# Patient Record
Sex: Female | Born: 1935 | Race: White | Hispanic: No | Marital: Married | Smoking: Never smoker
Health system: Southern US, Community
[De-identification: ages and names within clinical notes are randomized; demographics above are authoritative.]

## PROBLEM LIST (undated history)

## (undated) DIAGNOSIS — I5032 Chronic diastolic (congestive) heart failure: Secondary | ICD-10-CM

## (undated) DIAGNOSIS — I341 Nonrheumatic mitral (valve) prolapse: Secondary | ICD-10-CM

## (undated) DIAGNOSIS — M1990 Unspecified osteoarthritis, unspecified site: Secondary | ICD-10-CM

## (undated) DIAGNOSIS — K219 Gastro-esophageal reflux disease without esophagitis: Secondary | ICD-10-CM

## (undated) DIAGNOSIS — I440 Atrioventricular block, first degree: Secondary | ICD-10-CM

## (undated) DIAGNOSIS — M5136 Other intervertebral disc degeneration, lumbar region: Secondary | ICD-10-CM

## (undated) DIAGNOSIS — I340 Nonrheumatic mitral (valve) insufficiency: Secondary | ICD-10-CM

## (undated) DIAGNOSIS — E785 Hyperlipidemia, unspecified: Secondary | ICD-10-CM

## (undated) DIAGNOSIS — M51369 Other intervertebral disc degeneration, lumbar region without mention of lumbar back pain or lower extremity pain: Secondary | ICD-10-CM

## (undated) DIAGNOSIS — J449 Chronic obstructive pulmonary disease, unspecified: Secondary | ICD-10-CM

## (undated) HISTORY — DX: Nonrheumatic mitral (valve) insufficiency: I34.0

## (undated) HISTORY — PX: ABDOMINAL HYSTERECTOMY: SHX81

## (undated) HISTORY — PX: TONSILLECTOMY: SUR1361

## (undated) HISTORY — DX: Nonrheumatic mitral (valve) prolapse: I34.1

## (undated) HISTORY — DX: Other intervertebral disc degeneration, lumbar region: M51.36

## (undated) HISTORY — DX: Other intervertebral disc degeneration, lumbar region without mention of lumbar back pain or lower extremity pain: M51.369

## (undated) HISTORY — DX: Unspecified osteoarthritis, unspecified site: M19.90

## (undated) HISTORY — DX: Chronic obstructive pulmonary disease, unspecified: J44.9

## (undated) HISTORY — DX: Gastro-esophageal reflux disease without esophagitis: K21.9

## (undated) HISTORY — PX: DILATION AND CURETTAGE OF UTERUS: SHX78

## (undated) HISTORY — DX: Chronic diastolic (congestive) heart failure: I50.32

## (undated) HISTORY — DX: Atrioventricular block, first degree: I44.0

## (undated) HISTORY — PX: APPENDECTOMY: SHX54

## (undated) HISTORY — PX: OTHER SURGICAL HISTORY: SHX169

## (undated) HISTORY — DX: Hyperlipidemia, unspecified: E78.5

## (undated) HISTORY — PX: HYSTEROTOMY: SHX1776

---

## 2005-01-30 ENCOUNTER — Ambulatory Visit: Payer: Self-pay | Admitting: Family Medicine

## 2005-08-06 ENCOUNTER — Ambulatory Visit: Payer: Self-pay | Admitting: Cardiology

## 2005-08-21 ENCOUNTER — Ambulatory Visit: Payer: Self-pay

## 2005-09-06 ENCOUNTER — Ambulatory Visit: Payer: Self-pay | Admitting: Cardiology

## 2006-02-19 ENCOUNTER — Ambulatory Visit: Payer: Self-pay | Admitting: Family Medicine

## 2006-04-08 ENCOUNTER — Ambulatory Visit: Payer: Self-pay | Admitting: Cardiology

## 2006-05-09 ENCOUNTER — Ambulatory Visit: Payer: Self-pay | Admitting: Cardiology

## 2006-10-22 ENCOUNTER — Ambulatory Visit: Payer: Self-pay | Admitting: Cardiology

## 2006-11-18 ENCOUNTER — Ambulatory Visit: Payer: Self-pay | Admitting: Family Medicine

## 2006-11-22 ENCOUNTER — Ambulatory Visit: Payer: Self-pay

## 2007-04-29 ENCOUNTER — Ambulatory Visit: Payer: Self-pay | Admitting: Cardiology

## 2007-05-06 ENCOUNTER — Ambulatory Visit: Payer: Self-pay | Admitting: Family Medicine

## 2008-01-22 ENCOUNTER — Ambulatory Visit: Payer: Self-pay | Admitting: Cardiology

## 2008-02-03 ENCOUNTER — Encounter: Payer: Self-pay | Admitting: Cardiology

## 2008-02-03 ENCOUNTER — Ambulatory Visit: Payer: Self-pay

## 2008-02-17 ENCOUNTER — Ambulatory Visit: Payer: Self-pay | Admitting: Cardiology

## 2008-05-27 ENCOUNTER — Ambulatory Visit: Payer: Self-pay | Admitting: Family Medicine

## 2008-05-31 ENCOUNTER — Emergency Department: Payer: Self-pay | Admitting: Internal Medicine

## 2008-09-28 ENCOUNTER — Ambulatory Visit: Payer: Self-pay | Admitting: Cardiology

## 2008-10-19 ENCOUNTER — Ambulatory Visit: Payer: Self-pay

## 2008-10-19 ENCOUNTER — Encounter: Payer: Self-pay | Admitting: Cardiology

## 2009-04-12 ENCOUNTER — Ambulatory Visit: Payer: Self-pay | Admitting: Cardiology

## 2009-04-12 DIAGNOSIS — I080 Rheumatic disorders of both mitral and aortic valves: Secondary | ICD-10-CM | POA: Insufficient documentation

## 2009-04-12 DIAGNOSIS — K219 Gastro-esophageal reflux disease without esophagitis: Secondary | ICD-10-CM | POA: Insufficient documentation

## 2009-04-12 DIAGNOSIS — I059 Rheumatic mitral valve disease, unspecified: Secondary | ICD-10-CM

## 2009-04-12 DIAGNOSIS — I319 Disease of pericardium, unspecified: Secondary | ICD-10-CM | POA: Insufficient documentation

## 2009-08-09 ENCOUNTER — Encounter (INDEPENDENT_AMBULATORY_CARE_PROVIDER_SITE_OTHER): Payer: Self-pay | Admitting: *Deleted

## 2009-09-13 ENCOUNTER — Ambulatory Visit: Payer: Self-pay | Admitting: Family Medicine

## 2009-10-06 ENCOUNTER — Ambulatory Visit: Payer: Self-pay | Admitting: Family Medicine

## 2009-11-15 ENCOUNTER — Ambulatory Visit: Payer: Self-pay | Admitting: Cardiology

## 2009-11-15 DIAGNOSIS — R0602 Shortness of breath: Secondary | ICD-10-CM | POA: Insufficient documentation

## 2009-12-06 ENCOUNTER — Encounter: Payer: Self-pay | Admitting: Nurse Practitioner

## 2009-12-13 ENCOUNTER — Ambulatory Visit: Payer: Self-pay

## 2009-12-13 ENCOUNTER — Ambulatory Visit (HOSPITAL_COMMUNITY): Admission: RE | Admit: 2009-12-13 | Discharge: 2009-12-13 | Payer: Self-pay | Admitting: Cardiology

## 2009-12-13 ENCOUNTER — Ambulatory Visit: Payer: Self-pay | Admitting: Cardiovascular Disease

## 2009-12-13 ENCOUNTER — Encounter: Payer: Self-pay | Admitting: Cardiology

## 2009-12-19 ENCOUNTER — Telehealth: Payer: Self-pay | Admitting: Cardiology

## 2009-12-21 ENCOUNTER — Ambulatory Visit: Payer: Self-pay | Admitting: Gastroenterology

## 2010-01-03 ENCOUNTER — Encounter: Payer: Self-pay | Admitting: Nurse Practitioner

## 2010-01-12 ENCOUNTER — Ambulatory Visit: Payer: Self-pay | Admitting: Gastroenterology

## 2010-01-12 ENCOUNTER — Encounter: Payer: Self-pay | Admitting: Gastroenterology

## 2010-01-24 ENCOUNTER — Ambulatory Visit: Payer: Self-pay | Admitting: Gastroenterology

## 2010-01-24 ENCOUNTER — Encounter: Payer: Self-pay | Admitting: Nurse Practitioner

## 2010-01-27 ENCOUNTER — Encounter: Payer: Self-pay | Admitting: Nurse Practitioner

## 2010-02-02 ENCOUNTER — Ambulatory Visit: Payer: Self-pay | Admitting: Gastroenterology

## 2010-02-02 ENCOUNTER — Encounter: Payer: Self-pay | Admitting: Nurse Practitioner

## 2010-03-07 ENCOUNTER — Encounter: Payer: Self-pay | Admitting: Nurse Practitioner

## 2010-05-15 ENCOUNTER — Encounter: Payer: Self-pay | Admitting: Cardiology

## 2010-05-16 ENCOUNTER — Ambulatory Visit: Payer: Self-pay | Admitting: Cardiology

## 2010-05-16 DIAGNOSIS — R1011 Right upper quadrant pain: Secondary | ICD-10-CM | POA: Insufficient documentation

## 2010-05-17 ENCOUNTER — Telehealth (INDEPENDENT_AMBULATORY_CARE_PROVIDER_SITE_OTHER): Payer: Self-pay

## 2010-05-17 ENCOUNTER — Encounter: Payer: Self-pay | Admitting: Nurse Practitioner

## 2010-05-25 ENCOUNTER — Telehealth: Payer: Self-pay | Admitting: Nurse Practitioner

## 2010-05-30 ENCOUNTER — Ambulatory Visit: Payer: Self-pay | Admitting: Nurse Practitioner

## 2010-06-06 ENCOUNTER — Ambulatory Visit (HOSPITAL_COMMUNITY): Admission: RE | Admit: 2010-06-06 | Discharge: 2010-06-06 | Payer: Self-pay | Admitting: Gastroenterology

## 2010-06-14 ENCOUNTER — Ambulatory Visit: Payer: Self-pay | Admitting: Gastroenterology

## 2010-06-30 ENCOUNTER — Telehealth (INDEPENDENT_AMBULATORY_CARE_PROVIDER_SITE_OTHER): Payer: Self-pay | Admitting: *Deleted

## 2010-10-04 ENCOUNTER — Ambulatory Visit: Payer: Self-pay | Admitting: Ophthalmology

## 2010-10-17 ENCOUNTER — Ambulatory Visit: Payer: Self-pay

## 2010-10-17 ENCOUNTER — Ambulatory Visit (HOSPITAL_COMMUNITY): Admission: RE | Admit: 2010-10-17 | Discharge: 2010-10-17 | Payer: Self-pay | Admitting: Cardiology

## 2010-10-17 ENCOUNTER — Encounter: Payer: Self-pay | Admitting: Cardiology

## 2010-10-17 ENCOUNTER — Ambulatory Visit: Payer: Self-pay | Admitting: Cardiology

## 2010-11-07 ENCOUNTER — Ambulatory Visit: Payer: Self-pay | Admitting: Family Medicine

## 2011-01-09 NOTE — Assessment & Plan Note (Signed)
Summary: 6 month rov need echo sameday/sl   Visit Type:  Follow-up Referring Provider:  na Primary Provider:  Cay Schillings, MD  CC:  MVP and MR.  History of Present Illness: The patient presents for followup of mitral valve prolapse with regurgitation. She had an echo done this morning which demonstrates an EF of 50-55% with mild to moderate regurgitation and moderate prolapse. She denies any symptoms such as shortness of breath, PND or orthopnea. She has no palpitations, presyncope or syncope. He denies any chest pressure, neck or arm discomfort. She has had no weight gain or edema. She does try to stay active and walks for exercise.  Current Medications (verified): 1)  Aspirin Ec 325 Mg Tbec (Aspirin) .... Take One Tablet By Mouth Daily 2)  Metoprolol Tartrate 25 Mg Tabs (Metoprolol Tartrate) .... Take 1/2 Tab  Two Times A Day 3)  Fosamax 70 Mg Tabs (Alendronate Sodium) .... Take Once A Week 4)  Oyster Calcium 500 Mg Tabs (Oyster Shell) .... One Tablet By Mouth Two Times A Day 5)  Multivitamins   Tabs (Multiple Vitamin) .Marland Kitchen.. 1 Po Daily  Allergies (verified): 1)  ! Penicillin V Potassium (Penicillin V Potassium)  Past History:  Past Medical History: Mitral prolapse with mild mild-to-moderate mitral  regurgitation Gastroesophageal reflux disease,  Pericardial effusion in the past (none on the recent echo) Miild osteoporosis.  Arthritis  Past Surgical History: Reviewed history from 04/12/2009 and no changes required. D&C, Right eye surgery Partial hysterectomy Right breast biopsy Tonsilectomy  Review of Systems       As stated in the HPI and negative for all other systems.   Vital Signs:  Patient profile:   77 year old female Height:      60 inches Weight:      103 pounds BMI:     20.19 Pulse rate:   53 / minute Resp:     16 per minute BP sitting:   112 / 68  (right arm)  Vitals Entered By: Marrion Coy, CNA (October 17, 2010 8:42 AM)  Physical Exam  General:   Well developed, well nourished, no acute distress. Head:  Normocephalic and atraumatic. Neck:  Large, soft lesion left neck with consistency of lipoma  Chest Wall:  no deformities or breast masses noted Lungs:  Clear throughout to auscultation. Abdomen:  Abdomen soft, nontender, nondistended. No obvious masses or hepatomegaly.Normal bowel sounds.  Msk:  Symmetrical with no gross deformities. Normal posture, scoliosis Extremities:  No clubbing or cyanosis. Neurologic:  Alert and oriented x 3. Cervical Nodes:  no significant adenopathy Psych:  Normal affect.   Detailed Cardiovascular Exam  Neck    Carotids: Carotids full and equal bilaterally without bruits.      Neck Veins: Normal, no JVD.    Heart    Inspection: no deformities or lifts noted.      Palpation: normal PMI with no thrills palpable.      Auscultation: S1 and S2 within normal limits, no S3, no S4, 3/6 systolic murmur at the apex and radiating to the axilla, holosystolic, mitral valve prolapse click, no rubs  Vascular    Abdominal Aorta: no palpable masses, pulsations, or audible bruits.      Femoral Pulses: normal femoral pulses bilaterally.      Pedal Pulses: pulses normal in all 4 extremities    Radial Pulses: normal radial pulses bilaterally.      Peripheral Circulation: no clubbing, cyanosis, or edema noted with normal capillary refill.  EKG  Procedure date:  10/17/2010  Findings:      Sinus rhythm, rate 57, left anterior fascicular block, poor anterior R-wave progression  Impression & Recommendations:  Problem # 1:  MITRAL VALVE PROLAPSE (ICD-424.0) Her prolapse seems to be unchanged. Her EF is upper limits of normal. The regurgitation is unchanged. She has no symptoms. I will follow this in one year.  Problem # 2:  PERICARDIAL EFFUSION (ICD-423.9) There is again no evidence of this on this exam. No further evaluation is needed.  Other Orders: EKG w/ Interpretation (93000) Echocardiogram  (Echo)  Patient Instructions: 1)  Your physician recommends that you schedule a follow-up appointment in: 12 months withDr Ermias Tomeo 2)  Your physician recommends that you continue on your current medications as directed. Please refer to the Current Medication list given to you today. 3)  Your physician has requested that you have an echocardiogram in one year.  Echocardiography is a painless test that uses sound waves to create images of your heart. It provides your doctor with information about the size and shape of your heart and how well your heart's chambers and valves are working.  This procedure takes approximately one hour. There are no restrictions for this procedure.

## 2011-01-09 NOTE — Procedures (Signed)
Summary: ERCP/[facility]  ERCP/[facility]   Imported By: Sherian Rein 06/19/2010 08:04:02  _____________________________________________________________________  External Attachment:    Type:   Image     Comment:   External Document

## 2011-01-09 NOTE — Progress Notes (Signed)
  Phone Note Other Incoming   Request: Send information Summary of Call: Records received from [facility]. 24 pages forwarded to Dr. Christella Hartigan for review.

## 2011-01-09 NOTE — Progress Notes (Signed)
Summary: ? re if old records were faxed  Phone Note Call from Patient Call back at [facility] Phone [phone]   Caller: Patient Call For: Myan Suit Reason for Call: Talk to Nurse Summary of Call: Patient wants to know if we received her old records so the it can be reviewed before her appt Tues. with Gunnar Fusi Initial call taken by: Tawni Levy,  May 25, 2010 10:50 AM  Follow-up for Phone Call        Called pt to find out what records she was referring to she states that she has a GI MD at [facility] clinic and she was coming here for a second opinion and she wanted to know if we have recieved her GI records. I asked when the last time she has seen the GI MD at [facility] she told me she saw him 2-3 months ago but wanted to see what another MD would say about her. I advised her that we do not give second opioions and that if she has seen anothe GI MD within a year our practice policy was to have all of the previous GI records faxed over to our GI MD for review and once he has reviewed them we will give her a call back about setting up the soonest appt if her accepts her into the practice. I also advised her I was cxing her apt with Gunnar Fusi and we would make sure to give her the soonest appt we could if she is accepted into the practice.  Follow-up by: Harlow Mares CMA Duncan Dull),  May 25, 2010 5:18 PM     Appended Document: ? re if old records were faxed called pt back and added back to Laruth Hanger guenthers schedule oked per joan. advised pt we have not yet got recoreds she should call [facility] and have them faxed to [phone] before her appt  Appended Document: ? re if old records were faxed Spoke with Pt this am she was asking for me to fax Records over to [facility], I advised her we do not have to fax records over to [facility], per the Phone note from 6/16 the records were supposed to come form the GI MD @ [facility]..before they will accept her onto the Dept.I advised Pt to call [facility] and ask them to fax her records over so she can make an appt.Pt hung up phone.    Appended Document: ? re if old records were faxed Recieved Records from [facility] today, called and spoke with Britta Mccreedy on GI I faxed the records over to her. @ [phone]..so they can make PT appt. also kept copy here will forward to [place].

## 2011-01-09 NOTE — Letter (Signed)
Summary: [facility]   Imported By: Sherian Rein 06/19/2010 08:15:38  _____________________________________________________________________  External Attachment:    Type:   Image     Comment:   External Document

## 2011-01-09 NOTE — Letter (Signed)
Summary: New Patient letter  [facility]  [address] [place], [place] [address]   Phone: [phone]  Fax: [phone]       05/17/2010 MRN: [id number]  [facility] [address] [place], [place]  [address]  Dear Ms. Roehrig,  Welcome to the Gastroenterology Division at [facility].    You are scheduled to see Willette Cluster RNP  on 05/30/10 at 1:30 on the 3rd floor at [facility], [address]  We ask that you try to arrive at our office 15 minutes prior to your appointment time to allow for check-in.  We would like you to complete the enclosed self-administered evaluation form prior to your visit and bring it with you on the day of your appointment.  We will review it with you.  Also, please bring a complete list of all your medications or, if you prefer, bring the medication bottles and we will list them.  Please bring your insurance card so that we may make a copy of it.  If your insurance requires a referral to see a specialist, please bring your referral form from your primary care physician.  Co-payments are due at the time of your visit and may be paid by cash, check or credit card.     Your office visit will consist of a consult with your physician (includes a physical exam), any laboratory testing he/she may order, scheduling of any necessary diagnostic testing (e.g. x-ray, ultrasound, CT-scan), and scheduling of a procedure (e.g. Endoscopy, Colonoscopy) if required.  Please allow enough time on your schedule to allow for any/all of these possibilities.    If you cannot keep your appointment, please call [phone] to cancel or reschedule prior to your appointment date.  This allows [place] the opportunity to schedule an appointment for another patient in need of care.  If you do not cancel or reschedule by 5 p.m. the business day prior to your appointment date, you will be charged a $50.00 late cancellation/no-show fee.    Thank you for choosing [facility] for your medical needs.  We appreciate the opportunity to care for you.  Please visit [place] at our website  to learn more about our practice.                     Sincerely,                                                             The Gastroenterology Division

## 2011-01-09 NOTE — Miscellaneous (Signed)
  Clinical Lists Changes  Observations: Added new observation of ECHOINTERP:  - Left ventricle: The cavity size was normal. Wall thickness was       normal. Systolic function was normal. The estimated ejection       fraction was in the range of 55% to 60%. Wall motion was normal;       there were no regional wall motion abnormalities. Left ventricular       diastolic function parameters were normal.     - Aortic valve: Trivial regurgitation.     - Mitral valve: Moderate prolapse, involving the anterior leaflet       and the posterior leaflet. Moderate regurgitation.     - Left atrium: The atrium was moderately dilated.     - Right ventricle: The cavity size was mildly dilated.     - Right atrium: The atrium was moderately dilated. (12/13/2009 8:43)      Echocardiogram  Procedure date:  12/13/2009  Findings:       - Left ventricle: The cavity size was normal. Wall thickness was       normal. Systolic function was normal. The estimated ejection       fraction was in the range of 55% to 60%. Wall motion was normal;       there were no regional wall motion abnormalities. Left ventricular       diastolic function parameters were normal.     - Aortic valve: Trivial regurgitation.     - Mitral valve: Moderate prolapse, involving the anterior leaflet       and the posterior leaflet. Moderate regurgitation.     - Left atrium: The atrium was moderately dilated.     - Right ventricle: The cavity size was mildly dilated.     - Right atrium: The atrium was moderately dilated.

## 2011-01-09 NOTE — Assessment & Plan Note (Signed)
Summary: abdominal pain Second opinion/lk   History of Present Illness Visit Type: Initial Consult Primary GI MD: Rob Bunting MD Primary Provider: Dr. Cay Schillings Requesting Provider: Rollene Rotunda, MD Chief Complaint: Rt. sided abdominal pain, second opinion History of Present Illness:   Jennifer Skinner is here with her husband for a second opinion regarding RUQ pain. In December 2010 patient saw Dr. Lutricia Feil at the [facility] in [place]. She subsequently had an EGD and colonoscopy for evaluation of abdominal pain and change in bowel habits. EGD normal. Colonoscopy pertinent for a sessile cecal polyp and multiple diverticula in sigmoid. For further workup patient underwent U/S of abdomen which was normal. Labs revealed mildly elevated Amylase and Lipase of 151 and 56, respectively. LFTs and CBC were normal.  CTscan without contrast suggested two hepatic hemangiomas ( I don't have actual report). For persistent pain and mildly elevated pancreatic enzymes patient had MRCP revealing CBD dilation and pancreatic duct dilation. No definite ampullary mass but there was abrupt termination of CBD and pancreatic ducts. Gallbladder distended, indeterminate nodules scattered throughtout the liver. 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 Bilary tree swept with balloon but nothing found. Sphincterotomy was done.  Patient still continues to have abdominal discomfort, mainly RUQ but sometimes across right upper abdomen.  She has post-prandial bloating as well. Recently had problems with constipation as well but that has improved with stool softeners. Patient wondered whether stool softeners were causing her pain so she stopped them for three days with no improvement in pain. No nausea except that associated with motion sickness. No weight loss. No dark urine or pruritis.  Her pain is not severe, but patient feels like something isn't right.  Marland Kitchen    GI Review of Systems    Reports abdominal pain and  bloating.     Location of  Abdominal pain: right side.    Denies acid reflux, belching, chest pain, dysphagia with liquids, dysphagia with solids, heartburn, loss of appetite, nausea, vomiting, vomiting blood, weight loss, and  weight gain.      Reports change in bowel habits and  constipation.     Denies anal fissure, black tarry stools, diarrhea, diverticulosis, fecal incontinence, heme positive stool, hemorrhoids, irritable bowel syndrome, jaundice, light color stool, liver problems, rectal bleeding, and  rectal pain. Preventive Screening-Counseling & Management      Drug Use:  no.      Current Medications (verified): 1)  Aspirin Ec 325 Mg Tbec (Aspirin) .... Take One Tablet By Mouth Daily 2)  Metoprolol Tartrate 25 Mg Tabs (Metoprolol Tartrate) .... Take 1/2 Tab  Two Times A Day 3)  Fosamax 70 Mg Tabs (Alendronate Sodium) .... Take Once A Week 4)  Oyster Calcium 500 Mg Tabs (Oyster Shell) .Marland Kitchen.. 1 Podaily 5)  Multivitamins   Tabs (Multiple Vitamin) .Marland Kitchen.. 1 Po Daily  Allergies (verified): 1)  ! Penicillin V Potassium (Penicillin V Potassium)  Past History:  Past Medical History: Mitral prolapse with mild mild-to-moderate mitral  regurgitation Gastroesophageal reflux disease,  Pericardial effusion in the past (none on the recent echo) Miild osteoporosis.     Arthritis  Past Surgical History: Reviewed history from 04/12/2009 and no changes required. D&C, Right eye surgery Partial hysterectomy Right breast biopsy Tonsilectomy  Family History: Reviewed history from 04/12/2009 and no changes required. Father: MI @ 45 Mother: CVA @ 28  Social History: Retired  Married  2 children Tobacco Use - No.  Alcohol Use -  no Daily Caffeine Use Illicit Drug Use - no Drug Use:  no  Review of Systems       The patient complains of arthritis/joint pain and shortness of breath.  The patient denies allergy/sinus, anemia,  anxiety-new, back pain, blood in urine, breast changes/lumps, change in vision, confusion, cough, coughing up blood, depression-new, fainting, fatigue, fever, headaches-new, hearing problems, heart murmur, heart rhythm changes, itching, menstrual pain, muscle pains/cramps, night sweats, nosebleeds, pregnancy symptoms, skin rash, sleeping problems, sore throat, swelling of feet/legs, swollen lymph glands, thirst - excessive , urination - excessive , urination changes/pain, urine leakage, vision changes, and voice change.    Vital Signs:  Patient profile:   77 year old female Height:      60 inches Weight:      101.25 pounds BMI:     19.85 Pulse rate:   60 / minute Pulse rhythm:   regular BP sitting:   102 / 60  (left arm) Cuff size:   regular  Vitals Entered By: Jennifer Skinner CMA Duncan Dull) (Jennifer 21, 2011 1:54 PM)  Physical Exam  General:  Well developed, well nourished, no acute distress. Head:  Normocephalic and atraumatic. Eyes:  Conjunctiva pink, no icterus.  Mouth:  No oral lesions. Tongue moist.  Neck:  Large, soft lesion left neck with consistency of lipoma  Lungs:  Clear throughout to auscultation. Heart:  Regular rate and rhythm; no murmurs, rubs,  or bruits. Abdomen:  Abdomen soft, nontender, nondistended. No obvious masses or hepatomegaly.Normal bowel sounds.  Msk:  Symmetrical with no gross deformities. Normal posture. Extremities:  No palmar erythema, no edema.  Neurologic:  Alert and  oriented x4;  grossly normal neurologically. Skin:  Intact without significant lesions or rashes. Cervical Nodes:  No significant cervical adenopathy. Psych:  Alert and cooperative. Normal mood and affect.   Impression & Recommendations:  Problem # 1:  RUQ PAIN (ICD-789.01) Assessment Unchanged Chronic (since at least Dec. 2010). Patient has had extensive workup by Dr. Bluford Kaufmann in [place] (see HPI). 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 She continues to have this nagging RUQ discomfort.   Dr. Antoine Poche referred patient is here for a second opinion. She was adamant about being seen in clinic today but our gastroenterologists had no available appointments.  I reviewed records from [facility] and discussed case with Dr. Christella Hartigan. Will obtain MRI of the pancreas and have patient return for follow up with Dr. Christella Hartigan to discuss MRI results and plan of treatment.   Orders: MRI Abdomen (MRI Abdomen)    Problem # 2:  NONSPECIFIC ABN FINDNG RAD&OTH EXAM BILARY TRCT (ICD-793.3) Assessment: Comment Only See above.  Other Orders: TLB-Creatinine, Blood (82565-CREA) TLB-BUN (Urea Nitrogen) (84520-BUN)  Patient Instructions: 1)  We have schedueld the MRI at [facility] on 06-06-10 at [facility]. 2)  Directions given. 3)  Please go to the lab, basement level. 4)  We will call you with the MRI results when we have them.

## 2011-01-09 NOTE — Letter (Signed)
Summary: [facility]   Imported By: Sherian Rein 06/19/2010 08:14:02  _____________________________________________________________________  External Attachment:    Type:   Image     Comment:   External Document

## 2011-01-09 NOTE — Assessment & Plan Note (Signed)
Summary: per check out/sf   Visit Type:  Follow-up Primary Provider:  Dr. Cay Schillings  CC:  MVP.  History of Present Illness: The patient presents for followup of the above. She had an echocardiogram in December demonstrating mitral valve prolapse with moderate regurgitation unchanged from previous. She's had no new cardiovascular complaints. She doesn't have any palpitations, presyncope or syncope. She is not having any chest pressure, neck or arm discomfort. She has no PND or orthopnea or dyspnea on exertion though she occasionally feels like she has trouble taking a deep breath. She does complain of her right upper quadrant discomfort that has been persistent in going on for months. She reports an having had a GI evaluation with another group but having no clear diagnosis. Probably most concerned and is stressed by this discomfort.  Current Medications (verified): 1)  Aspirin Ec 325 Mg Tbec (Aspirin) .... Take One Tablet By Mouth Daily 2)  Metoprolol Tartrate 25 Mg Tabs (Metoprolol Tartrate) .... Take 1/2 Tab  Two Times A Day 3)  Fosamax 70 Mg Tabs (Alendronate Sodium) .... Take Once A Week 4)  Oyster Calcium 500 Mg Tabs (Oyster Shell) .Marland Kitchen.. 1 Podaily 5)  Multivitamins   Tabs (Multiple Vitamin) .Marland Kitchen.. 1 Po Daily  Allergies (verified): 1)  ! Penicillin V Potassium (Penicillin V Potassium)  Past History:  Past Medical History: Reviewed history from 04/12/2009 and no changes required. Mitral prolapse with mild mild-to-moderate mitral  regurgitation Gastroesophageal reflux disease,  Pericardial effusion in the past (none on the recent echo) Miild osteoporosis.      Past Surgical History: Reviewed history from 04/12/2009 and no changes required. D&C, Right eye surgery Partial hysterectomy Right breast biopsy Tonsilectomy  Review of Systems       As stated in the HPI and negative for all other systems   Vital Signs:  Patient profile:   77 year old female Height:      64  inches Weight:      101 pounds BMI:     17.40 Pulse rate:   62 / minute Resp:     16 per minute  Vitals Entered By: Marrion Coy, CNA (May 16, 2010 11:54 AM)  Serial Vital Signs/Assessments:  Time      Position  BP       Pulse  Resp  Temp     By           R Arm     122/70                         Marrion Coy, CNA           L Arm     118/68                         Marrion Coy, CNA   Physical Exam  General:  Well developed, well nourished, in no acute distress. Head:  normocephalic and atraumatic Eyes:  PERRLA/EOM intact; conjunctiva and lids normal. Mouth:  Teeth, gums and palate normal. Oral mucosa normal. Neck:  Neck supple, no JVD. No masses, thyromegaly or abnormal cervical nodes, soft 6 x 5 left anterior neck mass Chest Wall:  no deformities or breast masses noted Lungs:  Clear bilaterally to auscultation and percussion. Abdomen:  Bowel sounds positive; abdomen soft and non-tender without masses, organomegaly, or hernias noted. No hepatosplenomegaly. Msk:  Back normal, normal gait. Muscle strength and tone normal. Skin:  Intact  without lesions or rashes. Cervical Nodes:  no significant adenopathy Axillary Nodes:  no significant adenopathy Psych:  Normal affect.   Detailed Cardiovascular Exam  Neck    Carotids: Carotids full and equal bilaterally without bruits.      Neck Veins: Normal, no JVD.    Heart    Inspection: no deformities or lifts noted.      Palpation: normal PMI with no thrills palpable.      Auscultation: S1 and S2 within normal limits, no S3, no S4, 3/6 systolic murmur at the apex and radiating to the axilla, holosystolic, mitral valve prolapse click, no rubs  Vascular    Abdominal Aorta: no palpable masses, pulsations, or audible bruits.      Femoral Pulses: normal femoral pulses bilaterally.      Pedal Pulses: pulses normal in all 4 extremities    Radial Pulses: normal radial pulses bilaterally.      Peripheral Circulation: no clubbing, cyanosis,  or edema noted with normal capillary refill.     EKG  Procedure date:  05/16/2010  Findings:      Sinus rhythm, rate 60 to, right axis deviation, poor anterior R-wave progression, no acute ST-T wave change  Impression & Recommendations:  Problem # 1:  MITRAL REGURGITATION (ICD-396.3) Her recent echo demonstrated stable mitral valve prolapse and regurgitation. She understands endocarditis prophylaxis. I will repeat an echo in December.  Orders: Echocardiogram (Echo)  Problem # 2:  RUQ PAIN (ICD-789.01) She has requested a second opinion from one of our gastroenterologists. I will try to facilitate this. Orders: Gastroenterology Referral (GI)  Problem # 3:  DYSPNEA (ICD-786.05) She has some mild difficulty taking a deep breath but otherwise I think is doing well. I will make no changes to her medical regimen will suggest further testing.  Patient Instructions: 1)  Your physician recommends that you schedule a follow-up appointment in: 12/11 after Echo 2)  Your physician recommends that you continue on your current medications as directed. Please refer to the Current Medication list given to you today. 3)  Your physician has requested that you have an echocardiogram.  Echocardiography is a painless test that uses sound waves to create images of your heart. It provides your doctor with information about the size and shape of your heart and how well your heart's chambers and valves are working.  This procedure takes approximately one hour. There are no restrictions for this procedure. 4)  You have been referred to Gastroentrology for right upper quadrant pain

## 2011-01-09 NOTE — Assessment & Plan Note (Signed)
Review of gastrointestinal problems: 1. Chronic intermittent RUQ pains extensively worked up by Dr. Bluford Kaufmann in [place] in 2011: CT scan, MRCP, ERCP (biliary sphincterotomy), [place], HIDA scan normal, LFTs/CBC were normal, EGD/colonoscopy normal except for small polyp in colon.  Dr. Christella Hartigan ordered MRI abdomen that showed no pancreatic masses, +for several small liver hemangiomas, mild anorectal dilation, common bile that 7 mm.    History of Present Illness Visit Type: Follow-up Visit Primary GI MD: Rob Bunting MD Primary Provider: Cay Schillings, MD Requesting Provider: na Chief Complaint: MRI f/u  History of Present Illness:     77 year old woman who is here with her husband today.  who has had RUQ pains for at least 8-10 months.  Certain body movements can cause a pulling sensations. Intermittent but a bother every day.  Nothing reliably brings on the pain.  It can last for maybe about an hour.  Nothing reliably makes it better.  EAting does not tend to bring it on, however overeating causes a very full feeling.  She has noticed more constipation around the same time period.  She doesn't think that her symptoms are constipation related.   She is very sensitive to tight waistband.  Overall stable weight since this started.    takes daily ASA, but no other NSAIDs.              Current Medications (verified): 1)  Aspirin Ec 325 Mg Tbec (Aspirin) .... Take One Tablet By Mouth Daily 2)  Metoprolol Tartrate 25 Mg Tabs (Metoprolol Tartrate) .... Take 1/2 Tab  Two Times A Day 3)  Fosamax 70 Mg Tabs (Alendronate Sodium) .... Take Once A Week 4)  Oyster Calcium 500 Mg Tabs (Oyster Shell) .... One Tablet By Mouth Two Times A Day 5)  Multivitamins   Tabs (Multiple Vitamin) .Marland Kitchen.. 1 Po Daily  Allergies (verified): 1)  ! Penicillin V Potassium (Penicillin V Potassium)  Vital Signs:  Patient profile:   77 year old female Height:      60 inches Weight:      102 pounds BMI:     19.99 BSA:      1.40 Pulse rate:   60 / minute Pulse rhythm:   regular BP sitting:   100 / 60  (left arm) Cuff size:   regular  Vitals Entered By: Ok Anis CMA (June 14, 2010 10:18 AM)  Physical Exam  Additional Exam:  Constitutional: generally well appearing Psychiatric: alert and oriented times 3 Abdomen: soft, non-tender, non-distended, normal bowel sounds    Impression & Recommendations:  Problem # 1:  Unexplained right-sided abdominal pains she has had myriad GI testing over the past several months mainly under the care of Dr. Bluford Kaufmann.  None of these tests have explained to her right sided abdominal pains.  I explained to her that it is not clear that she has a gastrointestinal issue.  I do not think any further GI tests are warranted at this point unless any significant change in her symptoms occurs. I recommended she try proton pump inhibitor once daily to see if GERD may be playing a role. If that is not helpful after one month and I recommended daily Tylenol trial. She is very reluctant to take any medicines especially pain medicines even as minor as Tylenol for this pain.  Patient Instructions: 1)  No further GI testing is needed. 2)  Should consider taking tylenol 500mg  one to two pills twice a day. 3)  Return to PCP to consider other testing  for right sided pains. 4)  A copy of this information will be sent to Dr. Cay Schillings. 5)  A prescription for prilosec, one pill, once a day, 20-30 min before breakfast meal daily.  Need to try this for at least one month before judging if it helps. 6)  Follow up as needed with Dr. Christella Hartigan. 7)  The medication list was reviewed and reconciled.  All changed / newly prescribed medications were explained.  A complete medication list was provided to the patient / caregiver. Prescriptions: OMEPRAZOLE 20 MG  CPDR (OMEPRAZOLE) 1 each day 30 minutes before meal  #30 x 11   Entered and Authorized by:   Rachael Fee MD   Signed by:   Rachael Fee MD on  06/14/2010   Method used:   Electronically to        [facility]* (retail)       [address] [place], [place]  [address]       Ph: [phone]       Fax: [phone]   RxID:   [id number]

## 2011-01-09 NOTE — Progress Notes (Signed)
Summary: Schedule GI appt  Phone Note Outgoing Call Call back at [facility] Phone [phone]   Call placed by: Darcey Nora RN, CGRN,  May 17, 2010 10:55 AM Call placed to: Patient Summary of Call: I called to schedule an appointment for eval of her RUQ pain requested by Dr Antoine Poche.  Patient  was offered an appointment for today or one day this week, she declined.  Patient only wants to come on 05/30/10.  I have scheduled her an appointment with Willette Cluster RNP for 05/30/10 at 1:30.   Initial call taken by: Darcey Nora RN, CGRN,  May 17, 2010 10:57 AM

## 2011-01-09 NOTE — Letter (Signed)
Summary: GI/[facility]  GI/[facility]   Imported By: Sherian Rein 06/19/2010 08:06:57  _____________________________________________________________________  External Attachment:    Type:   Image     Comment:   External Document

## 2011-01-09 NOTE — Progress Notes (Signed)
Summary: results of echo  Phone Note Call from Patient Call back at Home Phone [phone]   Caller: Patient Reason for Call: Talk to Nurse, Talk to Doctor, Lab or Test Results Summary of Call: pt would like results of echo Initial call taken by: Omer Jack,  December 19, 2009 10:58 AM  Follow-up for Phone Call        pt given results Follow-up by: Meredith Staggers, RN,  December 19, 2009 11:22 AM

## 2011-04-24 NOTE — Assessment & Plan Note (Signed)
[facility]                            CARDIOLOGY OFFICE NOTE   Jennifer Skinner, Jennifer Skinner                       MRN:          [id number]  DATE:04/29/2007                            DOB:          1931-01-06    PRIMARY CARE PHYSICIAN:  Dr. Cay Schillings.   REASON FOR PRESENTATION:  Evaluate patient with mitral valve prolapse.   HISTORY OF PRESENT ILLNESS:  The patient returns for followup.  She is  77 years old.  She came today because her husband had an appointment.  She has had no problems.  She denied any palpitations.  She has not had  to take any higher dose of beta blocker.  She has had no presyncope or  syncope.  She has no shortness of breath or chest pain.   PAST MEDICAL HISTORY:  1. Mild mitral valve prolapse.  2. Gastroesophageal reflux disease.  3. Pericardial effusion in the past (none on the most recent      echocardiogram in 2006).  4. Mild mitral regurgitation.  5. Osteoporosis.   ALLERGIES:  PENICILLIN.   MEDICATIONS:  1. Aspirin 325 mg daily.  2. Fosamax 70 mg weekly.  3. Metoprolol 12.5 mg b.i.d.   REVIEW OF SYSTEMS:  As stated in the HPI.  Otherwise. negative for other  systems.   HISTORY OF PRESENT ILLNESS:  GENERAL:  The patient is in no distress.  VITAL SIGNS:  Blood pressure 109/69, heart rate 61 and regular, weight  101 pounds.  HEENT:  Eyes unremarkable.  Pupils equal, round, and reactive to light.  Fundi not visualized.  Oral mucosa unremarkable.  NECK:  No jugular venous distention.  Waveform within normal limits.  Carotid upstroke brisk and symmetric.  No bruits, no thyromegaly.  LYMPHATICS:  No adenopathy.  (She does have a soft tissue mass on her  left neck that has been evaluated.)  LUNGS:  Clear to auscultation.  BACK:  No costovertebral angle tenderness.  HEART:  PMI not displaced or sustained.  S1 and S2 within normal limits.  No S3.  Positive midsystolic click.  2/6 apical systolic murmur heard at  the axilla  and holosystolic.  No diastolic murmurs.  ABDOMEN:  Flat.  Positive bowel sounds.  Normal in frequency and pitch.  No bruits.  No rebound, no guarding.  No midline pulsatile mass, no  organomegaly.  SKIN:  No rashes, no nodules.  EXTREMITIES:  2+ pulses.  No edema.   EKG:  Rate 57, axis within normal limits, intervals within normal  limits.  No acute ST-T wave change.   ASSESSMENT AND PLAN:  1. Mitral valve prolapse.  The patient has no symptoms related to      this.  There is no suggestion that she has any increased      regurgitation.  No further cardiovascular testing is suggested.  2. Palpitations.  These are not particularly symptomatic.  She will      remain on the beta blocker.  3. Followup.  We can see her back in one year or sooner if needed.     Rollene Rotunda, MD, [facility]  Electronically Signed    JH/MedQ  DD: 04/29/2007  DT: 04/29/2007  Job #: [id number]

## 2011-04-24 NOTE — Assessment & Plan Note (Signed)
[facility] OFFICE NOTE   Jennifer Skinner, Jennifer Skinner                       MRN:          [id number]  DATE:02/17/2008                            DOB:          08/03/1931    PRIMARY CARE PHYSICIAN:  Cay Schillings, M.D.   REASON FOR PRESENTATION:  Evaluate patient with shortness of breath and  mitral valve prolapse.   HISTORY OF PRESENT ILLNESS:  The patient is 77 years old.  She added  herself back to the schedule, as she is still complaining of the above  symptoms.  I did have an echocardiogram done on February 03, 2008 to  follow up her mitral valve prolapse and regurgitation.  There was  moderate mitral valve prolapse with mild to moderate regurgitation.  The  ejection fraction was 55-60%.  There were no significant other  abnormalities.   Still, the patient gets episodic feelings like she needs to take a  deeper breath.  She is not describing chest discomfort, neck or arm  discomfort.  She is not describing PND or orthopnea.  She has occasional  palpitations, but these are not more problematic than they have been in  the past.  She does admit to feeling anxious and possibly depressed.  Her husband corroborates this.   PAST MEDICAL HISTORY:  1. Mitral valve prolapse with mild mitral regurgitation.  2. Gastroesophageal reflux disease.  3. Pericardial effusion in the past (none present on the recent echo).  4. Mild osteoporosis.   ALLERGIES:  PENICILLIN.   MEDICATIONS:  1. Aspirin 325 mg daily.  2. Fosamax 70 mg weekly.  3. Metoprolol 12.5 mg b.i.d.   REVIEW OF SYSTEMS:  As stated in the HPI, and otherwise negative for  other systems.   PHYSICAL EXAMINATION:  GENERAL:  The patient is in no distress.  VITAL SIGNS:  Blood pressure 109/66, heart rate 71 and regular, weight  101 pounds.  NECK:  No jugular distention at 45 degrees.  Carotid upstroke brisk and  symmetric.  No bruits, thyromegaly.  LYMPHATICS:  No  cervical, axillary, inguinal adenopathy.  LUNGS:  Clear to auscultation bilaterally.  BACK:  No costovertebral angle tenderness.  CHEST:  Unremarkable.  HEART:  PMI not displaced or sustained.  S1 and S2 within normal limits.  No S3.  Positive midsystolic click, 2/6 apical systolic murmur heard at  the axilla and holosystolic.  No diastolic murmurs.  ABDOMEN:  Flat, positive bowel sounds.  Normal in frequency and pitch.  No bruits, rebound, guarding.  No midline pulsatile mass, no  hepatomegaly or splenomegaly.  SKIN:  No rashes, no nodules.  EXTREMITIES:  2+ pulses, no edema.   ASSESSMENT/PLAN:  1. Dyspnea.  Again, I have discussed the patient's dyspnea with her.      I do not think this is related to the mitral regurgitation or      prolapse.  It sounds like she has difficulty, needing to sigh.  Her      husband says she is anxious during these episodes.  This may be an      element of  anxiety or depression.  The patient seems to agree with      this and wants to see Dr. Sheppard Penton to discuss therapy for this.  2. Mitral valve prolapse/regurgitation.  Will follow this clinically.  3. Palpitations.  These were infrequent, and no further therapy is      warranted.  4. Follow up.  I will see her back in 6 months or sooner if needed.     Rollene Rotunda, MD, [facility]  Electronically Signed    JH/MedQ  DD: 02/17/2008  DT: 02/18/2008  Job #: [id number]   cc:   Cay Schillings

## 2011-04-24 NOTE — Assessment & Plan Note (Signed)
[facility] OFFICE NOTE   KORRI, ASK                       MRN:          [id number]  DATE:01/22/2008                            DOB:          [birth year]    PRIMARY:  Dr. Cay Schillings.   REASON FOR PRESENTATION:  Evaluate the patient's shortness of breath and  mitral valve prolapse.   HISTORY OF PRESENT ILLNESS:  The patient is 77 years old.  She presents  for evaluation of mitral valve prolapse.  She says she had done  relatively well since I last saw her.  She does get occasional shortness  of breath.  She describes this as feeling like she cannot take a deep  breath.  She says that this is something that lasts just a very short  period of time.  Very episodic.  She says when she walks, which she does  routinely, she will get a little short of breath when she begins, but  this actually gets better as she goes along.  She does not describe any  limiting dyspnea.  She does not have any resting shortness of breath,  PND or orthopnea.  She does not describe any palpitations, presyncope or  syncope.  She do not have any chest discomfort neck discomfort arm  discomfort, activity is nausea, vomiting since diaphoresis.   PAST MEDICAL HISTORY:  Mitral prolapse with mild mitral vegetation,  central reflux disease, pericardial effusion in the past (not on most  recent echo in 2006), mild osteoporosis.   ALLERGIES:  PENICILLIN.   MEDICATIONS:  Aspirin, Fosamax 70 mg weekly, Toprol 12 and mg b.i.d.   REVIEW OF SYSTEMS:  As stated in the HPI, otherwise have other systems.   PHYSICAL EXAMINATION:  The patient is in no distress.  Blood pressure 118/64, heart rate 63 and regular, weight 102 pounds.  HEENT:  Eyelids unremarkable.  Pupils are equal, round, and reactive to  light and accommodation.  Fundi are not visualized.  Oral mucosa  unremarkable.  NECK:  No jugular venous distension 45 degrees, carotid upstroke  brisk  and symmetric, no bruits, thyromegaly.  LYMPHATICS:  No cervical, axillary, or inguinal adenopathy.  LUNGS:  Clear to auscultation bilaterally.  BACK:  No costovertebral angle tenderness.  CHEST:  Unremarkable.  HEART:  PMI not displaced or sustained, S1 and S2 within normal limits,  no S3, positive midsystolic click, 2/6 apical systolic murmur heard at  the axilla and holosystolic, no diastolic murmurs.  ABDOMEN:  Flat, positive bowel sounds normal in frequency and pitch no  bruits, rebound, guarding, no midline pulsatile mass.  No splenomegaly.  SKIN:  No rashes, no nodules.  EXTREMITIES:  2+ upper pulses, 1+ dorsalis pedis post tibialis  bilaterally, cyanosis or clubbing, edema.  NEURO:  Oriented first place, time, cranial nerves II-XII grossly  intact, motor grossly intact.   EKG sinus rhythm, rate 63, axis within normal limits, intervals within  normal limits, no acute ST-T wave changes.   ASSESSMENT/PLAN:  1. Dyspnea.  The patient has had episodes of dyspnea.  She has a  diagnosis of mitral valve prolapse with mitral regurgitation.  The      murmurs may be a little bit more prominent this time.  I doubt that      this is related.  However, is time for another echocardiogram to      rule out any worsening regurgitation.  If this is unchanged, I      suspect that she is having to sigh and that this is not something,      either demonstrating a primary cardiac or pulmonary etiology.  It      is certainly not limiting.  2. Mitral valve prolapse/regurgitation.  Endocarditis prophylaxis is      no longer indicated for this entity according to most recent      guidelines.  3. Palpitations.  The patient has had these infrequently in the past.      She is no longer bothered by these in particular.  She has not been      taking any extra doses of metoprolol.  Remain on the current dose.  4. Pericardial effusion.  The patient had this in the past but not on      recent echo.   Will evaluate this at the time of the next echo.  5. Osteoporosis per Dr. Sheppard Penton.  6. Gas after reflux disease.  There are no symptoms.  This is per Dr.      Sheppard Penton.  7. Followup.  Will see her back and 6 months or sooner if needed.     Rollene Rotunda, MD, [facility]  Electronically Signed    JH/MedQ  DD: 01/22/2008  DT: 01/23/2008  Job #: [id number]   cc:   Cay Schillings

## 2011-04-24 NOTE — Assessment & Plan Note (Signed)
[facility]                            CARDIOLOGY OFFICE NOTE   Jennifer Skinner, Jennifer Skinner                       MRN:          [id number]  DATE:09/28/2008                            DOB:          1931/02/12    PRIMARY CARE PHYSICIAN:  Dr. Cay Schillings.   REASON FOR PRESENTATION:  Evaluate the patient with mitral valve  prolapse.   HISTORY OF PRESENT ILLNESS:  This patient is 77 years old.  She presents  for followup of the above.  Since I last saw her she has done well.  She  has had no new complaints of shortness of breath.  She has had no  palpitation, presyncope, or syncope.  She has had no chest pain.  She  denies any PND or orthopnea.   PAST MEDICAL HISTORY:  Mitral prolapse with mild mild-to-moderate mitral  regurgitation, gastroesophageal reflux disease, pericardial effusion in  the past (non on the recent echo), mild osteoporosis.   ALLERGIES:  PENICILLIN.   MEDICATIONS:  1. Aspirin 325 mg daily.  2. Fosamax 70 mg weekly.  3. Metoprolol 12.5 mg b.i.d.  4. Vitamin D.  5. Calcium.   REVIEW OF SYSTEMS:  As stated in the HPI and otherwise negative for  other systems.   PHYSICAL EXAMINATION:  GENERAL:  The patient is pleasant and in no  distress.  VITAL SIGNS:  Blood pressure are 100/50, heart rate 57 and regular,  weight 102 pounds.  HEENT:  Eyelids are unremarkable, pupils are equal, round and reactive  to light, fundi not visualized, oral mucosa unremarkable.  NECK:  Carotid upstroke brisk and symmetric, no bruits, no thyromegaly,  there is a soft mass on the left side that has been followed.  LYMPHATICS:  No cervical, axillary, or inguinal adenopathy.  LUNGS:  Clear to auscultation bilaterally.  BACK:  No costovertebral mass.  CHEST:  Unremarkable.  HEART:  PMI not displaced or sustained, S1 and S2 within normal limits.  No S3, no S4, mid systolic click, 3/6 apical systolic murmur heard at  the axilla following the click, no diastolic  murmurs.  ABDOMEN:  Flat, positive bowel sounds normal in frequency and pitch, no  bruits, no rebound, no guarding or midline pulsatile mass.  No  hepatomegaly, no splenomegaly.  SKIN:  No rashes, no nodules.  EXTREMITIES:  2+ pulse, no edema.   EKG sinus bradycardia, rate 57, axis within normals, intervals within  normal limits, no acute ST-wave changes.   ASSESSMENT AND PLAN:  1. Mitral prolapse.  The patient does have prolapse with some mild-to-      moderate regurgitation.  The murmur is unchanged.  The click is      unchanged.  We have discussed this anatomy.  I will get an      echocardiogram again in February which will be 1 year from the last      one.  At this point, no further cardiovascular sting is suggested.  2. Palpitations.  These are not bothering her.  No further therapy is      warranted.  3. Followup.  I will see  her in 6 months.  We will probably get the      echo at that same time for convenience sake.     Rollene Rotunda, MD, [facility]  Electronically Signed    JH/MedQ  DD: 09/28/2008  DT: 09/28/2008  Job #: [id number]   cc:   Cay Schillings

## 2011-04-27 NOTE — Assessment & Plan Note (Signed)
[facility]                              CARDIOLOGY OFFICE NOTE   DANALY, BARI                       MRN:          [id number]  DATE:10/22/2006                            DOB:          1931-09-08    PRIMARY:  Dr. Cay Schillings   REASON FOR PRESENTATION:  Evaluate patient with mitral valve prolapse.   HISTORY OF PRESENT ILLNESS:  The patient returns for followup.  Since I last  saw her she has done relatively well.  She has a couple of episodes of  having difficulty taking a deep breath.  However, she can walk an hour  without shortness of breath.  She denies any PND or orthopnea.  She is not  noticing any palpitations, presyncope or syncope.  We did an exercise  treadmill test in May and she did very well with this.  She has not needed  to take a higher dose of metoprolol as we had discussed in the past.   PAST MEDICAL HISTORY:  1. Mild mitral valve prolapse.  2. Gastroesophageal reflux disease.  3. Pericardial effusion in the past.  4. Mild mitral regurgitation.  5. Osteoporosis.   ALLERGIES:  PENICILLIN.   MEDICATIONS:  1. Metoprolol 12.5 mg b.i.d.  2. Fosamax 70 mg a week.  3. Aspirin 325 mg daily.   REVIEW OF SYSTEMS:  As stated in the HPI and otherwise negative for other  systems.   PHYSICAL EXAMINATION:  GENERAL:  The patient is in no acute distress.  VITAL SIGNS:  Blood pressure 118/72, heart rate 59 and regular, weight 99  pounds.  HEENT:  Eyelids unremarkable.  Pupils equal, round, and react to light.  Fundi not visualized.  NECK:  The patient does have a soft 4 x 5 cm submandibular swelling.  LYMPHATICS:  No adenopathy.  LUNGS:  Clear to auscultation bilaterally.  BACK:  No costovertebral angle tenderness.  CHEST:  Unremarkable.  HEART:  PMI not displaced or sustained.  S1 and S2 within normal limits.  No  S3, no S4, mid systolic click, 2/6 apical systolic murmur heard also to the  axilla and slightly holosystolic.  No  diastolic murmurs.  ABDOMEN:  Flat, bowel sounds normal in frequency pitch.  No bruits, no  rebound, no guarding.  No midline pulsatile mass.  No organomegaly.  SKIN:  No rashes, no nodules.  EXTREMITIES:  Show 2+ pulses, no edema.   ASSESSMENT AND PLAN:  1. Mitral valve prolapse.  The patient had an echocardiogram last year.      There has been no changed in her physical exam.  No further      cardiovascular testing is suggested.  She no longer needs endocarditis      prophylaxis based on the new recommendations.  2. Neck swelling.  The patient will go and see her primary care doctor to      evaluate this.  3. Followup.  Will see the patient back in 12 months or sooner if needed.     Rollene Rotunda, MD, [facility]  Electronically Signed    JH/MedQ  DD: 10/22/2006  DT: 10/22/2006  Job #: [id number]   cc:   Cay Schillings

## 2011-06-07 ENCOUNTER — Emergency Department: Payer: Self-pay | Admitting: Emergency Medicine

## 2011-11-04 ENCOUNTER — Emergency Department: Payer: Self-pay | Admitting: *Deleted

## 2012-01-22 ENCOUNTER — Encounter: Payer: Self-pay | Admitting: Cardiology

## 2012-01-22 ENCOUNTER — Ambulatory Visit (INDEPENDENT_AMBULATORY_CARE_PROVIDER_SITE_OTHER): Payer: Medicare Other | Admitting: Cardiology

## 2012-01-22 DIAGNOSIS — R0602 Shortness of breath: Secondary | ICD-10-CM

## 2012-01-22 DIAGNOSIS — I059 Rheumatic mitral valve disease, unspecified: Secondary | ICD-10-CM

## 2012-01-22 NOTE — Progress Notes (Signed)
   HPI The patient returns for one-year followup. She has mitral valve prolapse and regurgitation. Her last echo in November 2011 demonstrated moderate prolapse with moderate central regurgitation. Since that time she's had no new symptoms. She does some walking when the weather allows. She does her household chores. The patient denies any new symptoms such as chest discomfort, neck or arm discomfort. There has been no new shortness of breath, PND or orthopnea. There have been no reported palpitations, presyncope or syncope.  Allergies  Allergen Reactions  . Penicillins     Current Outpatient Prescriptions  Medication Sig Dispense Refill  . alendronate (FOSAMAX) 70 MG tablet Take 70 mg by mouth every 7 (seven) days. Take with a full glass of water on an empty stomach.      Marland Kitchen aspirin 325 MG tablet Take 325 mg by mouth daily.      Marland Kitchen METOPROLOL TARTRATE PO Take 25 mg by mouth daily. 1/2 tab two times a day      . Multiple Vitamin (MULTI-VITAMIN DAILY PO) Take by mouth daily.      Ethelda Chick (OYSTER CALCIUM) 500 MG TABS Take 500 mg of elemental calcium by mouth 2 (two) times daily.        Past Medical History  Diagnosis Date  . Mitral prolapse   . Mitral regurgitation   . Gastro-esophageal reflux   . Pericardial effusion   . Osteoporosis   . Arthritis     Past Surgical History  Procedure Date  . Dilation and curettage of uterus   . Right eye      surgery  . Hysterotomy   . Right breast biopsy   . Tonsillectomy     ROS:  As stated in the HPI and negative for all other systems.  PHYSICAL EXAM BP 110/60  Pulse 75  Ht 5' (1.524 m)  Wt 104 lb (47.174 kg)  BMI 20.31 kg/m2 GENERAL:  Frail appearing HEENT:  Pupils equal round and reactive, fundi not visualized, oral mucosa unremarkable NECK:  No jugular venous distention, waveform within normal limits, carotid upstroke brisk and symmetric, no bruits, no thyromegaly LYMPHATICS:  No cervical, inguinal adenopathy LUNGS:  Clear  to auscultation bilaterally BACK:  No CVA tenderness, significant lordosis CHEST:  Unremarkable HEART:  PMI not displaced or sustained,S1 and S2 within normal limits, no S3, no S4, mid systolic click, no rubs, murmur holosystolic radiating anteriorly and slightly to the axilla, no diastolic murmurs ABD:  Flat, positive bowel sounds normal in frequency in pitch, no bruits, no rebound, no guarding, no midline pulsatile mass, no hepatomegaly, no splenomegaly EXT:  2 plus pulses throughout, no edema, no cyanosis no clubbing, muscle wasting somewhat advanced for age. SKIN:  No rashes no nodules NEURO:  Cranial nerves II through XII grossly intact, motor grossly intact throughout PSYCH:  Cognitively intact, oriented to person place and time  EKG:  On this rhythm rate 75, right axis deviation, questionable in lead reversal, sinus arrhythmia, no acute ST-T wave changes. 01/22/2012   ASSESSMENT AND PLAN

## 2012-01-22 NOTE — Assessment & Plan Note (Signed)
He clearly has a mitral valve click and murmur. I will review with a followup echocardiogram. Further plans will be based on this result.

## 2012-01-22 NOTE — Patient Instructions (Signed)
Your physician wants you to follow-up in: ONE YEAR WITH DR Antoine Poche You will receive a reminder letter in the mail two months in advance. If you don't receive a letter, please call our office to schedule the follow-up appointment.   Your physician has requested that you have an echocardiogram. Echocardiography is a painless test that uses sound waves to create images of your heart. It provides your doctor with information about the size and shape of your heart and how well your heart's chambers and valves are working. This procedure takes approximately one hour. There are no restrictions for this procedure.

## 2012-01-22 NOTE — Assessment & Plan Note (Signed)
She is not currently complaining of this. No change in therapy is planned pending the echo results.

## 2012-02-05 ENCOUNTER — Other Ambulatory Visit (HOSPITAL_COMMUNITY): Payer: Medicare Other

## 2012-02-07 ENCOUNTER — Other Ambulatory Visit: Payer: Self-pay

## 2012-02-07 ENCOUNTER — Ambulatory Visit (HOSPITAL_COMMUNITY): Payer: Medicare Other | Attending: Cardiology

## 2012-02-07 DIAGNOSIS — R0602 Shortness of breath: Secondary | ICD-10-CM

## 2012-02-07 DIAGNOSIS — R011 Cardiac murmur, unspecified: Secondary | ICD-10-CM | POA: Insufficient documentation

## 2012-02-07 DIAGNOSIS — I059 Rheumatic mitral valve disease, unspecified: Secondary | ICD-10-CM | POA: Insufficient documentation

## 2012-02-07 DIAGNOSIS — R0609 Other forms of dyspnea: Secondary | ICD-10-CM | POA: Insufficient documentation

## 2012-02-07 DIAGNOSIS — R0989 Other specified symptoms and signs involving the circulatory and respiratory systems: Secondary | ICD-10-CM | POA: Insufficient documentation

## 2012-09-30 DIAGNOSIS — M810 Age-related osteoporosis without current pathological fracture: Secondary | ICD-10-CM | POA: Insufficient documentation

## 2012-09-30 DIAGNOSIS — H47019 Ischemic optic neuropathy, unspecified eye: Secondary | ICD-10-CM | POA: Insufficient documentation

## 2013-02-17 ENCOUNTER — Ambulatory Visit (INDEPENDENT_AMBULATORY_CARE_PROVIDER_SITE_OTHER): Payer: Medicare Other | Admitting: Cardiology

## 2013-02-17 ENCOUNTER — Encounter: Payer: Self-pay | Admitting: Cardiology

## 2013-02-17 VITALS — BP 122/68 | HR 69 | Ht 60.0 in | Wt 103.0 lb

## 2013-02-17 DIAGNOSIS — I059 Rheumatic mitral valve disease, unspecified: Secondary | ICD-10-CM

## 2013-02-17 DIAGNOSIS — I080 Rheumatic disorders of both mitral and aortic valves: Secondary | ICD-10-CM

## 2013-02-17 DIAGNOSIS — I319 Disease of pericardium, unspecified: Secondary | ICD-10-CM

## 2013-02-17 DIAGNOSIS — R0602 Shortness of breath: Secondary | ICD-10-CM

## 2013-02-17 MED ORDER — METOPROLOL TARTRATE 25 MG PO TABS: 12.5000 mg | ORAL_TABLET | Freq: Two times a day (BID) | ORAL | Status: DC

## 2013-02-17 NOTE — Progress Notes (Signed)
   HPI The patient returns for one-year followup. She has mitral valve prolapse and regurgitation. Her echo last year demonstrated moderate prolapse with moderate central regurgitation. Since that time she's had no new symptoms.  She does her household chores. The patient denies any new symptoms such as chest discomfort, neck or arm discomfort. There has been no new shortness of breath, PND or orthopnea. There have been no reported palpitations, presyncope or syncope.  Allergies  Allergen Reactions  . Penicillins     Current Outpatient Prescriptions  Medication Sig Dispense Refill  . alendronate (FOSAMAX) 70 MG tablet Take 70 mg by mouth every 7 (seven) days. Take with a full glass of water on an empty stomach.      Marland Kitchen aspirin 325 MG tablet Take 325 mg by mouth daily.      Marland Kitchen METOPROLOL TARTRATE PO Take 25 mg by mouth daily. 1/2 tab two times a day      . Multiple Vitamin (MULTI-VITAMIN DAILY PO) Take by mouth daily.      Ethelda Chick (OYSTER CALCIUM) 500 MG TABS Take 500 mg of elemental calcium by mouth 2 (two) times daily.       No current facility-administered medications for this visit.    Past Medical History  Diagnosis Date  . Mitral prolapse   . Mitral regurgitation   . Gastro-esophageal reflux   . Pericardial effusion   . Osteoporosis   . Arthritis     Past Surgical History  Procedure Laterality Date  . Dilation and curettage of uterus    . Right eye       surgery  . Hysterotomy    . Right breast biopsy    . Tonsillectomy      ROS:  As stated in the HPI and negative for all other systems.  PHYSICAL EXAM BP 122/68  Pulse 69  Ht 5' (1.524 m)  Wt 103 lb (46.72 kg)  BMI 20.12 kg/m2 GENERAL:  Frail appearing NECK:  No jugular venous distention, waveform within normal limits, carotid upstroke brisk and symmetric, no bruits, no thyromegaly LUNGS:  Clear to auscultation bilaterally BACK:  No CVA tenderness, significant lordosis CHEST:  Unremarkable HEART:  PMI not  displaced or sustained,S1 and S2 within normal limits, no S3, no S4, mid systolic click, no rubs, murmur holosystolic radiating anteriorly and slightly to the axilla, no diastolic murmurs ABD:  Flat, positive bowel sounds normal in frequency in pitch, no bruits, no rebound, no guarding, no midline pulsatile mass, no hepatomegaly, no splenomegaly EXT:  2 plus pulses throughout, no edema, no cyanosis no clubbing, muscle wasting somewhat advanced for age.   EKG:  Normal sinus rhythm rate 65, no acute ST-T wave changes.  02/17/2013   ASSESSMENT AND PLAN  Mitral Regurgitation:  I will check an echocardiogram. Further evaluation and management will be based on these results.  Palpitations:  These are not particularly symptomatic. No change in therapy is indicated.

## 2013-02-17 NOTE — Patient Instructions (Addendum)

## 2013-03-03 ENCOUNTER — Other Ambulatory Visit (HOSPITAL_COMMUNITY): Payer: Medicare Other

## 2013-03-17 ENCOUNTER — Ambulatory Visit (HOSPITAL_COMMUNITY): Payer: Medicare Other | Attending: Cardiology

## 2013-03-17 ENCOUNTER — Other Ambulatory Visit: Payer: Self-pay

## 2013-03-17 DIAGNOSIS — I059 Rheumatic mitral valve disease, unspecified: Secondary | ICD-10-CM

## 2013-03-17 DIAGNOSIS — R0989 Other specified symptoms and signs involving the circulatory and respiratory systems: Secondary | ICD-10-CM | POA: Insufficient documentation

## 2013-03-17 DIAGNOSIS — R0609 Other forms of dyspnea: Secondary | ICD-10-CM | POA: Insufficient documentation

## 2013-03-17 DIAGNOSIS — I080 Rheumatic disorders of both mitral and aortic valves: Secondary | ICD-10-CM

## 2013-03-17 NOTE — Progress Notes (Signed)
Echocardiogram performed.  

## 2013-03-20 ENCOUNTER — Telehealth: Payer: Self-pay | Admitting: Cardiology

## 2013-03-20 NOTE — Telephone Encounter (Signed)
Pt aware of results 

## 2013-03-20 NOTE — Telephone Encounter (Signed)
New problem ° ° °Pt calling for test results °

## 2013-03-31 DIAGNOSIS — Z862 Personal history of diseases of the blood and blood-forming organs and certain disorders involving the immune mechanism: Secondary | ICD-10-CM | POA: Insufficient documentation

## 2013-09-12 ENCOUNTER — Emergency Department: Payer: Self-pay | Admitting: Emergency Medicine

## 2013-09-12 LAB — CBC WITH DIFFERENTIAL/PLATELET
Eosinophil #: 0.1 10*3/uL (ref 0.0–0.7)
HGB: 13.5 g/dL (ref 12.0–16.0)
Lymphocyte #: 1.3 10*3/uL (ref 1.0–3.6)
MCH: 33.3 pg (ref 26.0–34.0)
MCHC: 35.1 g/dL (ref 32.0–36.0)
Neutrophil #: 3.9 10*3/uL (ref 1.4–6.5)
RBC: 4.06 10*6/uL (ref 3.80–5.20)
WBC: 6.3 10*3/uL (ref 3.6–11.0)

## 2013-09-12 LAB — BASIC METABOLIC PANEL
BUN: 15 mg/dL (ref 7–18)
Chloride: 104 mmol/L (ref 98–107)
Co2: 27 mmol/L (ref 21–32)
EGFR (African American): >60
EGFR (Non-African Amer.): >60
Potassium: 4 mmol/L (ref 3.5–5.1)
Sodium: 138 mmol/L (ref 136–145)

## 2013-09-13 ENCOUNTER — Emergency Department: Payer: Self-pay | Admitting: Internal Medicine

## 2013-09-13 ENCOUNTER — Ambulatory Visit: Payer: Self-pay | Admitting: Orthopedic Surgery

## 2013-12-07 ENCOUNTER — Ambulatory Visit: Payer: Self-pay | Admitting: Family Medicine

## 2013-12-28 ENCOUNTER — Ambulatory Visit: Payer: Self-pay | Admitting: Orthopedic Surgery

## 2014-04-07 ENCOUNTER — Ambulatory Visit: Payer: Self-pay | Admitting: Ophthalmology

## 2014-07-13 DIAGNOSIS — M5136 Other intervertebral disc degeneration, lumbar region: Secondary | ICD-10-CM | POA: Insufficient documentation

## 2014-07-13 DIAGNOSIS — M48062 Spinal stenosis, lumbar region with neurogenic claudication: Secondary | ICD-10-CM | POA: Insufficient documentation

## 2014-07-13 DIAGNOSIS — M5416 Radiculopathy, lumbar region: Secondary | ICD-10-CM | POA: Insufficient documentation

## 2014-08-27 ENCOUNTER — Encounter: Payer: Self-pay | Admitting: *Deleted

## 2014-08-31 ENCOUNTER — Encounter: Payer: Self-pay | Admitting: Cardiology

## 2014-08-31 ENCOUNTER — Ambulatory Visit (INDEPENDENT_AMBULATORY_CARE_PROVIDER_SITE_OTHER): Payer: Medicare Other | Admitting: Cardiology

## 2014-08-31 VITALS — BP 104/60 | HR 64 | Ht <= 58 in | Wt 94.1 lb

## 2014-08-31 DIAGNOSIS — I059 Rheumatic mitral valve disease, unspecified: Secondary | ICD-10-CM

## 2014-08-31 DIAGNOSIS — I319 Disease of pericardium, unspecified: Secondary | ICD-10-CM

## 2014-08-31 DIAGNOSIS — I080 Rheumatic disorders of both mitral and aortic valves: Secondary | ICD-10-CM

## 2014-08-31 NOTE — Progress Notes (Signed)
   HPI The patient returns for one-year followup. She has mitral valve prolapse and regurgitation. Her echo last year demonstrated moderate prolapse with moderate central regurgitation. Since that time she's had no new symptoms.  She does her household chores but she has slowed down significantly because of back pain.. The patient denies any new symptoms such as chest discomfort, neck or arm discomfort. There has been no new shortness of breath, PND or orthopnea. There have been no reported palpitations, presyncope or syncope.  Allergies  Allergen Reactions  . Penicillins     Current Outpatient Prescriptions  Medication Sig Dispense Refill  . alendronate (FOSAMAX) 70 MG tablet Take 70 mg by mouth every 7 (seven) days. Take with a full glass of water on an empty stomach.      . Apple Cider Vinegar (APPLE CIDER VINEGAR ULTRA) 600 MG CAPS 2 teaspoons once a day      . aspirin 325 MG tablet Take 325 mg by mouth daily.      . metoprolol tartrate (LOPRESSOR) 25 MG tablet Take 0.5 tablets (12.5 mg total) by mouth 2 (two) times daily.  90 tablet  3  . Multiple Vitamin (MULTI-VITAMIN DAILY PO) Take by mouth daily.      [name] (OYSTER CALCIUM) 500 MG TABS Take 500 mg of elemental calcium by mouth 2 (two) times daily.      . traMADol (ULTRAM) 50 MG tablet Take 1 tablet by mouth 2 (two) times daily.       No current facility-administered medications for this visit.    Past Medical History  Diagnosis Date  . Mitral prolapse   . Mitral regurgitation   . Gastro-esophageal reflux   . Pericardial effusion   . Osteoporosis   . Arthritis     Past Surgical History  Procedure Laterality Date  . Dilation and curettage of uterus    . Right eye       surgery  . Hysterotomy    . Right breast biopsy    . Tonsillectomy      ROS:  As stated in the HPI and negative for all other systems.  PHYSICAL EXAM BP 104/60  Pulse 64  Ht 4\' 10"  (1.473 m)  Wt 94 lb 1.6 oz (42.683 kg)  BMI 19.67  kg/m2 GENERAL:  Frail appearing NECK:  No jugular venous distention, waveform within normal limits, carotid upstroke brisk and symmetric, no bruits, no thyromegaly LUNGS:  Clear to auscultation bilaterally BACK:  No CVA tenderness, significant lordosis CHEST:  Unremarkable HEART:  PMI not displaced or sustained,S1 and S2 within normal limits, no S3, no S4, mid systolic click, no rubs, murmur holosystolic radiating anteriorly and slightly to the axilla, no diastolic murmurs ABD:  Flat, positive bowel sounds normal in frequency in pitch, no bruits, no rebound, no guarding, no midline pulsatile mass, no hepatomegaly, no splenomegaly EXT:  2 plus pulses throughout, no edema, no cyanosis no clubbing, muscle wasting somewhat advanced for age.   EKG:  Normal sinus rhythm rate 64, no acute ST-T wave changes.  08/31/2014   ASSESSMENT AND PLAN  Mitral Regurgitation:  I will not check an echocardiogram this year. She has no new symptoms. She and I have had discussions about the fact that any kind of surgical revascularization would be high risk in the future. We will mostly follow this symptomatically I might consider further echocardiography at the next visit.  Palpitations:  These are not particularly symptomatic. No change in therapy is indicated.

## 2014-08-31 NOTE — Patient Instructions (Signed)
Your physician recommends that you schedule a follow-up appointment in: 1 Year  

## 2015-03-11 DIAGNOSIS — M47817 Spondylosis without myelopathy or radiculopathy, lumbosacral region: Secondary | ICD-10-CM | POA: Insufficient documentation

## 2015-03-21 ENCOUNTER — Ambulatory Visit
Admit: 2015-03-21 | Disposition: A | Discharge status: Home / Self Care | Payer: Self-pay | Attending: Gastroenterology | Admitting: Gastroenterology

## 2015-04-01 NOTE — Op Note (Signed)
PATIENT NAME:  Jennifer Skinner, Jennifer Skinner MR#:  [id number] DATE OF BIRTH:  10-22-1931  DATE OF PROCEDURE:  09/13/2013  PREOPERATIVE DIAGNOSIS: Left thumb paronychia with cellulitis.   POSTOPERATIVE DIAGNOSIS: Left thumb paronychia with cellulitis.   PROCEDURE PERFORMED: Left thumb incision and drainage with partial radial nail plate resection.   SURGEON: Maebelle Munroe, M.D.   ASSISTANT: None.   COMPLICATIONS: None apparent.   ESTIMATED BLOOD LOSS: Less than 10 mL.   MATERIALS AND LABORATORY: Anaerobic, aerobic cultures with Gram stain.   OPERATIVE FINDINGS: Moderate purulence, radial aspect of the left thumb, nail plate consistent with paronychia.   INDICATIONS: Ms. [name] is an 81 year old female who has failed medical management for paronychia, indicating need for operative treatment. Risks, benefits and alternatives were discussed with her to include, wound breakdown and recurrence of infection. She appeared to understand these risks, along with her husband, and desired to proceed with operative treatment.   ANESTHESIA: Local metacarpal block.   DESCRIPTION OF PROCEDURE: After positive identification of the patient, after the correct procedural site had been confirmed, and after informed consent had been obtained, the 5 mL 1:1 mix of 1% plain lidocaine and 0.25% plain Marcaine was injected at the radial, dorsal and ulnar aspects of the thumb MCP, with excellent resultant anesthesia. The left thumb was prepped and draped in the usual sterile fashion. The nail plate was elevated over the radial half of the nail plate, with immediate release of gross purulence from the radial aspect of the nail. The nail plate was taken out of the eponychium, and was resected over the radial half of the nail. The wound was then copiously irrigated with normal saline. Bacitracin, Xeroform and 4 x 4's, with Kling and finger splint with Coban were applied. Cultures were obtained of the purulence.   Coban was  placed. These findings were relayed to her husband, who appeared to understand the nature of the operation, as well as her need for follow-up and compliance.     ____________________________ Maebelle Munroe, MD jfs:cg D: 09/13/2013 18:54:00 ET T: 09/14/2013 04:02:04 ET JOB#: [id number]  cc: Maebelle Munroe, MD, <Dictator> Maebelle Munroe MD ELECTRONICALLY SIGNED 10/23/2013 21:18

## 2015-04-01 NOTE — Consult Note (Signed)
PATIENT NAME:  Jennifer Skinner, MCGOUGH MR#:  [id number] DATE OF BIRTH:  Jan 03, 1931  DATE OF CONSULTATION:  09/13/2013  CONSULTANT:  Maebelle Munroe, MD  CHIEF COMPLAINT: Left thumb pain.   HISTORY OF PRESENT ILLNESS: Jennifer Skinner is an 81 year old right-hand dominant female who presents with her husband today on a return visit to the Emergency Department after presenting yesterday with what was described initially as a thumb pulp infection. She was placed on clindamycin, and returned today for a wound check, and had not improved on clindamycin and elevation.   She complains of sharp, throbbing pain in the left nondominant thumb, especially over the radial aspect of the nail plate.   She denies any fevers or chills or shakes.   PAST MEDICAL HISTORY:  Remarkable for palpitations and osteoporosis.   PAST SURGICAL HISTORY: Remarkable for hysterectomy.  CURRENT MEDICATIONS INCLUDE:  Clindamycin 300 mg p.o. t.i.d., Toprol 12.5 mg p.o. b.i.d., aspirin 325 mg p.o. per day, and Fosamax.   ALLERGIES:  PENICILLIN.    SOCIAL HISTORY:  No tobacco use.   FAMILY HISTORY:  Positive for coronary artery disease.   PHYSICAL EXAMINATION: GENERAL: A pleasant, alert, elderly female, cooperative with examination.  PSYCHIATRIC: Mood and affect appropriate.  LUNGS: Clear to auscultation bilaterally.  HEENT: Normocephalic, atraumatic. Sclerae clear. Oral mucosa membranes are moist.  HEART: Regular rate and rhythm. Normal S1 and S2. No murmurs or gallops.  LYMPHATIC EXAMINATION: Marked swelling over the left thumb.   SKIN EXAMINATION: Shows the skin is intact, though it is moderately swollen and erythematous, especially over the radial aspect of the left thumb.  NEUROLOGIC EXAMINATION: Motor and light touch sensation examination intact at the left thumb.  MUSCULOSKELETAL EXAMINATION: Reveals marked tenderness, especially over the radial border of the nail plate, with a clinical paronychia present, with approximately a 6  x 6 mm area of purulence directly under the skin.   DATA:  Radiographs are unremarkable, except for soft tissue swelling. Laboratory evaluation yesterday revealed normal CBC and chemistries. Culture and Gram stain of purulent left thumb fluid shows many gram-positive cocci in pairs, with a few gram-negative rods.   I will dictate a separate operative report.   PROCEDURE: I and D of left thumb, with partial nail resection.   IMPRESSION: Left thumb paronychia with cellulitis.   PLAN: I and D of left thumb, with partial nail resection of the radial aspect of the nail performed, along with irrigation and debridement. The patient will continue on clindamycin. She will have a wound check in approximately 4 days. She will continue on clindamycin. I will have Dr. Marry Guan or his PA check the laboratory results for final culture results on Tuesday. She is to keep her splint clean and dry, as well as her dressing, and keep her finger elevated above her heart. She was given pain medication by the ER Department last night. I did give her another prescription for clindamycin 300 mg p.o. t.i.d. She was instructed to follow up with Dr. Marry Guan in 4 to 5 days, or sooner if questions, concerns or problems. She was apprised of the risks and benefits prior to the procedure, mainly recurrence of infection or skin breakdown.    ____________________________ Maebelle Munroe, MD jfs:mr D: 09/13/2013 18:50:00 ET T: 09/13/2013 19:12:56 ET JOB#: [id number]  cc: Maebelle Munroe, MD, <Dictator> Maebelle Munroe MD ELECTRONICALLY SIGNED 09/13/2013 20:44

## 2015-09-06 ENCOUNTER — Encounter: Payer: Self-pay | Admitting: Cardiology

## 2015-09-26 ENCOUNTER — Ambulatory Visit: Payer: BC Managed Care – PPO | Admitting: Cardiology

## 2015-09-27 ENCOUNTER — Ambulatory Visit (INDEPENDENT_AMBULATORY_CARE_PROVIDER_SITE_OTHER): Payer: PPO | Admitting: Family Medicine

## 2015-09-27 ENCOUNTER — Encounter: Payer: Self-pay | Admitting: Family Medicine

## 2015-09-27 VITALS — BP 141/69 | HR 67 | Temp 97.8°F | Resp 16 | Ht 59.0 in | Wt 92.4 lb

## 2015-09-27 DIAGNOSIS — Z23 Encounter for immunization: Secondary | ICD-10-CM

## 2015-09-27 DIAGNOSIS — M549 Dorsalgia, unspecified: Secondary | ICD-10-CM

## 2015-09-27 DIAGNOSIS — G8929 Other chronic pain: Secondary | ICD-10-CM

## 2015-09-27 DIAGNOSIS — E039 Hypothyroidism, unspecified: Secondary | ICD-10-CM | POA: Diagnosis not present

## 2015-09-27 MED ORDER — LEVOTHYROXINE SODIUM 25 MCG PO TABS: 25.0000 ug | ORAL_TABLET | Freq: Every day | ORAL | Status: DC

## 2015-09-27 MED ORDER — GABAPENTIN 100 MG PO CAPS: 100.0000 mg | ORAL_CAPSULE | Freq: Three times a day (TID) | ORAL | Status: DC

## 2015-09-27 NOTE — Progress Notes (Signed)
Name: Jennifer Skinner   MRN: [id number]    DOB: 1931-06-23   Date:09/27/2015       Progress Note  Subjective  Chief Complaint  Chief Complaint  Patient presents with  . Arthritis  . Numbness    fingers occasionally mostly thumb    HPI  Here for f/u of BP and hypothyroiodism.   She stopped taking her Levothyroxine.  She just finished  A bottle and never got it filled again.  Doesn't remember how long she has been off it.  Never had TSH rechecked.   Takes Tramadol tid daily for back pain.   C/o fingers  feel numb and tingly at times..  No arthritis pain in fingers. No problem-specific assessment & plan notes found for this encounter.   Past Medical History  Diagnosis Date  . Mitral prolapse   . Mitral regurgitation   . Gastro-esophageal reflux   . Pericardial effusion   . Osteoporosis   . Arthritis   . Heart disease     Social History  Substance Use Topics  . Smoking status: Never Smoker   . Smokeless tobacco: Never Used  . Alcohol Use: No     Current outpatient prescriptions:  .  Apple Cider Vinegar (APPLE CIDER VINEGAR ULTRA) 600 MG CAPS, 2 teaspoons once a day, Disp: , Rfl:  .  aspirin 325 MG tablet, Take 325 mg by mouth daily., Disp: , Rfl:  .  levothyroxine (SYNTHROID, LEVOTHROID) 25 MCG tablet, Take 25 mcg by mouth daily before breakfast. Ran out of meds., Disp: , Rfl:  .  metoprolol tartrate (LOPRESSOR) 25 MG tablet, Take 0.5 tablets (12.5 mg total) by mouth 2 (two) times daily., Disp: 90 tablet, Rfl: 3 .  Multiple Vitamin (MULTI-VITAMIN DAILY PO), Take by mouth daily., Disp: , Rfl:  .  Oyster Shell (OYSTER CALCIUM) 500 MG TABS, Take 500 mg of elemental calcium by mouth 2 (two) times daily., Disp: , Rfl:  .  traMADol (ULTRAM) 50 MG tablet, Take 1 tablet by mouth 2 (two) times daily., Disp: , Rfl:   Allergies  Allergen Reactions  . Penicillins     Review of Systems  Constitutional: Positive for malaise/fatigue. Negative for fever, chills and weight loss.   HENT: Negative for hearing loss.   Eyes: Negative for blurred vision and double vision.  Respiratory: Negative for cough, sputum production, shortness of breath and wheezing.   Cardiovascular: Negative for chest pain, palpitations and leg swelling.  Gastrointestinal: Negative for heartburn, abdominal pain and blood in stool.  Genitourinary: Negative for dysuria, urgency and frequency.  Musculoskeletal: Positive for back pain. Negative for myalgias.  Neurological: Positive for tingling (fingers on and off.). Negative for weakness and headaches.      Objective  Filed Vitals:   09/27/15 1033  BP: 141/69  Pulse: 67  Temp: 97.8 F (36.6 C)  TempSrc: Oral  Resp: 16  Height: 4\' 11"  (1.499 m)  Weight: 92 lb 6.4 oz (41.912 kg)     Physical Exam  Constitutional: She is oriented to person, place, and time and well-developed, well-nourished, and in no distress. No distress.  HENT:  Head: Normocephalic and atraumatic.  Neck: Normal range of motion. Neck supple. Carotid bruit is not present. No thyromegaly present.  Cardiovascular: Normal rate and regular rhythm.  Exam reveals no gallop and no friction rub.   Murmur heard.  Systolic murmur is present with a grade of 3/6  apex  Pulmonary/Chest: Effort normal and breath sounds normal. No respiratory distress. She  has no wheezes. She has no rales.  Abdominal: Soft. She exhibits no mass.  Musculoskeletal: She exhibits no edema.  Thoracic kyphosis.  Pain throughout entire spinal column.  Lymphadenopathy:    She has no cervical adenopathy.  Neurological: She is alert and oriented to person, place, and time.  Vitals reviewed.     No results found for this or any previous visit (from the past 2160 hour(s)).   Assessment & Plan 1. Hypothyroidism, unspecified hypothyroidism type -restart Levothyroxine 25 mcg/day.  2. Chronic back pain  - gabapentin (NEURONTIN) 100 MG capsule; Take 1 capsule (100 mg total) by mouth 3 (three) times  daily.  Dispense: 90 capsule; Refill: 3 -decrease Tramadol to 1 twice a day.  3. Need for influenza vaccination  - Flu vaccine HIGH DOSE PF (Fluzone High dose)

## 2015-09-27 NOTE — Patient Instructions (Addendum)
Start Gabapentin 100 mg at night, then increase by 1 per day every 2 weeks until on 1 three times  a day.  Decrease Tramadol to 1 twice a day for now.  Restart Levothyroxine 25 mcg/day  Get TSH on return

## 2015-10-07 ENCOUNTER — Encounter: Payer: Self-pay | Admitting: Emergency Medicine

## 2015-10-07 ENCOUNTER — Ambulatory Visit
Admission: EM | Admit: 2015-10-07 | Discharge: 2015-10-07 | Disposition: A | Discharge status: Home / Self Care | Payer: PPO | Attending: Family Medicine | Admitting: Family Medicine

## 2015-10-07 ENCOUNTER — Ambulatory Visit: Payer: PPO

## 2015-10-07 DIAGNOSIS — J189 Pneumonia, unspecified organism: Secondary | ICD-10-CM

## 2015-10-07 DIAGNOSIS — J181 Lobar pneumonia, unspecified organism: Principal | ICD-10-CM

## 2015-10-07 DIAGNOSIS — E871 Hypo-osmolality and hyponatremia: Secondary | ICD-10-CM

## 2015-10-07 DIAGNOSIS — R5383 Other fatigue: Secondary | ICD-10-CM

## 2015-10-07 LAB — URINALYSIS COMPLETE WITH MICROSCOPIC (ARMC ONLY)
Bilirubin Urine: NEGATIVE
GLUCOSE, UA: NEGATIVE mg/dL
HGB URINE DIPSTICK: NEGATIVE
Ketones, ur: NEGATIVE mg/dL
LEUKOCYTES UA: NEGATIVE
Nitrite: NEGATIVE
PH: 6 (ref 5.0–8.0)
PROTEIN: NEGATIVE mg/dL
RBC / HPF: NONE SEEN RBC/hpf (ref <3)
Specific Gravity, Urine: 1.01 (ref 1.005–1.030)

## 2015-10-07 LAB — CBC WITH DIFFERENTIAL/PLATELET
BASOS ABS: 0 10*3/uL (ref 0–0.1)
BASOS PCT: 0 %
EOS ABS: 0 10*3/uL (ref 0–0.7)
Eosinophils Relative: 0 %
HCT: 39.2 % (ref 35.0–47.0)
HEMOGLOBIN: 13.4 g/dL (ref 12.0–16.0)
Lymphocytes Relative: 10 %
Lymphs Abs: 0.7 10*3/uL — ABNORMAL LOW (ref 1.0–3.6)
MCH: 32.8 pg (ref 26.0–34.0)
MCHC: 34.1 g/dL (ref 32.0–36.0)
MCV: 96.1 fL (ref 80.0–100.0)
Monocytes Absolute: 0.7 10*3/uL (ref 0.2–0.9)
Monocytes Relative: 10 %
NEUTROS PCT: 80 %
Neutro Abs: 5 10*3/uL (ref 1.4–6.5)
Platelets: 259 10*3/uL (ref 150–440)
RBC: 4.08 MIL/uL (ref 3.80–5.20)
RDW: 13.2 % (ref 11.5–14.5)
WBC: 6.3 10*3/uL (ref 3.6–11.0)

## 2015-10-07 LAB — COMPREHENSIVE METABOLIC PANEL
ALBUMIN: 3.4 g/dL — AB (ref 3.5–5.0)
ALK PHOS: 76 U/L (ref 38–126)
ALT: 32 U/L (ref 14–54)
AST: 38 U/L (ref 15–41)
Anion gap: 10 (ref 5–15)
BUN: 16 mg/dL (ref 6–20)
CALCIUM: 8.9 mg/dL (ref 8.9–10.3)
CO2: 29 mmol/L (ref 22–32)
CREATININE: 0.45 mg/dL (ref 0.44–1.00)
Chloride: 91 mmol/L — ABNORMAL LOW (ref 101–111)
GFR calc Af Amer: >60 mL/min (ref >60)
GFR calc non Af Amer: >60 mL/min (ref >60)
GLUCOSE: 88 mg/dL (ref 65–99)
Potassium: 4.2 mmol/L (ref 3.5–5.1)
SODIUM: 130 mmol/L — AB (ref 135–145)
Total Bilirubin: 0.4 mg/dL (ref 0.3–1.2)
Total Protein: 6.8 g/dL (ref 6.5–8.1)

## 2015-10-07 MED ORDER — LEVOFLOXACIN 500 MG PO TABS: 500.0000 mg | ORAL_TABLET | Freq: Every day | ORAL | Status: DC

## 2015-10-07 NOTE — Discharge Instructions (Signed)

## 2015-10-07 NOTE — ED Notes (Signed)
Weakness for weeks

## 2015-10-07 NOTE — ED Provider Notes (Signed)
CSN: [id number]     Arrival date & time 10/07/15  0957 History   First MD Initiated Contact with Patient 10/07/15 1022     Chief Complaint  Patient presents with  . Weakness   (Consider location/radiation/quality/duration/timing/severity/associated sxs/prior Treatment) HPI Comments: 81 yo female presents with a complaint of weakness/fatigue "on and off" for weeks or months, but this latest episode started 2-3 days ago. States started feeling weak, low energy, decreased appetite. Denies any fevers, chills, cough, chest pain, palpitations, shortness of breath, vomiting, diarrhea, melena or hematochezia. Denies any new medications.   The history is provided by the patient.    Past Medical History  Diagnosis Date  . Mitral prolapse   . Mitral regurgitation   . Gastro-esophageal reflux   . Pericardial effusion   . Osteoporosis   . Arthritis   . Heart disease    Past Surgical History  Procedure Laterality Date  . Dilation and curettage of uterus    . Right eye       surgery  . Hysterotomy    . Right breast biopsy    . Tonsillectomy     History reviewed. No pertinent family history. Social History  Substance Use Topics  . Smoking status: Never Smoker   . Smokeless tobacco: Never Used  . Alcohol Use: No   OB History    No data available     Review of Systems  Allergies  Penicillins  Home Medications   Prior to Admission medications   Medication Sig Start Date End Date Taking? Authorizing Provider  Gaffer (APPLE CIDER VINEGAR ULTRA) 600 MG CAPS 2 teaspoons once a day    Historical Provider, MD  aspirin 325 MG tablet Take 325 mg by mouth daily.    Historical Provider, MD  gabapentin (NEURONTIN) 100 MG capsule Take 1 capsule (100 mg total) by mouth 3 (three) times daily. 09/27/15   Arlis Porta., MD  levofloxacin (LEVAQUIN) 500 MG tablet Take 1 tablet (500 mg total) by mouth daily. 10/07/15   Norval Gable, MD  levothyroxine (SYNTHROID, LEVOTHROID) 25  MCG tablet Take 1 tablet (25 mcg total) by mouth daily before breakfast. Ran out of meds. 09/27/15   Arlis Porta., MD  metoprolol tartrate (LOPRESSOR) 25 MG tablet Take 0.5 tablets (12.5 mg total) by mouth 2 (two) times daily. 02/17/13   Minus Breeding, MD  Multiple Vitamin (MULTI-VITAMIN DAILY PO) Take by mouth daily.    Historical Provider, MD  [name] (OYSTER CALCIUM) 500 MG TABS Take 500 mg of elemental calcium by mouth 2 (two) times daily.    Historical Provider, MD  traMADol (ULTRAM) 50 MG tablet Take 1 tablet by mouth 2 (two) times daily. 08/23/14   Historical Provider, MD   Meds Ordered and Administered this Visit  Medications - No data to display  BP 107/66 mmHg  Pulse 103  Temp(Src) 98.3 F (36.8 C) (Tympanic)  Resp 20  Ht 4' 9.5" (1.461 m)  Wt 99 lb (44.906 kg)  BMI 21.04 kg/m2  SpO2 96% No data found.   Physical Exam  Constitutional: She appears well-developed and well-nourished. No distress.  HENT:  Head: Normocephalic.  Neck: Neck supple.  Cardiovascular: Normal rate, regular rhythm and normal heart sounds.   Pulmonary/Chest: Effort normal. No respiratory distress. She has no wheezes. She has rales (bases bilaterally).  Neurological: She is alert.  Skin: No rash noted. She is not diaphoretic.  Psychiatric: She has a normal mood and affect. Judgment and  thought content normal.  Nursing note and vitals reviewed.   ED Course  Procedures (including critical care time)  Labs Review Labs Reviewed  URINALYSIS COMPLETEWITH MICROSCOPIC ([place] ONLY) - Abnormal; Notable for the following:    Bacteria, UA MANY (*)    Squamous Epithelial / LPF 0-5 (*)    All other components within normal limits  CBC WITH DIFFERENTIAL/PLATELET - Abnormal; Notable for the following:    Lymphs Abs 0.7 (*)    All other components within normal limits  COMPREHENSIVE METABOLIC PANEL - Abnormal; Notable for the following:    Sodium 130 (*)    Chloride 91 (*)    Albumin 3.4 (*)     All other components within normal limits  URINE CULTURE    Imaging Review Dg Chest 2 View  10/07/2015  CLINICAL DATA:  Acute onset shortness of breath 2 days ago. 1 day history of generalized weakness. EXAM: CHEST  2 VIEW COMPARISON:  12/07/2013. FINDINGS: Cardiac silhouette markedly enlarged, unchanged. Thoracic aorta tortuous and atherosclerotic, unchanged. Markedly enlarged central pulmonary arteries, particularly on the right, unchanged. Linear scarring in both lung bases localizing to the lower lobes, right middle lobe and lingula. New patchy airspace opacities in the right lower lobe. Hyperinflation, emphysematous changes throughout both lungs, and mild central peribronchial thickening, unchanged. Small right pleural effusion. No left pleural effusion. No pneumothorax. Severe osseous demineralization. Thoracolumbar scoliosis convex left. IMPRESSION: 1. 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 Small right parapneumonic effusion. 2. Stable linear scarring in the lower lobes, right middle lobe and lingula. 3. Stable marked cardiomegaly without evidence pulmonary edema. 4. Stable markedly enlarged central pulmonary arteries, right larger than left. Electronically Signed   By: Evangeline Dakin M.D.   On: 10/07/2015 12:02     Visual Acuity Review  Right Eye Distance:   Left Eye Distance:   Bilateral Distance:    Right Eye Near:   Left Eye Near:    Bilateral Near:         MDM   1. Right lower lobe pneumonia   2. Hyponatremia   3. Other fatigue   (#2 and #3 likely due to pneumonia)   Discharge Medication List as of 10/07/2015 12:31 PM    START taking these medications   Details  levofloxacin (LEVAQUIN) 500 MG tablet Take 1 tablet (500 mg total) by mouth daily., Starting 10/07/2015, Until Discontinued, Normal      1. Labs/x-ray results and diagnosis reviewed with patient and husband; recheck O2 sat was 96 %. 2. rx as per orders above; reviewed possible  side effects, interactions, risks and benefits  3. Recommend supportive treatment with rest, increased fluids 4. Follow-up with PCP on Monday (3 days) or follow up sooner here or ED if symptoms worsen or not improving   Norval Gable, MD 10/07/15 1423

## 2015-10-09 ENCOUNTER — Telehealth: Payer: Self-pay | Admitting: Gynecology

## 2015-10-09 NOTE — ED Notes (Signed)
Patient c/o was seen on Friday 10/07/2015 and was dx with pneumonia. Per pt. Was give Levaquin 500 mg , however she believes she is having side effect from the medication. Patient stated swelling in her feet. Patient request that she wants a call back from you today.

## 2015-10-10 ENCOUNTER — Emergency Department: Payer: PPO

## 2015-10-10 ENCOUNTER — Ambulatory Visit (INDEPENDENT_AMBULATORY_CARE_PROVIDER_SITE_OTHER): Payer: PPO | Admitting: Family Medicine

## 2015-10-10 ENCOUNTER — Encounter: Payer: Self-pay | Admitting: Family Medicine

## 2015-10-10 ENCOUNTER — Inpatient Hospital Stay
Admission: EM | Admit: 2015-10-10 | Discharge: 2015-10-13 | DRG: 193 | Disposition: A | Discharge status: Home / Self Care | Payer: PPO | Attending: Internal Medicine | Admitting: Internal Medicine

## 2015-10-10 ENCOUNTER — Encounter: Payer: Self-pay | Admitting: Emergency Medicine

## 2015-10-10 VITALS — BP 97/65 | HR 103 | Temp 97.4°F | Resp 16 | Ht <= 58 in | Wt 100.6 lb

## 2015-10-10 DIAGNOSIS — J181 Lobar pneumonia, unspecified organism: Principal | ICD-10-CM

## 2015-10-10 DIAGNOSIS — Z7982 Long term (current) use of aspirin: Secondary | ICD-10-CM

## 2015-10-10 DIAGNOSIS — J690 Pneumonitis due to inhalation of food and vomit: Secondary | ICD-10-CM | POA: Insufficient documentation

## 2015-10-10 DIAGNOSIS — K219 Gastro-esophageal reflux disease without esophagitis: Secondary | ICD-10-CM | POA: Diagnosis present

## 2015-10-10 DIAGNOSIS — R0602 Shortness of breath: Secondary | ICD-10-CM

## 2015-10-10 DIAGNOSIS — J189 Pneumonia, unspecified organism: Principal | ICD-10-CM | POA: Diagnosis present

## 2015-10-10 DIAGNOSIS — I272 Other secondary pulmonary hypertension: Secondary | ICD-10-CM | POA: Diagnosis present

## 2015-10-10 DIAGNOSIS — I340 Nonrheumatic mitral (valve) insufficiency: Secondary | ICD-10-CM | POA: Diagnosis present

## 2015-10-10 DIAGNOSIS — J449 Chronic obstructive pulmonary disease, unspecified: Secondary | ICD-10-CM | POA: Diagnosis present

## 2015-10-10 DIAGNOSIS — I5033 Acute on chronic diastolic (congestive) heart failure: Secondary | ICD-10-CM | POA: Diagnosis present

## 2015-10-10 DIAGNOSIS — I509 Heart failure, unspecified: Secondary | ICD-10-CM

## 2015-10-10 DIAGNOSIS — I341 Nonrheumatic mitral (valve) prolapse: Secondary | ICD-10-CM | POA: Diagnosis present

## 2015-10-10 DIAGNOSIS — Z808 Family history of malignant neoplasm of other organs or systems: Secondary | ICD-10-CM

## 2015-10-10 DIAGNOSIS — M1990 Unspecified osteoarthritis, unspecified site: Secondary | ICD-10-CM | POA: Diagnosis present

## 2015-10-10 DIAGNOSIS — E871 Hypo-osmolality and hyponatremia: Secondary | ICD-10-CM | POA: Diagnosis present

## 2015-10-10 DIAGNOSIS — M810 Age-related osteoporosis without current pathological fracture: Secondary | ICD-10-CM | POA: Diagnosis present

## 2015-10-10 DIAGNOSIS — Z8249 Family history of ischemic heart disease and other diseases of the circulatory system: Secondary | ICD-10-CM

## 2015-10-10 DIAGNOSIS — J9601 Acute respiratory failure with hypoxia: Secondary | ICD-10-CM | POA: Diagnosis present

## 2015-10-10 LAB — URINE CULTURE
Culture: >=100000
SPECIAL REQUESTS: NORMAL

## 2015-10-10 NOTE — ED Notes (Signed)
Patient diagnosed with pneumonia on Friday.  Currently taking Levaquin.  C/O SOB this evening.  Seen by PCP today to be checked out and was told to come to ED if breathing worsened any.

## 2015-10-10 NOTE — Progress Notes (Signed)
Name: Jennifer Skinner   MRN: [id number]    DOB: [birth year]   Date:10/10/2015       Progress Note  Subjective  Chief Complaint  Chief Complaint  Patient presents with  . Hospitalization Follow-up    pneumonia    HPI Here for f/u of pneumn i.  Seen in [facility] on Fri.  Started on Levaquin but she stopped it after 2 doses because of feet swelling and nausea.  She was changed to Doxycycline yesterday.  Has taken 2 doses of Doxycycline.  Feet are still swollen and she still has some nausea, but she is trying to take it on an empty stomach.  Breathing is doing fair.    No problem-specific assessment & plan notes found for this encounter.   Past Medical History  Diagnosis Date  . Mitral prolapse   . Mitral regurgitation   . Gastro-esophageal reflux   . Pericardial effusion   . Osteoporosis   . Arthritis   . Heart disease     Social History  Substance Use Topics  . Smoking status: Never Smoker   . Smokeless tobacco: Never Used  . Alcohol Use: No     Current outpatient prescriptions:  .  Apple Cider Vinegar (APPLE CIDER VINEGAR ULTRA) 600 MG CAPS, 2 teaspoons once a day, Disp: , Rfl:  .  aspirin 325 MG tablet, Take 325 mg by mouth daily., Disp: , Rfl:  .  doxycycline (VIBRAMYCIN) 100 MG capsule, , Disp: , Rfl:  .  gabapentin (NEURONTIN) 100 MG capsule, Take 1 capsule (100 mg total) by mouth 3 (three) times daily., Disp: 90 capsule, Rfl: 3 .  levofloxacin (LEVAQUIN) 500 MG tablet, Take 1 tablet (500 mg total) by mouth daily., Disp: 7 tablet, Rfl: 0 .  levothyroxine (SYNTHROID, LEVOTHROID) 25 MCG tablet, Take 1 tablet (25 mcg total) by mouth daily before breakfast. Ran out of meds., Disp: 30 tablet, Rfl: 6 .  metoprolol tartrate (LOPRESSOR) 25 MG tablet, Take 0.5 tablets (12.5 mg total) by mouth 2 (two) times daily., Disp: 90 tablet, Rfl: 3 .  Multiple Vitamin (MULTI-VITAMIN DAILY PO), Take by mouth daily., Disp: , Rfl:  .  Oyster Shell (OYSTER CALCIUM) 500 MG TABS, Take  500 mg of elemental calcium by mouth 2 (two) times daily., Disp: , Rfl:  .  traMADol (ULTRAM) 50 MG tablet, Take 1 tablet by mouth 2 (two) times daily., Disp: , Rfl:   Allergies  Allergen Reactions  . Penicillins     Review of Systems  Constitutional: Positive for malaise/fatigue. Negative for fever and chills.  HENT: Negative for hearing loss.   Eyes: Negative for blurred vision and double vision.  Respiratory: Positive for shortness of breath. Negative for cough, sputum production and wheezing.   Cardiovascular: Positive for leg swelling. Negative for chest pain and palpitations.  Gastrointestinal: Negative for heartburn, abdominal pain and blood in stool.  Genitourinary: Negative for dysuria, urgency and frequency.  Neurological: Positive for weakness. Negative for dizziness, tremors and headaches.      Objective  Filed Vitals:   10/10/15 1445  BP: 97/65  Pulse: 103  Temp: 97.4 F (36.3 C)  TempSrc: Oral  Resp: 16  Height: 4' 10" (1.473 m)  Weight: 100 lb 9.6 oz (45.632 kg)  SpO2: 89%     Physical Exam  Constitutional: She is oriented to person, place, and time and well-developed, well-nourished, and in no distress. No distress.  HENT:  Head: Normocephalic and atraumatic.  Neck: Normal range of  motion. Neck supple. Carotid bruit is not present. No thyromegaly present.  Cardiovascular: Regular rhythm.  Tachycardia present.  Exam reveals no gallop and no friction rub.   Murmur heard.  Systolic murmur is present with a grade of 2/6  throughout  Pulmonary/Chest: Effort normal. No respiratory distress. She has no wheezes. She has rales (RLL.).  Abdominal: Soft. Bowel sounds are normal. She exhibits no distension and no mass. There is no tenderness.  Musculoskeletal: She exhibits edema (1+ bilateral edema of ankles.).  Lymphadenopathy:    She has no cervical adenopathy.  Neurological: She is alert and oriented to person, place, and time.  Vitals  reviewed.     Recent Results (from the past 2160 hour(s))  Urinalysis complete, with microscopic     Status: Abnormal   Collection Time: 10/07/15 11:06 AM  Result Value Ref Range   Color, Urine YELLOW YELLOW   APPearance CLEAR CLEAR   Glucose, UA NEGATIVE NEGATIVE mg/dL   Bilirubin Urine NEGATIVE NEGATIVE   Ketones, ur NEGATIVE NEGATIVE mg/dL   Specific Gravity, Urine 1.010 1.005 - 1.030   Hgb urine dipstick NEGATIVE NEGATIVE   pH 6.0 5.0 - 8.0   Protein, ur NEGATIVE NEGATIVE mg/dL   Nitrite NEGATIVE NEGATIVE   Leukocytes, UA NEGATIVE NEGATIVE   RBC / HPF NONE SEEN <3 RBC/hpf   WBC, UA 0-5 <3 WBC/hpf   Bacteria, UA MANY (A) RARE   Squamous Epithelial / LPF 0-5 (A) RARE   Hyaline Casts, UA PRESENT   Urine culture     Status: None   Collection Time: 10/07/15 11:06 AM  Result Value Ref Range   Specimen Description URINE, CLEAN CATCH    Special Requests Normal    Culture >=100,000 COLONIES/mL ENTEROCOCCUS FAECALIS    Report Status 10/10/2015 FINAL    Organism ID, Bacteria ENTEROCOCCUS FAECALIS       Susceptibility   Enterococcus faecalis - MIC*    AMPICILLIN <=2 SENSITIVE Sensitive     NITROFURANTOIN <=16 SENSITIVE Sensitive     LINEZOLID 2 SENSITIVE Sensitive     CIPROFLOXACIN Value in next row Sensitive      SENSITIVE1    LEVOFLOXACIN Value in next row Sensitive      SENSITIVE2    TETRACYCLINE Value in next row Resistant      RESISTANT>=16    * >=100,000 COLONIES/mL ENTEROCOCCUS FAECALIS  CBC with Differential     Status: Abnormal   Collection Time: 10/07/15 11:06 AM  Result Value Ref Range   WBC 6.3 3.6 - 11.0 K/uL   RBC 4.08 3.80 - 5.20 MIL/uL   Hemoglobin 13.4 12.0 - 16.0 g/dL   HCT 39.2 35.0 - 47.0 %   MCV 96.1 80.0 - 100.0 fL   MCH 32.8 26.0 - 34.0 pg   MCHC 34.1 32.0 - 36.0 g/dL   RDW 13.2 11.5 - 14.5 %   Platelets 259 150 - 440 K/uL   Neutrophils Relative % 80 %   Neutro Abs 5.0 1.4 - 6.5 K/uL   Lymphocytes Relative 10 %   Lymphs Abs 0.7 (L) 1.0 - 3.6  K/uL   Monocytes Relative 10 %   Monocytes Absolute 0.7 0.2 - 0.9 K/uL   Eosinophils Relative 0 %   Eosinophils Absolute 0.0 0 - 0.7 K/uL   Basophils Relative 0 %   Basophils Absolute 0.0 0 - 0.1 K/uL  Comprehensive metabolic panel     Status: Abnormal   Collection Time: 10/07/15 11:06 AM  Result Value Ref Range  Sodium 130 (L) 135 - 145 mmol/L   Potassium 4.2 3.5 - 5.1 mmol/L   Chloride 91 (L) 101 - 111 mmol/L   CO2 29 22 - 32 mmol/L   Glucose, Bld 88 65 - 99 mg/dL   BUN 16 6 - 20 mg/dL   Creatinine, Ser 0.45 0.44 - 1.00 mg/dL   Calcium 8.9 8.9 - 10.3 mg/dL   Total Protein 6.8 6.5 - 8.1 g/dL   Albumin 3.4 (L) 3.5 - 5.0 g/dL   AST 38 15 - 41 U/L   ALT 32 14 - 54 U/L   Alkaline Phosphatase 76 38 - 126 U/L   Total Bilirubin 0.4 0.3 - 1.2 mg/dL   GFR calc non Af Amer >60 >60 mL/min   GFR calc Af Amer >60 >60 mL/min    Comment: (NOTE) The eGFR has been calculated using the CKD EPI equation. This calculation has not been validated in all clinical situations. eGFR's persistently <60 mL/min signify possible Chronic Kidney Disease.    Anion gap 10 5 - 15     Assessment & Plan  1. Right lower lobe pneumonia -Stop Doxycycline and restart Levaquin 500 mg., 1 daily for another 5 days, starting today.  Only missed 1 dose yesterday. -drink plenty of fluids and get nutrition in

## 2015-10-10 NOTE — ED Notes (Signed)
Patient transported to X-ray 

## 2015-10-10 NOTE — Patient Instructions (Signed)
Go to ER at [facility] if she gets any worse.

## 2015-10-11 ENCOUNTER — Inpatient Hospital Stay: Payer: BC Managed Care – PPO | Admitting: Family Medicine

## 2015-10-11 ENCOUNTER — Inpatient Hospital Stay (HOSPITAL_COMMUNITY)
Admit: 2015-10-11 | Discharge: 2015-10-11 | Disposition: A | Discharge status: Home / Self Care | Payer: PPO | Attending: Internal Medicine | Admitting: Internal Medicine

## 2015-10-11 ENCOUNTER — Encounter: Payer: Self-pay | Admitting: Internal Medicine

## 2015-10-11 DIAGNOSIS — J189 Pneumonia, unspecified organism: Secondary | ICD-10-CM | POA: Diagnosis not present

## 2015-10-11 DIAGNOSIS — I340 Nonrheumatic mitral (valve) insufficiency: Secondary | ICD-10-CM | POA: Diagnosis present

## 2015-10-11 DIAGNOSIS — M810 Age-related osteoporosis without current pathological fracture: Secondary | ICD-10-CM | POA: Diagnosis present

## 2015-10-11 DIAGNOSIS — M1990 Unspecified osteoarthritis, unspecified site: Secondary | ICD-10-CM | POA: Diagnosis present

## 2015-10-11 DIAGNOSIS — K219 Gastro-esophageal reflux disease without esophagitis: Secondary | ICD-10-CM | POA: Diagnosis present

## 2015-10-11 DIAGNOSIS — J90 Pleural effusion, not elsewhere classified: Secondary | ICD-10-CM | POA: Diagnosis not present

## 2015-10-11 DIAGNOSIS — Z808 Family history of malignant neoplasm of other organs or systems: Secondary | ICD-10-CM | POA: Diagnosis not present

## 2015-10-11 DIAGNOSIS — I5033 Acute on chronic diastolic (congestive) heart failure: Secondary | ICD-10-CM | POA: Diagnosis present

## 2015-10-11 DIAGNOSIS — Z7982 Long term (current) use of aspirin: Secondary | ICD-10-CM | POA: Diagnosis not present

## 2015-10-11 DIAGNOSIS — R9431 Abnormal electrocardiogram [ECG] [EKG]: Secondary | ICD-10-CM

## 2015-10-11 DIAGNOSIS — J9601 Acute respiratory failure with hypoxia: Secondary | ICD-10-CM | POA: Diagnosis present

## 2015-10-11 DIAGNOSIS — I341 Nonrheumatic mitral (valve) prolapse: Secondary | ICD-10-CM | POA: Diagnosis present

## 2015-10-11 DIAGNOSIS — Z8249 Family history of ischemic heart disease and other diseases of the circulatory system: Secondary | ICD-10-CM | POA: Diagnosis not present

## 2015-10-11 DIAGNOSIS — I509 Heart failure, unspecified: Secondary | ICD-10-CM

## 2015-10-11 DIAGNOSIS — E871 Hypo-osmolality and hyponatremia: Secondary | ICD-10-CM | POA: Diagnosis present

## 2015-10-11 DIAGNOSIS — I272 Other secondary pulmonary hypertension: Secondary | ICD-10-CM | POA: Diagnosis present

## 2015-10-11 DIAGNOSIS — J449 Chronic obstructive pulmonary disease, unspecified: Secondary | ICD-10-CM | POA: Diagnosis present

## 2015-10-11 DIAGNOSIS — J432 Centrilobular emphysema: Secondary | ICD-10-CM | POA: Diagnosis not present

## 2015-10-11 LAB — CBC
HEMATOCRIT: 38.8 % (ref 35.0–47.0)
HEMATOCRIT: 37.4 % (ref 35.0–47.0)
HEMOGLOBIN: 12.6 g/dL (ref 12.0–16.0)
Hemoglobin: 13.1 g/dL (ref 12.0–16.0)
MCH: 32.8 pg (ref 26.0–34.0)
MCH: 32.7 pg (ref 26.0–34.0)
MCHC: 33.7 g/dL (ref 32.0–36.0)
MCHC: 33.7 g/dL (ref 32.0–36.0)
MCV: 97.5 fL (ref 80.0–100.0)
MCV: 97.1 fL (ref 80.0–100.0)
PLATELETS: 218 10*3/uL (ref 150–440)
Platelets: 293 10*3/uL (ref 150–440)
RBC: 3.98 MIL/uL (ref 3.80–5.20)
RBC: 3.85 MIL/uL (ref 3.80–5.20)
RDW: 13.2 % (ref 11.5–14.5)
RDW: 13.8 % (ref 11.5–14.5)
WBC: 4.3 10*3/uL (ref 3.6–11.0)
WBC: 5 10*3/uL (ref 3.6–11.0)

## 2015-10-11 LAB — BASIC METABOLIC PANEL
Anion gap: 7 (ref 5–15)
BUN: 18 mg/dL (ref 6–20)
CO2: 25 mmol/L (ref 22–32)
Calcium: 8 mg/dL — ABNORMAL LOW (ref 8.9–10.3)
Chloride: 101 mmol/L (ref 101–111)
Creatinine, Ser: 0.44 mg/dL (ref 0.44–1.00)
GFR calc Af Amer: >60 mL/min (ref >60)
GLUCOSE: 136 mg/dL — AB (ref 65–99)
POTASSIUM: 3.6 mmol/L (ref 3.5–5.1)
Sodium: 133 mmol/L — ABNORMAL LOW (ref 135–145)

## 2015-10-11 LAB — COMPREHENSIVE METABOLIC PANEL
ALT: 40 U/L (ref 14–54)
ANION GAP: 5 (ref 5–15)
AST: 41 U/L (ref 15–41)
Albumin: 3.1 g/dL — ABNORMAL LOW (ref 3.5–5.0)
Alkaline Phosphatase: 78 U/L (ref 38–126)
BILIRUBIN TOTAL: 0.4 mg/dL (ref 0.3–1.2)
BUN: 23 mg/dL — ABNORMAL HIGH (ref 6–20)
CALCIUM: 8.4 mg/dL — AB (ref 8.9–10.3)
CO2: 27 mmol/L (ref 22–32)
CREATININE: 0.62 mg/dL (ref 0.44–1.00)
Chloride: 101 mmol/L (ref 101–111)
Glucose, Bld: 106 mg/dL — ABNORMAL HIGH (ref 65–99)
Potassium: 4.1 mmol/L (ref 3.5–5.1)
Sodium: 133 mmol/L — ABNORMAL LOW (ref 135–145)
TOTAL PROTEIN: 6.5 g/dL (ref 6.5–8.1)

## 2015-10-11 LAB — TROPONIN I: Troponin I: <0.03 ng/mL (ref <0.031)

## 2015-10-11 LAB — BRAIN NATRIURETIC PEPTIDE: B Natriuretic Peptide: 1528 pg/mL — ABNORMAL HIGH (ref 0.0–100.0)

## 2015-10-11 MED ORDER — ALBUTEROL SULFATE (2.5 MG/3ML) 0.083% IN NEBU
2.5000 mg | INHALATION_SOLUTION | Freq: Four times a day (QID) | RESPIRATORY_TRACT | Status: DC | PRN
Start: 2015-10-11 — End: 2015-10-12

## 2015-10-11 MED ORDER — ENOXAPARIN SODIUM 40 MG/0.4ML ~~LOC~~ SOLN: 40.0000 mg | SUBCUTANEOUS | Status: DC

## 2015-10-11 MED ORDER — FUROSEMIDE 10 MG/ML IJ SOLN
20.0000 mg | Freq: Once | INTRAMUSCULAR | Status: AC
  Administered 2015-10-11: 20 mg via INTRAVENOUS
  Filled 2015-10-11: qty 4

## 2015-10-11 MED ORDER — METOPROLOL TARTRATE 25 MG PO TABS
12.5000 mg | ORAL_TABLET | Freq: Two times a day (BID) | ORAL | Status: DC
  Administered 2015-10-11 – 2015-10-13 (×5): 12.5 mg via ORAL
  Filled 2015-10-11 (×5): qty 1

## 2015-10-11 MED ORDER — TRAMADOL HCL 50 MG PO TABS
50.0000 mg | ORAL_TABLET | Freq: Two times a day (BID) | ORAL | Status: DC
  Administered 2015-10-11: 50 mg via ORAL
  Filled 2015-10-11: qty 1

## 2015-10-11 MED ORDER — LEVOTHYROXINE SODIUM 25 MCG PO TABS
25.0000 ug | ORAL_TABLET | Freq: Every day | ORAL | Status: DC
  Administered 2015-10-11 – 2015-10-13 (×3): 25 ug via ORAL
  Filled 2015-10-11 (×3): qty 1

## 2015-10-11 MED ORDER — POTASSIUM CHLORIDE CRYS ER 20 MEQ PO TBCR
20.0000 meq | EXTENDED_RELEASE_TABLET | Freq: Two times a day (BID) | ORAL | Status: DC
  Administered 2015-10-11 – 2015-10-13 (×5): 20 meq via ORAL
  Filled 2015-10-11 (×4): qty 1

## 2015-10-11 MED ORDER — ASPIRIN 325 MG PO TABS
325.0000 mg | ORAL_TABLET | Freq: Every day | ORAL | Status: DC
  Administered 2015-10-11 – 2015-10-13 (×3): 325 mg via ORAL
  Filled 2015-10-11 (×3): qty 1

## 2015-10-11 MED ORDER — FUROSEMIDE 10 MG/ML IJ SOLN
20.0000 mg | Freq: Two times a day (BID) | INTRAMUSCULAR | Status: DC
  Administered 2015-10-11: 20 mg via INTRAVENOUS
  Filled 2015-10-11: qty 2

## 2015-10-11 MED ORDER — TRAMADOL HCL 50 MG PO TABS
50.0000 mg | ORAL_TABLET | Freq: Four times a day (QID) | ORAL | Status: DC | PRN
Start: 2015-10-11 — End: 2015-10-13
  Administered 2015-10-11 – 2015-10-12 (×3): 50 mg via ORAL
  Filled 2015-10-11 (×3): qty 1

## 2015-10-11 MED ORDER — DEXTROSE 5 % IV SOLN
500.0000 mg | Freq: Once | INTRAVENOUS | Status: AC
  Administered 2015-10-11: 500 mg via INTRAVENOUS
  Filled 2015-10-11: qty 500

## 2015-10-11 MED ORDER — LEVALBUTEROL HCL 1.25 MG/0.5ML IN NEBU: 1.2500 mg | INHALATION_SOLUTION | Freq: Four times a day (QID) | RESPIRATORY_TRACT | Status: DC | PRN

## 2015-10-11 MED ORDER — RAMIPRIL 1.25 MG PO CAPS
1.2500 mg | ORAL_CAPSULE | Freq: Two times a day (BID) | ORAL | Status: DC
  Administered 2015-10-11 – 2015-10-13 (×4): 1.25 mg via ORAL
  Filled 2015-10-11 (×6): qty 1

## 2015-10-11 MED ORDER — GABAPENTIN 100 MG PO CAPS
100.0000 mg | ORAL_CAPSULE | Freq: Three times a day (TID) | ORAL | Status: DC
  Administered 2015-10-13: 100 mg via ORAL
  Filled 2015-10-11 (×4): qty 1

## 2015-10-11 MED ORDER — LEVOFLOXACIN IN D5W 750 MG/150ML IV SOLN
750.0000 mg | INTRAVENOUS | Status: DC
Start: 2015-10-12 — End: 2015-10-12
  Administered 2015-10-12: 750 mg via INTRAVENOUS
  Filled 2015-10-11: qty 150

## 2015-10-11 MED ORDER — ASPIRIN EC 81 MG PO TBEC: 81.0000 mg | DELAYED_RELEASE_TABLET | Freq: Every day | ORAL | Status: DC

## 2015-10-11 NOTE — Consult Note (Addendum)
Cardiology Consultation Note  Patient ID: SHUNTIA EXTON, MRN: [id number], DOB/AGE: 1931/10/01 81 y.o. Admit date: 10/10/2015   Date of Consult: 10/11/2015 Primary Physician: [name], MD Primary Cardiologist: Dr. Percival Spanish, MD  Chief Complaint: SOB and leg swelling Reason for Consult: Possible CHF  HPI: 81 y.o. female with h/o mitral valve prolapse and regurgitation, diastolic dysfunction, and GERD who recently presented to outside urgent carre 10/28 with intermittent weakness for months. She was diagnosed with a right lower lobe pneumonia at that time and start on Levaquin and discharged from the ED with outpatient follow up. She returned to the ED on 10/31 with increased SOB and leg swelling x 3 days. She was found to have a BNP of 1528. Cardiology is consulted for further evaluation for possible CHF.   She was last seen by cardiology as an outpatient on 08/31/2014 for routine follow up of her mitral valve prolapse and regurgitation. At that time she had no new symptoms and continued to perform her household chores, though had slowed down 2/2 back pain. It was felt that any kind of surgical intervention in the future would be of high risk at that time, thus she was continued on medical therapy. It was considered to repeat an echo at her next appointment.   She presented to outside urgent care on 10/28 with intermittent weakness for weeks to months. 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 CBC unremarkable. No BNP or troponin checked at that time. She was started on Levaquin and advised to follow up in 3 days.   She returned to the ED on 10/31 after following up with her PCP with increased SOB and leg swelling. She complained of new onset lower extremity swelling. She initially thought this was 2/2 the new Levaquin and returned to the urgent care. She was changed to doxycycline. She  followed up with her PCP on 10/31 who felt like the lower extremity swelling was not 2/2 medication and was advised to restart Levaquin. She subsequently presented to [facility] ED later on 10/31 with her lower extremity edema and SOB. No chest pain, cough, palpitations, nausea, vomiting, presyncope, or syncope. She is noted to have had an 8 pound weight gain over the past 1 month along with significant abdominal swelling. No orthopnea or PND. She does not apply excess salt to her foods, though does eat canned foods often.   Upon the patient's arrival to [facility] they were found to have troponin negative x 1, BNP 1528, SCr 0.62-->0.44, K+ 4.1-->3.6, CBC unremarkable, blood culture in process x 2. ECG showed NSR, 67 bpm prolonged PR at 405 msec, right axis deviation, multiform PVCs, low voltage extremity leads , CXR showed mildly worsened basilar opacity superimposed on severe COPD. Suspicious for pneumonia. She was started on azithromycin, Levaquin q 48 hours, and Lasix 20 q 12 hours.     Past Medical History  Diagnosis Date  . Mitral prolapse   . Mitral regurgitation   . Gastro-esophageal reflux   . Pericardial effusion   . Osteoporosis   . Arthritis   . Heart disease       Most Recent Cardiac Studies: Echo 03/17/2013: Study Conclusions  - Left ventricle: The cavity size was normal. Wall thickness was normal. Systolic function was normal. The estimated ejection fraction was in the range of 55% to 60%. Wall motion was normal; there were no regional wall motion abnormalities. Doppler parameters are consistent with abnormal  left ventricular relaxation (grade 1 diastolic dysfunction). - Aortic valve: Mild regurgitation. - Mitral valve: Moderate prolapse, involving the anterior leaflet and the posterior leaflet. Moderate regurgitation. - Right atrium: The atrium was mildly dilated. - Atrial septum: No defect or patent foramen ovale was identified.   Surgical History:  Past Surgical  History  Procedure Laterality Date  . Dilation and curettage of uterus    . Right eye       surgery  . Hysterotomy    . Right breast biopsy    . Tonsillectomy       Home Meds: Prior to Admission medications   Medication Sig Start Date End Date Taking? Authorizing Provider  Gaffer (APPLE CIDER VINEGAR ULTRA) 600 MG CAPS 2 teaspoons once a day   Yes Historical Provider, MD  aspirin 325 MG tablet Take 325 mg by mouth daily.   Yes Historical Provider, MD  doxycycline (VIBRAMYCIN) 100 MG capsule  10/09/15  Yes Historical Provider, MD  gabapentin (NEURONTIN) 100 MG capsule Take 1 capsule (100 mg total) by mouth 3 (three) times daily. 09/27/15  Yes Arlis Porta., MD  levofloxacin (LEVAQUIN) 500 MG tablet Take 1 tablet (500 mg total) by mouth daily. 10/07/15  Yes Norval Gable, MD  levothyroxine (SYNTHROID, LEVOTHROID) 25 MCG tablet Take 1 tablet (25 mcg total) by mouth daily before breakfast. Ran out of meds. 09/27/15  Yes Arlis Porta., MD  metoprolol tartrate (LOPRESSOR) 25 MG tablet Take 0.5 tablets (12.5 mg total) by mouth 2 (two) times daily. 02/17/13  Yes Minus Breeding, MD  Multiple Vitamin (MULTI-VITAMIN DAILY PO) Take by mouth daily.   Yes Historical Provider, MD  [name] (OYSTER CALCIUM) 500 MG TABS Take 500 mg of elemental calcium by mouth 2 (two) times daily.   Yes Historical Provider, MD  traMADol (ULTRAM) 50 MG tablet Take 1 tablet by mouth 2 (two) times daily. 08/23/14  Yes Historical Provider, MD    Inpatient Medications:  . aspirin  325 mg Oral Daily  . enoxaparin (LOVENOX) injection  40 mg Subcutaneous Q24H  . furosemide  20 mg Intravenous Q12H  . gabapentin  100 mg Oral TID  . [START ON 10/12/2015] levofloxacin (LEVAQUIN) IV  750 mg Intravenous Q48H  . levothyroxine  25 mcg Oral QAC breakfast  . metoprolol tartrate  12.5 mg Oral BID  . ramipril  1.25 mg Oral Q12H  . traMADol  50 mg Oral BID      Allergies:  Allergies  Allergen Reactions    . Penicillins     Social History   Social History  . Marital Status: Married    Spouse Name: N/A  . Number of Children: N/A  . Years of Education: N/A   Occupational History  . Not on file.   Social History Main Topics  . Smoking status: Never Smoker   . Smokeless tobacco: Never Used  . Alcohol Use: No  . Drug Use: No  . Sexual Activity: Not on file   Other Topics Concern  . Not on file   Social History Narrative     Family History  Problem Relation Age of Onset  . Heart attack Father   . Throat cancer Sister   . CAD Brother      Review of Systems: Review of Systems  Constitutional: Positive for malaise/fatigue. Negative for fever, chills, weight loss and diaphoresis.       Weight gain of 8 pounds over the past 1 month  HENT: Negative  for congestion.   Eyes: Negative for discharge and redness.  Respiratory: Positive for shortness of breath. Negative for cough, hemoptysis, sputum production and wheezing.   Cardiovascular: Positive for leg swelling. Negative for chest pain, palpitations, orthopnea, claudication and PND.  Gastrointestinal: Negative for heartburn, nausea, vomiting and abdominal pain.       ABD fullness  Musculoskeletal: Negative for falls.  Skin: Negative for rash.  Neurological: Positive for weakness. Negative for dizziness, tingling, tremors, sensory change, speech change, focal weakness and loss of consciousness.  Endo/Heme/Allergies: Does not bruise/bleed easily.  Psychiatric/Behavioral: Negative for substance abuse. The patient is not nervous/anxious.   All other systems reviewed and are negative.    Labs:  Recent Labs  10/11/15 0022  TROPONINI <0.03   Lab Results  Component Value Date   WBC 4.3 10/11/2015   HGB 13.1 10/11/2015   HCT 38.8 10/11/2015   MCV 97.5 10/11/2015   PLT 218 10/11/2015    Recent Labs Lab 10/11/15 0022 10/11/15 0541  NA 133* 133*  K 4.1 3.6  CL 101 101  CO2 27 25  BUN 23* 18  CREATININE 0.62 0.44   CALCIUM 8.4* 8.0*  PROT 6.5  --   BILITOT 0.4  --   ALKPHOS 78  --   ALT 40  --   AST 41  --   GLUCOSE 106* 136*   No results found for: CHOL, HDL, LDLCALC, TRIG No results found for: DDIMER  Radiology/Studies:  Dg Chest 2 View  10/11/2015  CLINICAL DATA:  Dyspnea tonight. Diagnosed with pneumonia several days ago. EXAM: CHEST  2 VIEW COMPARISON:  10/07/2015 FINDINGS: Linear and patchy opacities persist in both bases with mild worsening on the right. This is superimposed on severe hyperinflation. No large effusions are evident. There is unchanged moderate cardiomegaly. There is mild unchanged central vascular prominence. IMPRESSION: Mildly worsened basilar opacity superimposed on severe COPD. Suspicious for pneumonia. Electronically Signed   By: Andreas Newport M.D.   On: 10/11/2015 00:14   Dg Chest 2 View  10/07/2015  CLINICAL DATA:  Acute onset shortness of breath 2 days ago. 1 day history of generalized weakness. EXAM: CHEST  2 VIEW COMPARISON:  12/07/2013. FINDINGS: Cardiac silhouette markedly enlarged, unchanged. Thoracic aorta tortuous and atherosclerotic, unchanged. Markedly enlarged central pulmonary arteries, particularly on the right, unchanged. Linear scarring in both lung bases localizing to the lower lobes, right middle lobe and lingula. New patchy airspace opacities in the right lower lobe. Hyperinflation, emphysematous changes throughout both lungs, and mild central peribronchial thickening, unchanged. Small right pleural effusion. No left pleural effusion. No pneumothorax. Severe osseous demineralization. Thoracolumbar scoliosis convex left. IMPRESSION: 1. 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 Small right parapneumonic effusion. 2. Stable linear scarring in the lower lobes, right middle lobe and lingula. 3. Stable marked cardiomegaly without evidence pulmonary edema. 4. Stable markedly enlarged central pulmonary arteries, right larger than left.  Electronically Signed   By: Evangeline Dakin M.D.   On: 10/07/2015 12:02    EKG: NSR, 67 bpm prolonged PR at 405 msec, right axis deviation, multiform PVCs, low voltage extremity leads   Weights: Filed Weights   10/10/15 2259 10/11/15 0419  Weight: 100 lb (45.36 kg) 100 lb 12.8 oz (45.723 kg)     Physical Exam: Blood pressure 131/64, pulse 71, temperature 97.6 F (36.4 C), temperature source Oral, resp. rate 20, height 5\' 3"  (1.6 m), weight 100 lb 12.8 oz (45.723 kg), SpO2 96 %. Body mass index is 17.86 kg/(m^2). General:  Well developed, well nourished, in no acute distress. Head: Normocephalic, atraumatic, sclera non-icteric, no xanthomas, nares are without discharge.  Neck: Negative for carotid bruits. JVD not elevated. Lungs: Bilateral crackles and right lower lobe rhonchi. Breathing is unlabored. Heart: RRR with S1 S2. II/VI systolic murmur. No rubs, or gallops appreciated. Abdomen: Soft, non-tender, non-distended with normoactive bowel sounds. No hepatomegaly. No rebound/guarding. No obvious abdominal masses. Msk:  Strength and tone appear normal for age. Extremities: No clubbing or cyanosis. No edema.  Distal pedal pulses are 2+ and equal bilaterally. Neuro: Alert and oriented X 3. No facial asymmetry. No focal deficit. Moves all extremities spontaneously. Psych:  Responds to questions appropriately with a normal affect.    Assessment and Plan:   1. Acute respiratory distress with hypoxia: -Likely multifactorial including right lower lobe pneumonia and acute CHF exacerbation -On O2 via nasal cannula currently, wean as able -Ambulate with PT  2. Acute CHF exacerbation: -Echo pending to evaluate LV function, wall motion, and right-sided pressure, BNP 1528 -Agree with gentle diuresis with IV Lasix 20 mg q 12 hours with KCl repletion  -Continue ramipril 1.25 mg q 12 hours -Hold starting beta blocker at this time given her conduction disease with PR interval of >400 msec  (possibly in the setting of her underlying infection) -Limit fluids, IV fluids have been discontinued   3. Prolonged PR interval: -Possibly in the setting of her underlying infection -Continue to monitor on telemetry -Heart rates on telemetry are inaccurate as they are measuring the P waves as QRS complexes  -If PR interval remains prolonged on 11/3 as infections begins to clear will have EP see patient (EP consult at this time would likely have minimal benefit given her stable vitals and underlying infection)  4. Right lower lobe pneumonia: -On azithromycin and Levaquin per IM -Consider changing albuterol to Xopenex given her above conduction disease -Wean oxygen as able  -5. History of mitral regurgitation: -Check echo as above -Previously felt to be medical candidate only given high risk of intervention should this be required    SignedChristell Faith, PA-C Pager: [phone] 10/11/2015, 7:44 AM    Attending Note Patient seen and examined, agree with detailed note above,  Patient presentation and plan discussed on rounds.    patient presenting with increasing shortness of breath, leg edema consistent with acute on chronic diastolic CHF.  In addition has possible pneumonia, underlying COPD.  BNP elevated  Prolonged PR interval on EKG ,  Not symptomatic though will monitor -- agree with above, Lasix for gentle diuresis  Close monitoring of renal function , limit IV fluids  echocardiogram pending  Signed: Esmond Plants  M.D., Ph.D. [facility]

## 2015-10-11 NOTE — Progress Notes (Signed)
Patient arrived to unit from ED. Patient alert and oriented. Skin CDI. Cardiac monitoring placed. IV antibiotics infusing. No acute distress noted.

## 2015-10-11 NOTE — ED Notes (Signed)
Admitting doctor in with patient.  

## 2015-10-11 NOTE — H&P (Addendum)
Jennifer Skinner    MR#:  [id number]  DATE OF BIRTH:  [birth year]  DATE OF ADMISSION:  10/10/2015  PRIMARY CARE PHYSICIAN: [name], MD   REQUESTING/REFERRING PHYSICIAN: Loney Hering, MD  CHIEF COMPLAINT:   Chief Complaint  Patient presents with  . Shortness of Breath   shortness of breath and leg swelling 3 days  HISTORY OF PRESENT ILLNESS:  Jennifer Skinner  is a 81 y.o. female with a known history of mitral wall regurgitation and prolapse, pericardial effusion and arthritis. The patient has had cough with sputum and shortness of breath for 3 days. He was diagnosed with pneumonia 3 days ago, was treated with the Levaquin by mouth without improvement. In addition, she noticed leg swelling for 3-4 days. But she denies any chest pain, palpitation, orthopnea or nocturnal dyspnea. She denies any fever or chills. Chest x-ray show worsening bibasilar opacity. She was treated with Zithromax and Lasix in ED.  PAST MEDICAL HISTORY:   Past Medical History  Diagnosis Date  . Mitral prolapse   . Mitral regurgitation   . Gastro-esophageal reflux   . Pericardial effusion   . Osteoporosis   . Arthritis   . Heart disease     PAST SURGICAL HISTORY:   Past Surgical History  Procedure Laterality Date  . Dilation and curettage of uterus    . Right eye       surgery  . Hysterotomy    . Right breast biopsy    . Tonsillectomy      SOCIAL HISTORY:   Social History  Substance Use Topics  . Smoking status: Never Smoker   . Smokeless tobacco: Never Used  . Alcohol Use: No    FAMILY HISTORY:   Family History  Problem Relation Age of Onset  . Heart attack Father   . Throat cancer Sister   . CAD Brother     DRUG ALLERGIES:   Allergies  Allergen Reactions  . Penicillins     REVIEW OF SYSTEMS:  CONSTITUTIONAL: No fever,but has generalized  weakness.  EYES: No blurred or double vision.   EARS, NOSE, AND THROAT: No tinnitus or ear pain.  RESPIRATORY:Hash, shortness of breath,  but no wheezing or hemoptysis.  CARDIOVASCULAR: No chest pain, orthopnea,but has leg edema.  GASTROINTESTINAL: No nausea, vomiting, diarrhea or abdominal pain.  GENITOURINARY: No dysuria, hematuria.  ENDOCRINE: No polyuria, nocturia,  HEMATOLOGY: No anemia, easy bruising or bleeding SKIN: No rash or lesion. MUSCULOSKELETAL: No joint pain or arthritis.   NEUROLOGIC: No tingling, numbness, weakness.  PSYCHIATRY: No anxiety or depression.   MEDICATIONS AT HOME:   Prior to Admission medications   Medication Sig Start Date End Date Taking? Authorizing Provider  Gaffer (APPLE CIDER VINEGAR ULTRA) 600 MG CAPS 2 teaspoons once a day   Yes Historical Provider, MD  aspirin 325 MG tablet Take 325 mg by mouth daily.   Yes Historical Provider, MD  doxycycline (VIBRAMYCIN) 100 MG capsule  10/09/15  Yes Historical Provider, MD  gabapentin (NEURONTIN) 100 MG capsule Take 1 capsule (100 mg total) by mouth 3 (three) times daily. 09/27/15  Yes Arlis Porta., MD  levofloxacin (LEVAQUIN) 500 MG tablet Take 1 tablet (500 mg total) by mouth daily. 10/07/15  Yes Norval Gable, MD  levothyroxine (SYNTHROID, LEVOTHROID) 25 MCG tablet Take 1 tablet (25 mcg total) by mouth daily before breakfast. Ran out of meds. 09/27/15  Yes Jeneen Rinks  Milagros Loll., MD  metoprolol tartrate (LOPRESSOR) 25 MG tablet Take 0.5 tablets (12.5 mg total) by mouth 2 (two) times daily. 02/17/13  Yes Minus Breeding, MD  Multiple Vitamin (MULTI-VITAMIN DAILY PO) Take by mouth daily.   Yes Historical Provider, MD  [name] (OYSTER CALCIUM) 500 MG TABS Take 500 mg of elemental calcium by mouth 2 (two) times daily.   Yes Historical Provider, MD  traMADol (ULTRAM) 50 MG tablet Take 1 tablet by mouth 2 (two) times daily. 08/23/14  Yes Historical Provider, MD      VITAL SIGNS:  Blood pressure 109/62, pulse 62, temperature 98.3 F (36.8  C), temperature source Oral, resp. rate 15, height 5\' 3"  (1.6 m), weight 45.36 kg (100 lb), SpO2 99 %.  PHYSICAL EXAMINATION:  GENERAL:  81 y.o.-year-old patient lying in the bed with no acute distress.  EYES: Pupils equal, round, reactive to light and accommodation. No scleral icterus. Extraocular muscles intact.  HEENT: Head atraumatic, normocephalic. Oropharynx and nasopharynx clear. Moist oral mucosa  NECK:  Supple, no jugular venous distention. No thyroid enlargement, no tenderness.  LUNGS: Normal breath sounds bilaterally,the basilar  Rales. No use of accessory muscles of respiration.  CARDIOVASCULAR: S1, S2 normal. 3/6 murmurs, no rubs, or gallops.  ABDOMEN: Soft, nontender, nondistended. Bowel sounds present. No organomegaly or mass.  EXTREMITIES:Bilateral lower extremity  edema 1+,  no cyanosis, or clubbing.  NEUROLOGIC: Cranial nerves II through XII are intact. Muscle strength 5/5 in all extremities. Sensation intact. Gait not checked.  PSYCHIATRIC: The patient is alert and oriented x 3.  SKIN: No obvious rash, lesion, or ulcer.   LABORATORY PANEL:   CBC  Recent Labs Lab 10/11/15 0022  WBC 5.0  HGB 12.6  HCT 37.4  PLT 293   ------------------------------------------------------------------------------------------------------------------  Chemistries   Recent Labs Lab 10/11/15 0022  NA 133*  K 4.1  CL 101  CO2 27  GLUCOSE 106*  BUN 23*  CREATININE 0.62  CALCIUM 8.4*  AST 41  ALT 40  ALKPHOS 78  BILITOT 0.4   ------------------------------------------------------------------------------------------------------------------  Cardiac Enzymes  Recent Labs Lab 10/11/15 0022  TROPONINI <0.03   ------------------------------------------------------------------------------------------------------------------  RADIOLOGY:  Dg Chest 2 View  10/11/2015  CLINICAL DATA:  Dyspnea tonight. Diagnosed with pneumonia several days ago. EXAM: CHEST  2 VIEW COMPARISON:   10/07/2015 FINDINGS: Linear and patchy opacities persist in both bases with mild worsening on the right. This is superimposed on severe hyperinflation. No large effusions are evident. There is unchanged moderate cardiomegaly. There is mild unchanged central vascular prominence. IMPRESSION: Mildly worsened basilar opacity superimposed on severe COPD. Suspicious for pneumonia. Electronically Signed   By: Andreas Newport M.D.   On: 10/11/2015 00:14    EKG:   Orders placed or performed during the hospital encounter of 10/10/15  . ED EKG  . ED EKG  . EKG 12-Lead  . EKG 12-Lead  . EKG 12-Lead  . EKG 12-Lead    IMPRESSION AND PLAN:   Pneumonia (CAP) Possible acute CHF Hyponatremia History of mitral valve regurgitation and prolapse History of pericardial effusion  The patient will be admitted to medical floor with telemetry monitor.  I will start Levaquin IV, Xopenex, follow-up CBC, sputum culture and blood culture. I will start CHF protocol, start Lasix 20 mg IV twice a day, get an echocardiogram and cardiology consult. Follow up BMP.  All the records are reviewed and case discussed with ED provider. Management plans discussed with the patient, her husband and they are in agreement.  CODE STATUS: Full code  TOTAL TIME TAKING CARE OF THIS PATIENT: 57 minutes.    Demetrios Loll M.D on 10/11/2015 at 3:11 AM  Between 7am to 6pm - Pager - [phone]  After 6pm go to [email or web address] - password [email or web address] [facility]  Office  [phone]  CC: Primary care physician; [name], MD

## 2015-10-11 NOTE — Progress Notes (Signed)
*  PRELIMINARY RESULTS* Echocardiogram 2D Echocardiogram has been performed.  Jennifer Skinner 10/11/2015, 2:06 PM

## 2015-10-11 NOTE — Progress Notes (Signed)
Patient on tele.Patient has converted from [place] to [facility]. Patient asympathic and denies chest pain. Dr. Bridgett Larsson notified no new orders giving. Per Dr. Bridgett Larsson nursing continue to monitor patient. Nursing supervisor Stephane rounded on patient.Charge nurse [facility] aware.  Patient HR 108-125, PR. 0.43.

## 2015-10-11 NOTE — ED Provider Notes (Signed)
[facility] Emergency Department Provider Note  ____________________________________________  Time seen: Approximately 2315 PM  I have reviewed the triage vital signs and the nursing notes.   HISTORY  Chief Complaint Shortness of Breath    HPI Jennifer Skinner is a 81 y.o. female who has been diagnosed with pneumonia. She reports that she was seen at urgent care on Friday started on an antibiotic that starts with an L. The patient reports that she saw her primary care physician today and was told that her heart rate was a little fast. She reports that at both urgent care in her 31 office she was told that if her breathing seemed to get worse she should come into the emergency department for further evaluation. The patient reports that tonight her breathing seemed not quite right. She reports that she was having a hard time getting a full breath and her feet have been swelling. The patient denies a fever at home has been nauseous with no vomiting she denies any chest pain but occasionally gets dizzy. The patient was concerned about her symptoms as well as the diagnosis of pneumonia so she decided to come in for further evaluation.   Past Medical History  Diagnosis Date  . Mitral prolapse   . Mitral regurgitation   . Gastro-esophageal reflux   . Pericardial effusion   . Osteoporosis   . Arthritis   . Heart disease     Patient Active Problem List   Diagnosis Date Noted  . Pneumonia 10/10/2015  . Hypothyroidism 09/27/2015  . NONSPECIFIC ABN FINDNG RAD&OTH EXAM BILARY TRCT 05/30/2010  . NONSPECIFIC ABNORM RESULTS OTH SPEC FUNCT STUDY 05/30/2010  . RUQ PAIN 05/16/2010  . DYSPNEA 11/15/2009  . MITRAL REGURGITATION 04/12/2009  . PERICARDIAL EFFUSION 04/12/2009  . MITRAL VALVE PROLAPSE 04/12/2009  . GERD 04/12/2009    Past Surgical History  Procedure Laterality Date  . Dilation and curettage of uterus    . Right eye       surgery  . Hysterotomy     . Right breast biopsy    . Tonsillectomy      Current Outpatient Rx  Name  Route  Sig  Dispense  Refill  . Apple Cider Vinegar (APPLE CIDER VINEGAR ULTRA) 600 MG CAPS      2 teaspoons once a day         . aspirin 325 MG tablet   Oral   Take 325 mg by mouth daily.         Marland Kitchen doxycycline (VIBRAMYCIN) 100 MG capsule               . gabapentin (NEURONTIN) 100 MG capsule   Oral   Take 1 capsule (100 mg total) by mouth 3 (three) times daily.   90 capsule   3   . levofloxacin (LEVAQUIN) 500 MG tablet   Oral   Take 1 tablet (500 mg total) by mouth daily.   7 tablet   0   . levothyroxine (SYNTHROID, LEVOTHROID) 25 MCG tablet   Oral   Take 1 tablet (25 mcg total) by mouth daily before breakfast. Ran out of meds.   30 tablet   6   . metoprolol tartrate (LOPRESSOR) 25 MG tablet   Oral   Take 0.5 tablets (12.5 mg total) by mouth 2 (two) times daily.   90 tablet   3   . Multiple Vitamin (MULTI-VITAMIN DAILY PO)   Oral   Take by mouth daily.         Marland Kitchen  Oyster Shell (OYSTER CALCIUM) 500 MG TABS   Oral   Take 500 mg of elemental calcium by mouth 2 (two) times daily.         . traMADol (ULTRAM) 50 MG tablet   Oral   Take 1 tablet by mouth 2 (two) times daily.           Allergies Penicillins  No family history on file.  Social History Social History  Substance Use Topics  . Smoking status: Never Smoker   . Smokeless tobacco: Never Used  . Alcohol Use: No    Review of Systems Constitutional: No fever/chills Eyes: No visual changes. ENT: No sore throat. Cardiovascular: Denies chest pain. Respiratory: cough and shortness of breath. Gastrointestinal: No abdominal pain.  No nausea, no vomiting.  No diarrhea.  No constipation. Genitourinary: Negative for dysuria. Musculoskeletal: Negative for back pain. Skin: Negative for rash. Neurological: Negative for headaches, focal weakness or numbness.  10-point ROS otherwise  negative.  ____________________________________________   PHYSICAL EXAM:  VITAL SIGNS: ED Triage Vitals  Enc Vitals Group     BP 10/10/15 2259 115/66 mmHg     Pulse Rate 10/10/15 2259 99     Resp 10/10/15 2259 30     Temp 10/10/15 2259 98.3 F (36.8 C)     Temp Source 10/10/15 2259 Oral     SpO2 10/10/15 2259 93 %     Weight 10/10/15 2259 100 lb (45.36 kg)     Height 10/10/15 2259 5\' 3"  (1.6 m)     Head Cir --      Peak Flow --      Pain Score 10/10/15 2325 0     Pain Loc --      Pain Edu? --      Excl. in [place]? --     Constitutional: Alert and oriented. Well appearing and in moderate distress. Eyes: Conjunctivae are normal. PERRL. EOMI. Head: Atraumatic. Nose: No congestion/rhinnorhea. Mouth/Throat: Mucous membranes are moist.  Oropharynx non-erythematous. Cardiovascular: Normal rate, regular rhythm. Systolic murmur.  Good peripheral circulation. Respiratory: Normal respiratory effort.  No retractions. Crackles in bilateral bases Gastrointestinal: Soft and nontender. No distention. Positive bowel sounds Musculoskeletal: b/l LE edema   Neurologic:  Normal speech and language. No gross focal neurologic deficits are appreciated.  Skin:  Skin is warm, dry and intact.  Psychiatric: Mood and affect are normal. Speech and behavior are normal.  ____________________________________________   LABS (all labs ordered are listed, but only abnormal results are displayed)  Labs Reviewed  COMPREHENSIVE METABOLIC PANEL - Abnormal; Notable for the following:    Sodium 133 (*)    Glucose, Bld 106 (*)    BUN 23 (*)    Calcium 8.4 (*)    Albumin 3.1 (*)    All other components within normal limits  BRAIN NATRIURETIC PEPTIDE - Abnormal; Notable for the following:    B Natriuretic Peptide 1528.0 (*)    All other components within normal limits  CULTURE, BLOOD (ROUTINE X 2)  CULTURE, BLOOD (ROUTINE X 2)  CBC  TROPONIN I   ____________________________________________  EKG  ED  ECG REPORT I, Loney Hering, the attending physician, personally viewed and interpreted this ECG.   Date: 10/10/2015  EKG Time: 2327  Rate: 67  Rhythm: normal sinus rhythm, 1st degree AV block  Axis: right axis deviation  Intervals:first-degree A-V block   ST&T Change: none  ____________________________________________  RADIOLOGY  Chest x-ray: Mildly worsened basilar opacity superimposed on severe COPD, suspicious for  pneumonia ____________________________________________   PROCEDURES  Procedure(s) performed: None  Critical Care performed: No  ____________________________________________   INITIAL IMPRESSION / ASSESSMENT AND PLAN / ED COURSE  Pertinent labs & imaging results that were available during my care of the patient were reviewed by me and considered in my medical decision making (see chart for details).  This is an 81 year old female who comes in today with pneumonia and some difficulty breathing. The patient reports that she was told if her breathing was worse she should come into the hospital. The patient has had some hypoxia into the high 80s and does have some crackles in her bases. Given the patient's leg swelling I did feel that a BNP was important and it does show some elevation. I did give the patient some Lasix 20 mg IV 1 dose and we'll give her azithromycin and meropenem. I will admit the patient to the hospitalist service given her pneumonia, heart failure and hypoxia. I discussed this with Dr. Jannifer Franklin and he agrees with the plan as stated. ____________________________________________   FINAL CLINICAL IMPRESSION(S) / ED DIAGNOSES  Final diagnoses:  Right lower lobe pneumonia  Acute congestive heart failure, unspecified congestive heart failure type (HCC)      Loney Hering, MD 10/11/15 [phone]

## 2015-10-11 NOTE — Progress Notes (Signed)
[facility] [place] NAME: Jennifer Skinner    MR#:  [id number]  DATE OF BIRTH:  July 27, 1931  SUBJECTIVE: Admitted for acute on chronic diastolic heart failure, pneumonia. Patient has shortness of breath, leg edema at home. Today she feels better than yesterday. No chest pain, no shortness of breath, decreased leg edema.   CHIEF COMPLAINT:   Chief Complaint  Patient presents with  . Shortness of Breath    REVIEW OF SYSTEMS:   ROS CONSTITUTIONAL: No fever, fatigue or weakness.  EYES: No blurred or double vision.  EARS, NOSE, AND THROAT: No tinnitus or ear pain.  RESPIRATORY: Shortness of breath, leg edema. CARDIOVASCULAR: No chest pain, orthopnea, edema.  GASTROINTESTINAL: No nausea, vomiting, diarrhea or abdominal pain.  GENITOURINARY: No dysuria, hematuria.  ENDOCRINE: No polyuria, nocturia,  HEMATOLOGY: No anemia, easy bruising or bleeding SKIN: No rash or lesion. MUSCULOSKELETAL: No joint pain or arthritis.   NEUROLOGIC: No tingling, numbness, weakness.  PSYCHIATRY: No anxiety or depression.   DRUG ALLERGIES:   Allergies  Allergen Reactions  . Penicillins     VITALS:  Blood pressure 101/60, pulse 91, temperature 97.9 F (36.6 C), temperature source Oral, resp. rate 18, height 5\' 3"  (1.6 m), weight 45.723 kg (100 lb 12.8 oz), SpO2 94 %.  PHYSICAL EXAMINATION:  GENERAL:  81 y.o.-year-old patient lying in the bed with no acute distress.  EYES: Pupils equal, round, reactive to light and accommodation. No scleral icterus. Extraocular muscles intact.  HEENT: Head atraumatic, normocephalic. Oropharynx and nasopharynx clear.  NECK:  Supple, no jugular venous distention. No thyroid enlargement, no tenderness.  LUNGS: Normal breath sounds bilaterally, no wheezing, rales,rhonchi or crepitation. No use of accessory muscles of respiration.  CARDIOVASCULAR: S1, S2 normal. No murmurs, rubs, or gallops.  ABDOMEN: Soft, nontender,  nondistended. Bowel sounds present. No organomegaly or mass.  EXTREMITIES: Trace pedal edema present NEUROLOGIC: Cranial nerves II through XII are intact. Muscle strength 5/5 in all extremities. Sensation intact. Gait not checked.  PSYCHIATRIC: The patient is alert and oriented x 3.  SKIN: No obvious rash, lesion, or ulcer.    LABORATORY PANEL:   CBC  Recent Labs Lab 10/11/15 0541  WBC 4.3  HGB 13.1  HCT 38.8  PLT 218   ------------------------------------------------------------------------------------------------------------------  Chemistries   Recent Labs Lab 10/11/15 0022 10/11/15 0541  NA 133* 133*  K 4.1 3.6  CL 101 101  CO2 27 25  GLUCOSE 106* 136*  BUN 23* 18  CREATININE 0.62 0.44  CALCIUM 8.4* 8.0*  AST 41  --   ALT 40  --   ALKPHOS 78  --   BILITOT 0.4  --    ------------------------------------------------------------------------------------------------------------------  Cardiac Enzymes  Recent Labs Lab 10/11/15 0022  TROPONINI <0.03   ------------------------------------------------------------------------------------------------------------------  RADIOLOGY:  Dg Chest 2 View  10/11/2015  CLINICAL DATA:  Dyspnea tonight. Diagnosed with pneumonia several days ago. EXAM: CHEST  2 VIEW COMPARISON:  10/07/2015 FINDINGS: Linear and patchy opacities persist in both bases with mild worsening on the right. This is superimposed on severe hyperinflation. No large effusions are evident. There is unchanged moderate cardiomegaly. There is mild unchanged central vascular prominence. IMPRESSION: Mildly worsened basilar opacity superimposed on severe COPD. Suspicious for pneumonia. Electronically Signed   By: Andreas Newport M.D.   On: 10/11/2015 00:14    EKG:   Orders placed or performed during the hospital encounter of 10/10/15  . ED EKG  . ED EKG  . EKG 12-Lead  .  EKG 12-Lead  . EKG 12-Lead  . EKG 12-Lead    ASSESSMENT AND PLAN:   1.Acute   respiratory failure with hypoxia secondary to acute CHF exacerbation, right lower lobe pneumonia. Continue oxygen, continue IV Lasix, follow echocardiogram results. Hold Beta blocker secondary to acute CHF.   Continue IV antibiotics for pneumonia.  #2 right lower lobe pneumonia continue Levaquin, azithromycin, oxygen, follow culture data.  3. GERD; continue PPIs.  DVT prophylaxis.  All the records are reviewed and case discussed with Care Management/Social Workerr. Management plans discussed with the patient, family and they are in agreement.  CODE STATUS: full  TOTAL TIME TAKING CARE OF THIS PATIENT: 35  minutes.   POSSIBLE D/C IN 1-2 DAYS, DEPENDING ON CLINICAL CONDITION.   Epifanio Lesches M.D on 10/11/2015 at 12:36 PM  Between 7am to 6pm - Pager - [phone]  After 6pm go to [email or web address] - password [email or web address] [facility]  Office  [phone]  CC: Primary care physician; [name], MD   Note: This dictation was prepared with Dragon dictation along with smaller phrase technology. Any transcriptional errors that result from this process are unintentional.

## 2015-10-11 NOTE — ED Notes (Signed)
Final report of urine C&S shows e-coli. Patient in house admit for other issues 11-1. UTI covered by Levaquin Rx

## 2015-10-12 ENCOUNTER — Telehealth: Payer: Self-pay

## 2015-10-12 DIAGNOSIS — J189 Pneumonia, unspecified organism: Principal | ICD-10-CM

## 2015-10-12 DIAGNOSIS — J432 Centrilobular emphysema: Secondary | ICD-10-CM

## 2015-10-12 DIAGNOSIS — I5033 Acute on chronic diastolic (congestive) heart failure: Secondary | ICD-10-CM

## 2015-10-12 DIAGNOSIS — J90 Pleural effusion, not elsewhere classified: Secondary | ICD-10-CM

## 2015-10-12 DIAGNOSIS — I272 Other secondary pulmonary hypertension: Secondary | ICD-10-CM

## 2015-10-12 LAB — BASIC METABOLIC PANEL
Anion gap: 6 (ref 5–15)
BUN: 15 mg/dL (ref 6–20)
CALCIUM: 7.9 mg/dL — AB (ref 8.9–10.3)
CHLORIDE: 103 mmol/L (ref 101–111)
CO2: 26 mmol/L (ref 22–32)
CREATININE: 0.39 mg/dL — AB (ref 0.44–1.00)
Glucose, Bld: 95 mg/dL (ref 65–99)
Potassium: 3.9 mmol/L (ref 3.5–5.1)
SODIUM: 135 mmol/L (ref 135–145)

## 2015-10-12 MED ORDER — ALBUTEROL SULFATE (2.5 MG/3ML) 0.083% IN NEBU
3.0000 mL | INHALATION_SOLUTION | RESPIRATORY_TRACT | Status: DC
  Administered 2015-10-13: 3 mL via RESPIRATORY_TRACT
  Filled 2015-10-12 (×2): qty 3

## 2015-10-12 MED ORDER — POTASSIUM CHLORIDE CRYS ER 20 MEQ PO TBCR
20.0000 meq | EXTENDED_RELEASE_TABLET | Freq: Once | ORAL | Status: AC
  Administered 2015-10-12: 20 meq via ORAL
  Filled 2015-10-12: qty 1

## 2015-10-12 MED ORDER — POLYETHYLENE GLYCOL 3350 17 G PO PACK
17.0000 g | PACK | Freq: Every day | ORAL | Status: DC
Start: 2015-10-12 — End: 2015-10-13
  Filled 2015-10-12 (×3): qty 1

## 2015-10-12 MED ORDER — ALBUTEROL SULFATE (2.5 MG/3ML) 0.083% IN NEBU: 2.5000 mg | INHALATION_SOLUTION | Freq: Four times a day (QID) | RESPIRATORY_TRACT | Status: DC | PRN

## 2015-10-12 MED ORDER — FUROSEMIDE 20 MG PO TABS: 20.0000 mg | ORAL_TABLET | Freq: Two times a day (BID) | ORAL | Status: DC

## 2015-10-12 MED ORDER — ALUM & MAG HYDROXIDE-SIMETH 200-200-20 MG/5ML PO SUSP
30.0000 mL | Freq: Four times a day (QID) | ORAL | Status: DC | PRN
  Filled 2015-10-12: qty 30

## 2015-10-12 MED ORDER — LEVOFLOXACIN 750 MG PO TABS: 750.0000 mg | ORAL_TABLET | ORAL | Status: DC

## 2015-10-12 MED ORDER — FUROSEMIDE 10 MG/ML IJ SOLN
20.0000 mg | Freq: Two times a day (BID) | INTRAMUSCULAR | Status: DC
  Administered 2015-10-12 – 2015-10-13 (×3): 20 mg via INTRAVENOUS
  Filled 2015-10-12 (×3): qty 2

## 2015-10-12 MED ORDER — LEVOFLOXACIN 500 MG PO TABS: 750.0000 mg | ORAL_TABLET | ORAL | Status: DC

## 2015-10-12 MED ORDER — POTASSIUM CHLORIDE CRYS ER 20 MEQ PO TBCR: 20.0000 meq | EXTENDED_RELEASE_TABLET | Freq: Two times a day (BID) | ORAL | Status: DC

## 2015-10-12 MED ORDER — RAMIPRIL 1.25 MG PO CAPS: 1.2500 mg | ORAL_CAPSULE | Freq: Two times a day (BID) | ORAL | Status: DC

## 2015-10-12 NOTE — Progress Notes (Signed)
[facility] [place] NAME: Jennifer Skinner    MR#:  [id number]  DATE OF BIRTH:  Dec 04, 1931  SUBJECTIVE: Admitted for acute on chronic diastolic heart failure, pneumonia.feels better today.deniesSOB.  CHIEF COMPLAINT:   Chief Complaint  Patient presents with  . Shortness of Breath    REVIEW OF SYSTEMS:   ROS CONSTITUTIONAL: No fever, fatigue or weakness.  EYES: No blurred or double vision.  EARS, NOSE, AND THROAT: No tinnitus or ear pain.  RESPIRATORY: Shortness of breath, leg edema. CARDIOVASCULAR: No chest pain, orthopnea, edema.  GASTROINTESTINAL: No nausea, vomiting, diarrhea or abdominal pain.  GENITOURINARY: No dysuria, hematuria.  ENDOCRINE: No polyuria, nocturia,  HEMATOLOGY: No anemia, easy bruising or bleeding SKIN: No rash or lesion. MUSCULOSKELETAL: No joint pain or arthritis.   NEUROLOGIC: No tingling, numbness, weakness.  PSYCHIATRY: No anxiety or depression.   DRUG ALLERGIES:   Allergies  Allergen Reactions  . Penicillins     VITALS:  Blood pressure 133/57, pulse 70, temperature 97.4 F (36.3 C), temperature source Oral, resp. rate 18, height 5\' 3"  (1.6 m), weight 46.448 kg (102 lb 6.4 oz), SpO2 92 %.  PHYSICAL EXAMINATION:  GENERAL:  81 y.o.-year-old patient lying in the bed with no acute distress.  EYES: Pupils equal, round, reactive to light and accommodation. No scleral icterus. Extraocular muscles intact.  HEENT: Head atraumatic, normocephalic. Oropharynx and nasopharynx clear.  NECK:  Supple, no jugular venous distention. No thyroid enlargement, no tenderness.  LUNGS: Normal breath sounds bilaterally, no wheezing, rales,rhonchi or crepitation. No use of accessory muscles of respiration.  CARDIOVASCULAR: S1, S2 normal. No murmurs, rubs, or gallops.  ABDOMEN: Soft, nontender, nondistended. Bowel sounds present. No organomegaly or mass.  EXTREMITIES: Trace pedal edema present NEUROLOGIC: Cranial nerves  II through XII are intact. Muscle strength 5/5 in all extremities. Sensation intact. Gait not checked.  PSYCHIATRIC: The patient is alert and oriented x 3.  SKIN: No obvious rash, lesion, or ulcer.    LABORATORY PANEL:   CBC  Recent Labs Lab 10/11/15 0541  WBC 4.3  HGB 13.1  HCT 38.8  PLT 218   ------------------------------------------------------------------------------------------------------------------  Chemistries   Recent Labs Lab 10/11/15 0022  10/12/15 0549  NA 133*  < > 135  K 4.1  < > 3.9  CL 101  < > 103  CO2 27  < > 26  GLUCOSE 106*  < > 95  BUN 23*  < > 15  CREATININE 0.62  < > 0.39*  CALCIUM 8.4*  < > 7.9*  AST 41  --   --   ALT 40  --   --   ALKPHOS 78  --   --   BILITOT 0.4  --   --   < > = values in this interval not displayed. ------------------------------------------------------------------------------------------------------------------  Cardiac Enzymes  Recent Labs Lab 10/11/15 0022  TROPONINI <0.03   ------------------------------------------------------------------------------------------------------------------  RADIOLOGY:  Dg Chest 2 View  10/11/2015  CLINICAL DATA:  Dyspnea tonight. Diagnosed with pneumonia several days ago. EXAM: CHEST  2 VIEW COMPARISON:  10/07/2015 FINDINGS: Linear and patchy opacities persist in both bases with mild worsening on the right. This is superimposed on severe hyperinflation. No large effusions are evident. There is unchanged moderate cardiomegaly. There is mild unchanged central vascular prominence. IMPRESSION: Mildly worsened basilar opacity superimposed on severe COPD. Suspicious for pneumonia. Electronically Signed   By: Andreas Newport M.D.   On: 10/11/2015 00:14    EKG:   Orders  placed or performed during the hospital encounter of 10/10/15  . ED EKG  . ED EKG  . EKG 12-Lead  . EKG 12-Lead  . EKG 12-Lead  . EKG 12-Lead  . EKG 12-Lead  . EKG 12-Lead    ASSESSMENT AND PLAN:   1.Acute   respiratory failure with hypoxia secondary to acute CHF exacerbation, right lower lobe pneumonia. Continue oxygen, continue IV Lasix,  For another 24 hrs ,foEF more than 55%  Continue IV antibiotics for pneumonia.  #2 right lower lobe pneumonia continue Levaquin, azithromycin, oxygen, follow culture data. Change to Xopenox neb,o2 sats on ambulation on RA  3. GERD; continue PPIs.  DVT prophylaxis.  All the records are reviewed and case discussed with Care Management/Social Workerr. Management plans discussed with the patient, family and they are in agreement.  CODE STATUS: full  TOTAL TIME TAKING CARE OF THIS PATIENT: 35  minutes.   POSSIBLE D/C IN 1-2 DAYS, DEPENDING ON CLINICAL CONDITION.   Epifanio Lesches M.D on 10/12/2015 at 7:43 PM  Between 7am to 6pm - Pager - [phone]  After 6pm go to [email or web address] - password [email or web address]  Office  [phone]  CC: Primary care physician; [name], MD   Note: This dictation was prepared with Dragon dictation along with smaller phrase technology. Any transcriptional errors that result from this process are unintentional.

## 2015-10-12 NOTE — Progress Notes (Addendum)
Dr Rockey Situ thought that the pt needed one more day so she could get rid of more fluid. Dr Vianne Bulls cancelled the discharge. sats remained 91-94% and greater when ambulating without oxygen

## 2015-10-12 NOTE — Progress Notes (Signed)
Patient qualified for IV to PO transition per IV to PO policy. Will transition patient to PO levofloxacin therapy.   Larene Beach, PharmD

## 2015-10-12 NOTE — Plan of Care (Signed)
Problem: Phase I Progression Outcomes Goal: Dyspnea controlled at rest Outcome: Completed/Met Date Met:  10/12/15 Patient ambulated around the nursing station. 96% Room air. No acute signs of distress noted.

## 2015-10-12 NOTE — Progress Notes (Signed)
MD said to discontinue telemetry earlier today

## 2015-10-12 NOTE — Telephone Encounter (Signed)
Attempted to contact pt regarding discharge from [facility] on 10/12/15. Left message for pt to call back w/ any questions or concerns regarding discharge instructions and/or medications.  Advised her of appt w/ Christell Faith, PA on 10/19/15 @ 2:00. Asked her to call back if she will be unable to keep this appt.

## 2015-10-12 NOTE — Progress Notes (Signed)
Patient: Jennifer Skinner / Admit Date: 10/10/2015 / Date of Encounter: 10/12/2015, 10:12 AM   Subjective: She appears SOB at rest. Echo showed elevated right side pressure of 56 mm Hg, 6.1 cm pleural effusion. With ambulation her pulse ox dropped to 91% on room air.   Review of Systems: Review of Systems  Constitutional: Positive for malaise/fatigue. Negative for fever, chills, weight loss and diaphoresis.  HENT: Negative for congestion.   Eyes: Negative for discharge and redness.  Respiratory: Positive for shortness of breath. Negative for cough, hemoptysis, sputum production and wheezing.   Cardiovascular: Negative for chest pain, palpitations, orthopnea, claudication, leg swelling and PND.  Gastrointestinal: Negative for nausea and vomiting.  Musculoskeletal: Negative for falls.  Neurological: Positive for weakness. Negative for dizziness, sensory change, speech change and focal weakness.  Endo/Heme/Allergies: Does not bruise/bleed easily.  Psychiatric/Behavioral: The patient is not nervous/anxious.      Objective: Telemetry: not on telemetry Physical Exam: Blood pressure 124/62, pulse 76, temperature 97.3 F (36.3 C), temperature source Oral, resp. rate 18, height 5\' 3"  (1.6 m), weight 102 lb 6.4 oz (46.448 kg), SpO2 91 %. Body mass index is 18.14 kg/(m^2). General: Well developed, well nourished, in no acute distress. Head: Normocephalic, atraumatic, sclera non-icteric, no xanthomas, nares are without discharge. Neck: Negative for carotid bruits. JVP not elevated. Lungs: Bilateral crackles and right lower lobe rhonchi. Breathing is unlabored. Heart: RRR S1 S2. II.VI systolic murmur. No rubs, or gallops.  Abdomen: Soft, non-tender, non-distended with normoactive bowel sounds. No rebound/guarding. Extremities: No clubbing or cyanosis. No edema. Distal pedal pulses are 2+ and equal bilaterally. Neuro: Alert and oriented X 3. Moves all extremities spontaneously. Psych:   Responds to questions appropriately with a normal affect.   Intake/Output Summary (Last 24 hours) at 10/12/15 1012 Last data filed at 10/12/15 0518  Gross per 24 hour  Intake    630 ml  Output      0 ml  Net    630 ml    Inpatient Medications:  . aspirin  325 mg Oral Daily  . enoxaparin (LOVENOX) injection  40 mg Subcutaneous Q24H  . furosemide  20 mg Intravenous Q12H  . gabapentin  100 mg Oral TID  . [START ON 10/14/2015] levofloxacin  750 mg Oral Q48H  . levothyroxine  25 mcg Oral QAC breakfast  . metoprolol tartrate  12.5 mg Oral BID  . potassium chloride  20 mEq Oral BID  . ramipril  1.25 mg Oral Q12H   Infusions:    Labs:  Recent Labs  10/11/15 0541 10/12/15 0549  NA 133* 135  K 3.6 3.9  CL 101 103  CO2 25 26  GLUCOSE 136* 95  BUN 18 15  CREATININE 0.44 0.39*  CALCIUM 8.0* 7.9*    Recent Labs  10/11/15 0022  AST 41  ALT 40  ALKPHOS 78  BILITOT 0.4  PROT 6.5  ALBUMIN 3.1*    Recent Labs  10/11/15 0022 10/11/15 0541  WBC 5.0 4.3  HGB 12.6 13.1  HCT 37.4 38.8  MCV 97.1 97.5  PLT 293 218    Recent Labs  10/11/15 0022  TROPONINI <0.03   Invalid input(s): POCBNP No results for input(s): HGBA1C in the last 72 hours.   Weights: Filed Weights   10/10/15 2259 10/11/15 0419 10/12/15 0500  Weight: 100 lb (45.36 kg) 100 lb 12.8 oz (45.723 kg) 102 lb 6.4 oz (46.448 kg)     Radiology/Studies:  Dg Chest 2 View  10/11/2015  CLINICAL DATA:  Dyspnea tonight. Diagnosed with pneumonia several days ago. EXAM: CHEST  2 VIEW COMPARISON:  10/07/2015 FINDINGS: Linear and patchy opacities persist in both bases with mild worsening on the right. This is superimposed on severe hyperinflation. No large effusions are evident. There is unchanged moderate cardiomegaly. There is mild unchanged central vascular prominence. IMPRESSION: Mildly worsened basilar opacity superimposed on severe COPD. Suspicious for pneumonia. Electronically Signed   By: Andreas Newport M.D.    On: 10/11/2015 00:14   Dg Chest 2 View  10/07/2015  CLINICAL DATA:  Acute onset shortness of breath 2 days ago. 1 day history of generalized weakness. EXAM: CHEST  2 VIEW COMPARISON:  12/07/2013. FINDINGS: Cardiac silhouette markedly enlarged, unchanged. Thoracic aorta tortuous and atherosclerotic, unchanged. Markedly enlarged central pulmonary arteries, particularly on the right, unchanged. Linear scarring in both lung bases localizing to the lower lobes, right middle lobe and lingula. New patchy airspace opacities in the right lower lobe. Hyperinflation, emphysematous changes throughout both lungs, and mild central peribronchial thickening, unchanged. Small right pleural effusion. No left pleural effusion. No pneumothorax. Severe osseous demineralization. Thoracolumbar scoliosis convex left. IMPRESSION: 1. 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 Small right parapneumonic effusion. 2. Stable linear scarring in the lower lobes, right middle lobe and lingula. 3. Stable marked cardiomegaly without evidence pulmonary edema. 4. Stable markedly enlarged central pulmonary arteries, right larger than left. Electronically Signed   By: Evangeline Dakin M.D.   On: 10/07/2015 12:02     Assessment and Plan   1. Acute respiratory distress with hypoxia: -Likely multifactorial including right lower lobe pneumonia and acute on chronic diastolic CHF exacerbation -On O2 via nasal cannula currently, wean as able -Ambulate with PT -Must be walked without oxygen prior to discharge to assess for desaturations -With ambulation she dropped to 91% on room air  2. Acute on chronic diastolic CHF exacerbation: -Echo showed EF 60-65%, normal wall motion, GR1DD, moderate MR, LA moderately dilated mild to moderate TR, PASP 56, 6.1 cm pleural effusion, BNP 1528 -It appears acute on chronic diastolic CHF was playing a fair amount in her symptoms  -Would continue IV diuresis with Lasix 20 mg q 12 for at  least another 24 hours with KCl repletion  -Continue ramipril 1.25 mg q 12 hours -Hold starting beta blocker at this time given her conduction disease with PR interval of >400 msec (possibly in the setting of her underlying infection) -Limit fluids, IV fluids have been discontinued   3. Prolonged PR interval: -No longer on telemetry to assess if PR interval improved, worsened, or remained the same -Check ECG -Possibly in the setting of her underlying infection -Heart rates on telemetry are inaccurate as they are measuring the P waves as QRS complexes  -Perhaps she could benefit from EP follow up  4. Right lower lobe pneumonia: -On azithromycin and Levaquin per IM -Consider changing albuterol to Xopenex given her above conduction disease -Wean oxygen as able  -5. History of mitral regurgitation: -Echo as above -Previously felt to be medical candidate only given high risk of intervention should this be required    Melvern Banker, PA-C Pager: [phone] 10/12/2015, 10:12 AM

## 2015-10-12 NOTE — Progress Notes (Signed)
Pt was not discharged today.  Cardiology wanted to keep her another day to give lasix to help with pleural effusion.

## 2015-10-12 NOTE — Plan of Care (Signed)
Problem: Phase II Progression Outcomes Goal: Wean O2 if indicated Outcome: Completed/Met Date Met:  10/12/15 Patient was admitted with Spo2 2L. Patient is now on room air

## 2015-10-12 NOTE — Discharge Instructions (Signed)
Heart Failure Clinic appointment on November 02, 2015 at 10:00am with Darylene Price, [place]. Please call [phone] to reschedule.

## 2015-10-12 NOTE — Progress Notes (Signed)
Initial appointment made at the [facility] on November 02, 2015 at 10:00am. Thank you.

## 2015-10-12 NOTE — Telephone Encounter (Signed)
-----   Message from Arie Sabina sent at 10/12/2015 10:11 AM EDT ----- Regarding: tcm/ph R. Dunn   10/19/15 @ 2:00

## 2015-10-12 NOTE — Progress Notes (Signed)
Pt is requesting something for [place].  She has not had a BM since 10/31.  maalox and miralax ordered

## 2015-10-12 NOTE — Care Management Note (Signed)
Case Management Note  Patient Details  Name: Jennifer Skinner MRN: [id number] Date of Birth: 07-Dec-1931  Subjective/Objective:                  Met with patient and her husband to discuss discharge planning. She states she is independent with daily activities. I noticed this room smelled like cigarette smoke which may have been on her husband's clothes. She uses [facility] for Rx. She does not have DME and denies needs for DME. Her PCP is Dr. Luan Pulling in [place], [place]. She is not requiring O2. She denies RNCM needs.  Action/Plan:     Expected Discharge Date:                  Expected Discharge Plan:     In-House Referral:     Discharge planning Services  CM Consult  Post Acute Care Choice:    Choice offered to:  Patient, Spouse  DME Arranged:  N/A DME Agency:     HH Arranged:    [place] Agency:     Status of Service:  Completed, signed off  Medicare Important Message Given:    Date Medicare IM Given:    Medicare IM give by:    Date Additional Medicare IM Given:    Additional Medicare Important Message give by:     If discussed at [place] of Stay Meetings, dates discussed:    Additional Comments:  Marshell Garfinkel, RN 10/12/2015, 10:54 AM

## 2015-10-13 LAB — BASIC METABOLIC PANEL
ANION GAP: 6 (ref 5–15)
BUN: 15 mg/dL (ref 6–20)
CALCIUM: 8 mg/dL — AB (ref 8.9–10.3)
CO2: 26 mmol/L (ref 22–32)
CREATININE: 0.36 mg/dL — AB (ref 0.44–1.00)
Chloride: 102 mmol/L (ref 101–111)
GFR calc Af Amer: >60 mL/min (ref >60)
Glucose, Bld: 89 mg/dL (ref 65–99)
Potassium: 4 mmol/L (ref 3.5–5.1)
Sodium: 134 mmol/L — ABNORMAL LOW (ref 135–145)

## 2015-10-13 MED ORDER — POLYETHYLENE GLYCOL 3350 17 G PO PACK: 17.0000 g | PACK | Freq: Every day | ORAL | Status: DC

## 2015-10-13 NOTE — Discharge Summary (Signed)
Jennifer Skinner, is a 81 y.o. female  DOB 03-07-1931  MRN [id number].  Admission date:  10/10/2015  Admitting Physician  Demetrios Loll, MD  Discharge Date:  10/13/2015   Primary MD  [name], MD  Recommendations for primary care physician for things to follow:   Follow-up with Dr. Esmond Plants in  a week.   Admission Diagnosis  Right lower lobe pneumonia [J18.9] Acute congestive heart failure, unspecified congestive heart failure type ([place]) [I50.9]   Discharge Diagnosis  Right lower lobe pneumonia [J18.9] Acute congestive heart failure, unspecified congestive heart failure type ([place]) [I50.9]    Principal Problem:   Pneumonia Active Problems:   Acute CHF (congestive heart failure) (HCC)      Past Medical History  Diagnosis Date  . Mitral prolapse   . Mitral regurgitation   . Gastro-esophageal reflux   . Pericardial effusion   . Osteoporosis   . Arthritis   . Heart disease     Past Surgical History  Procedure Laterality Date  . Dilation and curettage of uterus    . Right eye       surgery  . Hysterotomy    . Right breast biopsy    . Tonsillectomy         History of present illness and  Hospital Course:     Kindly see H&P for history of present illness and admission details, please review complete Labs, Consult reports and Test reports for all details in brief  HPI  from the history and physical done on the day of admission 81 year old the female patient comes in because of shortness of breath, pedal edema, cough and sputum production for 3 days. Admitted for pneumonia, acute on chronic diastolic heart failure.   Hospital Course  . Acute hypoxic respiratory failure secondary to CHF, right lower lobe pneumonia:; Patient started on IV Lasix, is in. Patient BNP 1528 on admission. EKG showed normal  sinus rhythm with prolonged PR interval than 400 ms. Patient to seen by cardiology and they recommended continuing IV Lasix 20 mg for 2 days, . He is better today, change IV Lasix to by mouth. Started on ramipril at a lower dose. Patient can see Dr. Esmond Plants In the office. Her echocardiogram showed a 60% and some pleural effusion, mitral regurgitation.    2,community-acquired pneumonia: Improved with Levaquin, and prescribed Levaquin at discharge.  3.GERD;PPI Discharge Condition: stable   Follow UP  Follow-up Information    Follow up with [name], MD On 10/17/2015.   Specialty:  Family Medicine   Why:  at 1pm   Contact information:   [place] [address] [phone]       Follow up with Ida Rogue, MD On 10/19/2015.   Specialty:  Cardiology   Why:  at 2pm with [place] information:   [place] [address] [phone]       Follow up with Alisa Graff, FNP. Go on 11/02/2015.   Specialty:  Family Medicine   Why:  at 10:00am , to the Heart Failure Clinic   Contact information:   [place] [address] [address] [phone]         Discharge Instructions  and  Discharge Medications     Discharge Instructions    AMB referral to CHF clinic    Complete by:  As directed  Medication List    STOP taking these medications        doxycycline 100 MG capsule  Commonly known as:  VIBRAMYCIN      TAKE these medications        albuterol (2.5 MG/3ML) 0.083% nebulizer solution  Commonly known as:  PROVENTIL  Take 3 mLs (2.5 mg total) by nebulization every 6 (six) hours as needed for wheezing or shortness of breath.     APPLE CIDER VINEGAR ULTRA 600 MG Caps  Generic drug:  Apple Cider Vinegar  2 teaspoons once a day     aspirin 325 MG tablet  Take 325 mg by mouth daily.     furosemide 20 MG tablet  Commonly known as:  LASIX  Take 1 tablet (20 mg total) by mouth  2 (two) times daily.     gabapentin 100 MG capsule  Commonly known as:  NEURONTIN  Take 1 capsule (100 mg total) by mouth 3 (three) times daily.     levofloxacin 750 MG tablet  Commonly known as:  LEVAQUIN  Take 1 tablet (750 mg total) by mouth every other day.  Start taking on:  10/14/2015     levothyroxine 25 MCG tablet  Commonly known as:  SYNTHROID, LEVOTHROID  Take 1 tablet (25 mcg total) by mouth daily before breakfast. Ran out of meds.     metoprolol tartrate 25 MG tablet  Commonly known as:  LOPRESSOR  Take 0.5 tablets (12.5 mg total) by mouth 2 (two) times daily.     MULTI-VITAMIN DAILY PO  Take by mouth daily.     [place] calcium 500 MG Tabs tablet  Take 500 mg of elemental calcium by mouth 2 (two) times daily.     polyethylene glycol packet  Commonly known as:  MIRALAX / GLYCOLAX  Take 17 g by mouth daily.     potassium chloride SA 20 MEQ tablet  Commonly known as:  K-DUR,KLOR-CON  Take 1 tablet (20 mEq total) by mouth 2 (two) times daily.     ramipril 1.25 MG capsule  Commonly known as:  ALTACE  Take 1 capsule (1.25 mg total) by mouth every 12 (twelve) hours.     traMADol 50 MG tablet  Commonly known as:  ULTRAM  Take 1 tablet by mouth 2 (two) times daily.          Diet and Activity recommendation: See Discharge Instructions above   Consults obtained - cardio   Major procedures and Radiology Reports - PLEASE review detailed and final reports for all details, in brief -      Dg Chest 2 View  10/11/2015  CLINICAL DATA:  Dyspnea tonight. Diagnosed with pneumonia several days ago. EXAM: CHEST  2 VIEW COMPARISON:  10/07/2015 FINDINGS: Linear and patchy opacities persist in both bases with mild worsening on the right. This is superimposed on severe hyperinflation. No large effusions are evident. There is unchanged moderate cardiomegaly. There is mild unchanged central vascular prominence. IMPRESSION: Mildly worsened basilar opacity superimposed on severe  COPD. Suspicious for pneumonia. Electronically Signed   By: Andreas Newport M.D.   On: 10/11/2015 00:14   Dg Chest 2 View  10/07/2015  CLINICAL DATA:  Acute onset shortness of breath 2 days ago. 1 day history of generalized weakness. EXAM: CHEST  2 VIEW COMPARISON:  12/07/2013. FINDINGS: Cardiac silhouette markedly enlarged, unchanged. Thoracic aorta tortuous and atherosclerotic, unchanged. Markedly enlarged central pulmonary arteries, particularly on the right, unchanged. Linear scarring in both lung bases localizing to the  lower lobes, right middle lobe and lingula. New patchy airspace opacities in the right lower lobe. Hyperinflation, emphysematous changes throughout both lungs, and mild central peribronchial thickening, unchanged. Small right pleural effusion. No left pleural effusion. No pneumothorax. Severe osseous demineralization. Thoracolumbar scoliosis convex left. IMPRESSION: 1. 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 Small right parapneumonic effusion. 2. Stable linear scarring in the lower lobes, right middle lobe and lingula. 3. Stable marked cardiomegaly without evidence pulmonary edema. 4. Stable markedly enlarged central pulmonary arteries, right larger than left. Electronically Signed   By: Evangeline Dakin M.D.   On: 10/07/2015 12:02    Micro Results     Recent Results (from the past 240 hour(s))  Urine culture     Status: None   Collection Time: 10/07/15 11:06 AM  Result Value Ref Range Status   Specimen Description URINE, CLEAN CATCH  Final   Special Requests Normal  Final   Culture >=100,000 COLONIES/mL ENTEROCOCCUS FAECALIS  Final   Report Status 10/10/2015 FINAL  Final   Organism ID, Bacteria ENTEROCOCCUS FAECALIS  Final      Susceptibility   Enterococcus faecalis - MIC*    AMPICILLIN <=2 SENSITIVE Sensitive     NITROFURANTOIN <=16 SENSITIVE Sensitive     LINEZOLID 2 SENSITIVE Sensitive     CIPROFLOXACIN Value in next row Sensitive       SENSITIVE1    LEVOFLOXACIN Value in next row Sensitive      SENSITIVE2    TETRACYCLINE Value in next row Resistant      RESISTANT>=16    * >=100,000 COLONIES/mL ENTEROCOCCUS FAECALIS  Culture, blood (routine x 2)     Status: None (Preliminary result)   Collection Time: 10/11/15  2:47 AM  Result Value Ref Range Status   Specimen Description BLOOD RIGHT ANTECUBITAL  Final   Special Requests BOTTLES DRAWN AEROBIC AND ANAEROBIC 5CC  Final   Culture NO GROWTH 2 DAYS  Final   Report Status PENDING  Incomplete  Culture, blood (routine x 2)     Status: None (Preliminary result)   Collection Time: 10/11/15  2:47 AM  Result Value Ref Range Status   Specimen Description BLOOD RIGHT ARM  Final   Special Requests BOTTLES DRAWN AEROBIC AND ANAEROBIC 5CC  Final   Culture NO GROWTH 2 DAYS  Final   Report Status PENDING  Incomplete       Today   Subjective:   Chantea Surace today has no headache,no chest abdominal pain,no new weakness tingling or numbness, feels much better wants to go home today.   Objective:   Blood pressure 111/53, pulse 72, temperature 98.2 F (36.8 C), temperature source Oral, resp. rate 14, height 5\' 3"  (1.6 m), weight 46.448 kg (102 lb 6.4 oz), SpO2 97 %.   Intake/Output Summary (Last 24 hours) at 10/13/15 1320 Last data filed at 10/13/15 0950  Gross per 24 hour  Intake    240 ml  Output      0 ml  Net    240 ml    Exam Awake Alert, Oriented x 3, No new F.N deficits, Normal affect [place].AT,PERRAL Supple Neck,No JVD, No cervical lymphadenopathy appriciated.  Symmetrical Chest wall movement, Good air movement bilaterally, CTAB RRR,No Gallops,Rubs or new Murmurs, No Parasternal Heave +ve B.Sounds, Abd Soft, Non tender, No organomegaly appriciated, No rebound -guarding or rigidity. No Cyanosis, Clubbing or edema, No new Rash or bruise  Data Review   CBC w Diff: Lab Results  Component Value Date   WBC  4.3 10/11/2015   WBC 6.3 09/12/2013   HGB 13.1 10/11/2015    HGB 13.5 09/12/2013   HCT 38.8 10/11/2015   HCT 38.5 09/12/2013   PLT 218 10/11/2015   PLT 243 09/12/2013   LYMPHOPCT 10 10/07/2015   LYMPHOPCT 21.0 09/12/2013   MONOPCT 10 10/07/2015   MONOPCT 15.2 09/12/2013   EOSPCT 0 10/07/2015   EOSPCT 1.5 09/12/2013   BASOPCT 0 10/07/2015   BASOPCT 0.8 09/12/2013    CMP: Lab Results  Component Value Date   NA 134* 10/13/2015   NA 138 09/12/2013   K 4.0 10/13/2015   K 4.0 09/12/2013   CL 102 10/13/2015   CL 104 09/12/2013   CO2 26 10/13/2015   CO2 27 09/12/2013   BUN 15 10/13/2015   BUN 15 09/12/2013   CREATININE 0.36* 10/13/2015   CREATININE 0.53* 09/12/2013   PROT 6.5 10/11/2015   ALBUMIN 3.1* 10/11/2015   BILITOT 0.4 10/11/2015   ALKPHOS 78 10/11/2015   AST 41 10/11/2015   ALT 40 10/11/2015  .   Total Time in preparing paper work, data evaluation and todays exam - 50 minutes  Donaven Criswell M.D on 10/13/2015 at 1:20 PM    Note: This dictation was prepared with Dragon dictation along with smaller phrase technology. Any transcriptional errors that result from this process are unintentional.

## 2015-10-13 NOTE — Care Management Important Message (Signed)
Important Message  Patient Details  Name: Jennifer Skinner MRN: [id number] Date of Birth: 09/19/1931   Medicare Important Message Given:  Yes-second notification given    Marshell Garfinkel, RN 10/13/2015, 8:53 AM

## 2015-10-13 NOTE — Progress Notes (Signed)
All D/C forms completed and given to pt. Pt and husband verbalizes understanding of d/c instructions. IV d/C, personal belongings returned to pt. Explained appointments and where to go and pickup RX. `

## 2015-10-14 ENCOUNTER — Telehealth: Payer: Self-pay | Admitting: Cardiology

## 2015-10-14 NOTE — Telephone Encounter (Signed)
Pt was recently in hospital. Questions related to post-hospital f/u answered, pt advised to call for any new concerns.

## 2015-10-14 NOTE — Telephone Encounter (Signed)
Pt called in wanting to speak with the nurse. She would not voice what her concerns were in regards to, she just wanted to speak with the nurse. Please f/u with pt  Thanks

## 2015-10-16 LAB — CULTURE, BLOOD (ROUTINE X 2)
CULTURE: NO GROWTH
Culture: NO GROWTH

## 2015-10-17 ENCOUNTER — Encounter: Payer: Self-pay | Admitting: Family Medicine

## 2015-10-17 ENCOUNTER — Ambulatory Visit (INDEPENDENT_AMBULATORY_CARE_PROVIDER_SITE_OTHER): Payer: PPO | Admitting: Family Medicine

## 2015-10-17 ENCOUNTER — Ambulatory Visit: Payer: BC Managed Care – PPO | Admitting: Cardiology

## 2015-10-17 VITALS — BP 112/58 | HR 69 | Temp 98.1°F | Resp 16 | Ht <= 58 in | Wt 93.0 lb

## 2015-10-17 DIAGNOSIS — I5021 Acute systolic (congestive) heart failure: Secondary | ICD-10-CM | POA: Diagnosis not present

## 2015-10-17 DIAGNOSIS — J189 Pneumonia, unspecified organism: Secondary | ICD-10-CM | POA: Diagnosis not present

## 2015-10-17 DIAGNOSIS — J181 Lobar pneumonia, unspecified organism: Principal | ICD-10-CM

## 2015-10-17 DIAGNOSIS — J849 Interstitial pulmonary disease, unspecified: Secondary | ICD-10-CM | POA: Insufficient documentation

## 2015-10-17 NOTE — Progress Notes (Signed)
Name: [facility]   MRN: [id number]    DOB: 05-27-1931   Date:10/17/2015       Progress Note  Subjective  Chief Complaint  Chief Complaint  Patient presents with  . Pneumonia    follow up 10/10/2015 [facility] ADMIT  3 DAYS    HPI  Here for f/u of hospitalization for RLL pneumonia and CHF..  To see Dr. Rockey Situ in 2 days.  Breathing is doing a lot better.  O2 95%.  Finishing Levoquin No problem-specific assessment & plan notes found for this encounter.   Past Medical History  Diagnosis Date  . Mitral prolapse   . Mitral regurgitation   . Gastro-esophageal reflux   . Pericardial effusion   . Osteoporosis   . Arthritis   . Heart disease     Social History  Substance Use Topics  . Smoking status: Never Smoker   . Smokeless tobacco: Never Used  . Alcohol Use: No     Current outpatient prescriptions:  .  albuterol (PROVENTIL) (2.5 MG/3ML) 0.083% nebulizer solution, Take 3 mLs (2.5 mg total) by nebulization every 6 (six) hours as needed for wheezing or shortness of breath., Disp: 75 mL, Rfl: 12 .  Apple Cider Vinegar (APPLE CIDER VINEGAR ULTRA) 600 MG CAPS, 2 teaspoons once a day, Disp: , Rfl:  .  aspirin 325 MG tablet, Take 325 mg by mouth daily., Disp: , Rfl:  .  furosemide (LASIX) 20 MG tablet, Take 1 tablet (20 mg total) by mouth 2 (two) times daily., Disp: 30 tablet, Rfl: 0 .  gabapentin (NEURONTIN) 100 MG capsule, Take 1 capsule (100 mg total) by mouth 3 (three) times daily., Disp: 90 capsule, Rfl: 3 .  levofloxacin (LEVAQUIN) 750 MG tablet, Take 1 tablet (750 mg total) by mouth every other day., Disp: 5 tablet, Rfl: 0 .  levothyroxine (SYNTHROID, LEVOTHROID) 25 MCG tablet, Take 1 tablet (25 mcg total) by mouth daily before breakfast. Ran out of meds., Disp: 30 tablet, Rfl: 6 .  metoprolol tartrate (LOPRESSOR) 25 MG tablet, Take 0.5 tablets (12.5 mg total) by mouth 2 (two) times daily., Disp: 90 tablet, Rfl: 3 .  Multiple Vitamin (MULTI-VITAMIN DAILY PO), Take by mouth  daily., Disp: , Rfl:  .  Oyster Shell (OYSTER CALCIUM) 500 MG TABS, Take 500 mg of elemental calcium by mouth 2 (two) times daily., Disp: , Rfl:  .  polyethylene glycol powder (GLYCOLAX/MIRALAX) powder, , Disp: , Rfl:  .  potassium chloride SA (K-DUR,KLOR-CON) 20 MEQ tablet, Take 1 tablet (20 mEq total) by mouth 2 (two) times daily., Disp: 10 tablet, Rfl: 0 .  ramipril (ALTACE) 1.25 MG capsule, Take 1 capsule (1.25 mg total) by mouth every 12 (twelve) hours., Disp: 30 capsule, Rfl: 0 .  traMADol (ULTRAM) 50 MG tablet, Take 1 tablet by mouth 2 (two) times daily., Disp: , Rfl:   Allergies  Allergen Reactions  . Penicillins     Review of Systems  Constitutional: Positive for malaise/fatigue. Negative for fever and chills.  Respiratory: Negative for cough, sputum production, shortness of breath and wheezing.   Cardiovascular: Positive for leg swelling (much better). Negative for chest pain.  Gastrointestinal: Negative for heartburn, abdominal pain and blood in stool.  Genitourinary: Negative for dysuria, urgency and frequency.  Neurological: Positive for weakness.      Objective  Filed Vitals:   10/17/15 1308  BP: 112/58  Pulse: 69  Temp: 98.1 F (36.7 C)  Resp: 16  Height: $Remove'4\' 10"'KqWgowO$  (1.473 m)  Weight:  93 lb (42.185 kg)  SpO2: 95%     Physical Exam  Constitutional: She is oriented to person, place, and time and well-developed, well-nourished, and in no distress. No distress.  HENT:  Head: Normocephalic and atraumatic.  Eyes: Conjunctivae and EOM are normal. Pupils are equal, round, and reactive to light. No scleral icterus.  Neck: Normal range of motion. Neck supple. No thyromegaly present.  Cardiovascular: Normal rate and regular rhythm.  Exam reveals no gallop and no friction rub.   Murmur heard.  Systolic murmur is present with a grade of 3/6  apex  Pulmonary/Chest: Effort normal and breath sounds normal. No respiratory distress. She has no wheezes. She has no rales.   Musculoskeletal: She exhibits edema (traced bilateral pedal edema.).  Lymphadenopathy:    She has no cervical adenopathy.  Neurological: She is alert and oriented to person, place, and time.  Vitals reviewed.     Recent Results (from the past 2160 hour(s))  Urinalysis complete, with microscopic     Status: Abnormal   Collection Time: 10/07/15 11:06 AM  Result Value Ref Range   Color, Urine YELLOW YELLOW   APPearance CLEAR CLEAR   Glucose, UA NEGATIVE NEGATIVE mg/dL   Bilirubin Urine NEGATIVE NEGATIVE   Ketones, ur NEGATIVE NEGATIVE mg/dL   Specific Gravity, Urine 1.010 1.005 - 1.030   Hgb urine dipstick NEGATIVE NEGATIVE   pH 6.0 5.0 - 8.0   Protein, ur NEGATIVE NEGATIVE mg/dL   Nitrite NEGATIVE NEGATIVE   Leukocytes, UA NEGATIVE NEGATIVE   RBC / HPF NONE SEEN <3 RBC/hpf   WBC, UA 0-5 <3 WBC/hpf   Bacteria, UA MANY (A) RARE   Squamous Epithelial / LPF 0-5 (A) RARE   Hyaline Casts, UA PRESENT   Urine culture     Status: None   Collection Time: 10/07/15 11:06 AM  Result Value Ref Range   Specimen Description URINE, CLEAN CATCH    Special Requests Normal    Culture >=100,000 COLONIES/mL ENTEROCOCCUS FAECALIS    Report Status 10/10/2015 FINAL    Organism ID, Bacteria ENTEROCOCCUS FAECALIS       Susceptibility   Enterococcus faecalis - MIC*    AMPICILLIN <=2 SENSITIVE Sensitive     NITROFURANTOIN <=16 SENSITIVE Sensitive     LINEZOLID 2 SENSITIVE Sensitive     CIPROFLOXACIN Value in next row Sensitive      SENSITIVE1    LEVOFLOXACIN Value in next row Sensitive      SENSITIVE2    TETRACYCLINE Value in next row Resistant      RESISTANT>=16    * >=100,000 COLONIES/mL ENTEROCOCCUS FAECALIS  CBC with Differential     Status: Abnormal   Collection Time: 10/07/15 11:06 AM  Result Value Ref Range   WBC 6.3 3.6 - 11.0 K/uL   RBC 4.08 3.80 - 5.20 MIL/uL   Hemoglobin 13.4 12.0 - 16.0 g/dL   HCT 39.2 35.0 - 47.0 %   MCV 96.1 80.0 - 100.0 fL   MCH 32.8 26.0 - 34.0 pg   MCHC  34.1 32.0 - 36.0 g/dL   RDW 13.2 11.5 - 14.5 %   Platelets 259 150 - 440 K/uL   Neutrophils Relative % 80 %   Neutro Abs 5.0 1.4 - 6.5 K/uL   Lymphocytes Relative 10 %   Lymphs Abs 0.7 (L) 1.0 - 3.6 K/uL   Monocytes Relative 10 %   Monocytes Absolute 0.7 0.2 - 0.9 K/uL   Eosinophils Relative 0 %   Eosinophils Absolute 0.0 0 -  0.7 K/uL   Basophils Relative 0 %   Basophils Absolute 0.0 0 - 0.1 K/uL  Comprehensive metabolic panel     Status: Abnormal   Collection Time: 10/07/15 11:06 AM  Result Value Ref Range   Sodium 130 (L) 135 - 145 mmol/L   Potassium 4.2 3.5 - 5.1 mmol/L   Chloride 91 (L) 101 - 111 mmol/L   CO2 29 22 - 32 mmol/L   Glucose, Bld 88 65 - 99 mg/dL   BUN 16 6 - 20 mg/dL   Creatinine, Ser 0.45 0.44 - 1.00 mg/dL   Calcium 8.9 8.9 - 10.3 mg/dL   Total Protein 6.8 6.5 - 8.1 g/dL   Albumin 3.4 (L) 3.5 - 5.0 g/dL   AST 38 15 - 41 U/L   ALT 32 14 - 54 U/L   Alkaline Phosphatase 76 38 - 126 U/L   Total Bilirubin 0.4 0.3 - 1.2 mg/dL   GFR calc non Af Amer >60 >60 mL/min   GFR calc Af Amer >60 >60 mL/min    Comment: (NOTE) The eGFR has been calculated using the CKD EPI equation. This calculation has not been validated in all clinical situations. eGFR's persistently <60 mL/min signify possible Chronic Kidney Disease.    Anion gap 10 5 - 15  CBC     Status: None   Collection Time: 10/11/15 12:22 AM  Result Value Ref Range   WBC 5.0 3.6 - 11.0 K/uL   RBC 3.85 3.80 - 5.20 MIL/uL   Hemoglobin 12.6 12.0 - 16.0 g/dL   HCT 37.4 35.0 - 47.0 %   MCV 97.1 80.0 - 100.0 fL   MCH 32.7 26.0 - 34.0 pg   MCHC 33.7 32.0 - 36.0 g/dL   RDW 13.2 11.5 - 14.5 %   Platelets 293 150 - 440 K/uL  Comprehensive metabolic panel     Status: Abnormal   Collection Time: 10/11/15 12:22 AM  Result Value Ref Range   Sodium 133 (L) 135 - 145 mmol/L   Potassium 4.1 3.5 - 5.1 mmol/L   Chloride 101 101 - 111 mmol/L   CO2 27 22 - 32 mmol/L   Glucose, Bld 106 (H) 65 - 99 mg/dL   BUN 23 (H) 6 -  20 mg/dL   Creatinine, Ser 0.62 0.44 - 1.00 mg/dL   Calcium 8.4 (L) 8.9 - 10.3 mg/dL   Total Protein 6.5 6.5 - 8.1 g/dL   Albumin 3.1 (L) 3.5 - 5.0 g/dL   AST 41 15 - 41 U/L   ALT 40 14 - 54 U/L   Alkaline Phosphatase 78 38 - 126 U/L   Total Bilirubin 0.4 0.3 - 1.2 mg/dL   GFR calc non Af Amer >60 >60 mL/min   GFR calc Af Amer >60 >60 mL/min    Comment: (NOTE) The eGFR has been calculated using the CKD EPI equation. This calculation has not been validated in all clinical situations. eGFR's persistently <60 mL/min signify possible Chronic Kidney Disease.    Anion gap 5 5 - 15  Troponin I     Status: None   Collection Time: 10/11/15 12:22 AM  Result Value Ref Range   Troponin I <0.03 <0.031 ng/mL    Comment:        NO INDICATION OF MYOCARDIAL INJURY.   Brain natriuretic peptide     Status: Abnormal   Collection Time: 10/11/15 12:22 AM  Result Value Ref Range   B Natriuretic Peptide 1528.0 (H) 0.0 - 100.0 pg/mL  Culture,  blood (routine x 2)     Status: None   Collection Time: 10/11/15  2:47 AM  Result Value Ref Range   Specimen Description BLOOD RIGHT ANTECUBITAL    Special Requests BOTTLES DRAWN AEROBIC AND ANAEROBIC 5CC    Culture NO GROWTH 5 DAYS    Report Status 10/16/2015 FINAL   Culture, blood (routine x 2)     Status: None   Collection Time: 10/11/15  2:47 AM  Result Value Ref Range   Specimen Description BLOOD RIGHT ARM    Special Requests BOTTLES DRAWN AEROBIC AND ANAEROBIC 5CC    Culture NO GROWTH 5 DAYS    Report Status 10/16/2015 FINAL   CBC     Status: None   Collection Time: 10/11/15  5:41 AM  Result Value Ref Range   WBC 4.3 3.6 - 11.0 K/uL   RBC 3.98 3.80 - 5.20 MIL/uL   Hemoglobin 13.1 12.0 - 16.0 g/dL   HCT 38.8 35.0 - 47.0 %   MCV 97.5 80.0 - 100.0 fL   MCH 32.8 26.0 - 34.0 pg   MCHC 33.7 32.0 - 36.0 g/dL   RDW 13.8 11.5 - 14.5 %   Platelets 218 150 - 440 K/uL  Basic metabolic panel     Status: Abnormal   Collection Time: 10/11/15  5:41 AM   Result Value Ref Range   Sodium 133 (L) 135 - 145 mmol/L   Potassium 3.6 3.5 - 5.1 mmol/L   Chloride 101 101 - 111 mmol/L   CO2 25 22 - 32 mmol/L   Glucose, Bld 136 (H) 65 - 99 mg/dL   BUN 18 6 - 20 mg/dL   Creatinine, Ser 0.44 0.44 - 1.00 mg/dL   Calcium 8.0 (L) 8.9 - 10.3 mg/dL   GFR calc non Af Amer >60 >60 mL/min   GFR calc Af Amer >60 >60 mL/min    Comment: (NOTE) The eGFR has been calculated using the CKD EPI equation. This calculation has not been validated in all clinical situations. eGFR's persistently <60 mL/min signify possible Chronic Kidney Disease.    Anion gap 7 5 - 15  Basic metabolic panel     Status: Abnormal   Collection Time: 10/12/15  5:49 AM  Result Value Ref Range   Sodium 135 135 - 145 mmol/L   Potassium 3.9 3.5 - 5.1 mmol/L   Chloride 103 101 - 111 mmol/L   CO2 26 22 - 32 mmol/L   Glucose, Bld 95 65 - 99 mg/dL   BUN 15 6 - 20 mg/dL   Creatinine, Ser 0.39 (L) 0.44 - 1.00 mg/dL   Calcium 7.9 (L) 8.9 - 10.3 mg/dL   GFR calc non Af Amer >60 >60 mL/min   GFR calc Af Amer >60 >60 mL/min    Comment: (NOTE) The eGFR has been calculated using the CKD EPI equation. This calculation has not been validated in all clinical situations. eGFR's persistently <60 mL/min signify possible Chronic Kidney Disease.    Anion gap 6 5 - 15  Basic metabolic panel     Status: Abnormal   Collection Time: 10/13/15  4:27 AM  Result Value Ref Range   Sodium 134 (L) 135 - 145 mmol/L   Potassium 4.0 3.5 - 5.1 mmol/L   Chloride 102 101 - 111 mmol/L   CO2 26 22 - 32 mmol/L   Glucose, Bld 89 65 - 99 mg/dL   BUN 15 6 - 20 mg/dL   Creatinine, Ser 0.36 (L) 0.44 - 1.00 mg/dL  Calcium 8.0 (L) 8.9 - 10.3 mg/dL   GFR calc non Af Amer >60 >60 mL/min   GFR calc Af Amer >60 >60 mL/min    Comment: (NOTE) The eGFR has been calculated using the CKD EPI equation. This calculation has not been validated in all clinical situations. eGFR's persistently <60 mL/min signify possible Chronic  Kidney Disease.    Anion gap 6 5 - 15     Assessment & Plan  1. Right lower lobe pneumonia -finish Levaquin  2. Acute systolic congestive heart failure (HCC) Cont. Lasix and potassium and see Dr. Rockey Situ in 2 days.

## 2015-10-17 NOTE — Patient Instructions (Addendum)
Keep appt. With Dr. Rockey Situ re: heart failure.  Plan CXR on return.

## 2015-10-18 ENCOUNTER — Ambulatory Visit: Payer: BC Managed Care – PPO | Admitting: Cardiology

## 2015-10-19 ENCOUNTER — Ambulatory Visit (INDEPENDENT_AMBULATORY_CARE_PROVIDER_SITE_OTHER): Payer: PPO | Admitting: Physician Assistant

## 2015-10-19 ENCOUNTER — Encounter: Payer: Self-pay | Admitting: Physician Assistant

## 2015-10-19 VITALS — BP 104/58 | HR 67 | Ht 59.0 in | Wt 89.8 lb

## 2015-10-19 DIAGNOSIS — I5032 Chronic diastolic (congestive) heart failure: Secondary | ICD-10-CM

## 2015-10-19 DIAGNOSIS — I059 Rheumatic mitral valve disease, unspecified: Secondary | ICD-10-CM

## 2015-10-19 DIAGNOSIS — I080 Rheumatic disorders of both mitral and aortic valves: Secondary | ICD-10-CM

## 2015-10-19 DIAGNOSIS — I10 Essential (primary) hypertension: Secondary | ICD-10-CM

## 2015-10-19 DIAGNOSIS — Z8701 Personal history of pneumonia (recurrent): Secondary | ICD-10-CM

## 2015-10-19 DIAGNOSIS — I440 Atrioventricular block, first degree: Secondary | ICD-10-CM

## 2015-10-19 DIAGNOSIS — J449 Chronic obstructive pulmonary disease, unspecified: Secondary | ICD-10-CM | POA: Diagnosis not present

## 2015-10-19 MED ORDER — METOPROLOL TARTRATE 25 MG PO TABS
12.5000 mg | ORAL_TABLET | Freq: Two times a day (BID) | ORAL | Status: DC
Start: 2015-10-19 — End: 2016-03-13

## 2015-10-19 MED ORDER — FUROSEMIDE 20 MG PO TABS: 20.0000 mg | ORAL_TABLET | Freq: Two times a day (BID) | ORAL | Status: DC

## 2015-10-19 MED ORDER — POTASSIUM CHLORIDE CRYS ER 20 MEQ PO TBCR: 20.0000 meq | EXTENDED_RELEASE_TABLET | Freq: Two times a day (BID) | ORAL | Status: DC

## 2015-10-19 NOTE — Progress Notes (Signed)
[facility] Follow Up Note:   Date of Encounter: 10/19/2015  ID: Jennifer [facility], DOB [birth year], MRN [id number]  PCP: [name], MD Primary Cardiologist: Dr. Rockey Situ, MD  Chief Complaint  Patient presents with  . other    Follow up from [facility]. Meds reviewed by the patient verbally. "doing well."      Pt. c/o nausea and dizziness.      HPI:  81 year old female with history of mitral valve prolapse and regurgitation, diastolic dysfunction, COPD, and GERD who presents for [facility] follow up after recent admission to [facility] from 10/31 to 11/3 for acute respiratory distress with hypoxia and COPD exacerbation in the setting of right lower lobe PNA and acute on chronic diastolic CHF.   She was last seen by cardiology as an outpatient on 08/31/2014 for routine follow up of her mitral valve prolapse and regurgitation. At that time she had no new symptoms and continued to perform her household chores, though had slowed down 2/2 back pain. It was felt that any kind of surgical intervention in the future would be of high risk at that time, thus she was continued on medical therapy. It was considered to repeat an echo at her next appointment.   She recently presented to outside urgent care 10/28 with intermittent weakness for months. 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 CBC unremarkable. She was diagnosed with a right lower lobe pneumonia at that time and started on Levaquin with outpatient follow up. She returned to the ED on 10/31 with increased SOB and leg swelling x 3 days. She initially felt the LEE was 2/2 Levaquin and the urgent care changed her to doxycycline over the phone. She was found to have a BNP of 1528 upon her arrival to [facility]. Echo showed normal LV systolic funation at 31-54%, normal wall motion, GR1DD, mitral valve prolasp with moderate regurgitation, mild to  moderate TR, PASP 56 mm Hg. Weight up 8 pounds over the past one month. ECG showed NSR, 67 bpm, prolonged PR interval of 405 msec, right axis deviation, multiform PVCs, low voltage in the extremity leads, CXR showed mildly worsened basilar opacity superimposed on severe COPD. Suspicious for pneumonia. She was started on azithromycin, Levaquin q 48 hours, and Lasix 20 q 12 hours. She required O2 to maintain her sats as her oxygen dropped to 91% on room air with ambulation. She diuresed well and was discharged on Lasix 20 mg bid for her HF along with Levaquin for her PNA.   She is doing much better since her discharge from [facility]. No further SOB. Feels more like herself, though not yet quite all the way back. No orthopnea, PND, early satiety, cough, chest pain, SOB, nausea, vomiting, or palpitations. She is advancing her activity as tolerated. Tolerating her medications without issues. She does drink approximately 8 large glasses of water, plus coffee x 2 daily. She will eat popcorn with salt every Sunday and also eats some canned vegetables. She has not noticed any LEE. She does not weight herself currently, though does have a scale at home.      Past Medical History  Diagnosis Date  . Mitral prolapse   . Mitral regurgitation   . Gastro-esophageal reflux   . Osteoporosis   . Arthritis   . Chronic diastolic CHF (congestive heart failure) ([place])     a. echo 2014: EF 55-60%, nl wall motion, GR1DD, mild MR,  moder mitral prolapse, PASP 40 mm Hg; b. echo 09/2015: EF 60-65%, nl wall motion, GR1DD, mod MR, mild to mod TR, PASP 56 mm Hg  . COPD (chronic obstructive pulmonary disease) ([facility])   . 1St degree AV block   : Past Surgical History  Procedure Laterality Date  . Dilation and curettage of uterus    . Right eye       surgery  . Hysterotomy    . Right breast biopsy    . Tonsillectomy    : Family History  Problem Relation Age of Onset  . Heart attack Father   . Throat cancer Sister   .  CAD Brother   :  reports that she has never smoked. She has never used smokeless tobacco. She reports that she does not drink alcohol or use illicit drugs.:   Allergies:  Allergies  Allergen Reactions  . Penicillins      Home Medications:  Current Outpatient Prescriptions  Medication Sig Dispense Refill  . albuterol (PROVENTIL) (2.5 MG/3ML) 0.083% nebulizer solution Take 3 mLs (2.5 mg total) by nebulization every 6 (six) hours as needed for wheezing or shortness of breath. 75 mL 12  . Apple Cider Vinegar (APPLE CIDER VINEGAR ULTRA) 600 MG CAPS 2 teaspoons once a day    . aspirin 325 MG tablet Take 325 mg by mouth daily.    . furosemide (LASIX) 20 MG tablet Take 1 tablet (20 mg total) by mouth 2 (two) times daily. 30 tablet 5  . gabapentin (NEURONTIN) 100 MG capsule Take 1 capsule (100 mg total) by mouth 3 (three) times daily. 90 capsule 3  . levofloxacin (LEVAQUIN) 750 MG tablet Take 1 tablet (750 mg total) by mouth every other day. 5 tablet 0  . levothyroxine (SYNTHROID, LEVOTHROID) 25 MCG tablet Take 1 tablet (25 mcg total) by mouth daily before breakfast. Ran out of meds. 30 tablet 6  . metoprolol tartrate (LOPRESSOR) 25 MG tablet Take 0.5 tablets (12.5 mg total) by mouth 2 (two) times daily. 60 tablet 5  . Multiple Vitamin (MULTI-VITAMIN DAILY PO) Take by mouth daily.    [name] (OYSTER CALCIUM) 500 MG TABS Take 500 mg of elemental calcium by mouth 2 (two) times daily.    . polyethylene glycol powder (GLYCOLAX/MIRALAX) powder     . potassium chloride SA (K-DUR,KLOR-CON) 20 MEQ tablet Take 1 tablet (20 mEq total) by mouth 2 (two) times daily. 60 tablet 5  . traMADol (ULTRAM) 50 MG tablet Take 1 tablet by mouth 2 (two) times daily.     No current facility-administered medications for this visit.     Review of Systems:  Review of Systems  Constitutional: Positive for malaise/fatigue. Negative for fever, chills, weight loss and diaphoresis.  HENT: Negative for  congestion.   Eyes: Negative for discharge and redness.  Respiratory: Negative for cough, hemoptysis, sputum production, shortness of breath and wheezing.   Cardiovascular: Negative for chest pain, palpitations, orthopnea, claudication, leg swelling and PND.  Gastrointestinal: Negative for nausea and vomiting.  Musculoskeletal: Negative for falls.  Skin: Negative for rash.  Neurological: Negative for dizziness, tingling, tremors, sensory change, speech change, focal weakness, loss of consciousness and weakness.  Endo/Heme/Allergies: Does not bruise/bleed easily.  Psychiatric/Behavioral: The patient is not nervous/anxious.      Physical Exam:  Blood pressure 104/58, pulse 67, height 4\' 11"  (1.499 m), weight 89 lb 12.8 oz (40.733 kg), SpO2 95 %. BMI: Body mass index is 18.13 kg/(m^2). General: Pleasant, NAD. Psych: Normal affect.  Responds to questions with normal affect.  Neuro: Alert and oriented X 3. Moves all extremities spontaneously. HEENT: Normocephalic, atraumatic. EOM intact bilaterally. Sclera anicteric.  Neck: Trachea midline. Supple without bruits or JVD. Lungs:  Respirations regular and unlabored, CTA bilaterally without wheezing, crackles, or rhonchi.  Heart: RRR, normal s3, s4. No murmurs, rubs, or gallops.  Abdomen: Soft, non-tender, non-distended, BS + x 4.  Extremities: No clubbing, cyanosis or edema. DP/PT/Radials 2+ and equal bilaterally.   Accessory Clinical Findings:  EKG: NSR with 1st degree AV block with occasional PVCs, 67 bpm, right axis deviation, no significant st/t changes   Recent Labs: 10/11/2015: ALT 40; B Natriuretic Peptide 1528.0*; Hemoglobin 13.1; Platelets 218 10/13/2015: BUN 15; Creatinine, Ser 0.36*; Potassium 4.0; Sodium 134*  No results found for: CHOL, TRIG, HDL, CHOLHDL, VLDL, LDLCALC, LDLDIRECT  Weights: Wt Readings from Last 3 Encounters:  10/19/15 89 lb 12.8 oz (40.733 kg)  10/17/15 93 lb (42.185 kg)  10/12/15 102 lb 6.4 oz (46.448 kg)      Estimated Creatinine Clearance: 34.8 mL/min (by C-G formula based on Cr of 0.36).   Other studies Reviewed: Additional studies/ records that were reviewed today include: prior office notes and [facility] admission.  Assessment & Plan:  1. Chronic diastolic CHF:  -Much improved -She appears euvolemic today -Continue Lasix 20 mg bid with KCl 20 meq bid -Check bmet -Long discussion regarding po fluid consumption and salt consumption -Advised patient to weigh herself daily, write down weights in a notebook. If she sees a weight gain of >3 pounds over a night or > 5 pounds in a week she is to call us for increased diuresis instructions. She and her husband understand and agree -Advance activity as tolerated   2. Right lower lobe PNA:  -Improved -Finish out Levaquin per IM/PCP -Nebs per PCP -Follow up with PCP as directed    3. Prolonged PR interval:  -Improved from admission -Follow  4. Mitral regurgitation:  -Stable -Medical management  5. HTN: -BP is slightly on the soft side -Stop ramipril to allow for more cushion in BP with diuresis  -Continue Lasix 20 mg bid and Lopressor 12.5 mg bid    Dispo: -Follow up 2 months with Dr. Rockey Situ, MD  Current medicines are reviewed at length with the patient today.  The patient did not have any concerns regarding medicines.   Christell Faith, PA-C [place], [place] [id number] [phone] [place] 10/19/2015, 4:31 PM

## 2015-10-19 NOTE — Patient Instructions (Signed)
Medication Instructions:  Your physician has recommended you make the following change in your medication:  STOP taking ramipril   Labwork: BMET  Testing/Procedures: none  Follow-Up: Your physician recommends that you schedule a follow-up appointment in: two months with Dr. Rockey Situ   Any Other Special Instructions Will Be Listed Below (If Applicable).     If you need a refill on your cardiac medications before your next appointment, please call your pharmacy.

## 2015-10-20 ENCOUNTER — Telehealth: Payer: Self-pay | Admitting: *Deleted

## 2015-10-20 ENCOUNTER — Encounter: Payer: Self-pay | Admitting: *Deleted

## 2015-10-20 ENCOUNTER — Telehealth: Payer: Self-pay

## 2015-10-20 LAB — BASIC METABOLIC PANEL
BUN / CREAT RATIO: 30 — AB (ref 11–26)
BUN: 13 mg/dL (ref 8–27)
CO2: 24 mmol/L (ref 18–29)
CREATININE: 0.43 mg/dL — AB (ref 0.57–1.00)
Calcium: 9.1 mg/dL (ref 8.7–10.3)
Chloride: 89 mmol/L — ABNORMAL LOW (ref 97–106)
GFR calc non Af Amer: 95 mL/min/{1.73_m2} (ref >59)
GFR, EST AFRICAN AMERICAN: 110 mL/min/{1.73_m2} (ref >59)
Glucose: 94 mg/dL (ref 65–99)
Potassium: 4.4 mmol/L (ref 3.5–5.2)
Sodium: 133 mmol/L — ABNORMAL LOW (ref 136–144)

## 2015-10-20 MED ORDER — LEVOFLOXACIN 750 MG PO TABS: 750.0000 mg | ORAL_TABLET | ORAL | Status: DC

## 2015-10-20 NOTE — Telephone Encounter (Signed)
Patient has been notified that she is having 2 more tabs sent to pharmacy. One tablet on Saturday and last tablet on Monday.

## 2015-10-20 NOTE — Telephone Encounter (Signed)
Spoke w/ pt.  She states that she does not think she has been taking her Levaquin as prescribed, thinks she missed a dose. Advised her to contact PCP.  She is agreeable and will call back if we can be of further assistance.

## 2015-10-20 NOTE — Telephone Encounter (Signed)
Pt has a question regarding an antibiotic she is taking. She states she has been taking 1 twice a day, states she is supposed to be taking them every other day. States this was prescibed when she was in the hospital. Please call.

## 2015-10-20 NOTE — Telephone Encounter (Signed)
She is to start every other day  by skipping today and taking 1 pill tomorrow.  Send in # 2 more Levaquins so she can take every other day for 2 more doses after tomorrow.

## 2015-10-20 NOTE — Telephone Encounter (Signed)
Patient just realized that she took Levaquin 750 incorrectly. It was prescribed to take 1 every other day #5. Patient took 1 twice daily and only has 1 tablet left. Please advise.

## 2015-10-25 NOTE — Telephone Encounter (Signed)
This encounter was created in error - please disregard.

## 2015-11-02 ENCOUNTER — Ambulatory Visit: Payer: PPO | Admitting: Family

## 2015-11-08 ENCOUNTER — Encounter: Payer: Self-pay | Admitting: Family Medicine

## 2015-11-08 ENCOUNTER — Ambulatory Visit
Admission: RE | Admit: 2015-11-08 | Discharge: 2015-11-08 | Disposition: A | Discharge status: Home / Self Care | Payer: PPO | Source: Ambulatory Visit | Attending: Family Medicine | Admitting: Family Medicine

## 2015-11-08 ENCOUNTER — Ambulatory Visit (INDEPENDENT_AMBULATORY_CARE_PROVIDER_SITE_OTHER): Payer: PPO | Admitting: Family Medicine

## 2015-11-08 VITALS — BP 116/53 | HR 70 | Resp 16 | Ht <= 58 in | Wt 90.2 lb

## 2015-11-08 DIAGNOSIS — I5032 Chronic diastolic (congestive) heart failure: Secondary | ICD-10-CM

## 2015-11-08 DIAGNOSIS — J449 Chronic obstructive pulmonary disease, unspecified: Secondary | ICD-10-CM | POA: Insufficient documentation

## 2015-11-08 DIAGNOSIS — J189 Pneumonia, unspecified organism: Secondary | ICD-10-CM | POA: Insufficient documentation

## 2015-11-08 NOTE — Progress Notes (Signed)
Name: Jennifer Skinner   MRN: [id number]    DOB: 01/07/1931   Date:11/08/2015       Progress Note  Subjective  Chief Complaint  Chief Complaint  Patient presents with  . Pneumonia    f/u feels better     HPI Here for f/u of pneumonia.  Feeling better.  Got CXR earlier.  Results pending.    No problem-specific assessment & plan notes found for this encounter.   Past Medical History  Diagnosis Date  . Mitral prolapse   . Mitral regurgitation   . Gastro-esophageal reflux   . Osteoporosis   . Arthritis   . Chronic diastolic CHF (congestive heart failure) ([place])     a. echo 2014: EF 55-60%, nl wall motion, GR1DD, mild MR, moder mitral prolapse, PASP 40 mm Hg; b. echo 09/2015: EF 60-65%, nl wall motion, GR1DD, mod MR, mild to mod TR, PASP 56 mm Hg  . COPD (chronic obstructive pulmonary disease) ([place])   . 1St degree AV block     Past Surgical History  Procedure Laterality Date  . Dilation and curettage of uterus    . Right eye       surgery  . Hysterotomy    . Right breast biopsy    . Tonsillectomy      Family History  Problem Relation Age of Onset  . Heart attack Father 16  . Throat cancer Sister   . CAD Brother   . CVA Mother 54    Social History   Social History  . Marital Status: Married    Spouse Name: N/A  . Number of Children: N/A  . Years of Education: N/A   Occupational History  . Not on file.   Social History Main Topics  . Smoking status: Never Smoker   . Smokeless tobacco: Never Used  . Alcohol Use: No  . Drug Use: No  . Sexual Activity: Not on file   Other Topics Concern  . Not on file   Social History Narrative     Current outpatient prescriptions:  .  albuterol (PROVENTIL) (2.5 MG/3ML) 0.083% nebulizer solution, Take 3 mLs (2.5 mg total) by nebulization every 6 (six) hours as needed for wheezing or shortness of breath., Disp: 75 mL, Rfl: 12 .  Apple Cider Vinegar (APPLE CIDER VINEGAR ULTRA) 600 MG CAPS, 2 teaspoons once a day, Disp: ,  Rfl:  .  aspirin 325 MG tablet, Take 325 mg by mouth daily., Disp: , Rfl:  .  furosemide (LASIX) 20 MG tablet, Take 1 tablet (20 mg total) by mouth 2 (two) times daily., Disp: 30 tablet, Rfl: 5 .  gabapentin (NEURONTIN) 100 MG capsule, Take 1 capsule (100 mg total) by mouth 3 (three) times daily., Disp: 90 capsule, Rfl: 3 .  levothyroxine (SYNTHROID, LEVOTHROID) 25 MCG tablet, Take 1 tablet (25 mcg total) by mouth daily before breakfast. Ran out of meds., Disp: 30 tablet, Rfl: 6 .  metoprolol tartrate (LOPRESSOR) 25 MG tablet, Take 0.5 tablets (12.5 mg total) by mouth 2 (two) times daily., Disp: 60 tablet, Rfl: 5 .  Multiple Vitamin (MULTI-VITAMIN DAILY PO), Take by mouth daily., Disp: , Rfl:  .  Oyster Shell (OYSTER CALCIUM) 500 MG TABS, Take 500 mg of elemental calcium by mouth 2 (two) times daily., Disp: , Rfl:  .  polyethylene glycol powder (GLYCOLAX/MIRALAX) powder, , Disp: , Rfl:  .  potassium chloride SA (K-DUR,KLOR-CON) 20 MEQ tablet, Take 1 tablet (20 mEq total) by mouth 2 (two) times  daily., Disp: 60 tablet, Rfl: 5 .  traMADol (ULTRAM) 50 MG tablet, Take 1 tablet by mouth 2 (two) times daily., Disp: , Rfl:   Allergies  Allergen Reactions  . Penicillins      Review of Systems  Constitutional: Negative for fever, chills, weight loss and malaise/fatigue.  Eyes: Negative for blurred vision and double vision.  Respiratory: Negative for cough, shortness of breath and wheezing.   Cardiovascular: Negative for chest pain, palpitations and leg swelling.  Gastrointestinal: Negative for heartburn, abdominal pain and blood in stool.  Genitourinary: Negative for dysuria, urgency and frequency.  Skin: Negative for rash.  Neurological: Negative for weakness and headaches.      Objective  Filed Vitals:   11/08/15 1043  BP: 116/53  Pulse: 70  Resp: 16  Height: '4\' 9"'  (1.448 m)  Weight: 90 lb 3.2 oz (40.914 kg)    Physical Exam  Constitutional: She is well-developed, well-nourished,  and in no distress. No distress.  HENT:  Head: Normocephalic and atraumatic.  Eyes: Conjunctivae and EOM are normal. Pupils are equal, round, and reactive to light. No scleral icterus.  Neck: Normal range of motion. Neck supple. Carotid bruit is not present. No thyromegaly present.  Cardiovascular: Normal rate and regular rhythm.   Occasional extrasystoles are present. Exam reveals no gallop and no friction rub.   Murmur heard.  Systolic murmur is present with a grade of 2/6  throughout  Pulmonary/Chest: Effort normal and breath sounds normal. No respiratory distress. She has no wheezes. She has no rales.  Musculoskeletal: She exhibits no edema.  Lymphadenopathy:    She has no cervical adenopathy.  Vitals reviewed.      Recent Results (from the past 2160 hour(s))  Urinalysis complete, with microscopic     Status: Abnormal   Collection Time: 10/07/15 11:06 AM  Result Value Ref Range   Color, Urine YELLOW YELLOW   APPearance CLEAR CLEAR   Glucose, UA NEGATIVE NEGATIVE mg/dL   Bilirubin Urine NEGATIVE NEGATIVE   Ketones, ur NEGATIVE NEGATIVE mg/dL   Specific Gravity, Urine 1.010 1.005 - 1.030   Hgb urine dipstick NEGATIVE NEGATIVE   pH 6.0 5.0 - 8.0   Protein, ur NEGATIVE NEGATIVE mg/dL   Nitrite NEGATIVE NEGATIVE   Leukocytes, UA NEGATIVE NEGATIVE   RBC / HPF NONE SEEN <3 RBC/hpf   WBC, UA 0-5 <3 WBC/hpf   Bacteria, UA MANY (A) RARE   Squamous Epithelial / LPF 0-5 (A) RARE   Hyaline Casts, UA PRESENT   Urine culture     Status: None   Collection Time: 10/07/15 11:06 AM  Result Value Ref Range   Specimen Description URINE, CLEAN CATCH    Special Requests Normal    Culture >=100,000 COLONIES/mL ENTEROCOCCUS FAECALIS    Report Status 10/10/2015 FINAL    Organism ID, Bacteria ENTEROCOCCUS FAECALIS       Susceptibility   Enterococcus faecalis - MIC*    AMPICILLIN <=2 SENSITIVE Sensitive     NITROFURANTOIN <=16 SENSITIVE Sensitive     LINEZOLID 2 SENSITIVE Sensitive      CIPROFLOXACIN Value in next row Sensitive      SENSITIVE1    LEVOFLOXACIN Value in next row Sensitive      SENSITIVE2    TETRACYCLINE Value in next row Resistant      RESISTANT>=16    * >=100,000 COLONIES/mL ENTEROCOCCUS FAECALIS  CBC with Differential     Status: Abnormal   Collection Time: 10/07/15 11:06 AM  Result Value Ref Range  WBC 6.3 3.6 - 11.0 K/uL   RBC 4.08 3.80 - 5.20 MIL/uL   Hemoglobin 13.4 12.0 - 16.0 g/dL   HCT 39.2 35.0 - 47.0 %   MCV 96.1 80.0 - 100.0 fL   MCH 32.8 26.0 - 34.0 pg   MCHC 34.1 32.0 - 36.0 g/dL   RDW 13.2 11.5 - 14.5 %   Platelets 259 150 - 440 K/uL   Neutrophils Relative % 80 %   Neutro Abs 5.0 1.4 - 6.5 K/uL   Lymphocytes Relative 10 %   Lymphs Abs 0.7 (L) 1.0 - 3.6 K/uL   Monocytes Relative 10 %   Monocytes Absolute 0.7 0.2 - 0.9 K/uL   Eosinophils Relative 0 %   Eosinophils Absolute 0.0 0 - 0.7 K/uL   Basophils Relative 0 %   Basophils Absolute 0.0 0 - 0.1 K/uL  Comprehensive metabolic panel     Status: Abnormal   Collection Time: 10/07/15 11:06 AM  Result Value Ref Range   Sodium 130 (L) 135 - 145 mmol/L   Potassium 4.2 3.5 - 5.1 mmol/L   Chloride 91 (L) 101 - 111 mmol/L   CO2 29 22 - 32 mmol/L   Glucose, Bld 88 65 - 99 mg/dL   BUN 16 6 - 20 mg/dL   Creatinine, Ser 0.45 0.44 - 1.00 mg/dL   Calcium 8.9 8.9 - 10.3 mg/dL   Total Protein 6.8 6.5 - 8.1 g/dL   Albumin 3.4 (L) 3.5 - 5.0 g/dL   AST 38 15 - 41 U/L   ALT 32 14 - 54 U/L   Alkaline Phosphatase 76 38 - 126 U/L   Total Bilirubin 0.4 0.3 - 1.2 mg/dL   GFR calc non Af Amer >60 >60 mL/min   GFR calc Af Amer >60 >60 mL/min    Comment: (NOTE) The eGFR has been calculated using the CKD EPI equation. This calculation has not been validated in all clinical situations. eGFR's persistently <60 mL/min signify possible Chronic Kidney Disease.    Anion gap 10 5 - 15  CBC     Status: None   Collection Time: 10/11/15 12:22 AM  Result Value Ref Range   WBC 5.0 3.6 - 11.0 K/uL   RBC  3.85 3.80 - 5.20 MIL/uL   Hemoglobin 12.6 12.0 - 16.0 g/dL   HCT 37.4 35.0 - 47.0 %   MCV 97.1 80.0 - 100.0 fL   MCH 32.7 26.0 - 34.0 pg   MCHC 33.7 32.0 - 36.0 g/dL   RDW 13.2 11.5 - 14.5 %   Platelets 293 150 - 440 K/uL  Comprehensive metabolic panel     Status: Abnormal   Collection Time: 10/11/15 12:22 AM  Result Value Ref Range   Sodium 133 (L) 135 - 145 mmol/L   Potassium 4.1 3.5 - 5.1 mmol/L   Chloride 101 101 - 111 mmol/L   CO2 27 22 - 32 mmol/L   Glucose, Bld 106 (H) 65 - 99 mg/dL   BUN 23 (H) 6 - 20 mg/dL   Creatinine, Ser 0.62 0.44 - 1.00 mg/dL   Calcium 8.4 (L) 8.9 - 10.3 mg/dL   Total Protein 6.5 6.5 - 8.1 g/dL   Albumin 3.1 (L) 3.5 - 5.0 g/dL   AST 41 15 - 41 U/L   ALT 40 14 - 54 U/L   Alkaline Phosphatase 78 38 - 126 U/L   Total Bilirubin 0.4 0.3 - 1.2 mg/dL   GFR calc non Af Amer >60 >60 mL/min  GFR calc Af Amer >60 >60 mL/min    Comment: (NOTE) The eGFR has been calculated using the CKD EPI equation. This calculation has not been validated in all clinical situations. eGFR's persistently <60 mL/min signify possible Chronic Kidney Disease.    Anion gap 5 5 - 15  Troponin I     Status: None   Collection Time: 10/11/15 12:22 AM  Result Value Ref Range   Troponin I <0.03 <0.031 ng/mL    Comment:        NO INDICATION OF MYOCARDIAL INJURY.   Brain natriuretic peptide     Status: Abnormal   Collection Time: 10/11/15 12:22 AM  Result Value Ref Range   B Natriuretic Peptide 1528.0 (H) 0.0 - 100.0 pg/mL  Culture, blood (routine x 2)     Status: None   Collection Time: 10/11/15  2:47 AM  Result Value Ref Range   Specimen Description BLOOD RIGHT ANTECUBITAL    Special Requests BOTTLES DRAWN AEROBIC AND ANAEROBIC 5CC    Culture NO GROWTH 5 DAYS    Report Status 10/16/2015 FINAL   Culture, blood (routine x 2)     Status: None   Collection Time: 10/11/15  2:47 AM  Result Value Ref Range   Specimen Description BLOOD RIGHT ARM    Special Requests BOTTLES DRAWN  AEROBIC AND ANAEROBIC 5CC    Culture NO GROWTH 5 DAYS    Report Status 10/16/2015 FINAL   CBC     Status: None   Collection Time: 10/11/15  5:41 AM  Result Value Ref Range   WBC 4.3 3.6 - 11.0 K/uL   RBC 3.98 3.80 - 5.20 MIL/uL   Hemoglobin 13.1 12.0 - 16.0 g/dL   HCT 38.8 35.0 - 47.0 %   MCV 97.5 80.0 - 100.0 fL   MCH 32.8 26.0 - 34.0 pg   MCHC 33.7 32.0 - 36.0 g/dL   RDW 13.8 11.5 - 14.5 %   Platelets 218 150 - 440 K/uL  Basic metabolic panel     Status: Abnormal   Collection Time: 10/11/15  5:41 AM  Result Value Ref Range   Sodium 133 (L) 135 - 145 mmol/L   Potassium 3.6 3.5 - 5.1 mmol/L   Chloride 101 101 - 111 mmol/L   CO2 25 22 - 32 mmol/L   Glucose, Bld 136 (H) 65 - 99 mg/dL   BUN 18 6 - 20 mg/dL   Creatinine, Ser 0.44 0.44 - 1.00 mg/dL   Calcium 8.0 (L) 8.9 - 10.3 mg/dL   GFR calc non Af Amer >60 >60 mL/min   GFR calc Af Amer >60 >60 mL/min    Comment: (NOTE) The eGFR has been calculated using the CKD EPI equation. This calculation has not been validated in all clinical situations. eGFR's persistently <60 mL/min signify possible Chronic Kidney Disease.    Anion gap 7 5 - 15  Basic metabolic panel     Status: Abnormal   Collection Time: 10/12/15  5:49 AM  Result Value Ref Range   Sodium 135 135 - 145 mmol/L   Potassium 3.9 3.5 - 5.1 mmol/L   Chloride 103 101 - 111 mmol/L   CO2 26 22 - 32 mmol/L   Glucose, Bld 95 65 - 99 mg/dL   BUN 15 6 - 20 mg/dL   Creatinine, Ser 0.39 (L) 0.44 - 1.00 mg/dL   Calcium 7.9 (L) 8.9 - 10.3 mg/dL   GFR calc non Af Amer >60 >60 mL/min   GFR calc Af  Amer >60 >60 mL/min    Comment: (NOTE) The eGFR has been calculated using the CKD EPI equation. This calculation has not been validated in all clinical situations. eGFR's persistently <60 mL/min signify possible Chronic Kidney Disease.    Anion gap 6 5 - 15  Basic metabolic panel     Status: Abnormal   Collection Time: 10/13/15  4:27 AM  Result Value Ref Range   Sodium 134 (L)  135 - 145 mmol/L   Potassium 4.0 3.5 - 5.1 mmol/L   Chloride 102 101 - 111 mmol/L   CO2 26 22 - 32 mmol/L   Glucose, Bld 89 65 - 99 mg/dL   BUN 15 6 - 20 mg/dL   Creatinine, Ser 0.36 (L) 0.44 - 1.00 mg/dL   Calcium 8.0 (L) 8.9 - 10.3 mg/dL   GFR calc non Af Amer >60 >60 mL/min   GFR calc Af Amer >60 >60 mL/min    Comment: (NOTE) The eGFR has been calculated using the CKD EPI equation. This calculation has not been validated in all clinical situations. eGFR's persistently <60 mL/min signify possible Chronic Kidney Disease.    Anion gap 6 5 - 15  Basic Metabolic Panel (BMET)     Status: Abnormal   Collection Time: 10/19/15  3:18 PM  Result Value Ref Range   Glucose 94 65 - 99 mg/dL    Comment: Specimen received in contact with cells. No visible hemolysis present. However GLUC may be decreased and K increased. Clinical correlation indicated.    BUN 13 8 - 27 mg/dL   Creatinine, Ser 0.43 (L) 0.57 - 1.00 mg/dL   GFR calc non Af Amer 95 >59 mL/min/1.73   GFR calc Af Amer 110 >59 mL/min/1.73   BUN/Creatinine Ratio 30 (H) 11 - 26   Sodium 133 (L) 136 - 144 mmol/L   Potassium 4.4 3.5 - 5.2 mmol/L   Chloride 89 (L) 97 - 106 mmol/L   CO2 24 18 - 29 mmol/L   Calcium 9.1 8.7 - 10.3 mg/dL     Assessment & Plan  Problem List Items Addressed This Visit      Cardiovascular and Mediastinum   Chronic diastolic CHF (congestive heart failure) ([facility])     Respiratory   Pneumonia - Primary   Relevant Orders   DG Chest 2 View     1. Pneumonia, unspecified laterality, unspecified part of lung  - DG Chest 2 View; Future  2. Chronic diastolic CHF (congestive heart failure) ([facility])  No orders of the defined types were placed in this encounter.

## 2015-11-08 NOTE — Patient Instructions (Signed)
Continue all current meds

## 2015-11-09 ENCOUNTER — Encounter: Payer: Self-pay | Admitting: Family

## 2015-11-09 ENCOUNTER — Ambulatory Visit: Payer: PPO | Attending: Family | Admitting: Family

## 2015-11-09 VITALS — BP 122/63 | HR 70 | Resp 18 | Ht <= 58 in | Wt 91.0 lb

## 2015-11-09 DIAGNOSIS — K219 Gastro-esophageal reflux disease without esophagitis: Secondary | ICD-10-CM | POA: Insufficient documentation

## 2015-11-09 DIAGNOSIS — I5032 Chronic diastolic (congestive) heart failure: Secondary | ICD-10-CM | POA: Diagnosis not present

## 2015-11-09 DIAGNOSIS — Z880 Allergy status to penicillin: Secondary | ICD-10-CM | POA: Insufficient documentation

## 2015-11-09 DIAGNOSIS — Z7982 Long term (current) use of aspirin: Secondary | ICD-10-CM | POA: Insufficient documentation

## 2015-11-09 DIAGNOSIS — I340 Nonrheumatic mitral (valve) insufficiency: Secondary | ICD-10-CM | POA: Insufficient documentation

## 2015-11-09 DIAGNOSIS — J449 Chronic obstructive pulmonary disease, unspecified: Secondary | ICD-10-CM | POA: Diagnosis not present

## 2015-11-09 DIAGNOSIS — M51369 Other intervertebral disc degeneration, lumbar region without mention of lumbar back pain or lower extremity pain: Secondary | ICD-10-CM

## 2015-11-09 DIAGNOSIS — Z79899 Other long term (current) drug therapy: Secondary | ICD-10-CM | POA: Diagnosis not present

## 2015-11-09 DIAGNOSIS — M810 Age-related osteoporosis without current pathological fracture: Secondary | ICD-10-CM | POA: Diagnosis not present

## 2015-11-09 DIAGNOSIS — M5136 Other intervertebral disc degeneration, lumbar region: Secondary | ICD-10-CM

## 2015-11-09 NOTE — Patient Instructions (Addendum)
Continue weighing daily and call for an overnight weight gain of > 2 pounds or a weekly weight gain of >5 pounds. 

## 2015-11-09 NOTE — Progress Notes (Signed)
Subjective:    Patient ID: Jennifer Skinner, female    DOB: 1931-09-10, 81 y.o.   MRN: [id number]  Congestive Heart Failure Presents for initial visit. The disease course has been stable. Associated symptoms include fatigue. Pertinent negatives include no abdominal pain, chest pain, edema, orthopnea, palpitations, paroxysmal nocturnal dyspnea or shortness of breath. The symptoms have been stable. Past treatments include beta blockers and salt and fluid restriction. The treatment provided moderate relief. Compliance with prior treatments has been good. There is no history of CVA or HTN. She has one 1st degree relative with heart disease. Compliance with total regimen is 76-100%.  Other This is a chronic (fatigue) problem. The current episode started more than 1 month ago. The problem occurs daily. The problem has been gradually improving. Associated symptoms include coughing (phlegm) and fatigue. Pertinent negatives include no abdominal pain, chest pain, congestion, headaches, sore throat or weakness. The symptoms are aggravated by exertion. She has tried rest for the symptoms. The treatment provided mild relief.    Past Medical History  Diagnosis Date  . Mitral prolapse   . Mitral regurgitation   . Gastro-esophageal reflux   . Osteoporosis   . Arthritis   . Chronic diastolic CHF (congestive heart failure) ([place])     a. echo 2014: EF 55-60%, nl wall motion, GR1DD, mild MR, moder mitral prolapse, PASP 40 mm Hg; b. echo 09/2015: EF 60-65%, nl wall motion, GR1DD, mod MR, mild to mod TR, PASP 56 mm Hg  . COPD (chronic obstructive pulmonary disease) ([place])   . 1St degree AV block   . Degenerative disc disease, lumbar     Past Surgical History  Procedure Laterality Date  . Dilation and curettage of uterus    . Right eye       surgery  . Hysterotomy    . Right breast biopsy    . Tonsillectomy      Family History  Problem Relation Age of Onset  . Heart attack Father 70  . Throat cancer  Sister   . CAD Brother   . CVA Mother 26    Social History  Substance Use Topics  . Smoking status: Never Smoker   . Smokeless tobacco: Never Used  . Alcohol Use: No    Allergies  Allergen Reactions  . Penicillins     Prior to Admission medications   Medication Sig Start Date End Date Taking? Authorizing Provider  albuterol (PROVENTIL) (2.5 MG/3ML) 0.083% nebulizer solution Take 3 mLs (2.5 mg total) by nebulization every 6 (six) hours as needed for wheezing or shortness of breath. 10/12/15  Yes Epifanio Lesches, MD  Apple Cider Vinegar (APPLE CIDER VINEGAR ULTRA) 600 MG CAPS 2 teaspoons once a day   Yes Historical Provider, MD  aspirin 325 MG tablet Take 325 mg by mouth daily.   Yes Historical Provider, MD  furosemide (LASIX) 20 MG tablet Take 1 tablet (20 mg total) by mouth 2 (two) times daily. 10/19/15  Yes Ryan M Dunn, PA-C  gabapentin (NEURONTIN) 100 MG capsule Take 1 capsule (100 mg total) by mouth 3 (three) times daily. 09/27/15  Yes Arlis Porta., MD  levothyroxine (SYNTHROID, LEVOTHROID) 25 MCG tablet Take 1 tablet (25 mcg total) by mouth daily before breakfast. Ran out of meds. 09/27/15  Yes Arlis Porta., MD  metoprolol tartrate (LOPRESSOR) 25 MG tablet Take 0.5 tablets (12.5 mg total) by mouth 2 (two) times daily. 10/19/15  Yes Ryan M Dunn, PA-C  Multiple Vitamin (MULTI-VITAMIN  DAILY PO) Take by mouth daily.   Yes Historical Provider, MD  [name] (OYSTER CALCIUM) 500 MG TABS Take 500 mg of elemental calcium by mouth 2 (two) times daily.   Yes Historical Provider, MD  polyethylene glycol powder (GLYCOLAX/MIRALAX) powder  10/13/15  Yes Historical Provider, MD  potassium chloride SA (K-DUR,KLOR-CON) 20 MEQ tablet Take 1 tablet (20 mEq total) by mouth 2 (two) times daily. 10/19/15  Yes Ryan M Dunn, PA-C  traMADol (ULTRAM) 50 MG tablet Take 1 tablet by mouth 2 (two) times daily. 08/23/14  Yes Historical Provider, MD     Review of Systems  Constitutional:  Positive for fatigue. Negative for appetite change.  HENT: Positive for nosebleeds (this morning). Negative for congestion and sore throat.   Eyes: Negative for pain and visual disturbance.  Respiratory: Positive for cough (phlegm). Negative for chest tightness and shortness of breath.   Cardiovascular: Negative for chest pain, palpitations and leg swelling.  Gastrointestinal: Negative for abdominal pain and abdominal distention.  Endocrine: Negative.   Genitourinary: Negative.   Musculoskeletal: Positive for back pain and neck stiffness.  Skin: Negative.   Allergic/Immunologic: Negative.   Neurological: Positive for dizziness (at times). Negative for weakness, light-headedness and headaches.  Hematological: Negative for adenopathy. Does not bruise/bleed easily.  Psychiatric/Behavioral: Negative for sleep disturbance (sleeping on 1 pillow) and dysphoric mood. The patient is not nervous/anxious.        Objective:   Physical Exam  Constitutional: She is oriented to person, place, and time. She appears well-developed and well-nourished.  HENT:  Head: Normocephalic and atraumatic.  Eyes: Conjunctivae are normal. Pupils are equal, round, and reactive to light.  Neck: Normal range of motion. Neck supple.  Cardiovascular: Normal rate and regular rhythm.   Pulmonary/Chest: Effort normal. She has no wheezes. She has no rales.  Abdominal: Soft. She exhibits no distension. There is no tenderness.  Musculoskeletal: She exhibits no edema or tenderness.  Neurological: She is alert and oriented to person, place, and time.  Skin: Skin is warm and dry.  Psychiatric: She has a normal mood and affect. Her behavior is normal. Thought content normal.  Nursing note and vitals reviewed.   BP 122/63 mmHg  Pulse 70  Resp 18  Ht 4\' 9"  (1.448 m)  Wt 91 lb (41.277 kg)  BMI 19.69 kg/m2  SpO2 94%       Assessment & Plan:  1: Chronic heart failure with preserved ejection fraction- Patient presents  with some fatigue upon exertion. She says that she was a little tired upon walking into the office today but once she sat down for a few minutes, her energy level returned. She is already weighing herself daily and says that her weight has been stable. Discussed the importance of weighing herself every morning and calling for an overnight weight gain of >2 pounds or a weekly weight gain of >5 pounds. She does not add salt to her food and tries to eat low sodium foods. She does eat lunchmeat for sandwiches but says that it's only about once a week. Discussed the importance of following a 2000mg  sodium diet and written information was given to her about that. She does mention that her potassium tablets gets stuck in her throat even when breaking them in half. [facility] went in and advised patient that she could let it dissolve in 4 ounces of water or we could switch her to capsules so that she could open them up. She is going to try dissolving it  and will let us know if she wants to switch to capsules. 2: COPD- She denies any shortness of breath at this time.  3: Degenerative disc disease- She does take tramadol and gabapentin for her pain and follows with Dr. Sharlet Salina regarding this.   Return here in 1 month or sooner for any questions/problems before then.

## 2015-11-29 ENCOUNTER — Ambulatory Visit: Payer: BC Managed Care – PPO | Admitting: Family Medicine

## 2015-12-02 ENCOUNTER — Ambulatory Visit
Admission: EM | Admit: 2015-12-02 | Discharge: 2015-12-02 | Disposition: A | Discharge status: Home / Self Care | Payer: PPO | Attending: Family Medicine | Admitting: Family Medicine

## 2015-12-02 ENCOUNTER — Telehealth: Payer: Self-pay

## 2015-12-02 DIAGNOSIS — I5032 Chronic diastolic (congestive) heart failure: Secondary | ICD-10-CM | POA: Insufficient documentation

## 2015-12-02 DIAGNOSIS — Z7982 Long term (current) use of aspirin: Secondary | ICD-10-CM | POA: Diagnosis not present

## 2015-12-02 DIAGNOSIS — J449 Chronic obstructive pulmonary disease, unspecified: Secondary | ICD-10-CM | POA: Diagnosis not present

## 2015-12-02 DIAGNOSIS — K219 Gastro-esophageal reflux disease without esophagitis: Secondary | ICD-10-CM | POA: Insufficient documentation

## 2015-12-02 DIAGNOSIS — Z880 Allergy status to penicillin: Secondary | ICD-10-CM | POA: Insufficient documentation

## 2015-12-02 DIAGNOSIS — R531 Weakness: Secondary | ICD-10-CM | POA: Diagnosis present

## 2015-12-02 DIAGNOSIS — E162 Hypoglycemia, unspecified: Secondary | ICD-10-CM | POA: Insufficient documentation

## 2015-12-02 DIAGNOSIS — R251 Tremor, unspecified: Secondary | ICD-10-CM | POA: Diagnosis not present

## 2015-12-02 LAB — CBC WITH DIFFERENTIAL/PLATELET
BASOS ABS: 0 10*3/uL (ref 0–0.1)
BASOS PCT: 1 %
EOS ABS: 0 10*3/uL (ref 0–0.7)
EOS PCT: 1 %
HEMATOCRIT: 38.6 % (ref 35.0–47.0)
Hemoglobin: 13.1 g/dL (ref 12.0–16.0)
Lymphocytes Relative: 18 %
Lymphs Abs: 0.8 10*3/uL — ABNORMAL LOW (ref 1.0–3.6)
MCH: 32.1 pg (ref 26.0–34.0)
MCHC: 33.9 g/dL (ref 32.0–36.0)
MCV: 94.8 fL (ref 80.0–100.0)
MONO ABS: 0.5 10*3/uL (ref 0.2–0.9)
MONOS PCT: 10 %
NEUTROS ABS: 3.2 10*3/uL (ref 1.4–6.5)
Neutrophils Relative %: 70 %
Platelets: 225 10*3/uL (ref 150–440)
RBC: 4.07 MIL/uL (ref 3.80–5.20)
RDW: 13.2 % (ref 11.5–14.5)
WBC: 4.6 10*3/uL (ref 3.6–11.0)

## 2015-12-02 LAB — URINALYSIS COMPLETE WITH MICROSCOPIC (ARMC ONLY)
Bacteria, UA: NONE SEEN
Bilirubin Urine: NEGATIVE
Glucose, UA: NEGATIVE mg/dL
KETONES UR: NEGATIVE mg/dL
NITRITE: NEGATIVE
PH: 6 (ref 5.0–8.0)
PROTEIN: NEGATIVE mg/dL
SPECIFIC GRAVITY, URINE: 1.01 (ref 1.005–1.030)

## 2015-12-02 LAB — BASIC METABOLIC PANEL
Anion gap: 8 (ref 5–15)
BUN: 18 mg/dL (ref 6–20)
CHLORIDE: 96 mmol/L — AB (ref 101–111)
CO2: 28 mmol/L (ref 22–32)
CREATININE: 0.39 mg/dL — AB (ref 0.44–1.00)
Calcium: 8.9 mg/dL (ref 8.9–10.3)
GFR calc non Af Amer: >60 mL/min (ref >60)
Glucose, Bld: 100 mg/dL — ABNORMAL HIGH (ref 65–99)
Potassium: 4 mmol/L (ref 3.5–5.1)
Sodium: 132 mmol/L — ABNORMAL LOW (ref 135–145)

## 2015-12-02 LAB — GLUCOSE, CAPILLARY: GLUCOSE-CAPILLARY: 87 mg/dL (ref 65–99)

## 2015-12-02 LAB — TROPONIN I: Troponin I: <0.03 ng/mL (ref <0.031)

## 2015-12-02 NOTE — ED Notes (Signed)
Woke up this morning and states became weak and shakey. Took 2 ASA and now "feels quite a bit better". PMD office instructed to go to ER or come to Urgent Care. States feels OK right now.

## 2015-12-02 NOTE — Telephone Encounter (Signed)
Agree-jh 

## 2015-12-02 NOTE — ED Notes (Signed)
Family at bedside. Accucheck done - 87 mg/dL

## 2015-12-02 NOTE — ED Provider Notes (Signed)
CSN: [id number]     Arrival date & time 12/02/15  1107 History   First MD Initiated Contact with Patient 12/02/15 1158     Chief Complaint  Patient presents with  . Weakness   (Consider location/radiation/quality/duration/timing/severity/associated sxs/prior Treatment) HPI: Patient presents today with symptoms of shakiness while on the commode. Patient states that she woke up this morning walked over to the bathroom and while she was seated she started to shake and felt generalized weakness. She states that all of her extremities were shaking some. She denied any chest pain, shortness of breath, diaphoresis, headache during this episode. She went and proceeded to get ready and then sat down to have breakfast. She had some cereal and took her aspirin and Toprol medication. She states that she takes Toprol for palpitations. Her symptoms seemed to resolve within an hour's time. Here in the office now she has no symptoms. She denies any history of loss of consciousness or syncope today. She admits to eating last night around 5 PM. She had no food or fluids until this morning. The symptoms happened around 6 AM this morning.  Past Medical History  Diagnosis Date  . Mitral prolapse   . Mitral regurgitation   . Gastro-esophageal reflux   . Osteoporosis   . Arthritis   . Chronic diastolic CHF (congestive heart failure) ([place])     a. echo 2014: EF 55-60%, nl wall motion, GR1DD, mild MR, moder mitral prolapse, PASP 40 mm Hg; b. echo 09/2015: EF 60-65%, nl wall motion, GR1DD, mod MR, mild to mod TR, PASP 56 mm Hg  . COPD (chronic obstructive pulmonary disease) ([place])   . 1St degree AV block   . Degenerative disc disease, lumbar    Past Surgical History  Procedure Laterality Date  . Dilation and curettage of uterus    . Right eye       surgery  . Hysterotomy    . Right breast biopsy    . Tonsillectomy    . Abdominal hysterectomy    . Appendectomy     Family History  Problem Relation Age of Onset   . Heart attack Father 14  . Throat cancer Sister   . CAD Brother   . CVA Mother 26   Social History  Substance Use Topics  . Smoking status: Never Smoker   . Smokeless tobacco: Never Used  . Alcohol Use: No   OB History    No data available     Review of Systems: Negative except mentioned above.  Allergies  Penicillins  Home Medications   Prior to Admission medications   Medication Sig Start Date End Date Taking? Authorizing Provider  aspirin 325 MG tablet Take 325 mg by mouth daily.   Yes Historical Provider, MD  furosemide (LASIX) 20 MG tablet Take 1 tablet (20 mg total) by mouth 2 (two) times daily. 10/19/15  Yes Ryan M Dunn, PA-C  gabapentin (NEURONTIN) 100 MG capsule Take 1 capsule (100 mg total) by mouth 3 (three) times daily. 09/27/15  Yes Arlis Porta., MD  levothyroxine (SYNTHROID, LEVOTHROID) 25 MCG tablet Take 1 tablet (25 mcg total) by mouth daily before breakfast. Ran out of meds. 09/27/15  Yes Arlis Porta., MD  metoprolol tartrate (LOPRESSOR) 25 MG tablet Take 0.5 tablets (12.5 mg total) by mouth 2 (two) times daily. 10/19/15  Yes Ryan M Dunn, PA-C  Multiple Vitamin (MULTI-VITAMIN DAILY PO) Take by mouth daily.   Yes Historical Provider, MD  potassium chloride SA (  K-DUR,KLOR-CON) 20 MEQ tablet Take 1 tablet (20 mEq total) by mouth 2 (two) times daily. 10/19/15  Yes Ryan M Dunn, PA-C  traMADol (ULTRAM) 50 MG tablet Take 1 tablet by mouth 2 (two) times daily. 08/23/14  Yes Historical Provider, MD  albuterol (PROVENTIL) (2.5 MG/3ML) 0.083% nebulizer solution Take 3 mLs (2.5 mg total) by nebulization every 6 (six) hours as needed for wheezing or shortness of breath. 10/12/15   Epifanio Lesches, MD  Apple Cider Vinegar (APPLE CIDER VINEGAR ULTRA) 600 MG CAPS 2 teaspoons once a day    Historical Provider, MD  [name] (OYSTER CALCIUM) 500 MG TABS Take 500 mg of elemental calcium by mouth 2 (two) times daily.    Historical Provider, MD  polyethylene glycol  powder (GLYCOLAX/MIRALAX) powder  10/13/15   Historical Provider, MD   Meds Ordered and Administered this Visit  Medications - No data to display  BP 119/53 mmHg  Pulse 69  Temp(Src) 97.3 F (36.3 C) (Tympanic)  Resp 16  Ht 4\' 10"  (1.473 m)  Wt 89 lb (40.37 kg)  BMI 18.61 kg/m2  SpO2 95% No data found.   Physical Exam   GENERAL: NAD HEENT: no pharyngeal erythema, no exudate, no erythema of TMs, no cervical LAD RESP: CTA B CARD: RRR MSK: normal equal strength of all extremities NEURO: AAOx3, CN II-XII grossly intact, no tremor appreciated   ED Course  Procedures (including critical care time)  Labs Review Labs Reviewed  URINALYSIS COMPLETEWITH MICROSCOPIC ([facility] ONLY) - Abnormal; Notable for the following:    Color, Urine STRAW (*)    Hgb urine dipstick 1+ (*)    Leukocytes, UA TRACE (*)    Squamous Epithelial / LPF 0-5 (*)    All other components within normal limits  CBC WITH DIFFERENTIAL/PLATELET - Abnormal; Notable for the following:    Lymphs Abs 0.8 (*)    All other components within normal limits  BASIC METABOLIC PANEL - Abnormal; Notable for the following:    Sodium 132 (*)    Chloride 96 (*)    Glucose, Bld 100 (*)    Creatinine, Ser 0.39 (*)    All other components within normal limits  GLUCOSE, CAPILLARY  TROPONIN I  CBG MONITORING, ED      MDM   1. Hypoglycemia   2. Shaking    EKG was done which showed sinus rhythm with first-degree block with possible PACs. There is no evidence of any ST elevation or depression. There does not appear to be any significant T-wave abnormalities. Agree with reading on EKG form. Labs were also done which included CBC, BMP, troponin, UA. Discussed labs with patient and advised patient follow up with primary care physician regarding slightly low sodium level. Other labs were essentially normal. Patient remained absolutely asymptomatic while in the office today. Her finger stick blood sugar was normal. I discussed with  the patient that her symptoms earlier could have been due to hypoglycemia. There does not appear to be any neurological deficits. Family member with her states that she has been acting normal today. I've encouraged the patient to eat small meals and not skip meals. She should follow-up with her primary care physician regarding this as well. If her symptoms were to come back or if she were to have any new symptoms I have advised that she seek immediate medical attention. Patient addresses understanding of this and will do so.    Paulina Fusi, MD 12/02/15 [phone]

## 2015-12-02 NOTE — Telephone Encounter (Signed)
Patient woke up trembling all over. Cold sweats. Took Aspirin and Metoprolol and it eased. She is afraid it may come back. Advised Urgent Care/ER and she is compliant. [facility]

## 2015-12-14 ENCOUNTER — Encounter: Payer: Self-pay | Admitting: Family

## 2015-12-14 ENCOUNTER — Ambulatory Visit: Payer: PPO | Attending: Family | Admitting: Family

## 2015-12-14 VITALS — BP 126/67 | HR 66 | Resp 18 | Ht <= 58 in | Wt 92.0 lb

## 2015-12-14 DIAGNOSIS — Z7982 Long term (current) use of aspirin: Secondary | ICD-10-CM | POA: Diagnosis not present

## 2015-12-14 DIAGNOSIS — K219 Gastro-esophageal reflux disease without esophagitis: Secondary | ICD-10-CM | POA: Diagnosis not present

## 2015-12-14 DIAGNOSIS — I5032 Chronic diastolic (congestive) heart failure: Secondary | ICD-10-CM | POA: Diagnosis not present

## 2015-12-14 DIAGNOSIS — Z880 Allergy status to penicillin: Secondary | ICD-10-CM | POA: Diagnosis not present

## 2015-12-14 DIAGNOSIS — Z79899 Other long term (current) drug therapy: Secondary | ICD-10-CM | POA: Insufficient documentation

## 2015-12-14 DIAGNOSIS — J449 Chronic obstructive pulmonary disease, unspecified: Secondary | ICD-10-CM | POA: Diagnosis not present

## 2015-12-14 DIAGNOSIS — M5136 Other intervertebral disc degeneration, lumbar region: Secondary | ICD-10-CM | POA: Diagnosis not present

## 2015-12-14 DIAGNOSIS — Z9889 Other specified postprocedural states: Secondary | ICD-10-CM | POA: Diagnosis not present

## 2015-12-14 DIAGNOSIS — M1990 Unspecified osteoarthritis, unspecified site: Secondary | ICD-10-CM | POA: Diagnosis not present

## 2015-12-14 DIAGNOSIS — I340 Nonrheumatic mitral (valve) insufficiency: Secondary | ICD-10-CM | POA: Diagnosis not present

## 2015-12-14 DIAGNOSIS — M810 Age-related osteoporosis without current pathological fracture: Secondary | ICD-10-CM | POA: Insufficient documentation

## 2015-12-14 NOTE — Patient Instructions (Signed)
Continue weighing daily and call for an overnight weight gain of > 2 pounds or a weekly weight gain of >5 pounds. 

## 2015-12-14 NOTE — Progress Notes (Signed)
Subjective:    Patient ID: Jennifer Skinner, female    DOB: June 13, 1931, 82 y.o.   MRN: [id number]  Congestive Heart Failure Presents for follow-up visit. The disease course has been stable. Associated symptoms include fatigue. Pertinent negatives include no abdominal pain, chest pain, chest pressure, edema, orthopnea, palpitations or shortness of breath. The symptoms have been stable. Past treatments include beta blockers and salt and fluid restriction. The treatment provided moderate relief. Compliance with prior treatments has been good. Her past medical history is significant for chronic lung disease. She has one 1st degree relative with heart disease. Compliance with total regimen is 76-100%.  Back Pain This is a chronic problem. The current episode started more than 1 year ago. The problem occurs constantly. The problem is unchanged. The pain is present in the lumbar spine. The quality of the pain is described as burning. The pain does not radiate. The symptoms are aggravated by bending and sitting. Stiffness is present all day. Pertinent negatives include no abdominal pain, chest pain or headaches. She has tried analgesics (pain clinic) for the symptoms. The treatment provided mild relief.   Past Medical History  Diagnosis Date  . Mitral prolapse   . Mitral regurgitation   . Gastro-esophageal reflux   . Osteoporosis   . Arthritis   . Chronic diastolic CHF (congestive heart failure) ([place])     a. echo 2014: EF 55-60%, nl wall motion, GR1DD, mild MR, moder mitral prolapse, PASP 40 mm Hg; b. echo 09/2015: EF 60-65%, nl wall motion, GR1DD, mod MR, mild to mod TR, PASP 56 mm Hg  . COPD (chronic obstructive pulmonary disease) ([place])   . 1St degree AV block   . Degenerative disc disease, lumbar     Past Surgical History  Procedure Laterality Date  . Dilation and curettage of uterus    . Right eye       surgery  . Hysterotomy    . Right breast biopsy    . Tonsillectomy    . Abdominal  hysterectomy    . Appendectomy      Family History  Problem Relation Age of Onset  . Heart attack Father 41  . Throat cancer Sister   . CAD Brother   . CVA Mother 14    Social History  Substance Use Topics  . Smoking status: Never Smoker   . Smokeless tobacco: Never Used  . Alcohol Use: No    Allergies  Allergen Reactions  . Penicillins     Prior to Admission medications   Medication Sig Start Date End Date Taking? Authorizing Provider  albuterol (PROVENTIL) (2.5 MG/3ML) 0.083% nebulizer solution Take 3 mLs (2.5 mg total) by nebulization every 6 (six) hours as needed for wheezing or shortness of breath. 10/12/15  Yes Epifanio Lesches, MD  Apple Cider Vinegar (APPLE CIDER VINEGAR ULTRA) 600 MG CAPS 2 teaspoons once a day   Yes Historical Provider, MD  aspirin 325 MG tablet Take 325 mg by mouth daily.   Yes Historical Provider, MD  furosemide (LASIX) 20 MG tablet Take 1 tablet (20 mg total) by mouth 2 (two) times daily. 10/19/15  Yes Ryan M Dunn, PA-C  gabapentin (NEURONTIN) 100 MG capsule Take 1 capsule (100 mg total) by mouth 3 (three) times daily. 09/27/15  Yes Arlis Porta., MD  levothyroxine (SYNTHROID, LEVOTHROID) 25 MCG tablet Take 1 tablet (25 mcg total) by mouth daily before breakfast. Ran out of meds. 09/27/15  Yes Arlis Porta., MD  metoprolol tartrate (LOPRESSOR) 25 MG tablet Take 0.5 tablets (12.5 mg total) by mouth 2 (two) times daily. 10/19/15  Yes Ryan M Dunn, PA-C  Multiple Vitamin (MULTI-VITAMIN DAILY PO) Take by mouth daily.   Yes Historical Provider, MD  [name] (OYSTER CALCIUM) 500 MG TABS Take 500 mg of elemental calcium by mouth 2 (two) times daily.   Yes Historical Provider, MD  polyethylene glycol powder (GLYCOLAX/MIRALAX) powder  10/13/15  Yes Historical Provider, MD  potassium chloride SA (K-DUR,KLOR-CON) 20 MEQ tablet Take 1 tablet (20 mEq total) by mouth 2 (two) times daily. 10/19/15  Yes Ryan M Dunn, PA-C  traMADol (ULTRAM) 50 MG tablet  Take 1 tablet by mouth 2 (two) times daily. 08/23/14  Yes Historical Provider, MD      Review of Systems  Constitutional: Positive for fatigue. Negative for appetite change.  HENT: Positive for trouble swallowing (with calcium tablets). Negative for congestion, postnasal drip and sore throat.   Eyes: Negative.   Respiratory: Negative for cough, chest tightness and shortness of breath.   Cardiovascular: Negative for chest pain, palpitations and leg swelling.  Gastrointestinal: Negative for abdominal pain and abdominal distention.  Endocrine: Negative.   Genitourinary: Negative.   Musculoskeletal: Positive for back pain (chronic) and neck stiffness.  Skin: Negative.   Allergic/Immunologic: Negative.   Neurological: Positive for light-headedness. Negative for dizziness and headaches.  Hematological: Negative for adenopathy. Bruises/bleeds easily.  Psychiatric/Behavioral: Negative for sleep disturbance (sleeping on 1 pillow) and dysphoric mood. The patient is not nervous/anxious.        Objective:   Physical Exam  Constitutional: She is oriented to person, place, and time. She appears well-developed and well-nourished.  HENT:  Head: Normocephalic and atraumatic.  Eyes: Conjunctivae are normal. Pupils are equal, round, and reactive to light.  Neck: Normal range of motion. Neck supple.  Cardiovascular: Normal rate and regular rhythm.   Pulmonary/Chest: Effort normal. She has no wheezes. She has no rales.  Abdominal: Soft. She exhibits no distension. There is no tenderness.  Musculoskeletal: She exhibits no edema or tenderness.  Neurological: She is alert and oriented to person, place, and time.  Skin: Skin is warm and dry.  Psychiatric: She has a normal mood and affect. Her behavior is normal. Thought content normal.  Nursing note and vitals reviewed.   BP 126/67 mmHg  Pulse 66  Resp 18  Ht 4\' 9"  (1.448 m)  Wt 92 lb (41.731 kg)  BMI 19.90 kg/m2  SpO2 98%       Assessment &  Plan:  1: Chronic heart failure with preserved ejection fraction- Patient presents with fatigue upon exertion but denies any shortness of breath or swelling in her abdomen or her lower legs. She continues to weigh herself daily and reports a stable weight. By our scale, she has gained 1 pound in the last month. Reminded to call for an overnight weight gain of >2 pounds or a weekly weight gain of >5 pounds. She is not adding salt to her food and tries to follow a low sodium diet. [facility] PharmD went in and reviewed medications with the patient.  2: COPD- She currently denies any shortness of breath at this time. 3: Degenerative disc disease- She says that her back pain is chronic in nature and that nothing really helps it. She does take tramadol and neurontin but with only minimal relief. She does follow with Dr. Sharlet Salina regarding this.  When reviewing her medications, she says that she has trouble swallowing her calcium tablets  and not her potassium that she told us last time. Discussed possibly switching to chewable calcium but she thinks she'll continue breaking the calcium in half as she says that she can swallow it ok like that.   Return in 3 months or sooner for any questions/problems before then.

## 2016-01-04 ENCOUNTER — Ambulatory Visit (INDEPENDENT_AMBULATORY_CARE_PROVIDER_SITE_OTHER): Payer: PPO | Admitting: Cardiovascular Disease

## 2016-01-04 ENCOUNTER — Encounter: Payer: Self-pay | Admitting: Cardiovascular Disease

## 2016-01-04 VITALS — BP 106/52 | HR 58 | Ht 59.0 in | Wt 90.5 lb

## 2016-01-04 DIAGNOSIS — I5032 Chronic diastolic (congestive) heart failure: Secondary | ICD-10-CM | POA: Diagnosis not present

## 2016-01-04 DIAGNOSIS — I080 Rheumatic disorders of both mitral and aortic valves: Secondary | ICD-10-CM

## 2016-01-04 DIAGNOSIS — I440 Atrioventricular block, first degree: Secondary | ICD-10-CM

## 2016-01-04 DIAGNOSIS — I059 Rheumatic mitral valve disease, unspecified: Secondary | ICD-10-CM

## 2016-01-04 MED ORDER — POTASSIUM CHLORIDE ER 10 MEQ PO TBCR: 10.0000 meq | EXTENDED_RELEASE_TABLET | Freq: Four times a day (QID) | ORAL | Status: DC

## 2016-01-04 MED ORDER — FUROSEMIDE 20 MG PO TABS: 20.0000 mg | ORAL_TABLET | Freq: Two times a day (BID) | ORAL | Status: DC

## 2016-01-04 NOTE — Assessment & Plan Note (Signed)
Moderate MR on echo Will continue on Lasix twice a day with high-dose potassium  appears relatively euvolemic,  BMP from last month reviewed, all potassium and renal function

## 2016-01-04 NOTE — Progress Notes (Signed)
Patient ID: Jennifer Skinner, female    DOB: 04/08/1931, 82 y.o.   MRN: [id number]  HPI Comments: 82 year old female with history of mitral valve prolapse and regurgitation, diastolic dysfunction, COPD, and GERD, hospital admission 10/10/2015 for acute respiratory distress with hypoxia and COPD exacerbation in the setting of right lower lobe PNA and acute on chronic diastolic CHF, who presents today for follow-up in the office for acute on chronic diastolic CHF  In general she reports that she is doing well. Denies any significant symptoms of shortness of breath She takes for potassium per day, Lasix twice a day Denies any significant cough, PND or orthopnea, no leg edema Active at baseline, no regular exercise program No prior smoking history  EKG on today's visit shows normal sinus rhythm with rate 58 bpm, no significant ST or T-wave changes  Other past medical history reviewed  10/07/2015, CXR showed RLL PNA superimposed on COPD/emphysema, small right parapneumonic effusion, stable cardiomegaly without evidence of pulmonary edema. CBC unremarkable. She was diagnosed with a right lower lobe pneumonia at that time and started on Levaquin with outpatient follow up. She returned to the ED on 10/31 with increased SOB and leg swelling x 3 days. She initially felt the LEE was 2/2 Levaquin and the urgent care changed her to doxycycline over the phone. She was found to have a BNP of 1528 upon her arrival to [facility].   Echo showed normal LV systolic funation at 123456, normal wall motion, GR1DD, mitral valve prolasp with moderate regurgitation, mild to moderate TR, PASP 56 mm Hg.      Allergies  Allergen Reactions  . Penicillins     Current Outpatient Prescriptions on File Prior to Visit  Medication Sig Dispense Refill  . albuterol (PROVENTIL) (2.5 MG/3ML) 0.083% nebulizer solution Take 3 mLs (2.5 mg total) by nebulization every 6 (six) hours as needed for wheezing or shortness of breath. 75 mL 12   . Apple Cider Vinegar (APPLE CIDER VINEGAR ULTRA) 600 MG CAPS 2 teaspoons once a day    . aspirin 325 MG tablet Take 325 mg by mouth daily.    Marland Kitchen gabapentin (NEURONTIN) 100 MG capsule Take 1 capsule (100 mg total) by mouth 3 (three) times daily. 90 capsule 3  . levothyroxine (SYNTHROID, LEVOTHROID) 25 MCG tablet Take 1 tablet (25 mcg total) by mouth daily before breakfast. Ran out of meds. 30 tablet 6  . metoprolol tartrate (LOPRESSOR) 25 MG tablet Take 0.5 tablets (12.5 mg total) by mouth 2 (two) times daily. 60 tablet 5  . Multiple Vitamin (MULTI-VITAMIN DAILY PO) Take by mouth daily.    [name] (OYSTER CALCIUM) 500 MG TABS Take 500 mg of elemental calcium by mouth 2 (two) times daily.    . polyethylene glycol powder (GLYCOLAX/MIRALAX) powder     . traMADol (ULTRAM) 50 MG tablet Take 1 tablet by mouth every 6 (six) hours as needed.      No current facility-administered medications on file prior to visit.    Past Medical History  Diagnosis Date  . Mitral prolapse   . Mitral regurgitation   . Gastro-esophageal reflux   . Osteoporosis   . Arthritis   . Chronic diastolic CHF (congestive heart failure) ([place])     a. echo 2014: EF 55-60%, nl wall motion, GR1DD, mild MR, moder mitral prolapse, PASP 40 mm Hg; b. echo 09/2015: EF 60-65%, nl wall motion, GR1DD, mod MR, mild to mod TR, PASP 56 mm Hg  . COPD (  chronic obstructive pulmonary disease) ([place])   . 1St degree AV block   . Degenerative disc disease, lumbar     Past Surgical History  Procedure Laterality Date  . Dilation and curettage of uterus    . Right eye       surgery  . Hysterotomy    . Right breast biopsy    . Tonsillectomy    . Abdominal hysterectomy    . Appendectomy      Social History  reports that she has never smoked. She has never used smokeless tobacco. She reports that she does not drink alcohol or use illicit drugs.  Family History family history includes CAD in her brother; CVA (age of onset: 6) in  her mother; Heart attack (age of onset: 63) in her father; Throat cancer in her sister.   Review of Systems  Constitutional: Negative.   Respiratory: Negative.   Cardiovascular: Negative.   Gastrointestinal: Negative.   Musculoskeletal: Negative.   Neurological: Negative.   Hematological: Negative.   Psychiatric/Behavioral: Negative.   All other systems reviewed and are negative.   BP 106/52 mmHg  Pulse 58  Ht 4\' 11"  (1.499 m)  Wt 90 lb 8 oz (41.051 kg)  BMI 18.27 kg/m2  Physical Exam  Constitutional: She is oriented to person, place, and time.  Very thin  HENT:  Head: Normocephalic.  Nose: Nose normal.  Mouth/Throat: Oropharynx is clear and moist.  Eyes: Conjunctivae are normal. Pupils are equal, round, and reactive to light.  Neck: Normal range of motion. Neck supple. No JVD present.  Cardiovascular: Normal rate, regular rhythm and intact distal pulses.  Exam reveals no gallop and no friction rub.   Murmur heard.  Systolic murmur is present with a grade of 2/6  Pulmonary/Chest: Effort normal and breath sounds normal. No respiratory distress. She has no wheezes. She has no rales. She exhibits no tenderness.  Abdominal: Soft. Bowel sounds are normal. She exhibits no distension. There is no tenderness.  Musculoskeletal: Normal range of motion. She exhibits no edema or tenderness.  Lymphadenopathy:    She has no cervical adenopathy.  Neurological: She is alert and oriented to person, place, and time. Coordination normal.  Skin: Skin is warm and dry. No rash noted. No erythema.  Psychiatric: She has a normal mood and affect. Her behavior is normal. Judgment and thought content normal.

## 2016-01-04 NOTE — Patient Instructions (Signed)
You are doing well. No medication changes were made.  Please call us if you have new issues that need to be addressed before your next appt.  Your physician wants you to follow-up in: 6 months.  You will receive a reminder letter in the mail two months in advance. If you don't receive a letter, please call our office to schedule the follow-up appointment.   

## 2016-01-04 NOTE — Assessment & Plan Note (Signed)
Appears euvolemic on today's visit, we'll need to monitor closely as she is on Lasix twice a day with high-dose potassium Denies having any symptoms concerning for CHF, clinical exam appears essentially benign

## 2016-01-09 ENCOUNTER — Other Ambulatory Visit: Payer: Self-pay | Admitting: Family Medicine

## 2016-01-10 DIAGNOSIS — D170 Benign lipomatous neoplasm of skin and subcutaneous tissue of head, face and neck: Secondary | ICD-10-CM | POA: Diagnosis not present

## 2016-02-02 DIAGNOSIS — L03019 Cellulitis of unspecified finger: Secondary | ICD-10-CM | POA: Diagnosis not present

## 2016-03-13 ENCOUNTER — Encounter: Payer: Self-pay | Admitting: Family Medicine

## 2016-03-13 ENCOUNTER — Ambulatory Visit (INDEPENDENT_AMBULATORY_CARE_PROVIDER_SITE_OTHER): Payer: PPO | Admitting: Family Medicine

## 2016-03-13 VITALS — BP 116/63 | HR 73 | Resp 16 | Ht 59.0 in | Wt 91.4 lb

## 2016-03-13 DIAGNOSIS — E038 Other specified hypothyroidism: Secondary | ICD-10-CM | POA: Diagnosis not present

## 2016-03-13 DIAGNOSIS — I5032 Chronic diastolic (congestive) heart failure: Secondary | ICD-10-CM | POA: Diagnosis not present

## 2016-03-13 DIAGNOSIS — J431 Panlobular emphysema: Secondary | ICD-10-CM | POA: Diagnosis not present

## 2016-03-13 DIAGNOSIS — E034 Atrophy of thyroid (acquired): Secondary | ICD-10-CM | POA: Diagnosis not present

## 2016-03-13 DIAGNOSIS — I059 Rheumatic mitral valve disease, unspecified: Secondary | ICD-10-CM | POA: Diagnosis not present

## 2016-03-13 DIAGNOSIS — M5136 Other intervertebral disc degeneration, lumbar region: Secondary | ICD-10-CM

## 2016-03-13 DIAGNOSIS — I10 Essential (primary) hypertension: Secondary | ICD-10-CM | POA: Insufficient documentation

## 2016-03-13 DIAGNOSIS — B070 Plantar wart: Secondary | ICD-10-CM

## 2016-03-13 MED ORDER — LEVOTHYROXINE SODIUM 25 MCG PO TABS: 25.0000 ug | ORAL_TABLET | Freq: Every day | ORAL | Status: DC

## 2016-03-13 MED ORDER — METOPROLOL TARTRATE 25 MG PO TABS: 12.5000 mg | ORAL_TABLET | Freq: Two times a day (BID) | ORAL | Status: DC

## 2016-03-13 MED ORDER — FUROSEMIDE 20 MG PO TABS: 20.0000 mg | ORAL_TABLET | Freq: Two times a day (BID) | ORAL | Status: DC

## 2016-03-13 NOTE — Progress Notes (Signed)
Name: Jennifer Skinner   MRN: [id number]    DOB: 1931-02-07   Date:03/13/2016       Progress Note  Subjective  Chief Complaint  Chief Complaint  Patient presents with  . Hypertension    HPI Here for f/u of BP, hypothyroidism.  Sees Dr. Rockey Situ re: CHF.  C/o foot callous of L foot.  No problem-specific assessment & plan notes found for this encounter.   Past Medical History  Diagnosis Date  . Mitral prolapse   . Mitral regurgitation   . Gastro-esophageal reflux   . Osteoporosis   . Arthritis   . Chronic diastolic CHF (congestive heart failure) ([place])     a. echo 2014: EF 55-60%, nl wall motion, GR1DD, mild MR, moder mitral prolapse, PASP 40 mm Hg; b. echo 09/2015: EF 60-65%, nl wall motion, GR1DD, mod MR, mild to mod TR, PASP 56 mm Hg  . COPD (chronic obstructive pulmonary disease) ([place])   . 1St degree AV block   . Degenerative disc disease, lumbar     Past Surgical History  Procedure Laterality Date  . Dilation and curettage of uterus    . Right eye       surgery  . Hysterotomy    . Right breast biopsy    . Tonsillectomy    . Abdominal hysterectomy    . Appendectomy      Family History  Problem Relation Age of Onset  . Heart attack Father 63  . Throat cancer Sister   . CAD Brother   . CVA Mother 46    Social History   Social History  . Marital Status: Married    Spouse Name: N/A  . Number of Children: N/A  . Years of Education: N/A   Occupational History  . Not on file.   Social History Main Topics  . Smoking status: Never Smoker   . Smokeless tobacco: Never Used  . Alcohol Use: No  . Drug Use: No  . Sexual Activity: Not on file   Other Topics Concern  . Not on file   Social History Narrative     Current outpatient prescriptions:  .  albuterol (PROVENTIL) (2.5 MG/3ML) 0.083% nebulizer solution, Take 3 mLs (2.5 mg total) by nebulization every 6 (six) hours as needed for wheezing or shortness of breath., Disp: 75 mL, Rfl: 12 .  Apple Cider  Vinegar (APPLE CIDER VINEGAR ULTRA) 600 MG CAPS, 2 teaspoons once a day, Disp: , Rfl:  .  aspirin 325 MG tablet, Take 325 mg by mouth daily., Disp: , Rfl:  .  furosemide (LASIX) 20 MG tablet, Take 1 tablet (20 mg total) by mouth 2 (two) times daily., Disp: 180 tablet, Rfl: 3 .  gabapentin (NEURONTIN) 100 MG capsule, Take 1 capsule (100 mg total) by mouth 3 (three) times daily., Disp: 90 capsule, Rfl: 1 .  levothyroxine (SYNTHROID, LEVOTHROID) 25 MCG tablet, Take 1 tablet (25 mcg total) by mouth daily before breakfast. Ran out of meds., Disp: 90 tablet, Rfl: 3 .  metoprolol tartrate (LOPRESSOR) 25 MG tablet, Take 0.5 tablets (12.5 mg total) by mouth 2 (two) times daily., Disp: 90 tablet, Rfl: 3 .  Multiple Vitamin (MULTI-VITAMIN DAILY PO), Take by mouth daily., Disp: , Rfl:  .  Oyster Shell (OYSTER CALCIUM) 500 MG TABS, Take 500 mg of elemental calcium by mouth 2 (two) times daily., Disp: , Rfl:  .  polyethylene glycol powder (GLYCOLAX/MIRALAX) powder, , Disp: , Rfl:  .  potassium chloride (K-DUR) 10 MEQ  tablet, Take 1 tablet (10 mEq total) by mouth 4 (four) times daily., Disp: 360 tablet, Rfl: 3 .  traMADol (ULTRAM) 50 MG tablet, Take 1 tablet by mouth every 6 (six) hours as needed. , Disp: , Rfl:   Allergies  Allergen Reactions  . Penicillins      Review of Systems  Musculoskeletal: Positive for joint pain (L foot pain).      Objective  Filed Vitals:   03/13/16 1049  BP: 116/63  Pulse: 73  Resp: 16  Height: 4\' 11"  (1.499 m)  Weight: 91 lb 6.4 oz (41.459 kg)    Physical Exam  Constitutional: She is oriented to person, place, and time and well-developed, well-nourished, and in no distress. No distress.  HENT:  Head: Normocephalic and atraumatic.  Eyes: Conjunctivae are normal. Pupils are equal, round, and reactive to light. No scleral icterus.  Neck: Normal range of motion. Neck supple. Carotid bruit is not present. No thyromegaly present.  Cardiovascular: Normal rate and  regular rhythm.   Murmur heard.  Systolic murmur is present with a grade of 3/6  apex  Pulmonary/Chest: Effort normal. No respiratory distress. She has no wheezes. She has no rales.  Musculoskeletal: She exhibits no edema.  Lymphadenopathy:    She has no cervical adenopathy.  Neurological: She is alert and oriented to person, place, and time.  Skin:  Plantar warts L foot.  Vitals reviewed.      No results found for this or any previous visit (from the past 2160 hour(s)).   Assessment & Plan  Problem List Items Addressed This Visit      Cardiovascular and Mediastinum   Mitral valve disorder   Relevant Medications   furosemide (LASIX) 20 MG tablet   metoprolol tartrate (LOPRESSOR) 25 MG tablet   Chronic diastolic CHF (congestive heart failure) (HCC)   Relevant Medications   furosemide (LASIX) 20 MG tablet   metoprolol tartrate (LOPRESSOR) 25 MG tablet   HBP (high blood pressure) - Primary   Relevant Medications   furosemide (LASIX) 20 MG tablet   metoprolol tartrate (LOPRESSOR) 25 MG tablet     Respiratory   COPD (chronic obstructive pulmonary disease) (HCC)     Endocrine   Hypothyroidism   Relevant Medications   levothyroxine (SYNTHROID, LEVOTHROID) 25 MCG tablet   metoprolol tartrate (LOPRESSOR) 25 MG tablet   Other Relevant Orders   TSH     Musculoskeletal and Integument   Degeneration of intervertebral disc of lumbar region    Other Visit Diagnoses    Plantar wart of left foot        Relevant Orders    Ambulatory referral to Podiatry       Meds ordered this encounter  Medications  . furosemide (LASIX) 20 MG tablet    Sig: Take 1 tablet (20 mg total) by mouth 2 (two) times daily.    Dispense:  180 tablet    Refill:  3  . levothyroxine (SYNTHROID, LEVOTHROID) 25 MCG tablet    Sig: Take 1 tablet (25 mcg total) by mouth daily before breakfast. Ran out of meds.    Dispense:  90 tablet    Refill:  3  . metoprolol tartrate (LOPRESSOR) 25 MG tablet     Sig: Take 0.5 tablets (12.5 mg total) by mouth 2 (two) times daily.    Dispense:  90 tablet    Refill:  3   1. Essential hypertension  - metoprolol tartrate (LOPRESSOR) 25 MG tablet; Take 0.5 tablets (12.5 mg total)  by mouth 2 (two) times daily.  Dispense: 90 tablet; Refill: 3  2. Chronic diastolic CHF (congestive heart failure) (HCC)  - furosemide (LASIX) 20 MG tablet; Take 1 tablet (20 mg total) by mouth 2 (two) times daily.  Dispense: 180 tablet; Refill: 3  3. Mitral valve disorder   4. Panlobular emphysema ([place])   5. Hypothyroidism due to acquired atrophy of thyroid  - levothyroxine (SYNTHROID, LEVOTHROID) 25 MCG tablet; Take 1 tablet (25 mcg total) by mouth daily before breakfast. Ran out of meds.  Dispense: 90 tablet; Refill: 3 - TSH  6. Degeneration of intervertebral disc of lumbar region   7. Plantar wart of left foot  - Ambulatory referral to Podiatry

## 2016-03-14 ENCOUNTER — Other Ambulatory Visit: Payer: Self-pay | Admitting: Family Medicine

## 2016-03-19 DIAGNOSIS — E034 Atrophy of thyroid (acquired): Secondary | ICD-10-CM | POA: Diagnosis not present

## 2016-03-19 DIAGNOSIS — E038 Other specified hypothyroidism: Secondary | ICD-10-CM | POA: Diagnosis not present

## 2016-03-20 ENCOUNTER — Ambulatory Visit: Payer: PPO | Admitting: Family

## 2016-03-20 LAB — TSH: TSH: 3.72 u[IU]/mL (ref 0.450–4.500)

## 2016-03-27 ENCOUNTER — Ambulatory Visit: Payer: PPO | Attending: Family | Admitting: Family

## 2016-03-27 ENCOUNTER — Encounter: Payer: Self-pay | Admitting: Family

## 2016-03-27 VITALS — BP 118/60 | HR 65 | Resp 20 | Ht <= 58 in | Wt 92.0 lb

## 2016-03-27 DIAGNOSIS — I340 Nonrheumatic mitral (valve) insufficiency: Secondary | ICD-10-CM | POA: Diagnosis not present

## 2016-03-27 DIAGNOSIS — K219 Gastro-esophageal reflux disease without esophagitis: Secondary | ICD-10-CM | POA: Diagnosis not present

## 2016-03-27 DIAGNOSIS — I10 Essential (primary) hypertension: Secondary | ICD-10-CM | POA: Diagnosis not present

## 2016-03-27 DIAGNOSIS — Z7982 Long term (current) use of aspirin: Secondary | ICD-10-CM | POA: Diagnosis not present

## 2016-03-27 DIAGNOSIS — I341 Nonrheumatic mitral (valve) prolapse: Secondary | ICD-10-CM | POA: Diagnosis not present

## 2016-03-27 DIAGNOSIS — Z823 Family history of stroke: Secondary | ICD-10-CM | POA: Insufficient documentation

## 2016-03-27 DIAGNOSIS — J449 Chronic obstructive pulmonary disease, unspecified: Secondary | ICD-10-CM | POA: Insufficient documentation

## 2016-03-27 DIAGNOSIS — Z79899 Other long term (current) drug therapy: Secondary | ICD-10-CM | POA: Diagnosis not present

## 2016-03-27 DIAGNOSIS — Z880 Allergy status to penicillin: Secondary | ICD-10-CM | POA: Insufficient documentation

## 2016-03-27 DIAGNOSIS — Z9889 Other specified postprocedural states: Secondary | ICD-10-CM | POA: Insufficient documentation

## 2016-03-27 DIAGNOSIS — Z8249 Family history of ischemic heart disease and other diseases of the circulatory system: Secondary | ICD-10-CM | POA: Insufficient documentation

## 2016-03-27 DIAGNOSIS — I5032 Chronic diastolic (congestive) heart failure: Secondary | ICD-10-CM | POA: Diagnosis not present

## 2016-03-27 DIAGNOSIS — M5136 Other intervertebral disc degeneration, lumbar region: Secondary | ICD-10-CM | POA: Insufficient documentation

## 2016-03-27 DIAGNOSIS — G8929 Other chronic pain: Secondary | ICD-10-CM | POA: Diagnosis not present

## 2016-03-27 NOTE — Patient Instructions (Signed)
Continue weighing daily and call for an overnight weight gain of > 2 pounds or a weekly weight gain of >5 pounds. 

## 2016-03-27 NOTE — Progress Notes (Signed)
Subjective:    Patient ID: Jennifer Skinner, female    DOB: [birth year], 82 y.o.   MRN: [id number]  Congestive Heart Failure Presents for follow-up visit. The disease course has been stable. Associated symptoms include fatigue ("back gets tired"). Pertinent negatives include no abdominal pain, chest pain, edema, orthopnea, palpitations or shortness of breath. The symptoms have been stable. Past treatments include beta blockers and salt and fluid restriction. The treatment provided moderate relief. Compliance with prior treatments has been good. Her past medical history is significant for chronic lung disease, HTN and valvular heart disease. There is no history of CVA or DM. She has one 1st degree relative with heart disease.  Hypertension This is a chronic problem. The current episode started more than 1 year ago. The problem is unchanged. The problem is controlled. Associated symptoms include neck pain. Pertinent negatives include no chest pain, headaches, palpitations, peripheral edema or shortness of breath. Agents associated with hypertension include thyroid hormones. Risk factors for coronary artery disease include family history and post-menopausal state. Past treatments include beta blockers, diuretics and lifestyle changes. The current treatment provides moderate improvement. Compliance problems include exercise.  Hypertensive end-organ damage includes heart failure.    Past Medical History  Diagnosis Date  . Mitral prolapse   . Mitral regurgitation   . Gastro-esophageal reflux   . Osteoporosis   . Arthritis   . Chronic diastolic CHF (congestive heart failure) ([place])     a. echo 2014: EF 55-60%, nl wall motion, GR1DD, mild MR, moder mitral prolapse, PASP 40 mm Hg; b. echo 09/2015: EF 60-65%, nl wall motion, GR1DD, mod MR, mild to mod TR, PASP 56 mm Hg  . COPD (chronic obstructive pulmonary disease) ([place])   . 1St degree AV block   . Degenerative disc disease, lumbar     Past Surgical  History  Procedure Laterality Date  . Dilation and curettage of uterus    . Right eye       surgery  . Hysterotomy    . Right breast biopsy    . Tonsillectomy    . Abdominal hysterectomy    . Appendectomy      Family History  Problem Relation Age of Onset  . Heart attack Father 86  . Throat cancer Sister   . CAD Brother   . CVA Mother 70    Social History  Substance Use Topics  . Smoking status: Never Smoker   . Smokeless tobacco: Never Used  . Alcohol Use: No    Allergies  Allergen Reactions  . Penicillins     Prior to Admission medications   Medication Sig Start Date End Date Taking? Authorizing Provider  albuterol (PROVENTIL) (2.5 MG/3ML) 0.083% nebulizer solution Take 3 mLs (2.5 mg total) by nebulization every 6 (six) hours as needed for wheezing or shortness of breath. 10/12/15  Yes Epifanio Lesches, MD  Apple Cider Vinegar (APPLE CIDER VINEGAR ULTRA) 600 MG CAPS 2 teaspoons once a day   Yes Historical Provider, MD  aspirin 325 MG tablet Take 325 mg by mouth daily.   Yes Historical Provider, MD  furosemide (LASIX) 20 MG tablet Take 1 tablet (20 mg total) by mouth 2 (two) times daily. 03/13/16  Yes Arlis Porta., MD  gabapentin (NEURONTIN) 100 MG capsule Take 1 capsule (100 mg total) by mouth 3 (three) times daily. 03/14/16  Yes Arlis Porta., MD  levothyroxine (SYNTHROID, LEVOTHROID) 25 MCG tablet Take 1 tablet (25 mcg total) by mouth daily before  breakfast. Ran out of meds. 03/13/16  Yes Arlis Porta., MD  metoprolol tartrate (LOPRESSOR) 25 MG tablet Take 0.5 tablets (12.5 mg total) by mouth 2 (two) times daily. 03/13/16  Yes Arlis Porta., MD  Multiple Vitamin (MULTI-VITAMIN DAILY PO) Take by mouth daily.   Yes Historical Provider, MD  [name] (OYSTER CALCIUM) 500 MG TABS Take 500 mg of elemental calcium by mouth 2 (two) times daily.   Yes Historical Provider, MD  polyethylene glycol powder (GLYCOLAX/MIRALAX) powder  10/13/15  Yes Historical  Provider, MD  potassium chloride (K-DUR) 10 MEQ tablet Take 1 tablet (10 mEq total) by mouth 4 (four) times daily. 01/04/16  Yes Minna Merritts, MD  traMADol (ULTRAM) 50 MG tablet Take 1 tablet by mouth every 6 (six) hours as needed.  08/23/14  Yes Historical Provider, MD     Review of Systems  Constitutional: Positive for fatigue ("back gets tired"). Negative for appetite change.  HENT: Positive for postnasal drip. Negative for congestion and sore throat.   Eyes: Negative.   Respiratory: Negative for cough, chest tightness and shortness of breath.   Cardiovascular: Negative for chest pain, palpitations and leg swelling.  Gastrointestinal: Negative for abdominal pain and abdominal distention.  Endocrine: Negative.   Genitourinary: Negative.   Musculoskeletal: Positive for back pain (chronic), neck pain and neck stiffness.  Skin: Negative.   Allergic/Immunologic: Negative.   Neurological: Positive for dizziness. Negative for light-headedness and headaches.  Hematological: Negative for adenopathy. Does not bruise/bleed easily.  Psychiatric/Behavioral: Negative for sleep disturbance (sleeping on 1 pillow) and dysphoric mood. The patient is not nervous/anxious.        Objective:   Physical Exam  Constitutional: She is oriented to person, place, and time. She appears well-developed and well-nourished.  HENT:  Head: Normocephalic and atraumatic.  Eyes: Conjunctivae are normal. Pupils are equal, round, and reactive to light.  Neck: Normal range of motion. Neck supple.  Cardiovascular: Normal rate and regular rhythm.   Pulmonary/Chest: Effort normal. She has no wheezes. She has no rales.  Abdominal: Soft. She exhibits no distension. There is no tenderness.  Musculoskeletal: She exhibits no edema or tenderness.  Neurological: She is alert and oriented to person, place, and time.  Skin: Skin is warm and dry.  Psychiatric: She has a normal mood and affect. Her behavior is normal. Thought  content normal.  Nursing note and vitals reviewed.  BP 118/60 mmHg  Pulse 65  Resp 20  Ht 4\' 9"  (1.448 m)  Wt 92 lb (41.731 kg)  BMI 19.90 kg/m2  SpO2 94%        Assessment & Plan:  1: Chronic heart failure with preserved ejection fraction- Patient presents with fatigue upon exertion but she feels like it's more related to her back getting tired. She does rest often during the day when she gets tired. She denies any shortness of breath or swelling in her legs or abdomen. She continues to weigh herself daily and says that her weight has been stable. By our scale, her weight is unchanged from the last time she was here. Reminded to call for an overnight weight gain of >2 pounds or a weekly weight gain of >5 pounds. She is not adding any salt to her food and tries to eat low sodium foods. She has seen her cardiologist since she was here last.  2: HTN- Blood pressure high on initial reading but when rechecked with a manual cuff, it was normal. Continue medications at this  time. 3: Degenerative disc disease- She describes chronic pain in her lower back with "terrible" stiffness in the morning when she wakes up. Once she gets up and takes her medication and gets moving around, the stiffness improves. Does have tramadol that she can take for the pain and carries a pillow with her for behind her back.    Patient did not bring her medications nor a list. Each medication was verbally reviewed with the patient and she was encouraged to bring the bottles to every visit to confirm accuracy of list.  Return here in 3 months or sooner for any questions/problems before then.

## 2016-04-11 ENCOUNTER — Other Ambulatory Visit: Payer: Self-pay | Admitting: Family Medicine

## 2016-05-24 DIAGNOSIS — D0471 Carcinoma in situ of skin of right lower limb, including hip: Secondary | ICD-10-CM | POA: Diagnosis not present

## 2016-05-24 DIAGNOSIS — C44519 Basal cell carcinoma of skin of other part of trunk: Secondary | ICD-10-CM | POA: Diagnosis not present

## 2016-05-24 DIAGNOSIS — L821 Other seborrheic keratosis: Secondary | ICD-10-CM | POA: Diagnosis not present

## 2016-05-29 DIAGNOSIS — M5416 Radiculopathy, lumbar region: Secondary | ICD-10-CM | POA: Diagnosis not present

## 2016-05-29 DIAGNOSIS — M4806 Spinal stenosis, lumbar region: Secondary | ICD-10-CM | POA: Diagnosis not present

## 2016-05-29 DIAGNOSIS — M5136 Other intervertebral disc degeneration, lumbar region: Secondary | ICD-10-CM | POA: Diagnosis not present

## 2016-06-26 ENCOUNTER — Ambulatory Visit: Payer: PPO | Admitting: Family

## 2016-07-10 ENCOUNTER — Ambulatory Visit: Payer: PPO | Admitting: Cardiovascular Disease

## 2016-07-10 DIAGNOSIS — R0989 Other specified symptoms and signs involving the circulatory and respiratory systems: Secondary | ICD-10-CM

## 2016-07-17 ENCOUNTER — Ambulatory Visit: Payer: PPO | Admitting: Family

## 2016-07-26 IMAGING — CR DG CHEST 2V
4 series · 4 of 4 positions shown · non-contrast
Comparison: 12/07/2013.

CLINICAL DATA: Acute onset shortness of breath 2 days ago. 1 day
history of generalized weakness.

EXAM:
CHEST  2 VIEW

[chest pa]
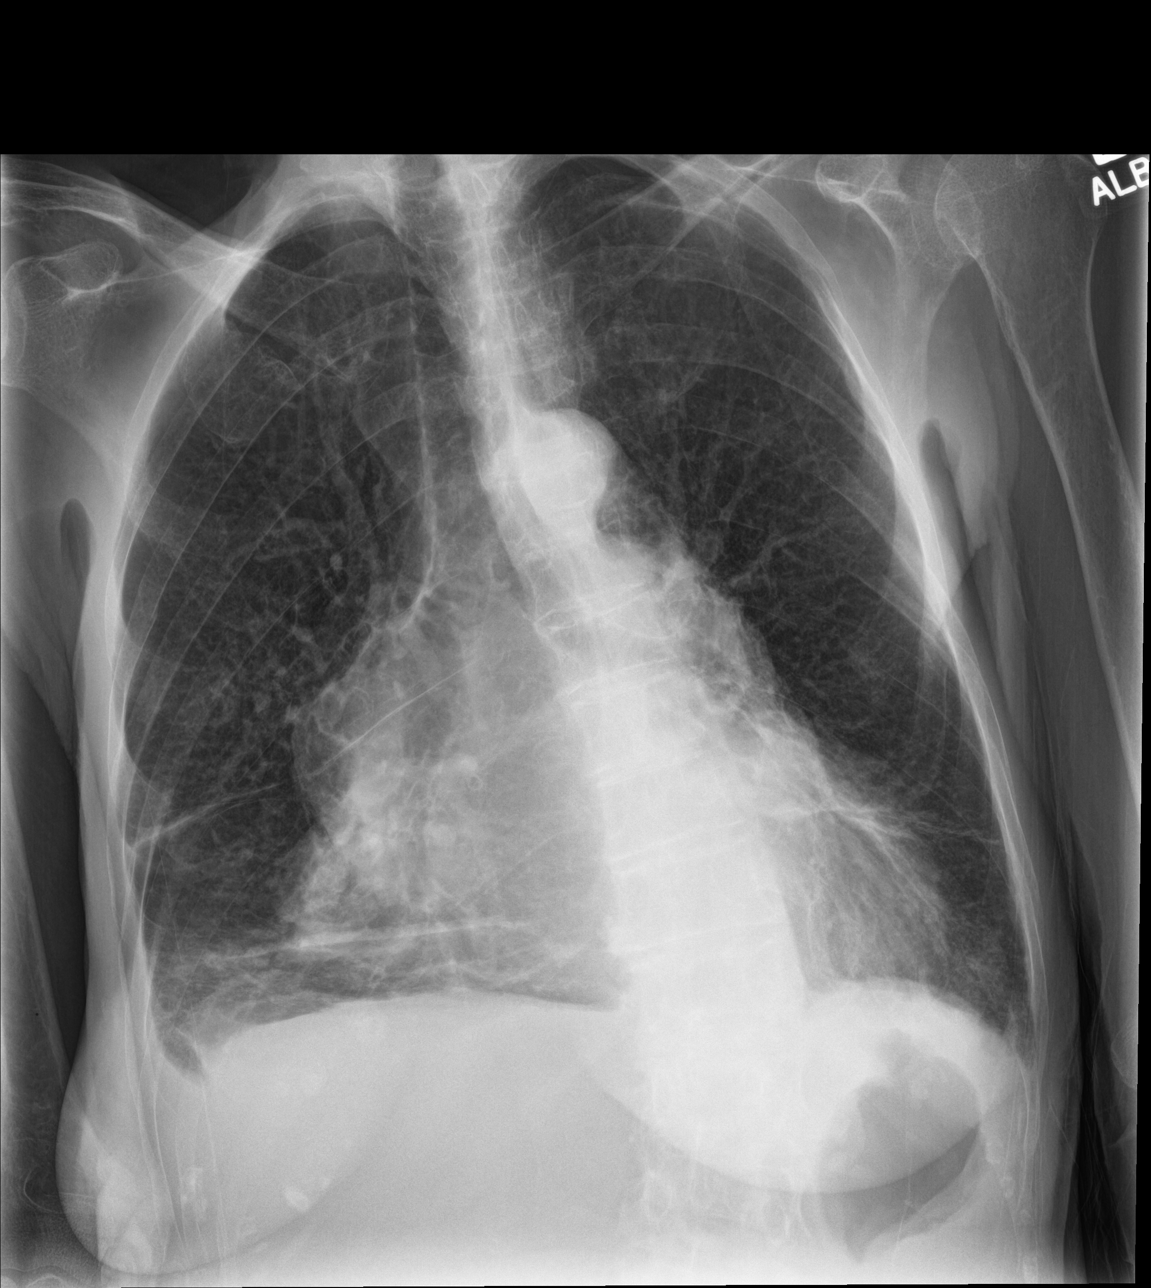

[chest ap]
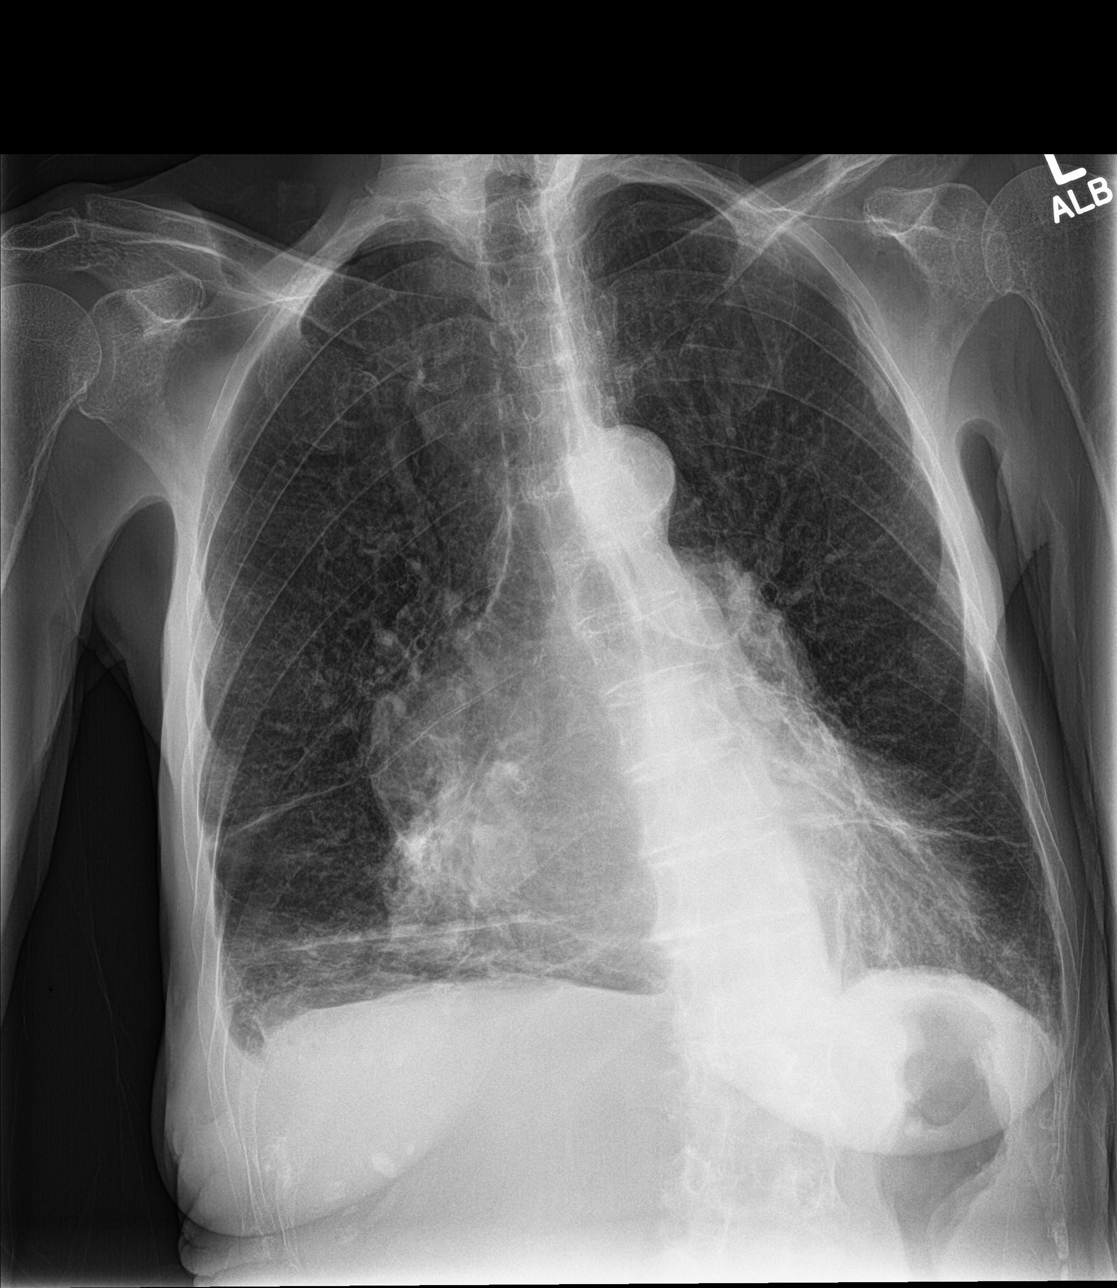

[chest lat (1 of 2)]
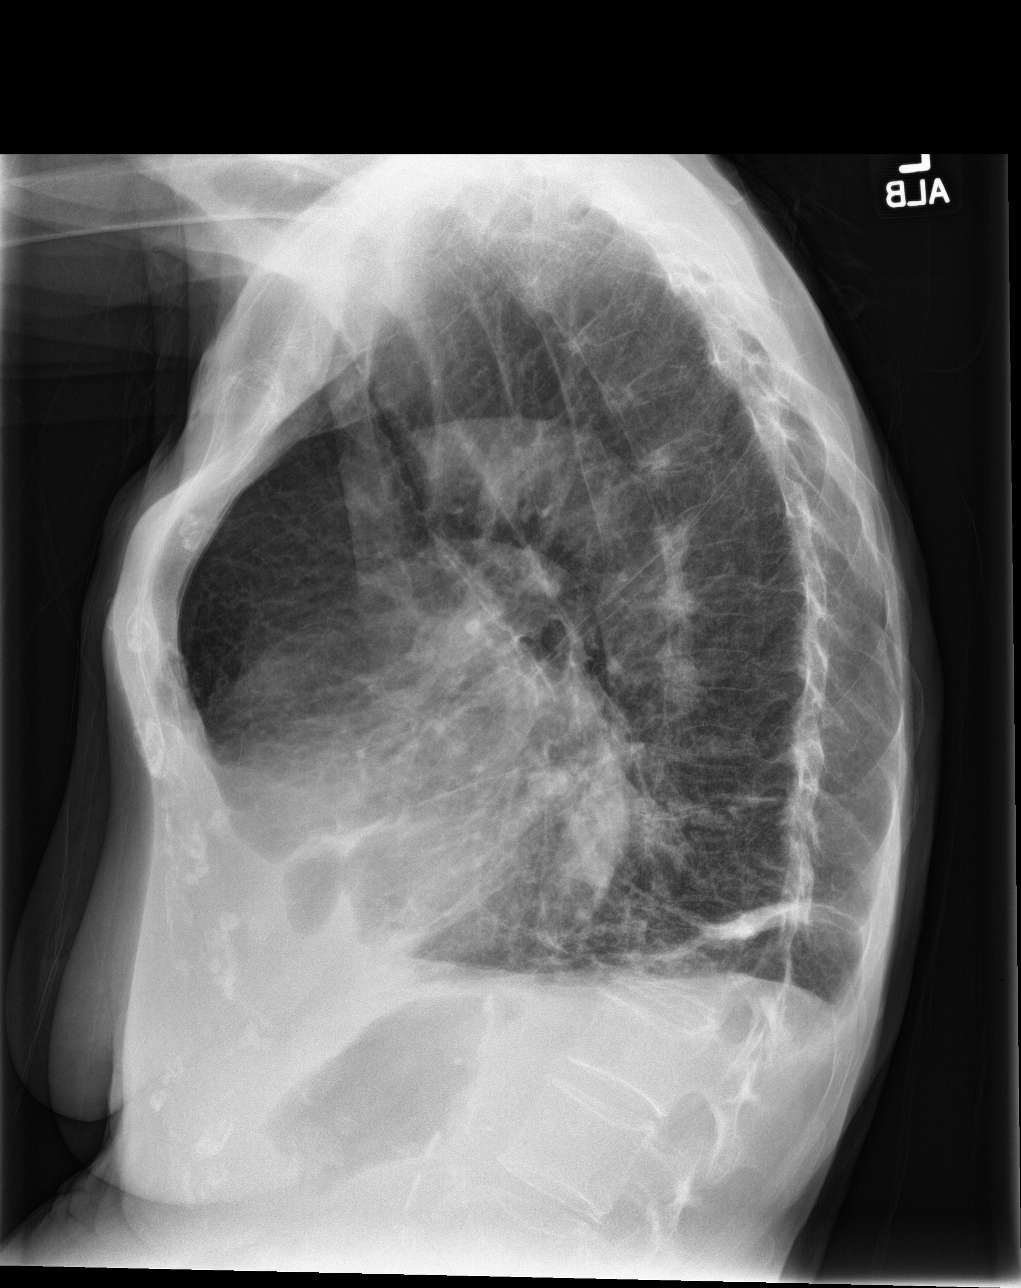

[chest lat (2 of 2)]
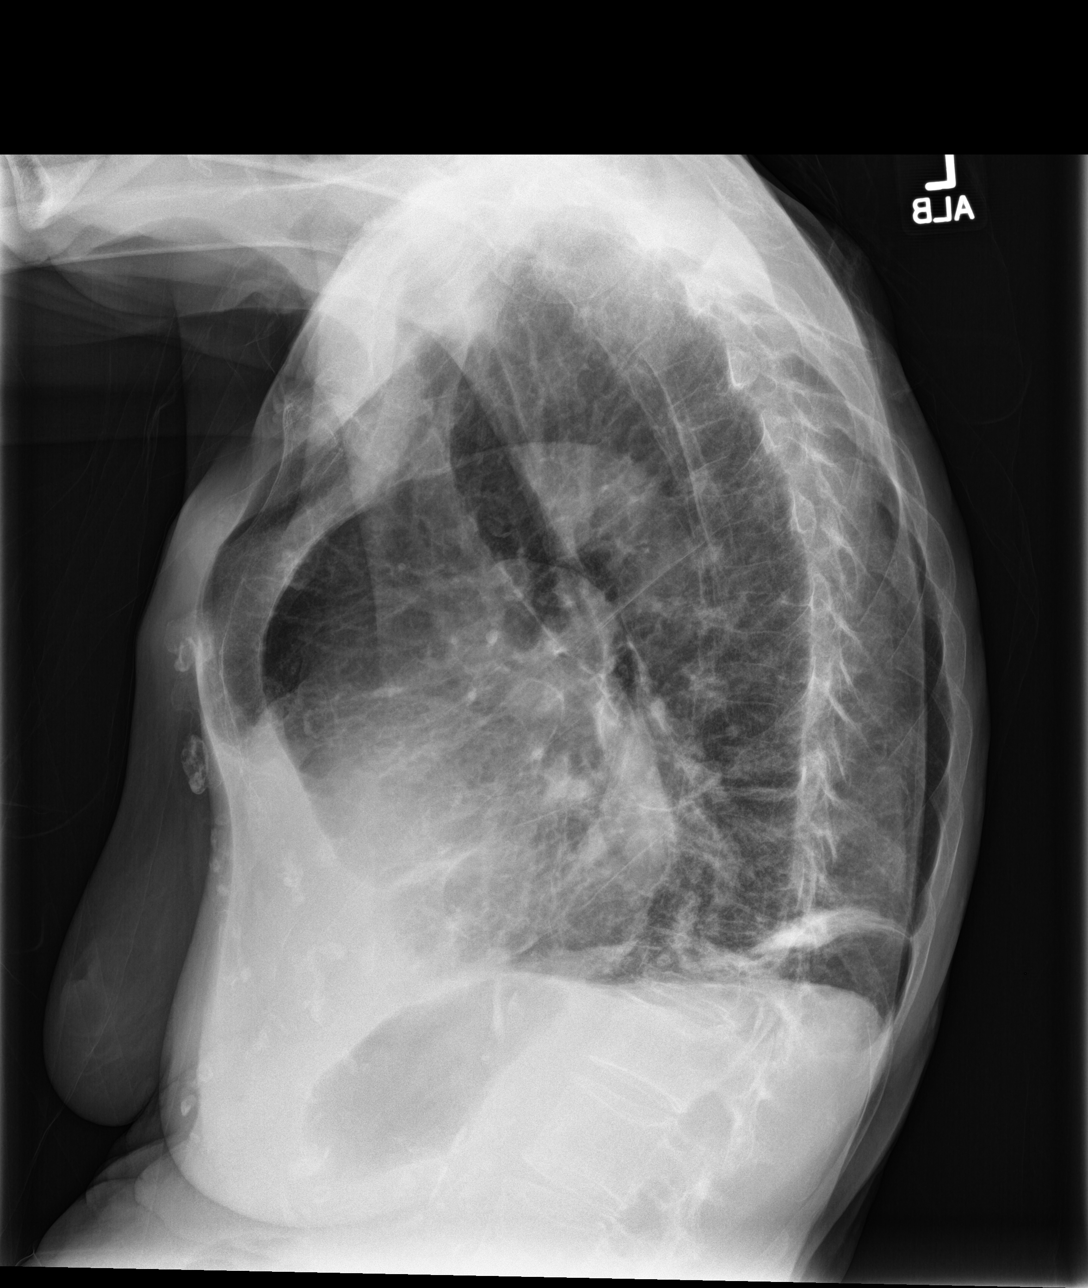

[4 of 4 positions shown; findings below may reference images not displayed]

FINDINGS: Cardiac silhouette markedly enlarged, unchanged. Thoracic aorta
tortuous and atherosclerotic, unchanged. Markedly enlarged central
pulmonary arteries, particularly on the right, unchanged. Linear
scarring in both lung bases localizing to the lower lobes, right
middle lobe and lingula. New patchy airspace opacities in the right
lower lobe. Hyperinflation, emphysematous changes throughout both
lungs, and mild central peribronchial thickening, unchanged. Small
right pleural effusion. No left pleural effusion. No pneumothorax.
Severe osseous demineralization. Thoracolumbar scoliosis convex
left.
IMPRESSION: 1. Right lower lobe pneumonia superimposed upon COPD/emphysema.
Small right parapneumonic effusion.
2. Stable linear scarring in the lower lobes, right middle lobe and
lingula.
3. Stable marked cardiomegaly without evidence pulmonary edema.
4. Stable markedly enlarged central pulmonary arteries, right larger
than left.

## 2016-08-21 ENCOUNTER — Ambulatory Visit: Payer: PPO | Admitting: Family

## 2016-09-04 ENCOUNTER — Encounter: Payer: Self-pay | Admitting: Family

## 2016-09-04 ENCOUNTER — Ambulatory Visit: Payer: PPO | Attending: Family | Admitting: Family

## 2016-09-04 VITALS — BP 136/56 | HR 68 | Resp 18 | Ht <= 58 in | Wt 91.0 lb

## 2016-09-04 DIAGNOSIS — J431 Panlobular emphysema: Secondary | ICD-10-CM

## 2016-09-04 DIAGNOSIS — Z8249 Family history of ischemic heart disease and other diseases of the circulatory system: Secondary | ICD-10-CM | POA: Insufficient documentation

## 2016-09-04 DIAGNOSIS — K219 Gastro-esophageal reflux disease without esophagitis: Secondary | ICD-10-CM | POA: Diagnosis not present

## 2016-09-04 DIAGNOSIS — I5032 Chronic diastolic (congestive) heart failure: Secondary | ICD-10-CM | POA: Diagnosis not present

## 2016-09-04 DIAGNOSIS — I110 Hypertensive heart disease with heart failure: Secondary | ICD-10-CM | POA: Insufficient documentation

## 2016-09-04 DIAGNOSIS — J449 Chronic obstructive pulmonary disease, unspecified: Secondary | ICD-10-CM | POA: Insufficient documentation

## 2016-09-04 DIAGNOSIS — M1990 Unspecified osteoarthritis, unspecified site: Secondary | ICD-10-CM | POA: Diagnosis not present

## 2016-09-04 DIAGNOSIS — I341 Nonrheumatic mitral (valve) prolapse: Secondary | ICD-10-CM | POA: Diagnosis not present

## 2016-09-04 DIAGNOSIS — I340 Nonrheumatic mitral (valve) insufficiency: Secondary | ICD-10-CM | POA: Diagnosis not present

## 2016-09-04 DIAGNOSIS — I10 Essential (primary) hypertension: Secondary | ICD-10-CM

## 2016-09-04 NOTE — Progress Notes (Signed)
Patient ID: Jennifer Skinner, female    DOB: 1931-10-22, 82 y.o.   MRN: [id number]  HPI  [name] is a 82 y/o female who has a history of MV prolapse, COPD, HTN, GERD and HF with an EF of 60-65% who returns for follow-up today.   Last echo done 10/11/15 with an EF of 60-65% with moderate mitral regurgitation but without aortic stenosis. Mild to moderate TR,  PASP 56mg  Hg.  Last hospital admission was Oct 2016 for pneumonia and acute HF. Was treated with antibiotics and diuresed.  Returns today for follow-up with fatigue upon moderate exertion. Does not have to stop often when shopping. Denies shortness of breath or edema. Biggest issue is back pain when she walks which is due to her DDD. Not adding salt to her food. Saw her cardiologist Rockey Situ) on 01/04/16.  Past Medical History:  Diagnosis Date  . 1St degree AV block   . Arthritis   . Chronic diastolic CHF (congestive heart failure) ([place])    a. echo 2014: EF 55-60%, nl wall motion, GR1DD, mild MR, moder mitral prolapse, PASP 40 mm Hg; b. echo 09/2015: EF 60-65%, nl wall motion, GR1DD, mod MR, mild to mod TR, PASP 56 mm Hg  . COPD (chronic obstructive pulmonary disease) ([place])   . Degenerative disc disease, lumbar   . Gastro-esophageal reflux   . Mitral prolapse   . Mitral regurgitation   . Osteoporosis     Past Surgical History:  Procedure Laterality Date  . ABDOMINAL HYSTERECTOMY    . APPENDECTOMY    . DILATION AND CURETTAGE OF UTERUS    . HYSTEROTOMY    . right breast biopsy    . right eye      surgery  . TONSILLECTOMY      Family History  Problem Relation Age of Onset  . Heart attack Father 49  . Throat cancer Sister   . CAD Brother   . CVA Mother 69    Social History  Substance Use Topics  . Smoking status: Never Smoker  . Smokeless tobacco: Never Used  . Alcohol use No    Allergies  Allergen Reactions  . Penicillins     Prior to Admission medications   Medication Sig Start Date End Date Taking?  Authorizing Provider  albuterol (PROVENTIL) (2.5 MG/3ML) 0.083% nebulizer solution Take 3 mLs (2.5 mg total) by nebulization every 6 (six) hours as needed for wheezing or shortness of breath. 10/12/15  Yes Epifanio Lesches, MD  Apple Cider Vinegar (APPLE CIDER VINEGAR ULTRA) 600 MG CAPS 2 teaspoons once a day   Yes Historical Provider, MD  aspirin 325 MG tablet Take 325 mg by mouth daily.   Yes Historical Provider, MD  furosemide (LASIX) 20 MG tablet Take 1 tablet (20 mg total) by mouth 2 (two) times daily. 03/13/16  Yes Arlis Porta., MD  gabapentin (NEURONTIN) 100 MG capsule Take 1 capsule (100 mg total) by mouth 3 (three) times daily. 04/12/16  Yes Arlis Porta., MD  levothyroxine (SYNTHROID, LEVOTHROID) 25 MCG tablet Take 1 tablet (25 mcg total) by mouth daily before breakfast. Ran out of meds. 03/13/16  Yes Arlis Porta., MD  metoprolol tartrate (LOPRESSOR) 25 MG tablet Take 0.5 tablets (12.5 mg total) by mouth 2 (two) times daily. 03/13/16  Yes Arlis Porta., MD  Multiple Vitamin (MULTI-VITAMIN DAILY PO) Take by mouth daily.   Yes Historical Provider, MD  [name] (OYSTER CALCIUM) 500 MG TABS Take  500 mg of elemental calcium by mouth 2 (two) times daily.   Yes Historical Provider, MD  polyethylene glycol powder (GLYCOLAX/MIRALAX) powder  10/13/15  Yes Historical Provider, MD  potassium chloride (K-DUR) 10 MEQ tablet Take 1 tablet (10 mEq total) by mouth 4 (four) times daily. 01/04/16  Yes Minna Merritts, MD  traMADol (ULTRAM) 50 MG tablet Take 1 tablet by mouth every 6 (six) hours as needed.  08/23/14  Yes Historical Provider, MD     Review of Systems  Constitutional: Positive for fatigue. Negative for appetite change.  HENT: Negative for congestion, postnasal drip and sore throat.   Eyes: Negative.   Respiratory: Negative for cough, chest tightness and shortness of breath.   Cardiovascular: Negative for chest pain, palpitations and leg swelling.  Gastrointestinal:  Negative for abdominal distention and abdominal pain.  Endocrine: Negative.   Genitourinary: Negative.   Musculoskeletal: Positive for back pain (only when walking). Negative for neck pain.  Skin: Negative.   Allergic/Immunologic: Negative.   Neurological: Positive for light-headedness. Negative for dizziness.  Hematological: Negative for adenopathy. Does not bruise/bleed easily.  Psychiatric/Behavioral: Negative for dysphoric mood and sleep disturbance (sleeping on 1 pillow). The patient is not nervous/anxious.    Vitals:   09/04/16 1032  BP: (!) 136/56  Pulse: 68  Resp: 18  SpO2: 95%  Weight: 91 lb (41.3 kg)  Height: 4\' 9"  (1.448 m)      Physical Exam  Constitutional: She is oriented to person, place, and time. She appears well-developed and well-nourished.  HENT:  Head: Normocephalic and atraumatic.  Eyes: Conjunctivae are normal. Pupils are equal, round, and reactive to light.  Neck: Normal range of motion. Neck supple.  Cardiovascular: Normal rate and regular rhythm.   Murmur heard.  Systolic murmur is present with a grade of 2/6  Pulmonary/Chest: Effort normal. She has no wheezes. She has no rales.  Abdominal: Soft. She exhibits no distension. There is no tenderness.  Musculoskeletal: She exhibits no edema or tenderness.  Neurological: She is alert and oriented to person, place, and time.  Skin: Skin is warm and dry.  Psychiatric: She has a normal mood and affect. Her behavior is normal. Thought content normal.  Nursing note and vitals reviewed.    Assessment & Plan:  1: Chronic heart failure with preserved ejection fraction- - NYHA Class II - Volume status stable, lungs clear/no edema - Not adding salt to her food/reading food labels some - Continues to weigh daily. Reminded to call for an overnight weight gain of >2 pounds or weekly weight gain of >5 pounds. - Continue furosemide/potassium twice daily - Sees cardiologist Rockey Situ) on 09/11/16  2: HTN- - BP  looks good today - Continue lopressor 12.5mg  twice daily - Sees PCP on 09/25/16  3: COPD- - Stable at this time - Continues with nebulizer as needed  Return here in 6 months or sooner for any questions/problems before then.

## 2016-09-04 NOTE — Patient Instructions (Signed)
Continue weighing daily and call for an overnight weight gain of > 2 pounds or a weekly weight gain of >5 pounds. 

## 2016-09-11 ENCOUNTER — Ambulatory Visit: Payer: PPO | Admitting: Cardiovascular Disease

## 2016-09-18 ENCOUNTER — Ambulatory Visit: Payer: PPO | Admitting: Family Medicine

## 2016-09-25 ENCOUNTER — Encounter: Payer: Self-pay | Admitting: Family Medicine

## 2016-09-25 ENCOUNTER — Ambulatory Visit (INDEPENDENT_AMBULATORY_CARE_PROVIDER_SITE_OTHER): Payer: PPO | Admitting: Family Medicine

## 2016-09-25 VITALS — BP 122/61 | HR 61 | Temp 97.9°F | Resp 16 | Ht 59.0 in | Wt 91.8 lb

## 2016-09-25 DIAGNOSIS — I10 Essential (primary) hypertension: Secondary | ICD-10-CM

## 2016-09-25 DIAGNOSIS — M47817 Spondylosis without myelopathy or radiculopathy, lumbosacral region: Secondary | ICD-10-CM

## 2016-09-25 DIAGNOSIS — I5032 Chronic diastolic (congestive) heart failure: Secondary | ICD-10-CM | POA: Diagnosis not present

## 2016-09-25 DIAGNOSIS — J410 Simple chronic bronchitis: Secondary | ICD-10-CM

## 2016-09-25 DIAGNOSIS — E034 Atrophy of thyroid (acquired): Secondary | ICD-10-CM | POA: Diagnosis not present

## 2016-09-25 DIAGNOSIS — M5136 Other intervertebral disc degeneration, lumbar region: Secondary | ICD-10-CM

## 2016-09-25 DIAGNOSIS — Z23 Encounter for immunization: Secondary | ICD-10-CM

## 2016-09-25 DIAGNOSIS — I080 Rheumatic disorders of both mitral and aortic valves: Secondary | ICD-10-CM

## 2016-09-25 NOTE — Progress Notes (Signed)
Name: Jennifer Skinner   MRN: [id number]    DOB: [birth year]   Date:09/25/2016       Progress Note  Subjective  Chief Complaint  Chief Complaint  Patient presents with  . Hypertension  . Hypothyroidism    HPI Here for f/u of HBP.   Has COPD and CHF that are stab le.  Hypothyroid with replacement.  Feeling well overall.  She has some problems with her gait due to her back arthritis.  No problem-specific Assessment & Plan notes found for this encounter.   Past Medical History:  Diagnosis Date  . 1st degree AV block   . Arthritis   . Chronic diastolic CHF (congestive heart failure) ([place])    a. echo 2014: EF 55-60%, nl wall motion, GR1DD, mild MR, moder mitral prolapse, PASP 40 mm Hg; b. echo 09/2015: EF 60-65%, nl wall motion, GR1DD, mod MR, mild to mod TR, PASP 56 mm Hg  . COPD (chronic obstructive pulmonary disease) ([place])   . Degenerative disc disease, lumbar   . Gastro-esophageal reflux   . Mitral prolapse   . Mitral regurgitation   . Osteoporosis     Past Surgical History:  Procedure Laterality Date  . ABDOMINAL HYSTERECTOMY    . APPENDECTOMY    . DILATION AND CURETTAGE OF UTERUS    . HYSTEROTOMY    . right breast biopsy    . right eye      surgery  . TONSILLECTOMY      Family History  Problem Relation Age of Onset  . Heart attack Father 65  . Throat cancer Sister   . CAD Brother   . CVA Mother 23    Social History   Social History  . Marital status: Married    Spouse name: N/A  . Number of children: N/A  . Years of education: N/A   Occupational History  . Not on file.   Social History Main Topics  . Smoking status: Never Smoker  . Smokeless tobacco: Never Used  . Alcohol use No  . Drug use: No  . Sexual activity: Not on file   Other Topics Concern  . Not on file   Social History Narrative  . No narrative on file     Current Outpatient Prescriptions:  .  albuterol (PROVENTIL) (2.5 MG/3ML) 0.083% nebulizer solution, Take 3 mLs (2.5 mg  total) by nebulization every 6 (six) hours as needed for wheezing or shortness of breath., Disp: 75 mL, Rfl: 12 .  Apple Cider Vinegar (APPLE CIDER VINEGAR ULTRA) 600 MG CAPS, 2 teaspoons once a day, Disp: , Rfl:  .  aspirin 325 MG tablet, Take 325 mg by mouth daily., Disp: , Rfl:  .  furosemide (LASIX) 20 MG tablet, Take 1 tablet (20 mg total) by mouth 2 (two) times daily., Disp: 180 tablet, Rfl: 3 .  gabapentin (NEURONTIN) 100 MG capsule, Take 1 capsule (100 mg total) by mouth 3 (three) times daily., Disp: 90 capsule, Rfl: 6 .  levothyroxine (SYNTHROID, LEVOTHROID) 25 MCG tablet, Take 1 tablet (25 mcg total) by mouth daily before breakfast. Ran out of meds., Disp: 90 tablet, Rfl: 3 .  metoprolol tartrate (LOPRESSOR) 25 MG tablet, Take 0.5 tablets (12.5 mg total) by mouth 2 (two) times daily., Disp: 90 tablet, Rfl: 3 .  Multiple Vitamin (MULTI-VITAMIN DAILY PO), Take by mouth daily., Disp: , Rfl:  .  Oyster Shell (OYSTER CALCIUM) 500 MG TABS, Take 500 mg of elemental calcium by mouth 2 (two) times daily.,  Disp: , Rfl:  .  polyethylene glycol powder (GLYCOLAX/MIRALAX) powder, , Disp: , Rfl:  .  potassium chloride (K-DUR) 10 MEQ tablet, Take 1 tablet (10 mEq total) by mouth 4 (four) times daily., Disp: 360 tablet, Rfl: 3 .  traMADol (ULTRAM) 50 MG tablet, Take 1 tablet by mouth every 6 (six) hours as needed. , Disp: , Rfl:   Allergies  Allergen Reactions  . Penicillins      Review of Systems  Constitutional: Negative for chills, fever, malaise/fatigue and weight loss.  HENT: Negative for hearing loss.   Eyes: Negative for blurred vision and double vision.  Respiratory: Negative for cough, shortness of breath and wheezing.   Cardiovascular: Negative for chest pain, palpitations and leg swelling.  Gastrointestinal: Negative for abdominal pain, blood in stool and heartburn.  Genitourinary: Negative for dysuria, frequency and urgency.  Musculoskeletal: Negative for back pain and joint pain.   Skin: Negative for rash.  Neurological: Negative for dizziness, tremors, weakness and headaches.      Objective  Vitals:   09/25/16 0957  BP: 122/61  Pulse: 61  Resp: 16  Temp: 97.9 F (36.6 C)  TempSrc: Oral  Weight: 41.6 kg (91 lb 12.8 oz)  Height: 4\' 11"  (1.499 m)    Physical Exam  Constitutional: She is oriented to person, place, and time and well-developed, well-nourished, and in no distress. No distress.  HENT:  Head: Normocephalic and atraumatic.  Eyes: Conjunctivae are normal. Pupils are equal, round, and reactive to light. No scleral icterus.  Neck: Normal range of motion. Neck supple. Carotid bruit is not present. No thyromegaly present.  Cardiovascular: Normal rate and regular rhythm.  Exam reveals no gallop and no friction rub.   Murmur heard.  Systolic murmur is present with a grade of 2/6  throughout  Pulmonary/Chest: Effort normal and breath sounds normal. No respiratory distress. She has no wheezes. She has no rales.  Musculoskeletal: She exhibits no edema.  Lymphadenopathy:    She has no cervical adenopathy.  Neurological: She is alert and oriented to person, place, and time.  Vitals reviewed.      No results found for this or any previous visit (from the past 2160 hour(s)).   Assessment & Plan  Problem List Items Addressed This Visit      Cardiovascular and Mediastinum   MITRAL REGURGITATION   Chronic diastolic CHF (congestive heart failure) (HCC)   HBP (high blood pressure)     Respiratory   COPD (chronic obstructive pulmonary disease) (HCC)     Endocrine   Hypothyroidism     Musculoskeletal and Integument   Lumbar and sacral osteoarthritis   Degeneration of intervertebral disc of lumbar region    Other Visit Diagnoses    Need for vaccination    -  Primary   Relevant Orders   Flu vaccine HIGH DOSE PF (Fluzone High dose) (Completed)      No orders of the defined types were placed in this encounter.  1. Need for  vaccination  - Flu vaccine HIGH DOSE PF (Fluzone High dose)  2. Essential hypertension Cont med 3. Chronic diastolic CHF (congestive heart failure) (HCC) Cont med  4. MITRAL REGURGITATION   5. Simple chronic bronchitis ([place]) Cont med  6. Hypothyroidism due to acquired atrophy of thyroid Cont med  7. Degeneration of intervertebral disc of lumbar region Cont med  8. Lumbar and sacral osteoarthritis

## 2016-10-11 ENCOUNTER — Ambulatory Visit (INDEPENDENT_AMBULATORY_CARE_PROVIDER_SITE_OTHER): Payer: PPO | Admitting: Cardiovascular Disease

## 2016-10-11 ENCOUNTER — Encounter: Payer: Self-pay | Admitting: Cardiovascular Disease

## 2016-10-11 VITALS — BP 110/58 | HR 62 | Ht 59.0 in | Wt 91.8 lb

## 2016-10-11 DIAGNOSIS — I10 Essential (primary) hypertension: Secondary | ICD-10-CM

## 2016-10-11 DIAGNOSIS — J410 Simple chronic bronchitis: Secondary | ICD-10-CM

## 2016-10-11 DIAGNOSIS — R2681 Unsteadiness on feet: Secondary | ICD-10-CM

## 2016-10-11 DIAGNOSIS — I5032 Chronic diastolic (congestive) heart failure: Secondary | ICD-10-CM

## 2016-10-11 DIAGNOSIS — I080 Rheumatic disorders of both mitral and aortic valves: Secondary | ICD-10-CM

## 2016-10-11 DIAGNOSIS — J849 Interstitial pulmonary disease, unspecified: Secondary | ICD-10-CM

## 2016-10-11 NOTE — Progress Notes (Signed)
Cardiology Office Note  Date:  10/11/2016   ID:  Jennifer Skinner, DOB [birth year], MRN [id number]  PCP:  [name], MD   Chief Complaint  Patient presents with  . Follow-up    6 MONTH    HPI:  82 year old female with history of mitral valve prolapse and regurgitation, diastolic dysfunction, COPD, and GERD, hospital admission 10/10/2015 for acute respiratory distress with hypoxia and COPD exacerbation in the setting of right lower lobe PNA and acute on chronic diastolic CHF, who presents today for follow-up in the office for acute on chronic diastolic CHF  In general she reports that she is doing well. Denies any  shortness of breath She takes four potassium per day, Lasix twice a day Denies any significant cough, PND or orthopnea, no leg edema Active at baseline, no regular exercise program No prior smoking history  Reports that she does have chronic Back pain, feels tired, when she walks she takes lots of breaks Legs ok, though week Feels well in general, denies weight loss    EKG on today's visit shows normal sinus rhythm with rate 62 bpm, no significant ST or T-wave changes  Other past medical history reviewed  10/07/2015, CXR showed RLL PNA superimposed on COPD/emphysema, small right parapneumonic effusion, stable cardiomegaly without evidence of pulmonary edema. CBC unremarkable. She was diagnosed with a right lower lobe pneumonia at that time and started on Levaquin with outpatient follow up. She returned to the ED on 10/31 with increased SOB and leg swelling x 3 days. She initially felt the LEE was 2/2 Levaquin and the urgent care changed her to doxycycline over the phone. She was found to have a BNP of 1528 upon her arrival to [facility].   Echo showed normal LV systolic funation at 123456, normal wall motion, GR1DD, mitral valve prolasp with moderate regurgitation, mild to moderate TR, PASP 56 mm Hg.   PMH:   has a past medical history of 1st degree AV block;  Arthritis; Chronic diastolic CHF (congestive heart failure) ([place]); COPD (chronic obstructive pulmonary disease) ([place]); Degenerative disc disease, lumbar; Gastro-esophageal reflux; Mitral prolapse; Mitral regurgitation; and Osteoporosis.  PSH:    Past Surgical History:  Procedure Laterality Date  . ABDOMINAL HYSTERECTOMY    . APPENDECTOMY    . DILATION AND CURETTAGE OF UTERUS    . HYSTEROTOMY    . right breast biopsy    . right eye      surgery  . TONSILLECTOMY      Current Outpatient Prescriptions  Medication Sig Dispense Refill  . albuterol (PROVENTIL) (2.5 MG/3ML) 0.083% nebulizer solution Take 3 mLs (2.5 mg total) by nebulization every 6 (six) hours as needed for wheezing or shortness of breath. 75 mL 12  . Apple Cider Vinegar (APPLE CIDER VINEGAR ULTRA) 600 MG CAPS 2 teaspoons once a day    . aspirin 325 MG tablet Take 325 mg by mouth daily.    . Calcium Gluconate 500 MG CAPS Take 2 capsules by mouth daily.     . furosemide (LASIX) 20 MG tablet Take 1 tablet (20 mg total) by mouth 2 (two) times daily. 180 tablet 3  . gabapentin (NEURONTIN) 100 MG capsule Take 1 capsule (100 mg total) by mouth 3 (three) times daily. 90 capsule 6  . levothyroxine (SYNTHROID, LEVOTHROID) 25 MCG tablet Take 1 tablet (25 mcg total) by mouth daily before breakfast. Ran out of meds. 90 tablet 3  . metoprolol tartrate (LOPRESSOR) 25 MG tablet Take 0.5 tablets (  12.5 mg total) by mouth 2 (two) times daily. 90 tablet 3  . Multiple Vitamin (MULTI-VITAMIN DAILY PO) Take by mouth daily.    [name] (OYSTER CALCIUM) 500 MG TABS Take 500 mg of elemental calcium by mouth 2 (two) times daily.    . polyethylene glycol powder (GLYCOLAX/MIRALAX) powder Take 0.5 Containers by mouth daily as needed for mild constipation or moderate constipation.     . potassium chloride (K-DUR) 10 MEQ tablet Take 1 tablet (10 mEq total) by mouth 4 (four) times daily. 360 tablet 3  . traMADol (ULTRAM) 50 MG tablet Take 1 tablet by  mouth every 6 (six) hours as needed for moderate pain.      No current facility-administered medications for this visit.      Allergies:   Penicillins   Social History:  The patient  reports that she has never smoked. She has never used smokeless tobacco. She reports that she does not drink alcohol or use drugs.   Family History:   family history includes CAD in her brother; CVA (age of onset: 68) in her mother; Heart attack (age of onset: 105) in her father; Throat cancer in her sister.    Review of Systems: Review of Systems  Constitutional: Negative.   Respiratory: Negative.   Cardiovascular: Negative.   Gastrointestinal: Negative.   Musculoskeletal: Positive for back pain.       Gait instability  Neurological: Negative.   Psychiatric/Behavioral: Negative.   All other systems reviewed and are negative.    PHYSICAL EXAM: VS:  BP (!) 110/58   Pulse 62   Ht 4\' 11"  (1.499 m)   Wt 91 lb 12.8 oz (41.6 kg)   BMI 18.54 kg/m  , BMI Body mass index is 18.54 kg/m. GEN:  in no acute distress, kyphosis, very thin  HEENT: normal  Neck: no JVD, carotid bruits, or masses Cardiac: RRR; 2+ SEM left sternal border radiating into the axilla, no rubs, or gallops,no edema  Respiratory:  clear to auscultation bilaterally, normal work of breathing GI: soft, nontender, nondistended, + BS MS: no deformity or atrophy  Skin: warm and dry, no rash Neuro:  Strength and sensation are intact Psych: euthymic mood, full affect    Recent Labs: 12/02/2015: BUN 18; Creatinine, Ser 0.39; Hemoglobin 13.1; Platelets 225; Potassium 4.0; Sodium 132 03/19/2016: TSH 3.720    Lipid Panel No results found for: CHOL, HDL, LDLCALC, TRIG    Wt Readings from Last 3 Encounters:  10/11/16 91 lb 12.8 oz (41.6 kg)  09/25/16 91 lb 12.8 oz (41.6 kg)  09/04/16 91 lb (41.3 kg)       ASSESSMENT AND PLAN:  Chronic diastolic CHF (congestive heart failure) (HCC) Appears relatively euvolemic on today's  visit Lab work orders placed today, BMP drawn today in the office Discussed her potassium dosing. We'll adjust based on the lab work results  Simple chronic bronchitis ([place]) Lungs relatively clear, reports she's not had any recurrence of her upper respiratory infection  Essential hypertension Blood pressure is well controlled on today's visit. No changes made to the medications.  MITRAL REGURGITATION Moderate mitral valve regurgitation, murmur appreciated on exam   that she is stable, no further testing needed   gait instability No regular exercise program, no recent falls    Total encounter time more than 25 minutes  Greater than 50% was spent in counseling and coordination of care with the patient   Disposition:   F/U  12 months    Signed, Tim  Rockey Situ, M.D., Ph.D. 10/11/2016  [place], [place]

## 2016-10-11 NOTE — Patient Instructions (Addendum)
Medication Instructions:   No medication changes made  Labwork:  We will check a BMP today  Testing/Procedures:  No further testing at this time   Follow-Up: It was a pleasure seeing you in the office today. Please call us if you have new issues that need to be addressed before your next appt.  [phone]  Your physician wants you to follow-up in: 12 months.  You will receive a reminder letter in the mail two months in advance. If you don't receive a letter, please call our office to schedule the follow-up appointment.  If you need a refill on your cardiac medications before your next appointment, please call your pharmacy.

## 2016-10-12 LAB — BASIC METABOLIC PANEL
BUN / CREAT RATIO: 26 (ref 12–28)
BUN: 12 mg/dL (ref 8–27)
CO2: 23 mmol/L (ref 18–29)
CREATININE: 0.47 mg/dL — AB (ref 0.57–1.00)
Calcium: 9 mg/dL (ref 8.7–10.3)
Chloride: 94 mmol/L — ABNORMAL LOW (ref 96–106)
GFR calc non Af Amer: 92 mL/min/{1.73_m2} (ref >59)
GFR, EST AFRICAN AMERICAN: 106 mL/min/{1.73_m2} (ref >59)
GLUCOSE: 82 mg/dL (ref 65–99)
Potassium: 4.6 mmol/L (ref 3.5–5.2)
SODIUM: 135 mmol/L (ref 134–144)

## 2016-11-12 ENCOUNTER — Other Ambulatory Visit: Payer: Self-pay | Admitting: Family Medicine

## 2016-12-31 ENCOUNTER — Other Ambulatory Visit: Payer: Self-pay

## 2016-12-31 MED ORDER — POTASSIUM CHLORIDE ER 10 MEQ PO TBCR: 10.0000 meq | EXTENDED_RELEASE_TABLET | Freq: Four times a day (QID) | ORAL | 3 refills | Status: DC

## 2017-01-15 DIAGNOSIS — M5416 Radiculopathy, lumbar region: Secondary | ICD-10-CM | POA: Diagnosis not present

## 2017-01-15 DIAGNOSIS — M5136 Other intervertebral disc degeneration, lumbar region: Secondary | ICD-10-CM | POA: Diagnosis not present

## 2017-01-15 DIAGNOSIS — M48062 Spinal stenosis, lumbar region with neurogenic claudication: Secondary | ICD-10-CM | POA: Diagnosis not present

## 2017-01-29 ENCOUNTER — Ambulatory Visit: Payer: PPO | Admitting: Family Medicine

## 2017-02-11 ENCOUNTER — Encounter: Payer: Self-pay | Admitting: Family Medicine

## 2017-02-11 ENCOUNTER — Ambulatory Visit (INDEPENDENT_AMBULATORY_CARE_PROVIDER_SITE_OTHER): Payer: PPO | Admitting: Family Medicine

## 2017-02-11 VITALS — BP 123/63 | HR 64 | Temp 98.4°F | Resp 16 | Ht 59.0 in | Wt 90.0 lb

## 2017-02-11 DIAGNOSIS — I10 Essential (primary) hypertension: Secondary | ICD-10-CM | POA: Diagnosis not present

## 2017-02-11 DIAGNOSIS — M810 Age-related osteoporosis without current pathological fracture: Secondary | ICD-10-CM | POA: Diagnosis not present

## 2017-02-11 DIAGNOSIS — M5416 Radiculopathy, lumbar region: Secondary | ICD-10-CM

## 2017-02-11 DIAGNOSIS — I5032 Chronic diastolic (congestive) heart failure: Secondary | ICD-10-CM | POA: Diagnosis not present

## 2017-02-11 DIAGNOSIS — K219 Gastro-esophageal reflux disease without esophagitis: Secondary | ICD-10-CM

## 2017-02-11 DIAGNOSIS — J410 Simple chronic bronchitis: Secondary | ICD-10-CM | POA: Diagnosis not present

## 2017-02-11 DIAGNOSIS — E034 Atrophy of thyroid (acquired): Secondary | ICD-10-CM | POA: Diagnosis not present

## 2017-02-11 NOTE — Progress Notes (Signed)
Name: Jennifer Skinner   MRN: [id number]    DOB: 07-Oct-1931   Date:02/11/2017       Progress Note  Subjective  Chief Complaint  Chief Complaint  Patient presents with  . Hypertension    HPI Here for f/u of HBP.  Also with low back pain that is chronic.  She rarely usees nebulizer for asthma.  Low thyroid that is controlled on meds.  No problem-specific Assessment & Plan notes found for this encounter.   Past Medical History:  Diagnosis Date  . 1st degree AV block   . Arthritis   . Chronic diastolic CHF (congestive heart failure) ([place])    a. echo 2014: EF 55-60%, nl wall motion, GR1DD, mild MR, moder mitral prolapse, PASP 40 mm Hg; b. echo 09/2015: EF 60-65%, nl wall motion, GR1DD, mod MR, mild to mod TR, PASP 56 mm Hg  . COPD (chronic obstructive pulmonary disease) ([place])   . Degenerative disc disease, lumbar   . Gastro-esophageal reflux   . Mitral prolapse   . Mitral regurgitation   . Osteoporosis     Past Surgical History:  Procedure Laterality Date  . ABDOMINAL HYSTERECTOMY    . APPENDECTOMY    . DILATION AND CURETTAGE OF UTERUS    . HYSTEROTOMY    . right breast biopsy    . right eye      surgery  . TONSILLECTOMY      Family History  Problem Relation Age of Onset  . CVA Mother 4  . Heart attack Father 51  . Throat cancer Sister   . CAD Brother     Social History   Social History  . Marital status: Married    Spouse name: N/A  . Number of children: N/A  . Years of education: N/A   Occupational History  . Not on file.   Social History Main Topics  . Smoking status: Never Smoker  . Smokeless tobacco: Never Used  . Alcohol use No  . Drug use: No  . Sexual activity: Not on file   Other Topics Concern  . Not on file   Social History Narrative  . No narrative on file     Current Outpatient Prescriptions:  .  albuterol (PROVENTIL) (2.5 MG/3ML) 0.083% nebulizer solution, Take 3 mLs (2.5 mg total) by nebulization every 6 (six) hours as needed for  wheezing or shortness of breath., Disp: 75 mL, Rfl: 12 .  Apple Cider Vinegar (APPLE CIDER VINEGAR ULTRA) 600 MG CAPS, 2 teaspoons once a day, Disp: , Rfl:  .  aspirin 325 MG tablet, Take 325 mg by mouth daily., Disp: , Rfl:  .  Calcium Gluconate 500 MG CAPS, Take 2 capsules by mouth daily. , Disp: , Rfl:  .  furosemide (LASIX) 20 MG tablet, Take 1 tablet (20 mg total) by mouth 2 (two) times daily., Disp: 180 tablet, Rfl: 3 .  gabapentin (NEURONTIN) 100 MG capsule, Take 1 capsule (100 mg total) by mouth 3 (three) times daily., Disp: 90 capsule, Rfl: 6 .  levothyroxine (SYNTHROID, LEVOTHROID) 25 MCG tablet, Take 1 tablet (25 mcg total) by mouth daily before breakfast. Ran out of meds., Disp: 90 tablet, Rfl: 3 .  metoprolol tartrate (LOPRESSOR) 25 MG tablet, Take 0.5 tablets (12.5 mg total) by mouth 2 (two) times daily., Disp: 90 tablet, Rfl: 3 .  Multiple Vitamin (MULTI-VITAMIN DAILY PO), Take by mouth daily., Disp: , Rfl:  .  Oyster Shell (OYSTER CALCIUM) 500 MG TABS, Take 500 mg of  elemental calcium by mouth 2 (two) times daily., Disp: , Rfl:  .  polyethylene glycol powder (GLYCOLAX/MIRALAX) powder, Take 0.5 Containers by mouth daily as needed for mild constipation or moderate constipation. , Disp: , Rfl:  .  potassium chloride (K-DUR) 10 MEQ tablet, Take 1 tablet (10 mEq total) by mouth 4 (four) times daily., Disp: 360 tablet, Rfl: 3 .  traMADol (ULTRAM) 50 MG tablet, Take 1 tablet by mouth every 6 (six) hours as needed for moderate pain. , Disp: , Rfl:   Allergies  Allergen Reactions  . Penicillins      Review of Systems  Constitutional: Negative for chills, fever, malaise/fatigue and weight loss.  HENT: Negative for hearing loss and tinnitus.   Eyes: Negative for blurred vision and double vision.  Respiratory: Negative for cough, shortness of breath and wheezing.   Cardiovascular: Negative for chest pain, palpitations and leg swelling.  Gastrointestinal: Negative for abdominal pain,  blood in stool and heartburn.  Genitourinary: Negative for dysuria, frequency and urgency.  Musculoskeletal: Negative for back pain and myalgias.  Skin: Negative for rash.  Neurological: Negative for dizziness, tingling, tremors, weakness and headaches.      Objective  Vitals:   02/11/17 1040  BP: 123/63  Pulse: 64  Resp: 16  Temp: 98.4 F (36.9 C)  TempSrc: Oral  SpO2: 92%  Weight: 90 lb (40.8 kg)  Height: 4\' 11"  (1.499 m)    Physical Exam  Constitutional: She is oriented to person, place, and time and well-developed, well-nourished, and in no distress. No distress.  HENT:  Head: Normocephalic and atraumatic.  Eyes: Conjunctivae and EOM are normal. Pupils are equal, round, and reactive to light. No scleral icterus.  Neck: Normal range of motion. Carotid bruit is not present. No thyromegaly present.  Cardiovascular: Normal rate, regular rhythm and normal heart sounds.  Exam reveals no gallop and no friction rub.   No murmur heard. Pulmonary/Chest: Effort normal and breath sounds normal. No respiratory distress. She has no wheezes. She has no rales.  Abdominal: Bowel sounds are normal. She exhibits no distension and no mass. There is no tenderness.  Musculoskeletal: She exhibits edema (Trace edema L ankle).  Marked kyphosis  Lymphadenopathy:    She has no cervical adenopathy.  Neurological: She is alert and oriented to person, place, and time.  Vitals reviewed.      No results found for this or any previous visit (from the past 2160 hour(s)).   Assessment & Plan  Problem List Items Addressed This Visit      Cardiovascular and Mediastinum   Chronic diastolic CHF (congestive heart failure) (HCC)   HBP (high blood pressure) - Primary     Respiratory   COPD (chronic obstructive pulmonary disease) (HCC)     Digestive   GERD     Endocrine   Hypothyroidism     Nervous and Auditory   Lumbar radiculitis     Musculoskeletal and Integument   OP (osteoporosis)       No orders of the defined types were placed in this encounter.  1. Essential hypertension Cont meds 2. Chronic diastolic CHF (congestive heart failure) (HCC) Cont meds  3. Simple chronic bronchitis ([place])   4. Gastroesophageal reflux disease without esophagitis Cont meds  5. Hypothyroidism due to acquired atrophy of thyroid Cont med  6. Lumbar radiculitis Cont meds  7. Age related osteoporosis, unspecified pathological fracture presence

## 2017-02-19 ENCOUNTER — Other Ambulatory Visit: Payer: PPO

## 2017-02-20 ENCOUNTER — Other Ambulatory Visit: Payer: Self-pay | Admitting: *Deleted

## 2017-02-20 DIAGNOSIS — E034 Atrophy of thyroid (acquired): Secondary | ICD-10-CM

## 2017-02-20 MED ORDER — LEVOTHYROXINE SODIUM 25 MCG PO TABS: 25.0000 ug | ORAL_TABLET | Freq: Every day | ORAL | 3 refills | Status: DC

## 2017-02-21 ENCOUNTER — Other Ambulatory Visit: Payer: PPO

## 2017-02-21 DIAGNOSIS — E034 Atrophy of thyroid (acquired): Secondary | ICD-10-CM | POA: Diagnosis not present

## 2017-02-21 DIAGNOSIS — I10 Essential (primary) hypertension: Secondary | ICD-10-CM | POA: Diagnosis not present

## 2017-02-21 DIAGNOSIS — K219 Gastro-esophageal reflux disease without esophagitis: Secondary | ICD-10-CM | POA: Diagnosis not present

## 2017-02-21 DIAGNOSIS — I5032 Chronic diastolic (congestive) heart failure: Secondary | ICD-10-CM | POA: Diagnosis not present

## 2017-02-21 LAB — CBC WITH DIFFERENTIAL/PLATELET
BASOS ABS: 33 {cells}/uL (ref 0–200)
BASOS PCT: 1 %
EOS ABS: 66 {cells}/uL (ref 15–500)
EOS PCT: 2 %
HCT: 40.4 % (ref 35.0–45.0)
HEMOGLOBIN: 13.5 g/dL (ref 11.7–15.5)
LYMPHS ABS: 693 {cells}/uL — AB (ref 850–3900)
Lymphocytes Relative: 21 %
MCH: 33 pg (ref 27.0–33.0)
MCHC: 33.4 g/dL (ref 32.0–36.0)
MCV: 98.8 fL (ref 80.0–100.0)
MPV: 9.2 fL (ref 7.5–12.5)
Monocytes Absolute: 396 cells/uL (ref 200–950)
Monocytes Relative: 12 %
NEUTROS ABS: 2112 {cells}/uL (ref 1500–7800)
Neutrophils Relative %: 64 %
Platelets: 242 10*3/uL (ref 140–400)
RBC: 4.09 MIL/uL (ref 3.80–5.10)
RDW: 13.6 % (ref 11.0–15.0)
WBC: 3.3 10*3/uL — ABNORMAL LOW (ref 3.8–10.8)

## 2017-02-22 LAB — COMPLETE METABOLIC PANEL WITH GFR
ALBUMIN: 3.5 g/dL — AB (ref 3.6–5.1)
ALK PHOS: 65 U/L (ref 33–130)
ALT: 21 U/L (ref 6–29)
AST: 26 U/L (ref 10–35)
BILIRUBIN TOTAL: 0.4 mg/dL (ref 0.2–1.2)
BUN: 15 mg/dL (ref 7–25)
CALCIUM: 8.9 mg/dL (ref 8.6–10.4)
CO2: 29 mmol/L (ref 20–31)
CREATININE: 0.53 mg/dL — AB (ref 0.60–0.88)
Chloride: 98 mmol/L (ref 98–110)
GFR, Est African American: >89 mL/min (ref >=60)
GFR, Est Non African American: 88 mL/min (ref >=60)
Glucose, Bld: 70 mg/dL (ref 65–99)
POTASSIUM: 4.2 mmol/L (ref 3.5–5.3)
Sodium: 136 mmol/L (ref 135–146)
TOTAL PROTEIN: 6.8 g/dL (ref 6.1–8.1)

## 2017-02-22 LAB — LIPID PANEL
CHOL/HDL RATIO: 2.8 ratio (ref <5.0)
CHOLESTEROL: 192 mg/dL (ref <200)
HDL: 68 mg/dL (ref >50)
LDL Cholesterol: 115 mg/dL — ABNORMAL HIGH (ref <100)
Triglycerides: 47 mg/dL (ref <150)
VLDL: 9 mg/dL (ref <30)

## 2017-02-22 LAB — TSH: TSH: 4.43 mIU/L

## 2017-03-01 ENCOUNTER — Encounter: Payer: Self-pay | Admitting: *Deleted

## 2017-03-01 ENCOUNTER — Ambulatory Visit
Admission: EM | Admit: 2017-03-01 | Discharge: 2017-03-01 | Disposition: A | Discharge status: Home / Self Care | Payer: PPO | Attending: Family Medicine | Admitting: Family Medicine

## 2017-03-01 DIAGNOSIS — L03012 Cellulitis of left finger: Secondary | ICD-10-CM

## 2017-03-01 MED ORDER — MUPIROCIN 2 % EX OINT: 1.0000 "application " | TOPICAL_OINTMENT | Freq: Three times a day (TID) | CUTANEOUS | 0 refills | Status: DC

## 2017-03-01 MED ORDER — DOXYCYCLINE HYCLATE 100 MG PO CAPS: 100.0000 mg | ORAL_CAPSULE | Freq: Two times a day (BID) | ORAL | 0 refills | Status: DC

## 2017-03-01 NOTE — ED Provider Notes (Signed)
CSN: [id number]     Arrival date & time 03/01/17  1102 History   First MD Initiated Contact with Patient 03/01/17 1200     Chief Complaint  Patient presents with  . Hand Pain   (Consider location/radiation/quality/duration/timing/severity/associated sxs/prior Treatment) HPI  This a 83 year old female who is accompanied by her husband presents with her left middle finger swelling and drainage for 3 days. Now it is more swollen today than it has been in the past. Noticed pus around the base of her left nondominant middle finger and the nail fold. It is been throbbing and very painful to the touch. Denies any fever or chills. Similar paronychias on both hands in the past.       Past Medical History:  Diagnosis Date  . 1st degree AV block   . Arthritis   . Chronic diastolic CHF (congestive heart failure) ([place])    a. echo 2014: EF 55-60%, nl wall motion, GR1DD, mild MR, moder mitral prolapse, PASP 40 mm Hg; b. echo 09/2015: EF 60-65%, nl wall motion, GR1DD, mod MR, mild to mod TR, PASP 56 mm Hg  . COPD (chronic obstructive pulmonary disease) ([place])   . Degenerative disc disease, lumbar   . Gastro-esophageal reflux   . Mitral prolapse   . Mitral regurgitation   . Osteoporosis    Past Surgical History:  Procedure Laterality Date  . ABDOMINAL HYSTERECTOMY    . APPENDECTOMY    . DILATION AND CURETTAGE OF UTERUS    . HYSTEROTOMY    . right breast biopsy    . right eye      surgery  . TONSILLECTOMY     Family History  Problem Relation Age of Onset  . CVA Mother 59  . Heart attack Father 29  . Throat cancer Sister   . CAD Brother    Social History  Substance Use Topics  . Smoking status: Never Smoker  . Smokeless tobacco: Never Used  . Alcohol use No   OB History    No data available     Review of Systems  Constitutional: Positive for activity change. Negative for chills, fatigue and fever.  Skin: Positive for color change and wound.  All other systems reviewed and are  negative.   Allergies  Penicillins  Home Medications   Prior to Admission medications   Medication Sig Start Date End Date Taking? Authorizing Provider  [place] (APPLE CIDER VINEGAR ULTRA) 600 MG CAPS 2 teaspoons once a day   Yes Historical Provider, MD  aspirin 325 MG tablet Take 325 mg by mouth daily.   Yes Historical Provider, MD  Calcium Gluconate 500 MG CAPS Take 2 capsules by mouth daily.    Yes Historical Provider, MD  furosemide (LASIX) 20 MG tablet Take 1 tablet (20 mg total) by mouth 2 (two) times daily. 03/13/16  Yes Arlis Porta., MD  gabapentin (NEURONTIN) 100 MG capsule Take 1 capsule (100 mg total) by mouth 3 (three) times daily. 11/12/16  Yes Arlis Porta., MD  levothyroxine (SYNTHROID, LEVOTHROID) 25 MCG tablet Take 1 tablet (25 mcg total) by mouth daily before breakfast. Ran out of meds. 02/20/17  Yes Arlis Porta., MD  metoprolol tartrate (LOPRESSOR) 25 MG tablet Take 0.5 tablets (12.5 mg total) by mouth 2 (two) times daily. 03/13/16  Yes Arlis Porta., MD  Multiple Vitamin (MULTI-VITAMIN DAILY PO) Take by mouth daily.   Yes Historical Provider, MD  [name] (OYSTER CALCIUM) 500 MG  TABS Take 500 mg of elemental calcium by mouth 2 (two) times daily.   Yes Historical Provider, MD  polyethylene glycol powder (GLYCOLAX/MIRALAX) powder Take 0.5 Containers by mouth daily as needed for mild constipation or moderate constipation.  10/13/15  Yes Historical Provider, MD  potassium chloride (K-DUR) 10 MEQ tablet Take 1 tablet (10 mEq total) by mouth 4 (four) times daily. 12/31/16  Yes Minna Merritts, MD  traMADol (ULTRAM) 50 MG tablet Take 1 tablet by mouth every 6 (six) hours as needed for moderate pain.  08/23/14  Yes Historical Provider, MD  albuterol (PROVENTIL) (2.5 MG/3ML) 0.083% nebulizer solution Take 3 mLs (2.5 mg total) by nebulization every 6 (six) hours as needed for wheezing or shortness of breath. 10/12/15   Epifanio Lesches, MD   doxycycline (VIBRAMYCIN) 100 MG capsule Take 1 capsule (100 mg total) by mouth 2 (two) times daily. 03/01/17   Lorin Picket, PA-C  mupirocin ointment (BACTROBAN) 2 % Apply 1 application topically 3 (three) times daily. 03/01/17   Lorin Picket, PA-C   Meds Ordered and Administered this Visit  Medications - No data to display  BP 122/68 (BP Location: Left Arm)   Pulse 67   Temp 97.7 F (36.5 C) (Oral)   Resp 14   SpO2 98%  No data found.   Physical Exam  Constitutional: She is oriented to person, place, and time. She appears well-developed and well-nourished. No distress.  HENT:  Head: Normocephalic and atraumatic.  Eyes: Pupils are equal, round, and reactive to light. Right eye exhibits no discharge. Left eye exhibits no discharge.  Neck: Normal range of motion. Neck supple.  Musculoskeletal: She exhibits edema and tenderness.  Neurological: She is alert and oriented to person, place, and time.  Skin: Skin is warm and dry. She is not diaphoretic. There is erythema.  Psychiatric: She has a normal mood and affect. Her behavior is normal. Judgment and thought content normal.  Nursing note and vitals reviewed.   Urgent Care Course     Procedures (including critical care time)  Labs Review Labs Reviewed - No data to display  Imaging Review No results found.   Visual Acuity Review  Right Eye Distance:   Left Eye Distance:   Bilateral Distance:    Right Eye Near:   Left Eye Near:    Bilateral Near:     Procedure: The patient's left middle finger was prepped with Hibiclens cleanser. An 18-gauge needle was then used to carefully tease away in from the purulent loculation. Return of a copious amount of purulence. The area was gently massaged expressing as much purulence as possible. This was continued until this sanguinous fluid was expressed. The area was then throughout triple antibiotic ointment and a dry sterile dressing was applied. The patient will be started on a  4 time daily warm compress program followed by thorough drying and application of Bactroban ointment. I have also placed her on 3 days of doxycycline. Do not appear to be any evidence of a felon at the present time. The pulp tissues were very soft. Cause of the amount of infection and I will have her return in 2-3 days for a follow-up wound recheck. Or if it worsens that she should be seen in emergency department.    MDM   1. Paronychia of left middle finger    Discharge Medication List as of 03/01/2017 12:30 PM    START taking these medications   Details  doxycycline (VIBRAMYCIN) 100 MG capsule Take  1 capsule (100 mg total) by mouth 2 (two) times daily., Starting Fri 03/01/2017, Normal    mupirocin ointment (BACTROBAN) 2 % Apply 1 application topically 3 (three) times daily., Starting Fri 03/01/2017, Normal      Plan: 1. Test/x-ray results and diagnosis reviewed with patient 2. rx as per orders; risks, benefits, potential side effects reviewed with patient 3. Recommend supportive treatment with Keeping area dry between warm compresses. In 2-3 days because of the extent of her infection. I placed her on Bactroban ointment after the warm compresses and doxycycline for 3 days. She worsens she needs go to the emergency room. 4. F/u prn if symptoms worsen or don't improve     Lorin Picket, PA-C 03/01/17 [address] Lynwood Dawley, PA-C 03/01/17 1249

## 2017-03-01 NOTE — ED Triage Notes (Signed)
PAtient started having symptoms of left middle finger swelling and drainage 3 days ago. PAtient does have a history of finger abscess.

## 2017-03-04 ENCOUNTER — Ambulatory Visit
Admission: EM | Admit: 2017-03-04 | Discharge: 2017-03-04 | Disposition: A | Discharge status: Home / Self Care | Payer: PPO | Attending: Emergency Medicine | Admitting: Emergency Medicine

## 2017-03-04 DIAGNOSIS — Z5189 Encounter for other specified aftercare: Secondary | ICD-10-CM

## 2017-03-04 NOTE — ED Provider Notes (Signed)
[facility] ____________________________________________  Time seen: Approximately 10:55 AM  I have reviewed the triage vital signs and the nursing notes.   HISTORY  Chief Complaint Hand Pain (follow up)   HPI KRIZIA FLIGHT is a 83 y.o. female presenting with husband at bedside for wound check follow-up to left middle finger perineum he of that restrained in urgent care 3 days ago. Patient reports the area had come up fairly quickly. Patient denies any injury, trauma, noon break in skin to cause infection. Reports has a history of similar in the past. Reports paronychia was drained when seen 3 days ago. Patient and husband report no drainage since and the area is much improved. Reports she did take 3 days of oral doxycycline as prescribed and has been applying topical Bactroban. Denies any pain to the site. Denies any numbness, decreased range of motion or worsening concerns. Patient again reports area is improving and doing well. Denies chest pain, shortness of breath, abdominal pain, or other complaints.   [name], MD: PCP Reports her primary care physician monitors and keeps her immunizations up-to-date.  Past Medical History:  Diagnosis Date  . 1st degree AV block   . Arthritis   . Chronic diastolic CHF (congestive heart failure) ([place])    a. echo 2014: EF 55-60%, nl wall motion, GR1DD, mild MR, moder mitral prolapse, PASP 40 mm Hg; b. echo 09/2015: EF 60-65%, nl wall motion, GR1DD, mod MR, mild to mod TR, PASP 56 mm Hg  . COPD (chronic obstructive pulmonary disease) ([place])   . Degenerative disc disease, lumbar   . Gastro-esophageal reflux   . Mitral prolapse   . Mitral regurgitation   . Osteoporosis     Patient Active Problem List   Diagnosis Date Noted  . HBP (high blood pressure) 03/13/2016  . COPD (chronic obstructive pulmonary disease) ([place])   . Chronic diastolic CHF (congestive heart failure) ([place])   . 1st degree AV block   . Interstitial lung  disease ([place]) 10/17/2015  . Hypothyroidism 09/27/2015  . Lumbar and sacral osteoarthritis 03/11/2015  . Degeneration of intervertebral disc of lumbar region 07/13/2014  . Lumbar radiculitis 07/13/2014  . Lumbar stenosis with neurogenic claudication 07/13/2014  . Neutropenia ([place]) 03/31/2013  . ION (ischemic optic neuropathy) 09/30/2012  . OP (osteoporosis) 09/30/2012  . NONSPECIFIC ABN FINDNG RAD&OTH EXAM BILARY TRCT 05/30/2010  . NONSPECIFIC ABNORM RESULTS OTH SPEC FUNCT STUDY 05/30/2010  . MITRAL REGURGITATION 04/12/2009  . Mitral valve disorder 04/12/2009  . GERD 04/12/2009    Past Surgical History:  Procedure Laterality Date  . ABDOMINAL HYSTERECTOMY    . APPENDECTOMY    . DILATION AND CURETTAGE OF UTERUS    . HYSTEROTOMY    . right breast biopsy    . right eye      surgery  . TONSILLECTOMY       No current facility-administered medications for this encounter.   Current Outpatient Prescriptions:  .  albuterol (PROVENTIL) (2.5 MG/3ML) 0.083% nebulizer solution, Take 3 mLs (2.5 mg total) by nebulization every 6 (six) hours as needed for wheezing or shortness of breath., Disp: 75 mL, Rfl: 12 .  Apple Cider Vinegar (APPLE CIDER VINEGAR ULTRA) 600 MG CAPS, 2 teaspoons once a day, Disp: , Rfl:  .  aspirin 325 MG tablet, Take 325 mg by mouth daily., Disp: , Rfl:  .  Calcium Gluconate 500 MG CAPS, Take 2 capsules by mouth daily. , Disp: , Rfl:  .  furosemide (LASIX) 20 MG  tablet, Take 1 tablet (20 mg total) by mouth 2 (two) times daily., Disp: 180 tablet, Rfl: 3 .  gabapentin (NEURONTIN) 100 MG capsule, Take 1 capsule (100 mg total) by mouth 3 (three) times daily., Disp: 90 capsule, Rfl: 6 .  levothyroxine (SYNTHROID, LEVOTHROID) 25 MCG tablet, Take 1 tablet (25 mcg total) by mouth daily before breakfast. Ran out of meds., Disp: 90 tablet, Rfl: 3 .  metoprolol tartrate (LOPRESSOR) 25 MG tablet, Take 0.5 tablets (12.5 mg total) by mouth 2 (two) times daily., Disp: 90 tablet, Rfl:  3 .  Multiple Vitamin (MULTI-VITAMIN DAILY PO), Take by mouth daily., Disp: , Rfl:  .  mupirocin ointment (BACTROBAN) 2 %, Apply 1 application topically 3 (three) times daily., Disp: 22 g, Rfl: 0 .  Oyster Shell (OYSTER CALCIUM) 500 MG TABS, Take 500 mg of elemental calcium by mouth 2 (two) times daily., Disp: , Rfl:  .  polyethylene glycol powder (GLYCOLAX/MIRALAX) powder, Take 0.5 Containers by mouth daily as needed for mild constipation or moderate constipation. , Disp: , Rfl:  .  potassium chloride (K-DUR) 10 MEQ tablet, Take 1 tablet (10 mEq total) by mouth 4 (four) times daily., Disp: 360 tablet, Rfl: 3 .  traMADol (ULTRAM) 50 MG tablet, Take 1 tablet by mouth every 6 (six) hours as needed for moderate pain. , Disp: , Rfl:  .  doxycycline (VIBRAMYCIN) 100 MG capsule, Take 1 capsule (100 mg total) by mouth 2 (two) times daily., Disp: 6 capsule, Rfl: 0  Allergies Penicillins  Family History  Problem Relation Age of Onset  . CVA Mother 41  . Heart attack Father 2  . Throat cancer Sister   . CAD Brother     Social History Social History  Substance Use Topics  . Smoking status: Never Smoker  . Smokeless tobacco: Never Used  . Alcohol use No    Review of Systems Constitutional: No fever/chills Cardiovascular: Denies chest pain. Respiratory: Denies shortness of breath. Gastrointestinal: No abdominal pain.   Musculoskeletal: reports chronic back pain, no change. Skin: Negative for rash.  ____________________________________________   PHYSICAL EXAM:  VITAL SIGNS: ED Triage Vitals [03/04/17 1048]  Enc Vitals Group     BP (!) 127/58     Pulse Rate 67     Resp 16     Temp 98 F (36.7 C)     Temp Source Oral     SpO2      Weight      Height      Head Circumference      Peak Flow      Pain Score 0     Pain Loc      Pain Edu?      Excl. in [place]?     Constitutional: Alert and oriented. Well appearing and in no acute distress. Eyes: Conjunctivae are normal. PERRL.  EOMI. ENT      Head: Normocephalic and atraumatic. Cardiovascular: Normal rate, regular rhythm. Grossly normal heart sounds.  Good peripheral circulation. Respiratory: Normal respiratory effort without tachypnea nor retractions. Breath sounds are clear and equal bilaterally. No wheezes, rales, rhonchi. Musculoskeletal:  Ambulatory with steady gait. Neurologic:  Normal speech and language.  Skin:  Skin is warm, dry. Except : Left third distal digit at ulnar aspect lateral nail base minimal erythema along cuticle line, no fluctuance, nontender, no induration, no drainage, left third digit with full range of motion and normal distal sensation and capillary refill. Left hand otherwise nontender.  Psychiatric: Mood and affect are  normal. Speech and behavior are normal. Patient exhibits appropriate insight and judgment   ___________________________________________   LABS (all labs ordered are listed, but only abnormal results are displayed)  Labs Reviewed - No data to display ____________________________________________  PROCEDURES Procedures   INITIAL IMPRESSION / ASSESSMENT AND PLAN / ED COURSE  Pertinent labs & imaging results that were available during my care of the patient were reviewed by me and considered in my medical decision making (see chart for details).  Well-appearing patient. No acute distress. Presenting for wound check to recent drained paronychia left third finger. Site appears to be healing well and patient reports feeling much better. Patient completed 3 days of oral doxycycline, no indication for repeat prescription of oral antibiotic. Encouraged to continue keeping clean, warm soapy water soaks and topical Bactroban. Discussed continued monitoring.   Discussed follow up with Primary care physician this week. Discussed follow up and return parameters including no resolution or any worsening concerns. Patient verbalized understanding and agreed to plan.    ____________________________________________   FINAL CLINICAL IMPRESSION(S) / ED DIAGNOSES  Final diagnoses:  Visit for wound check     Discharge Medication List as of 03/04/2017 11:07 AM      Note: This dictation was prepared with Dragon dictation along with smaller phrase technology. Any transcriptional errors that result from this process are unintentional.         Marylene Land, NP 03/04/17 1128

## 2017-03-04 NOTE — Discharge Instructions (Signed)
Continue medication as prescribed. Keep clean as discussed.   Follow up with your primary care physician this week as needed. Return to Urgent care for new or worsening concerns.

## 2017-03-04 NOTE — ED Triage Notes (Signed)
Patient is here for recheck of paronychia-drained. Patient  States that area is improving.

## 2017-03-05 ENCOUNTER — Ambulatory Visit: Payer: PPO | Admitting: Family

## 2017-03-12 ENCOUNTER — Other Ambulatory Visit: Payer: Self-pay | Admitting: *Deleted

## 2017-03-12 ENCOUNTER — Ambulatory Visit: Payer: PPO | Admitting: Family

## 2017-03-12 DIAGNOSIS — I10 Essential (primary) hypertension: Secondary | ICD-10-CM

## 2017-03-12 MED ORDER — METOPROLOL TARTRATE 25 MG PO TABS: 12.5000 mg | ORAL_TABLET | Freq: Two times a day (BID) | ORAL | 3 refills | Status: DC

## 2017-03-19 ENCOUNTER — Encounter: Payer: Self-pay | Admitting: Family

## 2017-03-19 ENCOUNTER — Ambulatory Visit: Payer: PPO | Attending: Family | Admitting: Family

## 2017-03-19 VITALS — BP 123/66 | HR 60 | Resp 18 | Ht <= 58 in | Wt 91.4 lb

## 2017-03-19 DIAGNOSIS — I110 Hypertensive heart disease with heart failure: Secondary | ICD-10-CM | POA: Diagnosis not present

## 2017-03-19 DIAGNOSIS — K219 Gastro-esophageal reflux disease without esophagitis: Secondary | ICD-10-CM | POA: Diagnosis not present

## 2017-03-19 DIAGNOSIS — Z880 Allergy status to penicillin: Secondary | ICD-10-CM | POA: Diagnosis not present

## 2017-03-19 DIAGNOSIS — M810 Age-related osteoporosis without current pathological fracture: Secondary | ICD-10-CM | POA: Insufficient documentation

## 2017-03-19 DIAGNOSIS — I5032 Chronic diastolic (congestive) heart failure: Secondary | ICD-10-CM | POA: Diagnosis not present

## 2017-03-19 DIAGNOSIS — Z8249 Family history of ischemic heart disease and other diseases of the circulatory system: Secondary | ICD-10-CM | POA: Diagnosis not present

## 2017-03-19 DIAGNOSIS — J449 Chronic obstructive pulmonary disease, unspecified: Secondary | ICD-10-CM | POA: Diagnosis not present

## 2017-03-19 DIAGNOSIS — I10 Essential (primary) hypertension: Secondary | ICD-10-CM

## 2017-03-19 DIAGNOSIS — I340 Nonrheumatic mitral (valve) insufficiency: Secondary | ICD-10-CM | POA: Insufficient documentation

## 2017-03-19 DIAGNOSIS — Z9889 Other specified postprocedural states: Secondary | ICD-10-CM | POA: Diagnosis not present

## 2017-03-19 DIAGNOSIS — Z800 Family history of malignant neoplasm of digestive organs: Secondary | ICD-10-CM | POA: Diagnosis not present

## 2017-03-19 DIAGNOSIS — Z7982 Long term (current) use of aspirin: Secondary | ICD-10-CM | POA: Diagnosis not present

## 2017-03-19 DIAGNOSIS — Z90710 Acquired absence of both cervix and uterus: Secondary | ICD-10-CM | POA: Insufficient documentation

## 2017-03-19 DIAGNOSIS — I341 Nonrheumatic mitral (valve) prolapse: Secondary | ICD-10-CM | POA: Insufficient documentation

## 2017-03-19 DIAGNOSIS — Z823 Family history of stroke: Secondary | ICD-10-CM | POA: Diagnosis not present

## 2017-03-19 DIAGNOSIS — J410 Simple chronic bronchitis: Secondary | ICD-10-CM

## 2017-03-19 NOTE — Patient Instructions (Signed)
Continue weighing daily and call for an overnight weight gain of > 2 pounds or a weekly weight gain of >5 pounds. 

## 2017-03-19 NOTE — Progress Notes (Signed)
Patient ID: Jennifer Skinner, female    DOB: 05-30-1931, 83 y.o.   MRN: [id number]  HPI  Ms Manger is a 83 y/o female who has a history of MV prolapse, COPD, HTN, GERD and HF with an EF of 60-65% who returns for follow-up today.   Last echo done 10/11/15 with an EF of 60-65% with moderate mitral regurgitation but without aortic stenosis. Mild to moderate TR,  PASP 56mg  Hg.  Was in the ED 03/04/17 for wound recheck of left middle finger. Discharged home. In the ED 03/01/17 due to left middle finger swelling and drainage for 3 days. Purulent area was drained and wound dressed. 3 days of antibiotics were given. Discharged home with follow-up in a few days. Last hospital admission was Oct 2016 for pneumonia and acute HF. Was treated with antibiotics and diuresed.  Returns today for follow-up with fatigue upon moderate exertion. Does not have to stop often when shopping. Minimal shortness of breath. Denies any swelling in her legs/abdomen. Continues to weigh and reports a stable weight.  Biggest issue is continued, chronic back pain when she walks which is due to her DDD. Not adding salt to her food.   Past Medical History:  Diagnosis Date  . 1st degree AV block   . Arthritis   . Chronic diastolic CHF (congestive heart failure) ([place])    a. echo 2014: EF 55-60%, nl wall motion, GR1DD, mild MR, moder mitral prolapse, PASP 40 mm Hg; b. echo 09/2015: EF 60-65%, nl wall motion, GR1DD, mod MR, mild to mod TR, PASP 56 mm Hg  . COPD (chronic obstructive pulmonary disease) ([place])   . Degenerative disc disease, lumbar   . Gastro-esophageal reflux   . Mitral prolapse   . Mitral regurgitation   . Osteoporosis    Past Surgical History:  Procedure Laterality Date  . ABDOMINAL HYSTERECTOMY    . APPENDECTOMY    . DILATION AND CURETTAGE OF UTERUS    . HYSTEROTOMY    . right breast biopsy    . right eye      surgery  . TONSILLECTOMY     Family History  Problem Relation Age of Onset  . CVA Mother 56  .  Heart attack Father 75  . Throat cancer Sister   . CAD Brother    Social History  Substance Use Topics  . Smoking status: Never Smoker  . Smokeless tobacco: Never Used  . Alcohol use No   Allergies  Allergen Reactions  . Penicillins    Prior to Admission medications   Medication Sig Start Date End Date Taking? Authorizing Provider  albuterol (PROVENTIL) (2.5 MG/3ML) 0.083% nebulizer solution Take 3 mLs (2.5 mg total) by nebulization every 6 (six) hours as needed for wheezing or shortness of breath. 10/12/15  Yes Epifanio Lesches, MD  Apple Cider Vinegar (APPLE CIDER VINEGAR ULTRA) 600 MG CAPS 2 teaspoons once a day   Yes Historical Provider, MD  aspirin 325 MG tablet Take 325 mg by mouth daily.   Yes Historical Provider, MD  Calcium Gluconate 500 MG CAPS Take 2 capsules by mouth daily.    Yes Historical Provider, MD  furosemide (LASIX) 20 MG tablet Take 1 tablet (20 mg total) by mouth 2 (two) times daily. 03/13/16  Yes Arlis Porta., MD  gabapentin (NEURONTIN) 100 MG capsule Take 1 capsule (100 mg total) by mouth 3 (three) times daily. 11/12/16  Yes Arlis Porta., MD  levothyroxine (SYNTHROID, LEVOTHROID) 25 MCG tablet Take  1 tablet (25 mcg total) by mouth daily before breakfast. Ran out of meds. 02/20/17  Yes Arlis Porta., MD  metoprolol tartrate (LOPRESSOR) 25 MG tablet Take 0.5 tablets (12.5 mg total) by mouth 2 (two) times daily. 03/12/17  Yes Olin Hauser, DO  Multiple Vitamin (MULTI-VITAMIN DAILY PO) Take by mouth daily.   Yes Historical Provider, MD  mupirocin ointment (BACTROBAN) 2 % Apply 1 application topically 3 (three) times daily. 03/01/17  Yes Lorin Picket, PA-C  Oyster Shell (OYSTER CALCIUM) 500 MG TABS Take 500 mg of elemental calcium by mouth 2 (two) times daily.   Yes Historical Provider, MD  polyethylene glycol powder (GLYCOLAX/MIRALAX) powder Take 0.5 Containers by mouth daily as needed for mild constipation or moderate constipation.   10/13/15  Yes Historical Provider, MD  potassium chloride (K-DUR) 10 MEQ tablet Take 1 tablet (10 mEq total) by mouth 4 (four) times daily. 12/31/16  Yes Minna Merritts, MD  traMADol (ULTRAM) 50 MG tablet Take 1 tablet by mouth every 6 (six) hours as needed for moderate pain.  08/23/14  Yes Historical Provider, MD    Review of Systems  Constitutional: Positive for fatigue. Negative for appetite change.  HENT: Negative for congestion, postnasal drip and sore throat.   Eyes: Negative.   Respiratory: Positive for shortness of breath. Negative for cough and chest tightness.   Cardiovascular: Negative for chest pain, palpitations and leg swelling.  Gastrointestinal: Negative for abdominal distention and abdominal pain.  Endocrine: Negative.   Genitourinary: Negative.   Musculoskeletal: Positive for back pain. Negative for neck pain.  Skin: Negative.   Allergic/Immunologic: Negative.   Neurological: Positive for dizziness (infrequent). Negative for light-headedness.  Hematological: Negative for adenopathy. Does not bruise/bleed easily.  Psychiatric/Behavioral: Negative for dysphoric mood, sleep disturbance (sleeping on 1 pillow) and suicidal ideas. The patient is not nervous/anxious.    Vitals:   03/19/17 1043  BP: 123/66  Pulse: 60  Resp: 18  SpO2: 94%  Weight: 91 lb 6 oz (41.4 kg)  Height: 4\' 9"  (1.448 m)   Wt Readings from Last 3 Encounters:  03/19/17 91 lb 6 oz (41.4 kg)  02/11/17 90 lb (40.8 kg)  10/11/16 91 lb 12.8 oz (41.6 kg)   Lab Results  Component Value Date   CREATININE 0.53 (L) 02/21/2017   CREATININE 0.47 (L) 10/11/2016   CREATININE 0.39 (L) 12/02/2015    Physical Exam  Constitutional: She is oriented to person, place, and time. She appears well-developed and well-nourished.  HENT:  Head: Normocephalic and atraumatic.  Eyes: Conjunctivae are normal. Pupils are equal, round, and reactive to light.  Neck: Normal range of motion. Neck supple. No JVD present.   Cardiovascular: Regular rhythm.  Bradycardia present.   Pulmonary/Chest: Effort normal. She has no wheezes. She has no rales.  Abdominal: Soft. She exhibits no distension. There is no tenderness.  Musculoskeletal: She exhibits no edema or tenderness.  Neurological: She is alert and oriented to person, place, and time.  Skin: Skin is warm and dry.  Psychiatric: She has a normal mood and affect. Her behavior is normal. Thought content normal.  Nursing note and vitals reviewed.    Assessment & Plan:  1: Chronic heart failure with preserved ejection fraction- - NYHA Class II - Volume status stable, lungs clear/no edema - Not adding salt to her food/reading food labels some - Continues to weigh daily. Reminded to call for an overnight weight gain of >2 pounds or weekly weight gain of >5 pounds. -  Continue furosemide/potassium twice daily - Saw cardiologist Rockey Situ) on 10/11/16  2: HTN- - BP looks good today - Continue lopressor 12.5mg  twice daily - Saw PCP on 02/11/17; returns 06/18/17  3: COPD- - Stable at this time - Continues with nebulizer as needed  Return here in 6 months or sooner for any questions/problems before then.

## 2017-06-04 ENCOUNTER — Ambulatory Visit (INDEPENDENT_AMBULATORY_CARE_PROVIDER_SITE_OTHER): Payer: PPO | Admitting: Family Medicine

## 2017-06-04 ENCOUNTER — Encounter: Payer: Self-pay | Admitting: Family Medicine

## 2017-06-04 VITALS — BP 115/59 | HR 64 | Temp 98.0°F | Ht <= 58 in | Wt 91.4 lb

## 2017-06-04 DIAGNOSIS — M47817 Spondylosis without myelopathy or radiculopathy, lumbosacral region: Secondary | ICD-10-CM | POA: Diagnosis not present

## 2017-06-04 DIAGNOSIS — I10 Essential (primary) hypertension: Secondary | ICD-10-CM

## 2017-06-04 DIAGNOSIS — M5416 Radiculopathy, lumbar region: Secondary | ICD-10-CM | POA: Diagnosis not present

## 2017-06-04 DIAGNOSIS — I5032 Chronic diastolic (congestive) heart failure: Secondary | ICD-10-CM

## 2017-06-04 DIAGNOSIS — M51369 Other intervertebral disc degeneration, lumbar region without mention of lumbar back pain or lower extremity pain: Secondary | ICD-10-CM

## 2017-06-04 DIAGNOSIS — M5136 Other intervertebral disc degeneration, lumbar region: Secondary | ICD-10-CM | POA: Diagnosis not present

## 2017-06-04 MED ORDER — FUROSEMIDE 20 MG PO TABS: 20.0000 mg | ORAL_TABLET | Freq: Two times a day (BID) | ORAL | 3 refills | Status: DC

## 2017-06-04 MED ORDER — GABAPENTIN 100 MG PO CAPS: 100.0000 mg | ORAL_CAPSULE | Freq: Three times a day (TID) | ORAL | 3 refills | Status: DC

## 2017-06-04 NOTE — Patient Instructions (Addendum)
Thank you for coming to the clinic today.  1. Keep up the good work  2. Refilled Gabapentin, Furosemide (lasix)  3. For your R ear, prefer to let it heal first, and may use topical neosporin to let this heal, avoid peroxide for now. Once skin heals and resolved consider future re-piercing.  4. Labs done 02/2017, I would recommend yearly blood test.  Please schedule a Follow-up Appointment to: Return in about 4 months (around 10/04/2017) for blood pressure.  If you have any other questions or concerns, please feel free to call the clinic or send a message through [place]. You may also schedule an earlier appointment if necessary.  Additionally, you may be receiving a survey about your experience at our clinic within a few days to 1 week by e-mail or mail. We value your feedback.  Nobie Putnam, DO [place]

## 2017-06-04 NOTE — Progress Notes (Signed)
Subjective:    Patient ID: Jennifer Skinner, female    DOB: 10/19/1931, 83 y.o.   MRN: [id number]  Jennifer Skinner is a 83 y.o. female presenting on 06/04/2017 for Hypertension  Accompanied by spouse, Meaghen Vecchiarelli. History provided primarily by patient.  HPI  CHRONIC HTN / Chronic Diastolic CHF / Mitral Valve Regurgitation / Palpitations Reports no new concerns, has a BP cuff to check if needed and has not had major problems with fluctuations of BP. Does admit occasional dizziness, not related to standing. She is followed by Cardiology Dr Rockey Situ, and has been on Metoprolol due to rare palpitations in past, which has been much better controlled now. Reports no concerns with CHF exacerbation, continues to limit salt and monitor weight. Current Meds - Metoprolol 12.70m (Half of 22mtab) BID, Furosemide 2066mID (for peripheral edema and prior history of fluid build up from her heart, has been stable on this dose without recurrence or exacerbation) - Taking Potassium 37m32m times daily Reports good compliance, took meds today. Tolerating well, w/o complaints. Lifestyle - Limited exercise - Taking Aspirin 325mg65mly, [place] with EF 60-65%, moderate mitral regurg without aortic stenosis Denies CP, dyspnea or exertional symptoms, HA, edema, dizziness / lightheadedness  Hypothyroidism - Taking Levothyroxine 25mcg68mly. Last TSH 02/2017, normal.  History of Abnormal Glucose (Occasional) Reports concern with feeling "slightly dizzy" after eating something very sugary and sweet, usually self limited, never diagnosed with Pre-DM or DM or had A1 before. CBGs: Does not check CBG Meds: None Currently not on ACEi / ARB Denies hypoglycemia, polyuria, visual changes, numbness or tingling.  Chronic Low Back Pain - Reports chronic problem with known DJD and degenerative disc disease. [facility] without any relief, then has been focused more on medication management, with Tramadol and Gabapentin. - Symptoms worse first in AM with stiffness, then gradual improvement with medications and activity - Taking Tylenol x daily night-time, and does not take NSAIDs - Taking Gabapentin 100mg 367mes daily - Has not tried regular heating pad - Denies any fevers/chills, numbness, tingling, weakness, loss of control bladder/bowel incontinence or retention, unintentional wt loss, night sweats  Additional PMH - L sided neck mass, followed by Dr Vaught,Pryor OchoaSocial History  Substance Use Topics  . Smoking status: Never Smoker  . Smokeless tobacco: Never Used  . Alcohol use No    Review of Systems Per HPI unless specifically indicated above     Objective:    BP (!) 115/59 (BP Location: Left Arm, Patient Position: Sitting, Cuff Size: Small)   Pulse 64   Temp 98 F (36.7 C) (Oral)   Ht _0  (1.448 m)   Wt 91 lb 6.4 oz (41.5 kg)   BMI 19.78 kg/m   Wt Readings from Last 3 Encounters:  06/04/17 91 lb 6.4 oz (41.5 kg)  03/19/17 91 lb 6 oz (41.4 kg)  02/11/17 90 lb (40.8 kg)    Physical Exam  Constitutional: She is oriented to person, place, and time. She appears well-developed and well-nourished. No distress.  Well-appearing elderly 85 year52ld female, comfortable, cooperative  HENT:  Head: Normocephalic and atraumatic.  Mouth/Throat: Oropharynx is clear and moist.  Frontal / maxillary sinuses non-tender. Nares patent without purulence or edema. Bilateral TMs clear without erythema, effusion or bulging mild dry cerumen present not impacting. Right ear lobe without ear  piercing opening seems healed over with some resolving dry skin old scab tissue and palpable nodule within earlobe likely scar tissue. Oropharynx clear without erythema, exudates, edema or asymmetry.  Eyes: Conjunctivae are normal. Right eye exhibits no discharge. Left eye exhibits no discharge.  Neck:  Normal range of motion. Neck supple. No thyromegaly present.  Left sided palpable soft mobile mass mid neck within excess skin tissue. Non tender, no erythema or edema.  Cardiovascular: Normal rate, regular rhythm, normal heart sounds and intact distal pulses.   No murmur heard. Pulmonary/Chest: Effort normal and breath sounds normal. No respiratory distress. She has no wheezes. She has no rales.  Good air movement, speaks full sentences  Musculoskeletal: Normal range of motion. She exhibits no edema.  Low Back Inspection: Normal appearance, thin body habitus, with increased thoracic kyphosis and loss of lumbar lordosis deformity, symmetrical. Palpation: No tenderness over spinous processes. Bilateral lumbar paraspinal muscles non-tender but with mild hypertonicity/spasm. ROM: Mostly full active ROM forward flex / back extension Special Testing: Seated SLR negative for radicular pain bilaterally Strength: Bilateral hip flex/ext 5/5, knee flex/ext 5/5, ankle dorsiflex/plantarflex 5/5 Neurovascular: intact distal sensation to light touch  Lymphadenopathy:    She has no cervical adenopathy.  Neurological: She is alert and oriented to person, place, and time.  Distal sensation intact to light touch  Skin: Skin is warm and dry. No rash noted. She is not diaphoretic. No erythema.  Psychiatric: She has a normal mood and affect. Her behavior is normal.  Well groomed, good eye contact, normal speech and thoughts  Nursing note and vitals reviewed.  Results for orders placed or performed in visit on 02/11/17  COMPLETE METABOLIC PANEL WITH GFR  Result Value Ref Range   Sodium 136 135 - 146 mmol/L   Potassium 4.2 3.5 - 5.3 mmol/L   Chloride 98 98 - 110 mmol/L   CO2 29 20 - 31 mmol/L   Glucose, Bld 70 65 - 99 mg/dL   BUN 15 7 - 25 mg/dL   Creat 0.53 (L) 0.60 - 0.88 mg/dL   Total Bilirubin 0.4 0.2 - 1.2 mg/dL   Alkaline Phosphatase 65 33 - 130 U/L   AST 26 10 - 35 U/L   ALT 21 6 - 29 U/L    Total Protein 6.8 6.1 - 8.1 g/dL   Albumin 3.5 (L) 3.6 - 5.1 g/dL   Calcium 8.9 8.6 - 10.4 mg/dL   GFR, Est African American >89 >=60 mL/min   GFR, Est Non African American 88 >=60 mL/min  CBC with Differential  Result Value Ref Range   WBC 3.3 (L) 3.8 - 10.8 K/uL   RBC 4.09 3.80 - 5.10 MIL/uL   Hemoglobin 13.5 11.7 - 15.5 g/dL   HCT 40.4 35.0 - 45.0 %   MCV 98.8 80.0 - 100.0 fL   MCH 33.0 27.0 - 33.0 pg   MCHC 33.4 32.0 - 36.0 g/dL   RDW 13.6 11.0 - 15.0 %   Platelets 242 140 - 400 K/uL   MPV 9.2 7.5 - 12.5 fL   Neutro Abs 2,112 1,500 - 7,800 cells/uL   Lymphs Abs 693 (L) 850 - 3,900 cells/uL   Monocytes Absolute 396 200 - 950 cells/uL   Eosinophils Absolute 66 15 - 500 cells/uL   Basophils Absolute 33 0 - 200 cells/uL   Neutrophils Relative % 64 %   Lymphocytes Relative 21 %   Monocytes Relative 12 %   Eosinophils Relative 2 %   Basophils Relative 1 %  Smear Review Criteria for review not met   Lipid Profile  Result Value Ref Range   Cholesterol 192 <200 mg/dL   Triglycerides 47 <150 mg/dL   HDL 68 >50 mg/dL   Total CHOL/HDL Ratio 2.8 <5.0 Ratio   VLDL 9 <30 mg/dL   LDL Cholesterol 115 (H) <100 mg/dL  TSH  Result Value Ref Range   TSH 4.43 mIU/L      Assessment & Plan:   Problem List Items Addressed This Visit    Lumbar radiculitis   Relevant Medications   gabapentin (NEURONTIN) 100 MG capsule   Lumbar and sacral osteoarthritis    Stable chronic LBP with associated sciatica. Without flare In setting of known chronic LBP with DJD - No red flag symptoms. Negative SLR for radiculopathy - Improved on conservative therapy  Plan: 1. Refilled Gabapentin. Continue Tramadol per Dr Sharlet Salina, continue if needs further interventions 2. Increase dose Tylenol as advised 500-1056m TID PRN 3. Future consider muscle relaxant 4. Encouraged use of heating pad 1-2x daily for now then PRN 5. Follow-up if not improving      Relevant Medications   gabapentin (NEURONTIN) 100  MG capsule   Essential hypertension - Primary    Well-controlled HTN - Home BP readings normal  No known complications   Plan:  1. Continue current BP regimen - Metoprolol 12.549mBID, Furosemide 2031mID 2. Encourage improved lifestyle - low sodium diet, regular exercise 3. Continue monitor BP outside office, bring readings to next visit, if persistently >140/90 or new symptoms notify office sooner. Caution hypotension as well. 4. Follow-up 4 months      Relevant Medications   furosemide (LASIX) 20 MG tablet   Degeneration of intervertebral disc of lumbar region   Relevant Medications   gabapentin (NEURONTIN) 100 MG capsule   Chronic diastolic CHF (congestive heart failure) (HCC)    Stable, euvolemic on exam, dCHF without exacerbation Controlled on med regimen including BB, Lasix +K supplement Followed by Dr GolRockey Siturdiology      Relevant Medications   furosemide (LASIX) 20 MG tablet      Meds ordered this encounter  Medications  . furosemide (LASIX) 20 MG tablet    Sig: Take 1 tablet (20 mg total) by mouth 2 (two) times daily.    Dispense:  180 tablet    Refill:  3  . gabapentin (NEURONTIN) 100 MG capsule    Sig: Take 1 capsule (100 mg total) by mouth 3 (three) times daily.    Dispense:  270 capsule    Refill:  3    Follow up plan: Return in about 4 months (around 10/04/2017) for blood pressure.  AleNobie PutnamO [facility] 06/05/2017, 12:19 AM

## 2017-06-05 NOTE — Assessment & Plan Note (Signed)
Stable chronic LBP with associated sciatica. Without flare In setting of known chronic LBP with DJD - No red flag symptoms. Negative SLR for radiculopathy - Improved on conservative therapy  Plan: 1. Refilled Gabapentin. Continue Tramadol per Dr Sharlet Salina, continue if needs further interventions 2. Increase dose Tylenol as advised 500-1000mg  TID PRN 3. Future consider muscle relaxant 4. Encouraged use of heating pad 1-2x daily for now then PRN 5. Follow-up if not improving

## 2017-06-05 NOTE — Assessment & Plan Note (Signed)
Stable, euvolemic on exam, dCHF without exacerbation Controlled on med regimen including BB, Lasix +K supplement Followed by Dr Rockey Situ Cardiology

## 2017-06-05 NOTE — Assessment & Plan Note (Signed)
Well-controlled HTN - Home BP readings normal  No known complications   Plan:  1. Continue current BP regimen - Metoprolol 12.5mg  BID, Furosemide 20mg  BID 2. Encourage improved lifestyle - low sodium diet, regular exercise 3. Continue monitor BP outside office, bring readings to next visit, if persistently >140/90 or new symptoms notify office sooner. Caution hypotension as well. 4. Follow-up 4 months

## 2017-06-18 ENCOUNTER — Ambulatory Visit: Payer: PPO | Admitting: Family Medicine

## 2017-08-19 ENCOUNTER — Ambulatory Visit (INDEPENDENT_AMBULATORY_CARE_PROVIDER_SITE_OTHER): Payer: PPO | Admitting: Family Medicine

## 2017-08-19 ENCOUNTER — Ambulatory Visit
Admission: RE | Admit: 2017-08-19 | Discharge: 2017-08-19 | Disposition: A | Discharge status: Home / Self Care | Payer: PPO | Source: Ambulatory Visit | Attending: Family Medicine | Admitting: Family Medicine

## 2017-08-19 ENCOUNTER — Encounter: Payer: Self-pay | Admitting: Family Medicine

## 2017-08-19 VITALS — BP 120/48 | HR 72 | Temp 98.4°F | Resp 16 | Ht <= 58 in | Wt 93.6 lb

## 2017-08-19 DIAGNOSIS — M4854XA Collapsed vertebra, not elsewhere classified, thoracic region, initial encounter for fracture: Secondary | ICD-10-CM | POA: Insufficient documentation

## 2017-08-19 DIAGNOSIS — G8929 Other chronic pain: Secondary | ICD-10-CM | POA: Insufficient documentation

## 2017-08-19 DIAGNOSIS — M47817 Spondylosis without myelopathy or radiculopathy, lumbosacral region: Secondary | ICD-10-CM

## 2017-08-19 DIAGNOSIS — K5900 Constipation, unspecified: Secondary | ICD-10-CM | POA: Diagnosis not present

## 2017-08-19 DIAGNOSIS — K5909 Other constipation: Secondary | ICD-10-CM | POA: Diagnosis not present

## 2017-08-19 DIAGNOSIS — M545 Low back pain, unspecified: Secondary | ICD-10-CM

## 2017-08-19 DIAGNOSIS — N3001 Acute cystitis with hematuria: Secondary | ICD-10-CM | POA: Diagnosis not present

## 2017-08-19 DIAGNOSIS — M48062 Spinal stenosis, lumbar region with neurogenic claudication: Secondary | ICD-10-CM

## 2017-08-19 DIAGNOSIS — R350 Frequency of micturition: Secondary | ICD-10-CM | POA: Diagnosis not present

## 2017-08-19 DIAGNOSIS — X58XXXA Exposure to other specified factors, initial encounter: Secondary | ICD-10-CM | POA: Insufficient documentation

## 2017-08-19 LAB — POCT URINALYSIS DIPSTICK
Bilirubin, UA: NEGATIVE
GLUCOSE UA: NEGATIVE
Ketones, UA: NEGATIVE
NITRITE UA: POSITIVE
PH UA: 5 (ref 5.0–8.0)
PROTEIN UA: NEGATIVE
Spec Grav, UA: 1.01 (ref 1.010–1.025)
UROBILINOGEN UA: 0.2 U/dL

## 2017-08-19 MED ORDER — CIPROFLOXACIN HCL 500 MG PO TABS: 500.0000 mg | ORAL_TABLET | Freq: Two times a day (BID) | ORAL | 0 refills | Status: DC

## 2017-08-19 MED ORDER — BACLOFEN 10 MG PO TABS: 5.0000 mg | ORAL_TABLET | Freq: Three times a day (TID) | ORAL | 2 refills | Status: DC | PRN

## 2017-08-19 MED ORDER — POLYETHYLENE GLYCOL 3350 17 GM/SCOOP PO POWD: 17.0000 g | Freq: Every day | ORAL | 2 refills | Status: AC | PRN

## 2017-08-19 NOTE — Progress Notes (Signed)
Subjective:    Patient ID: Jennifer Skinner, female    DOB: [birth year], 83 y.o.   MRN: [id number]  Jennifer Skinner is a 83 y.o. female presenting on 08/19/2017 for Back Pain (pt has constipation and urgency and frequency onset 4 days)  Patient presents for a same day appointment. Accompanied by husband, Rosaria Ferries.  HPI   URINARY FREQUENCY / Subacute on Chronic Low Back Pain / Lumbar DJD Spinal Stenosis - Last visit with me 06/04/17, for initial visit with me for same problem, treated with increased Tylenol, refilled Gabapentin, see prior notes for background information. - Interval update with recent worsening mid to low bilateral back pain over past 2 weeks, recent worse in 4 days, no provoking event or fall or injury, seems to have tight soreness in muscles, also followed by Dr Sharlet Salina in past for medications and injections, next apt 09/2017 - Today patient reports concern with back and also R sided Flank pain - Improved on on Gabapentin and Tramadol - Taking Tylenol 500mg  TID regularly with improvement, concern now tylenol is causing constipation - Admits constipation recently, last week 5 days without BM then took OTC mag citrate with good clean-out result - Admits urinary frequency - Denies dysuria, hematuria, nausea vomiting, fever/chills, cough, dyspnea, loss of bladder/bowel control, incontinence, unintentional wt loss   Social History  Substance Use Topics  . Smoking status: Never Smoker  . Smokeless tobacco: Never Used  . Alcohol use No    Review of Systems Per HPI unless specifically indicated above     Objective:    BP (!) 120/48   Pulse 72   Temp 98.4 F (36.9 C) (Oral)   Resp 16   Ht 4\' 9"  (1.448 m)   Wt 93 lb 9.6 oz (42.5 kg)   SpO2 92%   BMI 20.25 kg/m   Wt Readings from Last 3 Encounters:  08/19/17 93 lb 9.6 oz (42.5 kg)  06/04/17 91 lb 6.4 oz (41.5 kg)  03/19/17 91 lb 6 oz (41.4 kg)    Physical Exam  Constitutional: She is oriented to person, place,  and time. She appears well-developed and well-nourished. No distress.  Chronically ill appearing 83 year old female, thin, slightly uncomfortable due to back, cooperative  HENT:  Head: Normocephalic and atraumatic.  Mouth/Throat: Oropharynx is clear and moist.  Eyes: Conjunctivae are normal. Right eye exhibits no discharge. Left eye exhibits no discharge.  Neck: Normal range of motion. Neck supple. No thyromegaly present.  Cardiovascular: Normal rate, regular rhythm, normal heart sounds and intact distal pulses.   No murmur heard. Pulmonary/Chest: Effort normal and breath sounds normal. No respiratory distress. She has no wheezes. She has no rales.  Abdominal: Soft. She exhibits distension (mild generalized). There is no tenderness. There is no rebound.  Hyperactive bowel sounds  Musculoskeletal: Normal range of motion. She exhibits no edema.  No CVAT  Low Back Inspection: Normal appearance, thin body habitus, with stable increased thoracic kyphosis and loss of lumbar lordosis deformity, symmetrical. Palpation: No tenderness over spinous processes. Bilateral lumbar paraspinal muscles now mild tender with mild hypertonicity/spasm. ROM: Some limited ROM, preserved partial active ROM forward flex / back extension Special Testing: Seated SLR negative for radicular pain bilaterally Strength: Bilateral hip flex/ext 5/5, knee flex/ext 5/5, ankle dorsiflex/plantarflex 5/5 Neurovascular: intact distal sensation to light touch  Lymphadenopathy:    She has no cervical adenopathy.  Neurological: She is alert and oriented to person, place, and time.  Distal sensation intact to light touch  Skin: Skin is warm and dry. No rash noted. She is not diaphoretic. No erythema.  Psychiatric: She has a normal mood and affect. Her behavior is normal.  Well groomed, good eye contact, normal speech and thoughts  Nursing note and vitals reviewed.    Results for orders placed or performed in visit on 08/19/17    POCT urinalysis dipstick  Result Value Ref Range   Color, UA yellow    Clarity, UA cloudy    Glucose, UA negative    Bilirubin, UA negative    Ketones, UA negative    Spec Grav, UA 1.010 1.010 - 1.025   Blood, UA trace    pH, UA 5.0 5.0 - 8.0   Protein, UA negative    Urobilinogen, UA 0.2 0.2 or 1.0 E.U./dL   Nitrite, UA positive    Leukocytes, UA Trace (A) Negative      Assessment & Plan:   Problem List Items Addressed This Visit    Other constipation    Suspected acute on chronic constipation, likely on multiple meds and tramadol, some reduced hydration In past relieved with mag citrate OTC but had excessive loose stools for >1-2 days Concern pain worsening constipation, also could be contributing to current symptoms and abdominal distention, however passing gas. Benign abdomen, not consistent with obstruction or other acute abdominal pathology  Plan: 1. Start Miralax, new rx sent in, more daily regimen 1 cap daily, inc water, and may adjust dose to half vs 1-2 caps daily as needed, titrate to 2-3 soft stools daily 2. May add Colace in future, OTC 3. Check KUB today 4. Follow-up      Relevant Medications   polyethylene glycol powder (GLYCOLAX/MIRALAX) powder   Other Relevant Orders   DG Abd 1 View   Lumbar stenosis with neurogenic claudication   Relevant Medications   baclofen (LIORESAL) 10 MG tablet   Other Relevant Orders   DG Lumbar Spine Complete   Lumbar and sacral osteoarthritis    Subacute on chronic LBP without sciatica. Suspect flare multifactorial decreased activity, constipation, UTI In setting of known chronic LBP with DJD - No red flag symptoms. Negative SLR for radiculopathy - Improved on conservative therapy still  Plan: 1. Continue follow-up with Dr Sharlet Salina, on Gabapentin, Tramadol 2. Increase dose Tylenol as advised to 1000mg  TID regularly for 1-2 weeks then PRN - should not cause constipation (treating with miralax regardless) 3. Add Baclofen  5-10mg  TID PRN 4. Encouraged use of heating pad 1-2x daily for now then PRN 5. Check Lumbar spine X-ray today 6. Follow-up if not improving  Also treating underlying UTI and constipation      Relevant Medications   baclofen (LIORESAL) 10 MG tablet   Other Relevant Orders   DG Lumbar Spine Complete    Other Visit Diagnoses    Chronic midline low back pain without sciatica    -  Primary   Relevant Medications   baclofen (LIORESAL) 10 MG tablet   Other Relevant Orders   DG Lumbar Spine Complete   Urinary frequency       Relevant Orders   POCT urinalysis dipstick (Completed)   Urine Culture   Acute cystitis with hematuria      Clinically consistent with presumed UTI and confirmed on UA. No recent UTIs or abx courses. Some back pain but complicated history of back pain, otherwise no concern for pyelo today (no systemic symptoms, neg fever, n/v).  Plan: 1. UA / micro - positive nitrite, trace leuks, RBC trace  2. Ordered Urine culture 3. Cipro 500mg  BID x 5 days 4. Improve PO hydration 5. RTC if no improvement 1-2 weeks, red flags given to return sooner     Relevant Medications   ciprofloxacin (CIPRO) 500 MG tablet      Meds ordered this encounter  Medications  . polyethylene glycol powder (GLYCOLAX/MIRALAX) powder    Sig: Take 17-34 g by mouth daily as needed.    Dispense:  250 g    Refill:  2  . baclofen (LIORESAL) 10 MG tablet    Sig: Take 0.5-1 tablets (5-10 mg total) by mouth 3 (three) times daily as needed for muscle spasms.    Dispense:  30 each    Refill:  2  . ciprofloxacin (CIPRO) 500 MG tablet    Sig: Take 1 tablet (500 mg total) by mouth 2 (two) times daily. For 5 days    Dispense:  10 tablet    Refill:  0    Follow up plan: Return in about 2 weeks (around 09/02/2017), or if symptoms worsen or fail to improve, for back pain / constipation.  Nobie Putnam, [facility] 08/19/2017, 3:27 PM

## 2017-08-19 NOTE — Assessment & Plan Note (Addendum)
Subacute on chronic LBP without sciatica. Suspect flare multifactorial decreased activity, constipation, UTI In setting of known chronic LBP with DJD - No red flag symptoms. Negative SLR for radiculopathy - Improved on conservative therapy still  Plan: 1. Continue follow-up with Dr Sharlet Salina, on Gabapentin, Tramadol 2. Increase dose Tylenol as advised to 1000mg  TID regularly for 1-2 weeks then PRN - should not cause constipation (treating with miralax regardless) 3. Add Baclofen 5-10mg  TID PRN 4. Encouraged use of heating pad 1-2x daily for now then PRN 5. Check Lumbar spine X-ray today 6. Follow-up if not improving  Also treating underlying UTI and constipation

## 2017-08-19 NOTE — Assessment & Plan Note (Signed)
Suspected acute on chronic constipation, likely on multiple meds and tramadol, some reduced hydration In past relieved with mag citrate OTC but had excessive loose stools for >1-2 days Concern pain worsening constipation, also could be contributing to current symptoms and abdominal distention, however passing gas. Benign abdomen, not consistent with obstruction or other acute abdominal pathology  Plan: 1. Start Miralax, new rx sent in, more daily regimen 1 cap daily, inc water, and may adjust dose to half vs 1-2 caps daily as needed, titrate to 2-3 soft stools daily 2. May add Colace in future, OTC 3. Check KUB today 4. Follow-up

## 2017-08-19 NOTE — Patient Instructions (Addendum)
Thank you for coming to the clinic today.  1. Increase Tylenol to 500mg  (Extra Strength) take 2 of them per dose THREE TIMES a DAY for next few weeks. 2.   Start taking Baclofen (Lioresal) 10mg  (muscle relaxant) - start with half (cut) to one whole pill at night as needed for next 1-3 nights (may make you drowsy, caution with driving) see how it affects you, then if tolerated increase to one pill 2 to 3 times a day or (every 8 hours as needed)  Continue Tramadol as needed  3. Start Miralax for constipation 17-34mg  (1 to 2 capfuls) daily for now, until regular bowel movements  4. Will check Urine for UTI - will call you with antibiotic IF NEEDED, will send urine culture  5. WIll check Back and Abdomen for X-rays today   Please schedule a Follow-up Appointment to: Return in about 2 weeks (around 09/02/2017), or if symptoms worsen or fail to improve, for back pain / constipation.  If you have any other questions or concerns, please feel free to call the clinic or send a message through [place]. You may also schedule an earlier appointment if necessary.  Additionally, you may be receiving a survey about your experience at our clinic within a few days to 1 week by e-mail or mail. We value your feedback.  Nobie Putnam, DO [place]

## 2017-08-23 LAB — URINE CULTURE
MICRO NUMBER:: 80993994
SPECIMEN QUALITY:: ADEQUATE

## 2017-08-26 ENCOUNTER — Ambulatory Visit: Payer: PPO | Attending: Family | Admitting: Family

## 2017-08-26 ENCOUNTER — Encounter: Payer: Self-pay | Admitting: Family

## 2017-08-26 VITALS — BP 130/71 | HR 76 | Resp 22 | Ht <= 58 in | Wt 97.0 lb

## 2017-08-26 DIAGNOSIS — I5032 Chronic diastolic (congestive) heart failure: Secondary | ICD-10-CM | POA: Insufficient documentation

## 2017-08-26 DIAGNOSIS — J410 Simple chronic bronchitis: Secondary | ICD-10-CM

## 2017-08-26 DIAGNOSIS — G8929 Other chronic pain: Secondary | ICD-10-CM | POA: Diagnosis not present

## 2017-08-26 DIAGNOSIS — M810 Age-related osteoporosis without current pathological fracture: Secondary | ICD-10-CM | POA: Diagnosis not present

## 2017-08-26 DIAGNOSIS — I440 Atrioventricular block, first degree: Secondary | ICD-10-CM | POA: Insufficient documentation

## 2017-08-26 DIAGNOSIS — I110 Hypertensive heart disease with heart failure: Secondary | ICD-10-CM | POA: Diagnosis not present

## 2017-08-26 DIAGNOSIS — M5136 Other intervertebral disc degeneration, lumbar region: Secondary | ICD-10-CM | POA: Diagnosis not present

## 2017-08-26 DIAGNOSIS — M545 Low back pain, unspecified: Secondary | ICD-10-CM

## 2017-08-26 DIAGNOSIS — M549 Dorsalgia, unspecified: Secondary | ICD-10-CM | POA: Insufficient documentation

## 2017-08-26 DIAGNOSIS — I10 Essential (primary) hypertension: Secondary | ICD-10-CM

## 2017-08-26 DIAGNOSIS — J449 Chronic obstructive pulmonary disease, unspecified: Secondary | ICD-10-CM | POA: Diagnosis not present

## 2017-08-26 DIAGNOSIS — I340 Nonrheumatic mitral (valve) insufficiency: Secondary | ICD-10-CM | POA: Diagnosis not present

## 2017-08-26 MED ORDER — FUROSEMIDE 20 MG PO TABS: 20.0000 mg | ORAL_TABLET | Freq: Two times a day (BID) | ORAL | 3 refills | Status: DC

## 2017-08-26 NOTE — Patient Instructions (Addendum)
Continue weighing daily and call for an overnight weight gain of > 2 pounds or a weekly weight gain of >5 pounds.  Increase lasix (furosemide) to 40mg  twice daily for the next 3 days. If your weight returns to baseline weight, you may not need the increased dose for the whole 3 days.   During these next 3 days, take an additional potassium tablet daily as well.   Elevate your legs as much as possible.

## 2017-08-26 NOTE — Progress Notes (Signed)
Agree with pharmacist note below.  Physical exam and ROS were done by myself.  Due to increased weight and edema, will increase her furosemide to 40mg  BID for 3 days along with an extra 49meq potassium daily for these 3 days as well. Should her weight drop back to her baseline prior to day 3, she can resume her baseline dose of furosemide/potassium.  Elevate legs as much as possible during the day.  Return here in 1 week for a recheck. Will get a BMP at that time.  Jennifer Price, FNP [facility]    Patient ID: Jennifer Skinner, female    DOB: February 07, 1931, 83 y.o.   MRN: [id number]  HPI  Jennifer Skinner is a 82 y/o female who has a history of MV prolapse, COPD, HTN, GERD and HF with an EF of 60-65% who returns for follow-up today.    Last echo done 10/11/15 with an EF of 60-65% with moderate mitral regurgitation but without aortic stenosis. Mild to moderate TR,  PASP 56mg  Hg.  Was in the ED 03/04/17 for wound recheck of left middle finger. Discharged home. In the ED 03/01/17 due to left middle finger swelling and drainage for 3 days. Purulent area was drained and wound dressed. 3 days of antibiotics were given. Discharged home with follow-up in a few days. Last hospital admission was Oct 2016 for pneumonia and acute HF. Was treated with antibiotics and diuresed.  Returns today for follow-up with chief complaint of edema in lower extremities and abdomen. Denies SOB, or chest pain. Continues to weigh daily and reports 7 pound increase in weight over the last few days. Has chronic back pain due to DDD. Not adding salt to her food.   Past Medical History:  Diagnosis Date  . 1st degree AV block   . Arthritis   . Chronic diastolic CHF (congestive heart failure) ([place])    a. echo 2014: EF 55-60%, nl wall motion, GR1DD, mild MR, moder mitral prolapse, PASP 40 mm Hg; b. echo 09/2015: EF 60-65%, nl wall motion, GR1DD, mod MR, mild to mod TR, PASP 56 mm Hg  . COPD (chronic obstructive pulmonary disease) ([place])   .  Degenerative disc disease, lumbar   . Gastro-esophageal reflux   . Mitral prolapse   . Mitral regurgitation   . Osteoporosis    Past Surgical History:  Procedure Laterality Date  . ABDOMINAL HYSTERECTOMY    . APPENDECTOMY    . DILATION AND CURETTAGE OF UTERUS    . HYSTEROTOMY    . right breast biopsy    . right eye      surgery  . TONSILLECTOMY     Family History  Problem Relation Age of Onset  . CVA Mother 15  . Heart attack Father 52  . Throat cancer Sister   . CAD Brother    Social History  Substance Use Topics  . Smoking status: Never Smoker  . Smokeless tobacco: Never Used  . Alcohol use No   Allergies  Allergen Reactions  . Penicillins    Prior to Admission medications   Medication Sig Start Date End Date Taking? Authorizing Provider  albuterol (PROVENTIL) (2.5 MG/3ML) 0.083% nebulizer solution Take 3 mLs (2.5 mg total) by nebulization every 6 (six) hours as needed for wheezing or shortness of breath. 10/12/15  Yes Epifanio Lesches, MD  Apple Cider Vinegar (APPLE CIDER VINEGAR ULTRA) 600 MG CAPS 2 teaspoons once a day   Yes Historical Provider, MD  aspirin 325 MG tablet Take 325  mg by mouth daily.   Yes Historical Provider, MD  Calcium Gluconate 500 MG CAPS Take 2 capsules by mouth daily.    Yes Historical Provider, MD  furosemide (LASIX) 20 MG tablet Take 1 tablet (20 mg total) by mouth 2 (two) times daily. 03/13/16  Yes Arlis Porta., MD  gabapentin (NEURONTIN) 100 MG capsule Take 1 capsule (100 mg total) by mouth 3 (three) times daily. 11/12/16  Yes Arlis Porta., MD  levothyroxine (SYNTHROID, LEVOTHROID) 25 MCG tablet Take 1 tablet (25 mcg total) by mouth daily before breakfast. Ran out of meds. 02/20/17  Yes Arlis Porta., MD  metoprolol tartrate (LOPRESSOR) 25 MG tablet Take 0.5 tablets (12.5 mg total) by mouth 2 (two) times daily. 03/12/17  Yes Olin Hauser, DO  Multiple Vitamin (MULTI-VITAMIN DAILY PO) Take by mouth daily.   Yes  Historical Provider, MD  mupirocin ointment (BACTROBAN) 2 % Apply 1 application topically 3 (three) times daily. 03/01/17  Yes Lorin Picket, PA-C  Oyster Shell (OYSTER CALCIUM) 500 MG TABS Take 500 mg of elemental calcium by mouth 2 (two) times daily.   Yes Historical Provider, MD  polyethylene glycol powder (GLYCOLAX/MIRALAX) powder Take 0.5 Containers by mouth daily as needed for mild constipation or moderate constipation.  10/13/15  Yes Historical Provider, MD  potassium chloride (K-DUR) 10 MEQ tablet Take 1 tablet (10 mEq total) by mouth 4 (four) times daily. 12/31/16  Yes Minna Merritts, MD  traMADol (ULTRAM) 50 MG tablet Take 1 tablet by mouth every 6 (six) hours as needed for moderate pain.  08/23/14  Yes Historical Provider, MD    Review of Systems  Constitutional: Positive for fatigue. Negative for appetite change.  HENT: Negative for congestion, postnasal drip and sore throat.   Eyes: Negative.   Respiratory: Negative for cough, chest tightness and shortness of breath.   Cardiovascular: Positive for leg swelling. Negative for chest pain and palpitations.  Gastrointestinal: Positive for abdominal distention. Negative for abdominal pain.  Endocrine: Negative.   Genitourinary: Negative.   Musculoskeletal: Positive for back pain. Negative for neck pain.  Skin: Negative.   Allergic/Immunologic: Negative.   Neurological: Positive for dizziness (infrequent). Negative for light-headedness.  Hematological: Negative for adenopathy. Does not bruise/bleed easily.  Psychiatric/Behavioral: Negative for dysphoric mood, sleep disturbance (sleeping on 1 pillow) and suicidal ideas. The patient is not nervous/anxious.    Vitals:   08/26/17 1138  BP: 130/71  Pulse: 76  Resp: (!) 22  SpO2: 90%  Weight: 97 lb (44 kg)  Height: 4\' 9"  (1.448 m)   Wt Readings from Last 3 Encounters:  08/26/17 97 lb (44 kg)  08/19/17 93 lb 9.6 oz (42.5 kg)  06/04/17 91 lb 6.4 oz (41.5 kg)   Lab Results   Component Value Date   CREATININE 0.53 (L) 02/21/2017   CREATININE 0.47 (L) 10/11/2016   CREATININE 0.39 (L) 12/02/2015    Physical Exam  Constitutional: She is oriented to person, place, and time. She appears well-developed and well-nourished.  HENT:  Head: Normocephalic and atraumatic.  Neck: Normal range of motion. Neck supple. No JVD present.  Cardiovascular: Normal rate and regular rhythm.   Pulmonary/Chest: Effort normal. She has no wheezes. She has no rales.  Abdominal: Soft. She exhibits no distension. There is no tenderness.  Musculoskeletal: She exhibits edema (2+ pitting edema in bilateral lower legs). She exhibits no tenderness.  Neurological: She is alert and oriented to person, place, and time.  Skin: Skin is  warm and dry.  Psychiatric: She has a normal mood and affect. Her behavior is normal. Thought content normal.  Nursing note and vitals reviewed.    Assessment & Plan:  1: Chronic heart failure with preserved ejection fraction- - NYHA Class II - Not adding salt to her food/reading food labels some - Continues to weigh daily. Reminded to call for an overnight weight gain of >2 pounds or weekly weight gain of >5 pounds. - Due to increased edema, increase Lasix to 40mg  twice daily for three days and add an additional KCl 67mEq every day for the next 3 days. NP instructed patient if weight returns to baseline before 3 days, return to baseline doses.  - Saw cardiologist Rockey Situ) on 10/11/16  2: HTN- - BP today 130/83mmHg  - Continue lopressor 12.5mg  twice daily - Saw PCP on 08/19/17 and has follow up on 10/08/2017  3: COPD- - Stable at this time - Continues with nebulizer as needed  4. Chronic back pain-  - Taking APAP 500mg  three times day for 1-2 weeks per Dr. Parks Ranger.  - Continue on baclofen, gabapentin and tramadol   Return here in 1 week or sooner for any questions/problems before then.    Candelaria Stagers, PharmD  Pharmacy Resident  08/26/17

## 2017-09-02 ENCOUNTER — Ambulatory Visit: Payer: PPO | Attending: Family | Admitting: Family

## 2017-09-02 ENCOUNTER — Encounter: Payer: Self-pay | Admitting: Family

## 2017-09-02 VITALS — BP 126/59 | HR 75 | Resp 18 | Ht <= 58 in | Wt 93.2 lb

## 2017-09-02 DIAGNOSIS — I10 Essential (primary) hypertension: Secondary | ICD-10-CM

## 2017-09-02 DIAGNOSIS — M549 Dorsalgia, unspecified: Secondary | ICD-10-CM | POA: Insufficient documentation

## 2017-09-02 DIAGNOSIS — M545 Low back pain: Secondary | ICD-10-CM

## 2017-09-02 DIAGNOSIS — J410 Simple chronic bronchitis: Secondary | ICD-10-CM

## 2017-09-02 DIAGNOSIS — I5032 Chronic diastolic (congestive) heart failure: Secondary | ICD-10-CM | POA: Diagnosis not present

## 2017-09-02 DIAGNOSIS — G8929 Other chronic pain: Secondary | ICD-10-CM

## 2017-09-02 DIAGNOSIS — I110 Hypertensive heart disease with heart failure: Secondary | ICD-10-CM | POA: Insufficient documentation

## 2017-09-02 DIAGNOSIS — Z823 Family history of stroke: Secondary | ICD-10-CM | POA: Diagnosis not present

## 2017-09-02 DIAGNOSIS — Z7982 Long term (current) use of aspirin: Secondary | ICD-10-CM | POA: Diagnosis not present

## 2017-09-02 DIAGNOSIS — Z79899 Other long term (current) drug therapy: Secondary | ICD-10-CM | POA: Diagnosis not present

## 2017-09-02 DIAGNOSIS — Z880 Allergy status to penicillin: Secondary | ICD-10-CM | POA: Insufficient documentation

## 2017-09-02 DIAGNOSIS — Z808 Family history of malignant neoplasm of other organs or systems: Secondary | ICD-10-CM | POA: Insufficient documentation

## 2017-09-02 DIAGNOSIS — K219 Gastro-esophageal reflux disease without esophagitis: Secondary | ICD-10-CM | POA: Diagnosis not present

## 2017-09-02 DIAGNOSIS — M1990 Unspecified osteoarthritis, unspecified site: Secondary | ICD-10-CM | POA: Diagnosis not present

## 2017-09-02 DIAGNOSIS — Z8249 Family history of ischemic heart disease and other diseases of the circulatory system: Secondary | ICD-10-CM | POA: Insufficient documentation

## 2017-09-02 DIAGNOSIS — Z90710 Acquired absence of both cervix and uterus: Secondary | ICD-10-CM | POA: Diagnosis not present

## 2017-09-02 DIAGNOSIS — J449 Chronic obstructive pulmonary disease, unspecified: Secondary | ICD-10-CM | POA: Diagnosis not present

## 2017-09-02 LAB — BASIC METABOLIC PANEL
ANION GAP: 9 (ref 5–15)
BUN: 13 mg/dL (ref 6–20)
CALCIUM: 8.5 mg/dL — AB (ref 8.9–10.3)
CO2: 28 mmol/L (ref 22–32)
Chloride: 89 mmol/L — ABNORMAL LOW (ref 101–111)
GLUCOSE: 85 mg/dL (ref 65–99)
Potassium: 4.5 mmol/L (ref 3.5–5.1)
Sodium: 126 mmol/L — ABNORMAL LOW (ref 135–145)

## 2017-09-02 NOTE — Patient Instructions (Signed)
Continue weighing daily and call for an overnight weight gain of > 2 pounds or a weekly weight gain of >5 pounds. 

## 2017-09-02 NOTE — Progress Notes (Signed)
Patient ID: Jennifer Skinner, female    DOB: 09-06-1931, 83 y.o.   MRN: [id number]  HPI  Jennifer Skinner is Skinner 83 y/o female who has Skinner history of MV prolapse, COPD, HTN, GERD and HF with an EF of 60-65% who returns for follow-up today.    Last echo done 10/11/15 with an EF of 60-65% with moderate mitral regurgitation but without aortic stenosis. Mild to moderate TR,  PASP 56mg  Hg.  Was in the ED 03/04/17 for wound recheck of left middle finger. Discharged home. In the ED 03/01/17 due to left middle finger swelling and drainage for 3 days. Purulent area was drained and wound dressed. 3 days of antibiotics were given. Discharged home with follow-up in Skinner few days. Last hospital admission was Oct 2016 for pneumonia and acute HF. Was treated with antibiotics and diuresed.  Returns today for follow-up with chief complaint of mild fatigue upon moderate exertion. She describes this as chronic in nature having been present for several years with varying levels of severity. She has associated shortness of breath, edema and chronic back pain along with this. She denies any chest pain, palpitations, dizziness or weight gain.   Past Medical History:  Diagnosis Date  . 1st degree AV block   . Arthritis   . Chronic diastolic CHF (congestive heart failure) ([place])    Skinner. echo 2014: EF 55-60%, nl wall motion, GR1DD, mild MR, moder mitral prolapse, PASP 40 mm Hg; b. echo 09/2015: EF 60-65%, nl wall motion, GR1DD, mod MR, mild to mod TR, PASP 56 mm Hg  . COPD (chronic obstructive pulmonary disease) ([place])   . Degenerative disc disease, lumbar   . Gastro-esophageal reflux   . Mitral prolapse   . Mitral regurgitation   . Osteoporosis    Past Surgical History:  Procedure Laterality Date  . ABDOMINAL HYSTERECTOMY    . APPENDECTOMY    . DILATION AND CURETTAGE OF UTERUS    . HYSTEROTOMY    . right breast biopsy    . right eye      surgery  . TONSILLECTOMY     Family History  Problem Relation Age of Onset  . CVA Mother  57  . Heart attack Father 49  . Throat cancer Sister   . CAD Brother    Social History  Substance Use Topics  . Smoking status: Never Smoker  . Smokeless tobacco: Never Used  . Alcohol use No   Allergies  Allergen Reactions  . Penicillins    Prior to Admission medications   Medication Sig Start Date End Date Taking? Authorizing Provider  acetaminophen (TYLENOL) 500 MG tablet Take 500 mg by mouth every 6 (six) hours as needed.   Yes [provider]  albuterol (PROVENTIL) (2.5 MG/3ML) 0.083% nebulizer solution Take 3 mLs (2.5 mg total) by nebulization every 6 (six) hours as needed for wheezing or shortness of breath. 10/12/15  Yes Epifanio Lesches, MD  Apple Cider Vinegar (APPLE CIDER VINEGAR ULTRA) 600 MG CAPS 2 teaspoons once Skinner day   Yes [provider]  aspirin 325 MG tablet Take 325 mg by mouth daily.   Yes [provider]  baclofen (LIORESAL) 10 MG tablet Take 0.5-1 tablets (5-10 mg total) by mouth 3 (three) times daily as needed for muscle spasms. 08/19/17  Yes Karamalegos, Devonne Doughty, DO  Calcium Gluconate 500 MG CAPS Take 2 capsules by mouth daily.    Yes [provider]  furosemide (LASIX) 20 MG tablet Take 1 tablet (  20 mg total) by mouth 2 (two) times daily. 08/26/17  Yes Jennifer Price A, FNP  gabapentin (NEURONTIN) 100 MG capsule Take 1 capsule (100 mg total) by mouth 3 (three) times daily. 06/04/17  Yes Karamalegos, Devonne Doughty, DO  levothyroxine (SYNTHROID, LEVOTHROID) 25 MCG tablet Take 1 tablet (25 mcg total) by mouth daily before breakfast. Ran out of meds. 02/20/17  Yes Arlis Porta., MD  metoprolol tartrate (LOPRESSOR) 25 MG tablet Take 0.5 tablets (12.5 mg total) by mouth 2 (two) times daily. 03/12/17  Yes Karamalegos, Devonne Doughty, DO  Multiple Vitamin (MULTI-VITAMIN DAILY PO) Take by mouth daily.   Yes [provider]  [name] (OYSTER CALCIUM) 500 MG TABS Take 500 mg of elemental calcium by mouth 2 (two) times daily.    Yes [provider]  polyethylene glycol powder (GLYCOLAX/MIRALAX) powder Take 17-34 g by mouth daily as needed. 08/19/17  Yes Karamalegos, Devonne Doughty, DO  potassium chloride (K-DUR) 10 MEQ tablet Take 1 tablet (10 mEq total) by mouth 4 (four) times daily. 12/31/16  Yes Gollan, Kathlene November, MD  traMADol (ULTRAM) 50 MG tablet Take 1 tablet by mouth every 6 (six) hours as needed for moderate pain.  08/23/14  Yes [provider]    Review of Systems  Constitutional: Positive for fatigue. Negative for appetite change.  HENT: Negative for congestion, postnasal drip and sore throat.   Eyes: Negative.   Respiratory: Positive for shortness of breath. Negative for chest tightness.   Cardiovascular: Positive for leg swelling (minimal). Negative for chest pain and palpitations.  Gastrointestinal: Negative for abdominal distention and abdominal pain.  Endocrine: Negative.   Genitourinary: Negative.   Musculoskeletal: Positive for back pain.  Skin: Negative.   Allergic/Immunologic: Negative.   Neurological: Negative for dizziness and light-headedness.  Hematological: Negative for adenopathy. Does not bruise/bleed easily.  Psychiatric/Behavioral: Negative for dysphoric mood and sleep disturbance (sleeping on 1 pillow). The patient is not nervous/anxious.    Vitals:   09/02/17 1045  BP: (!) 126/59  Pulse: 75  Resp: 18  SpO2: 91%  Weight: 93 lb 4 oz (42.3 kg)  Height: 4\' 9"  (1.448 m)   Wt Readings from Last 3 Encounters:  09/02/17 93 lb 4 oz (42.3 kg)  08/26/17 97 lb (44 kg)  08/19/17 93 lb 9.6 oz (42.5 kg)   Lab Results  Component Value Date   CREATININE <0.30 (L) 09/02/2017   CREATININE 0.53 (L) 02/21/2017   CREATININE 0.47 (L) 10/11/2016    Physical Exam  Constitutional: She is oriented to person, place, and time. She appears well-developed and well-nourished.  HENT:  Head: Normocephalic and atraumatic.  Neck: Normal range of motion. Neck supple. No JVD present.   Cardiovascular: Normal rate and regular rhythm.   Pulmonary/Chest: Effort normal. She has no wheezes. She has no rales.  Abdominal: Soft. She exhibits no distension. There is no tenderness.  Musculoskeletal: She exhibits edema (trace edema around bilateral ankles). She exhibits no tenderness.  Neurological: She is alert and oriented to person, place, and time.  Skin: Skin is warm and dry.  Psychiatric: She has Skinner normal mood and affect. Her behavior is normal. Thought content normal.  Nursing note and vitals reviewed.   Assessment & Plan:  1: Chronic heart failure with preserved ejection fraction- - NYHA Class II - euvolemic today - Not adding salt to her food/reading food labels some - Continues to weigh daily. Reminded to call for an overnight weight gain of >2 pounds or weekly weight  gain of >5 pounds; weight down 4 pounds from visit last week - took extra diuretic as prescribed; edema down to almost normal - BMP drawn today - Saw cardiologist Rockey Situ) on 10/11/16 - need to discuss ordering echo with her at her next clinic visit  2: HTN- - BP looks good today - Continue lopressor 12.5mg  twice daily - Saw PCP on 08/19/17 and has follow up on 10/08/2017  3: COPD- - Stable at this time - Continues with nebulizer as needed  4. Chronic back pain-  - Taking APAP 500mg  three times day for 1-2 weeks per Dr. Parks Ranger.  - Continue on baclofen, gabapentin and tramadol   Patient did not bring her medications nor Skinner list. Each medication was verbally reviewed with the patient and she was encouraged to bring the bottles to every visit to confirm accuracy of list.  Return in 3 months or sooner for any questions/problems before then.

## 2017-09-03 ENCOUNTER — Telehealth: Payer: Self-pay | Admitting: Family

## 2017-09-03 DIAGNOSIS — E871 Hypo-osmolality and hyponatremia: Secondary | ICD-10-CM

## 2017-09-03 NOTE — Telephone Encounter (Signed)
Spoke with patient regarding lab work that was drawn yesterday (09/02/17). Potassium and renal function are normal. Sodium level is low at 126 after taking additional diuretics. No weakness.   Will recheck her lab work in one week. Previous sodium levels have been good. She is now taking her normal dose of 20mg  furosemide twice daily. Future order for labs placed today for next week.

## 2017-09-05 ENCOUNTER — Telehealth: Payer: Self-pay | Admitting: Family Medicine

## 2017-09-05 ENCOUNTER — Telehealth: Payer: Self-pay | Admitting: Family

## 2017-09-05 ENCOUNTER — Ambulatory Visit
Admission: EM | Admit: 2017-09-05 | Discharge: 2017-09-05 | Disposition: A | Discharge status: Home / Self Care | Payer: PPO | Attending: Family Medicine | Admitting: Family Medicine

## 2017-09-05 ENCOUNTER — Encounter: Payer: Self-pay | Admitting: *Deleted

## 2017-09-05 DIAGNOSIS — N3281 Overactive bladder: Secondary | ICD-10-CM

## 2017-09-05 DIAGNOSIS — N3001 Acute cystitis with hematuria: Secondary | ICD-10-CM | POA: Diagnosis not present

## 2017-09-05 DIAGNOSIS — R319 Hematuria, unspecified: Secondary | ICD-10-CM

## 2017-09-05 DIAGNOSIS — R300 Dysuria: Secondary | ICD-10-CM

## 2017-09-05 DIAGNOSIS — R3915 Urgency of urination: Secondary | ICD-10-CM

## 2017-09-05 LAB — URINALYSIS, COMPLETE (UACMP) WITH MICROSCOPIC
BILIRUBIN URINE: NEGATIVE
Glucose, UA: NEGATIVE mg/dL
KETONES UR: NEGATIVE mg/dL
NITRITE: NEGATIVE
Protein, ur: 30 mg/dL — AB
SPECIFIC GRAVITY, URINE: 1.015 (ref 1.005–1.030)
pH: 5.5 (ref 5.0–8.0)

## 2017-09-05 MED ORDER — MIRABEGRON ER 25 MG PO TB24: 25.0000 mg | ORAL_TABLET | Freq: Every day | ORAL | 2 refills | Status: DC

## 2017-09-05 MED ORDER — NITROFURANTOIN MONOHYD MACRO 100 MG PO CAPS: 100.0000 mg | ORAL_CAPSULE | Freq: Two times a day (BID) | ORAL | 0 refills | Status: DC

## 2017-09-05 NOTE — Telephone Encounter (Signed)
Duplicate note entered in error

## 2017-09-05 NOTE — ED Provider Notes (Signed)
[facility]    CSN: [id number] Arrival date & time: 09/05/17  1739     History   Chief Complaint Chief Complaint  Patient presents with  . Hematuria    HPI CARISMA TROUPE is a 83 y.o. female.   Patient is a 83 year old white female who comes in because of blood and urine. Apparently this morning she started having blood in your urine and abdominal pain when she urinates. She states is no pain when she is not urinating other than her chronic back pain that she has which is of systems lobe worse than his normal meds. The son who brings her comes with her has had kidney stones before and she states that she seen him wiggling and pain and this discomfort is nothing like that. No fever. She does have degenerative disc disease COPD and first-degree block and other cardiac issues. She's had abdominal hysterectomy appendectomy D&C does have a right breast biopsy right eye surgery and tonsillectomy. She called her PCP who can see her today because parents start her on a bladder spasm medication in case she was having an overactive bladder. I'm not sure whether she was having blood at the time in her urine.   The history is provided by the patient. No language interpreter was used.  Hematuria  This is a new problem. The current episode started 6 to 12 hours ago. The problem has not changed since onset.Associated symptoms include abdominal pain. Pertinent negatives include no chest pain, no headaches and no shortness of breath. Nothing aggravates the symptoms. Nothing relieves the symptoms. She has tried nothing for the symptoms. The treatment provided no relief.    Past Medical History:  Diagnosis Date  . 1st degree AV block   . Arthritis   . Chronic diastolic CHF (congestive heart failure) ([place])    a. echo 2014: EF 55-60%, nl wall motion, GR1DD, mild MR, moder mitral prolapse, PASP 40 mm Hg; b. echo 09/2015: EF 60-65%, nl wall motion, GR1DD, mod MR, mild to mod TR, PASP 56 mm Hg    . COPD (chronic obstructive pulmonary disease) ([place])   . Degenerative disc disease, lumbar   . Gastro-esophageal reflux   . Mitral prolapse   . Mitral regurgitation   . Osteoporosis     Patient Active Problem List   Diagnosis Date Noted  . Chronic back pain 08/26/2017  . Other constipation 08/19/2017  . Essential hypertension 03/13/2016  . COPD (chronic obstructive pulmonary disease) ([place])   . Chronic diastolic CHF (congestive heart failure) ([place])   . 1st degree AV block   . Interstitial lung disease ([place]) 10/17/2015  . Hypothyroidism 09/27/2015  . Lumbar and sacral osteoarthritis 03/11/2015  . Degeneration of intervertebral disc of lumbar region 07/13/2014  . Lumbar radiculitis 07/13/2014  . Lumbar stenosis with neurogenic claudication 07/13/2014  . History of neutropenia 03/31/2013  . ION (ischemic optic neuropathy) 09/30/2012  . OP (osteoporosis) 09/30/2012  . MITRAL REGURGITATION 04/12/2009  . Mitral valve disorder 04/12/2009  . GERD 04/12/2009    Past Surgical History:  Procedure Laterality Date  . ABDOMINAL HYSTERECTOMY    . APPENDECTOMY    . DILATION AND CURETTAGE OF UTERUS    . HYSTEROTOMY    . right breast biopsy    . right eye      surgery  . TONSILLECTOMY      OB History    No data available       Home Medications    Prior to Admission  medications   Medication Sig Start Date End Date Taking? Authorizing Provider  acetaminophen (TYLENOL) 500 MG tablet Take 500 mg by mouth every 6 (six) hours as needed.   Yes [provider]  Apple Cider Vinegar (APPLE CIDER VINEGAR ULTRA) 600 MG CAPS 2 teaspoons once a day   Yes [provider]  aspirin 325 MG tablet Take 325 mg by mouth daily.   Yes [provider]  Calcium Gluconate 500 MG CAPS Take 2 capsules by mouth daily.    Yes [provider]  furosemide (LASIX) 20 MG tablet Take 1 tablet (20 mg total) by mouth 2 (two) times daily. 08/26/17  Yes Darylene Price A, FNP   gabapentin (NEURONTIN) 100 MG capsule Take 1 capsule (100 mg total) by mouth 3 (three) times daily. 06/04/17  Yes Karamalegos, Devonne Doughty, DO  levothyroxine (SYNTHROID, LEVOTHROID) 25 MCG tablet Take 1 tablet (25 mcg total) by mouth daily before breakfast. Ran out of meds. 02/20/17  Yes Arlis Porta., MD  metoprolol tartrate (LOPRESSOR) 25 MG tablet Take 0.5 tablets (12.5 mg total) by mouth 2 (two) times daily. 03/12/17  Yes Karamalegos, Devonne Doughty, DO  Multiple Vitamin (MULTI-VITAMIN DAILY PO) Take by mouth daily.   Yes [provider]  [name] (OYSTER CALCIUM) 500 MG TABS Take 500 mg of elemental calcium by mouth 2 (two) times daily.   Yes [provider]  polyethylene glycol powder (GLYCOLAX/MIRALAX) powder Take 17-34 g by mouth daily as needed. 08/19/17  Yes Karamalegos, Devonne Doughty, DO  potassium chloride (K-DUR) 10 MEQ tablet Take 1 tablet (10 mEq total) by mouth 4 (four) times daily. 12/31/16  Yes Gollan, Kathlene November, MD  traMADol (ULTRAM) 50 MG tablet Take 1 tablet by mouth every 6 (six) hours as needed for moderate pain.  08/23/14  Yes [provider]  albuterol (PROVENTIL) (2.5 MG/3ML) 0.083% nebulizer solution Take 3 mLs (2.5 mg total) by nebulization every 6 (six) hours as needed for wheezing or shortness of breath. 10/12/15   Epifanio Lesches, MD  baclofen (LIORESAL) 10 MG tablet Take 0.5-1 tablets (5-10 mg total) by mouth 3 (three) times daily as needed for muscle spasms. 08/19/17   Karamalegos, Devonne Doughty, DO  mirabegron ER (MYRBETRIQ) 25 MG TB24 tablet Take 1 tablet (25 mg total) by mouth daily. 09/05/17   Karamalegos, Devonne Doughty, DO  nitrofurantoin, macrocrystal-monohydrate, (MACROBID) 100 MG capsule Take 1 capsule (100 mg total) by mouth 2 (two) times daily. 09/05/17   Frederich Cha, MD    Family History Family History  Problem Relation Age of Onset  . CVA Mother 24  . Heart attack Father 6  . Throat cancer Sister   . CAD Brother     Social  History Social History  Substance Use Topics  . Smoking status: Never Smoker  . Smokeless tobacco: Never Used  . Alcohol use No     Allergies   Penicillins   Review of Systems Review of Systems  Respiratory: Negative for shortness of breath.   Cardiovascular: Negative for chest pain.  Gastrointestinal: Positive for abdominal pain.  Genitourinary: Positive for hematuria.  Neurological: Negative for headaches.  All other systems reviewed and are negative.    Physical Exam Triage Vital Signs ED Triage Vitals  Enc Vitals Group     BP 09/05/17 1759 118/60     Pulse Rate 09/05/17 1759 90     Resp 09/05/17 1759 16     Temp 09/05/17 1759 98.4 F (36.9 C)  Temp Source 09/05/17 1759 Oral     SpO2 09/05/17 1759 92 %     Weight 09/05/17 1753 93 lb (42.2 kg)     Height 09/05/17 1753 4\' 9"  (1.448 m)     Head Circumference --      Peak Flow --      Pain Score 09/05/17 1846 0     Pain Loc --      Pain Edu? --      Excl. in [place]? --    No data found.   Updated Vital Signs BP 118/60 (BP Location: Left Arm)   Pulse 90   Temp 98.4 F (36.9 C) (Oral)   Resp 16   Ht 4\' 9"  (1.448 m)   Wt 93 lb (42.2 kg)   SpO2 92%   BMI 20.13 kg/m   Visual Acuity Right Eye Distance:   Left Eye Distance:   Bilateral Distance:    Right Eye Near:   Left Eye Near:    Bilateral Near:     Physical Exam  Constitutional: She is oriented to person, place, and time. She appears cachectic.  Non-toxic appearance. She does not have a sickly appearance. She does not appear ill.  HENT:  Head: Normocephalic and atraumatic.  Eyes: Pupils are equal, round, and reactive to light. EOM are normal.  Neck: Neck supple.  Pulmonary/Chest: Effort normal.  Abdominal: Soft.  Musculoskeletal: Normal range of motion.  Neurological: She is alert and oriented to person, place, and time.  Skin: Skin is warm.  Psychiatric: She has a normal mood and affect.  Vitals reviewed.    UC Treatments / Results    Labs (all labs ordered are listed, but only abnormal results are displayed) Labs Reviewed  URINALYSIS, COMPLETE (UACMP) WITH MICROSCOPIC - Abnormal; Notable for the following:       Result Value   Color, Urine PINK (*)    APPearance CLOUDY (*)    Hgb urine dipstick LARGE (*)    Protein, ur 30 (*)    Leukocytes, UA MODERATE (*)    Squamous Epithelial / LPF 0-5 (*)    Bacteria, UA FEW (*)    All other components within normal limits  URINE CULTURE    EKG  EKG Interpretation None       Radiology No results found.  Procedures Procedures (including critical care time)  Medications Ordered in UC Medications - No data to display   Initial Impression / Assessment and Plan / UC Course  I have reviewed the triage vital signs and the nursing notes.  Pertinent labs & imaging results that were available during my care of the patient were reviewed by me and considered in my medical decision making (see chart for details).     Since she is on a anti-spasmodic from her PCP going to hold off on the Pyridium and also give her a low dose 100 mg. Urine is grossly abnormal we'll place on Macrobid 100 mg 1 capsule twice a day for 7 days follow-up with PCP in 2-3 weeks as needed.  Final Clinical Impressions(s) / UC Diagnoses   Final diagnoses:  Hematuria, unspecified type  Dysuria  Acute cystitis with hematuria    New Prescriptions Discharge Medication List as of 09/05/2017  6:40 PM    START taking these medications   Details  nitrofurantoin, macrocrystal-monohydrate, (MACROBID) 100 MG capsule Take 1 capsule (100 mg total) by mouth 2 (two) times daily., Starting Thu 09/05/2017, Normal        Note:  This dictation was prepared with Dragon dictation along with smaller phrase technology. Any transcriptional errors that result from this process are unintentional. Controlled Substance Prescriptions [place] Controlled Substance Registry consulted? Not Applicable   Frederich Cha,  MD 09/05/17 [phone]

## 2017-09-05 NOTE — Telephone Encounter (Signed)
Patient called to say that she's been having to urinate frequently today ( ~15 minutes or so). She's having to wear a pad in her underwear due to incontinence.  Denies any burning, pain or blood in her urine; just frequency.  Advised her to call her PCP as they might want to check a urine sample for a possible UTI. Patient says that she will call them immediately.   She is planning on getting her lab work rechecked on 09/10/17 due to previously low sodium after taking additional diuretics last week.

## 2017-09-05 NOTE — ED Triage Notes (Signed)
Pt noted blood to urine today.

## 2017-09-05 NOTE — Telephone Encounter (Signed)
Called patient back today, 9/27, reviewed note from Cardiology as she called them earlier today, asking about her diuretic. Last office visit with me 9/10, see note for details, discussed similar urinary frequency seems to be worse now, also reviewed other potential causes such as lumbar spine and neurological etiology vs UTI. She was tested and culture positive for Enterococcus UTI at that time, treated with Cipro empirically and continued this med since she reported improvement, intermediate sensitivity.  Now she describes persistent urinary frequency more recently, seems every 30 min or less, at times. Denies any urinary burning, hematuria, fevers/chills. Also denies any urinary incontinence or leakage. Her back pain is improved, no numbness or weakness of legs or saddle region. Her bowels are improved, no fecal incontinence either. She has never seen Urologist, has not had urinary testing. Describes more "urgency" but can hold urine and does not have significant incontinence.  Discussed that this is difficult to diagnose over the phone, but since we just saw her recently, we can try to manage with this information. I do not think she has another UTI, but it is possible. Will hold off empiric antibiotics for now. Instead symptoms sound most consistent of flare of OAB, presumed dx without urodynamic testing. Will trial empiric Myrbetriq for urinary symptoms for now, and if improved can continue for 4 weeks until return to see me in October. Otherwise if not improved or worsening should return sooner within 1 week, or if weekend/after hours or emergency criteria reviewed she needs to go to hospital ED or urgent care to re-check urine.  Jennifer Skinner, [facility] 09/05/2017, 1:27 PM

## 2017-09-05 NOTE — Telephone Encounter (Deleted)
Patient called to say that today she's been having to urinate every 15 minutes or so. She's also endorsing some urinary incontinence and is having to wear a pad in her underwear.  Denies any pain, burning or blood in her urine; just the frequency.  Advised her to call her PCP as they may want to check a urine specimen for a possible UTI. She says that she will call them immediately. She is planning on coming in on 09/10/17 to recheck her lab work as her sodium level was low after taking additional diuretics.

## 2017-09-06 ENCOUNTER — Ambulatory Visit: Payer: PPO | Admitting: Nurse Practitioner

## 2017-09-08 LAB — URINE CULTURE

## 2017-09-10 ENCOUNTER — Telehealth: Payer: Self-pay | Admitting: Family

## 2017-09-10 ENCOUNTER — Other Ambulatory Visit
Admission: RE | Admit: 2017-09-10 | Discharge: 2017-09-10 | Disposition: A | Discharge status: Home / Self Care | Payer: PPO | Source: Ambulatory Visit | Attending: Family | Admitting: Family

## 2017-09-10 DIAGNOSIS — E871 Hypo-osmolality and hyponatremia: Secondary | ICD-10-CM | POA: Insufficient documentation

## 2017-09-10 LAB — BASIC METABOLIC PANEL
Anion gap: 8 (ref 5–15)
BUN: 13 mg/dL (ref 6–20)
CHLORIDE: 93 mmol/L — AB (ref 101–111)
CO2: 27 mmol/L (ref 22–32)
CREATININE: 0.39 mg/dL — AB (ref 0.44–1.00)
Calcium: 8.7 mg/dL — ABNORMAL LOW (ref 8.9–10.3)
Glucose, Bld: 81 mg/dL (ref 65–99)
Potassium: 4.6 mmol/L (ref 3.5–5.1)
SODIUM: 128 mmol/L — AB (ref 135–145)

## 2017-09-10 NOTE — Telephone Encounter (Signed)
LM on voicemail regarding lab work that was drawn on 09/10/17. Potassium and renal function are good. Sodium remains a little low but is improving.   Will plan on rechecking at her next appointment.

## 2017-09-17 DIAGNOSIS — D1730 Benign lipomatous neoplasm of skin and subcutaneous tissue of unspecified sites: Secondary | ICD-10-CM | POA: Diagnosis not present

## 2017-09-18 ENCOUNTER — Ambulatory Visit: Payer: PPO | Admitting: Family

## 2017-09-23 DIAGNOSIS — M5416 Radiculopathy, lumbar region: Secondary | ICD-10-CM | POA: Diagnosis not present

## 2017-09-23 DIAGNOSIS — M5136 Other intervertebral disc degeneration, lumbar region: Secondary | ICD-10-CM | POA: Diagnosis not present

## 2017-09-23 DIAGNOSIS — M48062 Spinal stenosis, lumbar region with neurogenic claudication: Secondary | ICD-10-CM | POA: Diagnosis not present

## 2017-10-08 ENCOUNTER — Other Ambulatory Visit: Payer: Self-pay | Admitting: Family Medicine

## 2017-10-08 ENCOUNTER — Encounter: Payer: Self-pay | Admitting: Family Medicine

## 2017-10-08 ENCOUNTER — Ambulatory Visit (INDEPENDENT_AMBULATORY_CARE_PROVIDER_SITE_OTHER): Payer: PPO | Admitting: Family Medicine

## 2017-10-08 VITALS — BP 112/58 | HR 68 | Temp 98.0°F | Resp 16 | Ht <= 58 in | Wt 97.0 lb

## 2017-10-08 DIAGNOSIS — E034 Atrophy of thyroid (acquired): Secondary | ICD-10-CM

## 2017-10-08 DIAGNOSIS — E785 Hyperlipidemia, unspecified: Secondary | ICD-10-CM | POA: Insufficient documentation

## 2017-10-08 DIAGNOSIS — I5032 Chronic diastolic (congestive) heart failure: Secondary | ICD-10-CM

## 2017-10-08 DIAGNOSIS — I10 Essential (primary) hypertension: Secondary | ICD-10-CM

## 2017-10-08 DIAGNOSIS — R799 Abnormal finding of blood chemistry, unspecified: Secondary | ICD-10-CM

## 2017-10-08 DIAGNOSIS — Z862 Personal history of diseases of the blood and blood-forming organs and certain disorders involving the immune mechanism: Secondary | ICD-10-CM

## 2017-10-08 DIAGNOSIS — E7800 Pure hypercholesterolemia, unspecified: Secondary | ICD-10-CM

## 2017-10-08 NOTE — Progress Notes (Signed)
Subjective:    Patient ID: Jennifer Skinner, female    DOB: [birth year], 83 y.o.   MRN: [id number]  Jennifer Skinner is a 83 y.o. female presenting on 10/08/2017 for Hypertension  Patient accompanied by her husband, Rosaria Ferries.  HPI   CHRONIC HTN / Chronic Diastolic CHF / Mitral Valve Regurgitation - Last visit with me discussed these concerns 06/04/17, for same problem and continues to follow-up with [facility] regularly at [facility] see prior notes for background information. - Interval update with mild acute flare of elevated LE edema, on 08/26/17 she was advised to double lasix from 20 BID to 40mg  BID and extra K tab daily x 3 days, and monitor weight, she improved and has resumed normal diuretic dose. - Today patient reports some concern again with mild increased swelling ankles / feet again for past 3-4 days, she cannot attribute it to anything, thinks maybe inc fluid intake, also ate hotdog x 2 yesterday and fries possibly with salt but she does not add salt to her foods. - She is taking Lasix 20mg  BID, and K 47mEq QID daily, but thinks lasix is not as effective as it used to be - Improved when she had recent episode of doubling lasix better urine output - Weight gain +4 lbs in 1 month, unsure weight over past few days, think it is due to fluid as she has not changed her diet or eating habits - She returns to Cardiology within 1 week - Also had mild low sodium, which had improved on re-check still mildly low as of 09/10/17 - Denies any dyspnea, chest pain or tightness, productive cough, difficulty laying flat orthopnea, near syncope, fall injury, redness of leg or asymmetry pain in leg  UTI - Resolved - Recent problem in 08/2017 with urinary frequency and UTI symptoms, presented to ED 09/05/17 and treated with Macrobid her symptoms have resolved  CHRONIC HTN No concerns with BP, monitoring at home. Current Meds - Metoprolol 12.5mg  (Half of 25mg  tab) BID, Lasix 20mg  BID - Taking  Potassium 73mEq 4 times daily Reports good compliance, took meds today. Tolerating well, w/o complaints. Lifestyle - Limited exercise - Taking Aspirin 325mg  daily, recommended by eye doctor at [place] ECHO 10/2015 with EF 60-65%, moderate mitral regurg without aortic stenosis Denies CP, dyspnea or exertional symptoms, HA, edema, dizziness / lightheadedness  Chronic Low Back Pain - Improved in interval from last visit.  Health Maintenance: - UTD Flu Vaccine 09/25/17 - UTD Pneumonia vaccine - UTD TDap, DEXA  Depression screen [facility] 2/9 10/08/2017 09/02/2017 08/26/2017  Decreased Interest 0 0 0  Down, Depressed, Hopeless 0 0 0  PHQ - 2 Score 0 0 0    Social History  Substance Use Topics  . Smoking status: Never Smoker  . Smokeless tobacco: Never Used  . Alcohol use No    Review of Systems Per HPI unless specifically indicated above     Objective:    BP (!) 112/58   Pulse 68   Temp 98 F (36.7 C) (Oral)   Resp 16   Ht 4\' 9"  (1.448 m)   Wt 97 lb (44 kg)   SpO2 94%   BMI 20.99 kg/m   Wt Readings from Last 3 Encounters:  10/08/17 97 lb (44 kg)  09/05/17 93 lb (42.2 kg)  09/02/17 93 lb 4 oz (42.3 kg)    Physical Exam  Constitutional: She is oriented to person, place, and time. She appears well-developed and  well-nourished. No distress.  Well-appearing elderly 83 year old female, comfortable, cooperative  HENT:  Head: Normocephalic and atraumatic.  Mouth/Throat: Oropharynx is clear and moist.  Oropharynx clear without erythema, exudates, edema or asymmetry.  Eyes: Conjunctivae are normal. Right eye exhibits no discharge. Left eye exhibits no discharge.  Neck: Normal range of motion. Neck supple. No thyromegaly present.  No JVD  Cardiovascular: Normal rate, regular rhythm, normal heart sounds and intact distal pulses.   No murmur heard. Pulmonary/Chest: Effort normal and breath sounds normal. No respiratory distress. She has no wheezes. She has  no rales.  Good air movement, speaks full sentences. No focal lung abnormality. No diminished breath sounds at basis. No fine basilar crackles.  Musculoskeletal: Normal range of motion. She exhibits edema (bilateral symmetrical pitting +1-2 edema lower legs and ankles, no erythema, non tender).  Lymphadenopathy:    She has no cervical adenopathy.  Neurological: She is alert and oriented to person, place, and time.  Distal sensation intact to light touch  Skin: Skin is warm and dry. No rash noted. She is not diaphoretic. No erythema.  Psychiatric: She has a normal mood and affect. Her behavior is normal.  Well groomed, good eye contact, normal speech and thoughts  Nursing note and vitals reviewed.  Results for orders placed or performed during the hospital encounter of 63/87/56  Basic metabolic panel  Result Value Ref Range   Sodium 128 (L) 135 - 145 mmol/L   Potassium 4.6 3.5 - 5.1 mmol/L   Chloride 93 (L) 101 - 111 mmol/L   CO2 27 22 - 32 mmol/L   Glucose, Bld 81 65 - 99 mg/dL   BUN 13 6 - 20 mg/dL   Creatinine, Ser 0.39 (L) 0.44 - 1.00 mg/dL   Calcium 8.7 (L) 8.9 - 10.3 mg/dL   GFR calc non Af Amer >60 >60 mL/min   GFR calc Af Amer >60 >60 mL/min   Anion gap 8 5 - 15      Assessment & Plan:   Problem List Items Addressed This Visit    Chronic diastolic CHF (congestive heart failure) (HCC) - Primary    Mild elevated wt gain +4 lbs in 1 month and LE pitting edema, concern volume up today Hemodynamically stable, asymptomatic from respiratory status, POx 94%, some difficulty initially getting sensor to read Followed by [facility] Dr Rockey Situ  Plan: 1. Similar to recent flare 08/2017, recommend repeat trial on higher dose Lasix 40mg  BID (double dose) x 3 days for edema, not concern with any pulmonary symptoms at this time, reassurance given. Deferred CXR or other labs. Take additional K dose 79mEq in addition to normal K 35mEq QID. Reassuring kidney function from prior chem  panel. - Discussed may need to adjust future lasix diuretic dose, recommend trial for double dose ONCE daily for 40mg  Lasix daily in future instead of split dosing 20 BID, see if this has better effect or may need 40 am and 20 pm - Will follow-up with Cards within 1 week to further adjust, she should notify them of this change and weight gain      Essential hypertension    Well-controlled HTN - now some inc fluid, increasing lasix - Home BP readings normal  No known complications   Plan:  1. Continue current BP regimen - Metoprolol 12.5mg  BID, Furosemide 20mg  BID - See A&P for CHF inc lasix for 3 days 2. Encourage improved lifestyle - low sodium diet, regular exercise 3. Continue monitor BP outside office,  bring readings to next visit, if persistently >140/90 or new symptoms notify office sooner. Caution hypotension as well. 4. Follow-up 5 months annual + labs         No orders of the defined types were placed in this encounter.  Follow up plan: Return in about 5 months (around 03/08/2018) for Annual Physical.   If not improved can return sooner or go to cardiology, may consider CXR if swelling worse, additional labs.  Nobie Putnam, DO [facility] 10/08/2017, 1:48 PM

## 2017-10-08 NOTE — Patient Instructions (Addendum)
Thank you for coming to the clinic today.  1. For next 3 days - take TWO Lasix 20mg  each TWICE daily for 3 days - also take ONE extra potassium pill for 3 days  AFTER 3 days - CHANGE Lasix regimen to now taking TWO Lasix 20mg  tablets at ONCE in morning, and SKIP afternoon dose, but you may take the extra 1 tablet 20mg  in afternoon, ONLY if significant swelling, difficulty breathing, increased fluid or weight gain - Resume normal potassium  2. No repeat blood work today, they will check it at Cardiology  DUE for Jennifer Skinner (no food or drink after midnight before the lab appointment, only water or coffee without cream/sugar on the morning of)  SCHEDULE "Lab Only" visit in the morning at the clinic for lab draw in 5 MONTHS   - Make sure Lab Only appointment is at about 1 week before your next appointment, so that results will be available  For Lab Results, once available within 2-3 days of blood draw, you can can log in to MyChart online to view your results and a brief explanation. Also, we can discuss results at next follow-up visit.  Please schedule a Follow-up Appointment to: Return in about 5 months (around 03/08/2018) for Annual Physical.  If you have any other questions or concerns, please feel free to call the clinic or send a message through [place]. You may also schedule an earlier appointment if necessary.  Additionally, you may be receiving a survey about your experience at our clinic within a few days to 1 week by e-mail or mail. We value your feedback.  Jennifer Putnam, DO [place]

## 2017-10-08 NOTE — Assessment & Plan Note (Signed)
Well-controlled HTN - now some inc fluid, increasing lasix - Home BP readings normal  No known complications   Plan:  1. Continue current BP regimen - Metoprolol 12.5mg  BID, Furosemide 20mg  BID - See A&P for CHF inc lasix for 3 days 2. Encourage improved lifestyle - low sodium diet, regular exercise 3. Continue monitor BP outside office, bring readings to next visit, if persistently >140/90 or new symptoms notify office sooner. Caution hypotension as well. 4. Follow-up 5 months annual + labs

## 2017-10-08 NOTE — Assessment & Plan Note (Signed)
Mild elevated wt gain +4 lbs in 1 month and LE pitting edema, concern volume up today Hemodynamically stable, asymptomatic from respiratory status, POx 94%, some difficulty initially getting sensor to read Followed by [facility] Dr Rockey Situ  Plan: 1. Similar to recent flare 08/2017, recommend repeat trial on higher dose Lasix 40mg  BID (double dose) x 3 days for edema, not concern with any pulmonary symptoms at this time, reassurance given. Deferred CXR or other labs. Take additional K dose 35mEq in addition to normal K 73mEq QID. Reassuring kidney function from prior chem panel. - Discussed may need to adjust future lasix diuretic dose, recommend trial for double dose ONCE daily for 40mg  Lasix daily in future instead of split dosing 20 BID, see if this has better effect or may need 40 am and 20 pm - Will follow-up with Cards within 1 week to further adjust, she should notify them of this change and weight gain

## 2017-10-12 NOTE — Progress Notes (Signed)
Cardiology Office Note  Date:  10/15/2017   ID:  Jennifer Skinner, DOB 03/31/1931, MRN [id number]  PCP:  Olin Hauser, DO   Chief Complaint  Patient presents with  . other    12 month follow up. Pt. c/o LE edema. Meds reviewed by the pt. verbally.     HPI:  83 year old female with history of  mitral valve prolapse and regurgitation,  diastolic dysfunction,  COPD, and GERD,  hospital admission 10/10/2015 for acute respiratory distress with hypoxia and COPD exacerbation in the setting of right lower lobe PNA and acute on chronic diastolic CHF,  who presents today f/u for acute on chronic diastolic CHF  Back pain, severe discomfort, reports having previous cortisone injections  High fluid intake, particularly water Takes Lasix 20 twice daily May have had some improvement on 40 twice daily for 3 days  Denies any chest pain,  occasional shortness of breath  EKG reviewed with her in detail showing normal sinus rhythm with diffuse T wave abnormality precordial leads, inferior leads, appears new concerning for ischemia  Other past medical history reviewed  10/07/2015, CXR showed RLL PNA superimposed on COPD/emphysema, small right parapneumonic effusion, stable cardiomegaly without evidence of pulmonary edema. CBC unremarkable. She was diagnosed with a right lower lobe pneumonia at that time and started on Levaquin with outpatient follow up. She returned to the ED on 10/31 with increased SOB and leg swelling x 3 days. She initially felt the LEE was 2/2 Levaquin and the urgent care changed her to doxycycline over the phone. She was found to have a BNP of 1528 upon her arrival to [facility].   Echo showed normal LV systolic funation at 43-32%, normal wall motion, GR1DD, mitral valve prolasp with moderate regurgitation, mild to moderate TR, PASP 56 mm Hg.   PMH:   has a past medical history of 1st degree AV block, Arthritis, Chronic diastolic CHF (congestive heart failure) (HCC), COPD  (chronic obstructive pulmonary disease) ([place]), Degenerative disc disease, lumbar, Gastro-esophageal reflux, Mitral prolapse, Mitral regurgitation, and Osteoporosis.  PSH:    Past Surgical History:  Procedure Laterality Date  . ABDOMINAL HYSTERECTOMY    . APPENDECTOMY    . DILATION AND CURETTAGE OF UTERUS    . HYSTEROTOMY    . right breast biopsy    . right eye      surgery  . TONSILLECTOMY      Current Outpatient Medications  Medication Sig Dispense Refill  . acetaminophen (TYLENOL) 500 MG tablet Take 500 mg by mouth every 6 (six) hours as needed.    Marland Kitchen albuterol (PROVENTIL) (2.5 MG/3ML) 0.083% nebulizer solution Take 3 mLs (2.5 mg total) by nebulization every 6 (six) hours as needed for wheezing or shortness of breath. 75 mL 12  . Apple Cider Vinegar (APPLE CIDER VINEGAR ULTRA) 600 MG CAPS 2 teaspoons once a day    . aspirin 325 MG tablet Take 325 mg by mouth daily.    . baclofen (LIORESAL) 10 MG tablet Take 0.5-1 tablets (5-10 mg total) by mouth 3 (three) times daily as needed for muscle spasms. 30 each 2  . Calcium Gluconate 500 MG CAPS Take 2 capsules by mouth daily.     . furosemide (LASIX) 20 MG tablet Take 1 tablet (20 mg total) by mouth 2 (two) times daily. 180 tablet 3  . gabapentin (NEURONTIN) 100 MG capsule Take 1 capsule (100 mg total) by mouth 3 (three) times daily. 270 capsule 3  . levothyroxine (SYNTHROID, LEVOTHROID) 25  MCG tablet Take 1 tablet (25 mcg total) by mouth daily before breakfast. Ran out of meds. 90 tablet 3  . metoprolol tartrate (LOPRESSOR) 25 MG tablet Take 0.5 tablets (12.5 mg total) by mouth 2 (two) times daily. 90 tablet 3  . mirabegron ER (MYRBETRIQ) 25 MG TB24 tablet Take 1 tablet (25 mg total) by mouth daily. 30 tablet 2  . Multiple Vitamin (MULTI-VITAMIN DAILY PO) Take by mouth daily.    [name] (OYSTER CALCIUM) 500 MG TABS Take 500 mg of elemental calcium by mouth 2 (two) times daily.    . polyethylene glycol powder (GLYCOLAX/MIRALAX) powder  Take 17-34 g by mouth daily as needed. 250 g 2  . potassium chloride (K-DUR) 10 MEQ tablet Take 1 tablet (10 mEq total) by mouth 4 (four) times daily. 360 tablet 3  . traMADol (ULTRAM) 50 MG tablet Take 1 tablet by mouth every 6 (six) hours as needed for moderate pain.      No current facility-administered medications for this visit.      Allergies:   Penicillins   Social History:  The patient  reports that  has never smoked. she has never used smokeless tobacco. She reports that she does not drink alcohol or use drugs.   Family History:   family history includes CAD in her brother; CVA (age of onset: 37) in her mother; Heart attack (age of onset: 72) in her father; Throat cancer in her sister.    Review of Systems: Review of Systems  Constitutional: Negative.   Respiratory: Positive for shortness of breath.   Cardiovascular: Positive for leg swelling.  Gastrointestinal: Negative.   Musculoskeletal: Positive for back pain.       Gait instability  Neurological: Negative.   Psychiatric/Behavioral: Negative.   All other systems reviewed and are negative.    PHYSICAL EXAM: VS:  BP 120/60 (BP Location: Left Arm, Patient Position: Sitting, Cuff Size: Normal)   Pulse 73   Ht 4\' 10"  (1.473 m)   Wt 97 lb 12 oz (44.3 kg)   BMI 20.43 kg/m  , BMI Body mass index is 20.43 kg/m. GEN:  in no acute distress, kyphosis, very thin  HEENT: normal  Neck: no JVD, carotid bruits, or masses Cardiac: RRR; 2+ SEM left sternal border radiating into the axilla, no rubs, or gallops, 1-2+ pitting edema bilateral lower extremities below the knees Respiratory:  clear to auscultation bilaterally, normal work of breathing GI: soft, nontender, nondistended, + BS MS: no deformity or atrophy  Skin: warm and dry, no rash Neuro:  Strength and sensation are intact Psych: euthymic mood, full affect    Recent Labs: 02/21/2017: ALT 21; Hemoglobin 13.5; Platelets 242; TSH 4.43 09/10/2017: BUN 13; Creatinine,  Ser 0.39; Potassium 4.6; Sodium 128    Lipid Panel Lab Results  Component Value Date   CHOL 192 02/21/2017   HDL 68 02/21/2017   LDLCALC 115 (H) 02/21/2017   TRIG 47 02/21/2017      Wt Readings from Last 3 Encounters:  10/15/17 97 lb 12 oz (44.3 kg)  10/08/17 97 lb (44 kg)  09/05/17 93 lb (42.2 kg)       ASSESSMENT AND PLAN:  Chronic diastolic CHF (congestive heart failure) ([place]) Long discussion with her, recommended lower free water intake.  Currently drinks 8 glasses of water a day.  She may need to liberalize her salt intake  Recommend Lasix 40 twice daily until edema dramatically improves then would alternate 40 twice daily with 20 twice daily  She reports having follow-up in CHF clinic in 1 month  Abnormal EKG Dramatic change on today's EKG, diffuse T wave abnormality Prior stress test available, we have ordered pharmacologic Myoview to rule out ischemia Hold off on repeat echocardiogram for now  Essential hypertension Blood pressure is well controlled on today's visit. No changes made to the medications.  MITRAL REGURGITATION Moderate mitral valve regurgitation, murmur appreciated on exam  Likely contributing to some of her edema  gait instability No regular exercise program, no recent falls Also reports having tremor    Total encounter time more than 45 minutes  Greater than 50% was spent in counseling and coordination of care with the patient   Disposition:   F/U  6 months    Signed, Esmond Plants, M.D., Ph.D. 10/15/2017  [place], [place]

## 2017-10-15 ENCOUNTER — Ambulatory Visit (INDEPENDENT_AMBULATORY_CARE_PROVIDER_SITE_OTHER): Payer: PPO | Admitting: Cardiovascular Disease

## 2017-10-15 ENCOUNTER — Encounter: Payer: Self-pay | Admitting: Cardiovascular Disease

## 2017-10-15 VITALS — BP 120/60 | HR 73 | Ht <= 58 in | Wt 97.8 lb

## 2017-10-15 DIAGNOSIS — R9431 Abnormal electrocardiogram [ECG] [EKG]: Secondary | ICD-10-CM

## 2017-10-15 DIAGNOSIS — J410 Simple chronic bronchitis: Secondary | ICD-10-CM

## 2017-10-15 DIAGNOSIS — E7800 Pure hypercholesterolemia, unspecified: Secondary | ICD-10-CM

## 2017-10-15 DIAGNOSIS — I10 Essential (primary) hypertension: Secondary | ICD-10-CM

## 2017-10-15 DIAGNOSIS — I080 Rheumatic disorders of both mitral and aortic valves: Secondary | ICD-10-CM | POA: Diagnosis not present

## 2017-10-15 DIAGNOSIS — J849 Interstitial pulmonary disease, unspecified: Secondary | ICD-10-CM | POA: Diagnosis not present

## 2017-10-15 DIAGNOSIS — I5032 Chronic diastolic (congestive) heart failure: Secondary | ICD-10-CM | POA: Diagnosis not present

## 2017-10-15 MED ORDER — FUROSEMIDE 40 MG PO TABS: 40.0000 mg | ORAL_TABLET | Freq: Two times a day (BID) | ORAL | 3 refills | Status: DC

## 2017-10-15 NOTE — Patient Instructions (Addendum)
Medication Instructions:   Please increase the lasix up to 2 in the Am and 2 in the PM Take with extra potassium  Once leg swelling  Improves, Alternate lasix 40 mg twice a day with 20 mg twice a day  Increase salt intake, cut back on water  Labwork:  No new labs needed  Testing/Procedures:  [place]  Your caregiver has ordered a Stress Test with nuclear imaging. The purpose of this test is to evaluate the blood supply to your heart muscle. This procedure is referred to as a "Non-Invasive Stress Test." This is because other than having an IV started in your vein, nothing is inserted or "invades" your body. Cardiac stress tests are done to find areas of poor blood flow to the heart by determining the extent of coronary artery disease (CAD). Some patients exercise on a treadmill, which naturally increases the blood flow to your heart, while others who are  unable to walk on a treadmill due to physical limitations have a pharmacologic/chemical stress agent called Lexiscan . This medicine will mimic walking on a treadmill by temporarily increasing your coronary blood flow.   Please note: these test may take anywhere between 2-4 hours to complete  PLEASE REPORT TO [place] AT THE FIRST DESK WILL DIRECT YOU WHERE TO GO  Date of Procedure:__Wednesday November 14th____  Arrival Time for Procedure:____07:45AM____  Instructions regarding medication:   __X__:  Hold Metoprolol the night before procedure and morning of procedure  __X__:  Hold furosemide that morning until after your procedure.  PLEASE NOTIFY THE OFFICE AT LEAST 42 HOURS IN ADVANCE IF YOU ARE UNABLE TO KEEP YOUR APPOINTMENT.  [phone] AND  PLEASE NOTIFY NUCLEAR MEDICINE AT [facility] AT LEAST 24 HOURS IN ADVANCE IF YOU ARE UNABLE TO KEEP YOUR APPOINTMENT. [phone]  How to prepare for your Myoview test:  1. Do not eat or drink after midnight 2. No caffeine for 24 hours prior to  test 3. No smoking 24 hours prior to test. 4. Your medication may be taken with water.  If your doctor stopped a medication because of this test, do not take that medication. 5. Ladies, please do not wear dresses.  Skirts or pants are appropriate. Please wear a short sleeve shirt. 6. No perfume, cologne or lotion. 7. Wear comfortable walking shoes. No heels!     Follow-Up: It was a pleasure seeing you in the office today. Please call us if you have new issues that need to be addressed before your next appt.  [phone]  Your physician wants you to follow-up in: 6 months.  You will receive a reminder letter in the mail two months in advance. If you don't receive a letter, please call our office to schedule the follow-up appointment.  If you need a refill on your cardiac medications before your next appointment, please call your pharmacy.

## 2017-10-22 ENCOUNTER — Telehealth: Payer: Self-pay | Admitting: Cardiovascular Disease

## 2017-10-22 NOTE — Telephone Encounter (Signed)
Spoke with patient and reviewed stress test instructions with her and confirmed arrival time. She is concerned about her back pain and dizziness when laying flat. She also reports anxiety as well. Reviewed medications to hold and she verbalized understanding with no further questions at this time.

## 2017-10-22 NOTE — Telephone Encounter (Signed)
Left voicemail message to call back  

## 2017-10-22 NOTE — Telephone Encounter (Signed)
Patient returning call.

## 2017-10-22 NOTE — Telephone Encounter (Signed)
Pt calling stating that she has a myoview tomorrow She is calling needing to know what can she take and can't take as far as her medications   Please call back

## 2017-10-23 ENCOUNTER — Ambulatory Visit
Admission: RE | Admit: 2017-10-23 | Discharge: 2017-10-23 | Disposition: A | Discharge status: Home / Self Care | Payer: PPO | Source: Ambulatory Visit | Attending: Cardiovascular Disease | Admitting: Cardiovascular Disease

## 2017-10-23 DIAGNOSIS — R9431 Abnormal electrocardiogram [ECG] [EKG]: Secondary | ICD-10-CM | POA: Diagnosis not present

## 2017-10-23 LAB — NM MYOCAR MULTI W/SPECT W/WALL MOTION / EF
CHL CUP NUCLEAR SDS: 0
CHL CUP NUCLEAR SRS: 11
CHL CUP RESTING HR STRESS: 90 {beats}/min
CHL CUP STRESS STAGE 1 HR: 90 {beats}/min
CHL CUP STRESS STAGE 2 GRADE: 0 %
CHL CUP STRESS STAGE 3 SPEED: 0 mph
CHL CUP STRESS STAGE 4 GRADE: 0 %
CHL CUP STRESS STAGE 4 HR: 96 {beats}/min
CHL CUP STRESS STAGE 5 DBP: 47 mmHg
CSEPPHR: 99 {beats}/min
CSEPPMHR: 72 %
Estimated workload: 1 METS
LVDIAVOL: 47 mL (ref 46–106)
LVSYSVOL: 13 mL
Percent HR: 73 %
SSS: 8
Stage 1 Grade: 0 %
Stage 1 Speed: 0 mph
Stage 2 HR: 90 {beats}/min
Stage 2 Speed: 0 mph
Stage 3 Grade: 0 %
Stage 3 HR: 99 {beats}/min
Stage 4 Speed: 0 mph
Stage 5 Grade: 0 %
Stage 5 HR: 85 {beats}/min
Stage 5 SBP: 137 mmHg
Stage 5 Speed: 0 mph
TID: 1.03

## 2017-10-23 MED ORDER — TECHNETIUM TC 99M TETROFOSMIN IV KIT
13.0000 | PACK | Freq: Once | INTRAVENOUS | Status: AC | PRN
  Administered 2017-10-23: 12.39 via INTRAVENOUS

## 2017-10-23 MED ORDER — TECHNETIUM TC 99M TETROFOSMIN IV KIT
32.5200 | PACK | Freq: Once | INTRAVENOUS | Status: AC | PRN
  Administered 2017-10-23: 32.52 via INTRAVENOUS

## 2017-10-23 MED ORDER — REGADENOSON 0.4 MG/5ML IV SOLN
0.4000 mg | Freq: Once | INTRAVENOUS | Status: AC
  Administered 2017-10-23: 0.4 mg via INTRAVENOUS

## 2017-10-28 ENCOUNTER — Other Ambulatory Visit: Payer: Self-pay

## 2017-10-28 ENCOUNTER — Encounter: Payer: Self-pay | Admitting: Family

## 2017-10-28 ENCOUNTER — Ambulatory Visit: Payer: PPO | Attending: Family | Admitting: Family

## 2017-10-28 VITALS — BP 116/63 | HR 85 | Resp 20 | Ht <= 58 in | Wt 97.2 lb

## 2017-10-28 DIAGNOSIS — Z79899 Other long term (current) drug therapy: Secondary | ICD-10-CM | POA: Insufficient documentation

## 2017-10-28 DIAGNOSIS — Z87440 Personal history of urinary (tract) infections: Secondary | ICD-10-CM | POA: Diagnosis not present

## 2017-10-28 DIAGNOSIS — Z7982 Long term (current) use of aspirin: Secondary | ICD-10-CM | POA: Diagnosis not present

## 2017-10-28 DIAGNOSIS — Z8249 Family history of ischemic heart disease and other diseases of the circulatory system: Secondary | ICD-10-CM | POA: Insufficient documentation

## 2017-10-28 DIAGNOSIS — Z823 Family history of stroke: Secondary | ICD-10-CM | POA: Diagnosis not present

## 2017-10-28 DIAGNOSIS — J449 Chronic obstructive pulmonary disease, unspecified: Secondary | ICD-10-CM | POA: Insufficient documentation

## 2017-10-28 DIAGNOSIS — R42 Dizziness and giddiness: Secondary | ICD-10-CM | POA: Insufficient documentation

## 2017-10-28 DIAGNOSIS — Z79891 Long term (current) use of opiate analgesic: Secondary | ICD-10-CM | POA: Diagnosis not present

## 2017-10-28 DIAGNOSIS — I890 Lymphedema, not elsewhere classified: Secondary | ICD-10-CM | POA: Diagnosis not present

## 2017-10-28 DIAGNOSIS — Z9889 Other specified postprocedural states: Secondary | ICD-10-CM | POA: Diagnosis not present

## 2017-10-28 DIAGNOSIS — M40209 Unspecified kyphosis, site unspecified: Secondary | ICD-10-CM | POA: Diagnosis not present

## 2017-10-28 DIAGNOSIS — I5032 Chronic diastolic (congestive) heart failure: Secondary | ICD-10-CM | POA: Insufficient documentation

## 2017-10-28 DIAGNOSIS — I110 Hypertensive heart disease with heart failure: Secondary | ICD-10-CM | POA: Diagnosis not present

## 2017-10-28 DIAGNOSIS — M545 Low back pain, unspecified: Secondary | ICD-10-CM

## 2017-10-28 DIAGNOSIS — I440 Atrioventricular block, first degree: Secondary | ICD-10-CM | POA: Insufficient documentation

## 2017-10-28 DIAGNOSIS — K219 Gastro-esophageal reflux disease without esophagitis: Secondary | ICD-10-CM | POA: Diagnosis not present

## 2017-10-28 DIAGNOSIS — I5031 Acute diastolic (congestive) heart failure: Secondary | ICD-10-CM | POA: Insufficient documentation

## 2017-10-28 DIAGNOSIS — Z90710 Acquired absence of both cervix and uterus: Secondary | ICD-10-CM | POA: Diagnosis not present

## 2017-10-28 DIAGNOSIS — Z880 Allergy status to penicillin: Secondary | ICD-10-CM | POA: Diagnosis not present

## 2017-10-28 DIAGNOSIS — I340 Nonrheumatic mitral (valve) insufficiency: Secondary | ICD-10-CM | POA: Diagnosis not present

## 2017-10-28 DIAGNOSIS — I10 Essential (primary) hypertension: Secondary | ICD-10-CM

## 2017-10-28 DIAGNOSIS — I341 Nonrheumatic mitral (valve) prolapse: Secondary | ICD-10-CM | POA: Insufficient documentation

## 2017-10-28 DIAGNOSIS — G8929 Other chronic pain: Secondary | ICD-10-CM | POA: Diagnosis not present

## 2017-10-28 MED ORDER — TORSEMIDE 20 MG PO TABS: 40.0000 mg | ORAL_TABLET | Freq: Two times a day (BID) | ORAL | 3 refills | Status: DC

## 2017-10-28 NOTE — Progress Notes (Signed)
Patient ID: Jennifer Skinner, female    DOB: Dec 09, 1931, 83 y.o.   MRN: [id number]  HPI  Jennifer Skinner is a 83 y/o female who has a history of MV prolapse, COPD, HTN, GERD and HF with an EF of 60-65% who returns for follow-up today.    Last echo done 10/11/15 with an EF of 60-65% with moderate mitral regurgitation but without aortic stenosis. Mild to moderate TR,  PASP 56mg  Hg.  Was in the ED 09/05/17 for UTI where she was treated and released.   Returns today for a follow-up visit with a chief complaint of lower leg edema. She describes this as chronic in nature having been present for several years with varying levels of severity. She does feel like it has been worsening over the last few weeks. Has recently had her diuretic increased but without much relief. She has associated fatigue, shortness of breath, dizziness and weight gain along with this. She denies any chest pain, palpitations or difficulty sleeping.   Past Medical History:  Diagnosis Date  . 1st degree AV block   . Arthritis   . Chronic diastolic CHF (congestive heart failure) ([place])    a. echo 2014: EF 55-60%, nl wall motion, GR1DD, mild MR, moder mitral prolapse, PASP 40 mm Hg; b. echo 09/2015: EF 60-65%, nl wall motion, GR1DD, mod MR, mild to mod TR, PASP 56 mm Hg  . COPD (chronic obstructive pulmonary disease) ([place])   . Degenerative disc disease, lumbar   . Gastro-esophageal reflux   . Mitral prolapse   . Mitral regurgitation   . Osteoporosis    Past Surgical History:  Procedure Laterality Date  . ABDOMINAL HYSTERECTOMY    . APPENDECTOMY    . DILATION AND CURETTAGE OF UTERUS    . HYSTEROTOMY    . right breast biopsy    . right eye      surgery  . TONSILLECTOMY     Family History  Problem Relation Age of Onset  . CVA Mother 55  . Heart attack Father 49  . Throat cancer Sister   . CAD Brother    Social History   Tobacco Use  . Smoking status: Never Smoker  . Smokeless tobacco: Never Used  Substance Use Topics   . Alcohol use: No    Alcohol/week: 0.0 oz   Allergies  Allergen Reactions  . Penicillins     Rash    Prior to Admission medications   Medication Sig Start Date End Date Taking? Authorizing Provider  acetaminophen (TYLENOL) 500 MG tablet Take 500 mg by mouth every 6 (six) hours as needed.   Yes [provider]  albuterol (PROVENTIL) (2.5 MG/3ML) 0.083% nebulizer solution Take 3 mLs (2.5 mg total) by nebulization every 6 (six) hours as needed for wheezing or shortness of breath. 10/12/15  Yes Epifanio Lesches, MD  Apple Cider Vinegar (APPLE CIDER VINEGAR ULTRA) 600 MG CAPS 2 teaspoons once a day   Yes [provider]  aspirin 325 MG tablet Take 325 mg by mouth daily.   Yes [provider]  baclofen (LIORESAL) 10 MG tablet Take 0.5-1 tablets (5-10 mg total) by mouth 3 (three) times daily as needed for muscle spasms. 08/19/17  Yes Karamalegos, Devonne Doughty, DO  Calcium Gluconate 500 MG CAPS Take 2 capsules by mouth daily.    Yes [provider]  furosemide (LASIX) 40 MG tablet Take 1 tablet (40 mg total) 2 (two) times daily by mouth. 10/15/17  Yes Minna Merritts,  MD  gabapentin (NEURONTIN) 100 MG capsule Take 1 capsule (100 mg total) by mouth 3 (three) times daily. 06/04/17  Yes Karamalegos, Devonne Doughty, DO  levothyroxine (SYNTHROID, LEVOTHROID) 25 MCG tablet Take 1 tablet (25 mcg total) by mouth daily before breakfast. Ran out of meds. 02/20/17  Yes Arlis Porta., MD  metoprolol tartrate (LOPRESSOR) 25 MG tablet Take 0.5 tablets (12.5 mg total) by mouth 2 (two) times daily. 03/12/17  Yes Karamalegos, Devonne Doughty, DO  Multiple Vitamin (MULTI-VITAMIN DAILY PO) Take by mouth daily.   Yes [provider]  [name] (OYSTER CALCIUM) 500 MG TABS Take 500 mg of elemental calcium by mouth 2 (two) times daily.   Yes [provider]  polyethylene glycol powder (GLYCOLAX/MIRALAX) powder Take 17-34 g by mouth daily as needed. 08/19/17  Yes  Karamalegos, Devonne Doughty, DO  potassium chloride (K-DUR) 10 MEQ tablet Take 1 tablet (10 mEq total) by mouth 4 (four) times daily. 12/31/16  Yes Gollan, Kathlene November, MD  traMADol (ULTRAM) 50 MG tablet Take 1 tablet by mouth every 6 (six) hours as needed for moderate pain.  08/23/14  Yes [provider]  mirabegron ER (MYRBETRIQ) 25 MG TB24 tablet Take 1 tablet (25 mg total) by mouth daily. 09/05/17   Olin Hauser, DO   Review of Systems  Constitutional: Positive for fatigue. Negative for appetite change.  HENT: Positive for sore throat. Negative for congestion and postnasal drip.   Eyes: Negative.   Respiratory: Positive for shortness of breath. Negative for cough and chest tightness.   Cardiovascular: Positive for leg swelling. Negative for chest pain and palpitations.  Gastrointestinal: Negative for abdominal distention and abdominal pain.  Endocrine: Negative.   Genitourinary: Negative.   Musculoskeletal: Positive for back pain. Negative for neck pain.  Skin: Negative.   Allergic/Immunologic: Negative.   Neurological: Positive for dizziness (at times). Negative for light-headedness.  Hematological: Negative for adenopathy. Does not bruise/bleed easily.  Psychiatric/Behavioral: Negative for dysphoric mood and sleep disturbance (sleeping on 1 pillow). The patient is not nervous/anxious.    Vitals:   10/28/17 1026  BP: 116/63  Pulse: 85  Resp: 20  SpO2: (!) 87%  Weight: 97 lb 4 oz (44.1 kg)  Height: 4\' 9"  (1.448 m)   Wt Readings from Last 3 Encounters:  10/28/17 97 lb 4 oz (44.1 kg)  10/15/17 97 lb 12 oz (44.3 kg)  10/08/17 97 lb (44 kg)    Lab Results  Component Value Date   CREATININE 0.39 (L) 09/10/2017   CREATININE <0.30 (L) 09/02/2017   CREATININE 0.53 (L) 02/21/2017    Physical Exam  Constitutional: She is oriented to person, place, and time. She appears well-developed and well-nourished.  HENT:  Head: Normocephalic and atraumatic.  Neck: Normal  range of motion. Neck supple. No JVD present.  Cardiovascular: Normal rate and regular rhythm.  Pulmonary/Chest: Effort normal. She has no wheezes. She has no rales.  Abdominal: Soft. She exhibits no distension. There is no tenderness.  Musculoskeletal: She exhibits edema (2+ pitting edema in bilateral lower legs). She exhibits no tenderness.  Neurological: She is alert and oriented to person, place, and time.  Skin: Skin is warm and dry.  Psychiatric: She has a normal mood and affect. Her behavior is normal. Thought content normal.  Nursing note and vitals reviewed.   Assessment & Plan:  1: Chronic heart failure with preserved ejection fraction- - NYHA Class II - moderately fluid overloaded today - Not adding salt to her food/reading  food labels some - Continues to weigh daily. Reminded to call for an overnight weight gain of >2 pounds or weekly weight gain of >5 pounds - weight up 4 pounds from last visit - has been on furosemide for a few years - will change her diuretic to torsemide 40mg  BID - Saw cardiologist Rockey Situ) on 10/15/17  2: HTN- - BP looks good today - Continue lopressor 12.5mg  twice daily - Saw PCP [place] Ranger) on 10/08/2017  3: Chronic back pain-  - Continue on baclofen, gabapentin and tramadol  - O2 level 87% initially and again, on repeat. Patient is becoming more hunched over due to kyphosis which could be affecting her oxygen level  4: Lymphedema- - stage 2 - doesn't elevate her legs much and she was encouraged to elevate her legs when she's sitting for long periods - RX written for her to get TED hose and she was instructed to apply them every morning and remove them at bedtime - unable to exercise due to severe kyphosis and chronic back pain - discussed compression boots but will try above therapy first  Patient did not bring her medications nor a list. Each medication was verbally reviewed with the patient and she was encouraged to bring the bottles to  every visit to confirm accuracy of list.  Return in 1 month or sooner for any questions/problems before then.

## 2017-10-28 NOTE — Patient Instructions (Addendum)
Continue weighing daily and call for an overnight weight gain of > 2 pounds or a weekly weight gain of >5 pounds.  When you pick up fluid pill, make sure you get the torsemide and not the furosemide.

## 2017-11-18 ENCOUNTER — Ambulatory Visit: Payer: PPO | Admitting: Family

## 2017-11-27 ENCOUNTER — Ambulatory Visit: Payer: PPO | Attending: Family | Admitting: Family

## 2017-11-27 ENCOUNTER — Encounter: Payer: Self-pay | Admitting: Family

## 2017-11-27 ENCOUNTER — Other Ambulatory Visit: Payer: Self-pay

## 2017-11-27 VITALS — BP 98/61 | HR 73 | Resp 20 | Ht <= 58 in | Wt 91.2 lb

## 2017-11-27 DIAGNOSIS — Z7982 Long term (current) use of aspirin: Secondary | ICD-10-CM | POA: Diagnosis not present

## 2017-11-27 DIAGNOSIS — K219 Gastro-esophageal reflux disease without esophagitis: Secondary | ICD-10-CM | POA: Diagnosis not present

## 2017-11-27 DIAGNOSIS — Z87440 Personal history of urinary (tract) infections: Secondary | ICD-10-CM | POA: Diagnosis not present

## 2017-11-27 DIAGNOSIS — G8929 Other chronic pain: Secondary | ICD-10-CM | POA: Insufficient documentation

## 2017-11-27 DIAGNOSIS — Z823 Family history of stroke: Secondary | ICD-10-CM | POA: Diagnosis not present

## 2017-11-27 DIAGNOSIS — Z79899 Other long term (current) drug therapy: Secondary | ICD-10-CM | POA: Insufficient documentation

## 2017-11-27 DIAGNOSIS — I341 Nonrheumatic mitral (valve) prolapse: Secondary | ICD-10-CM | POA: Insufficient documentation

## 2017-11-27 DIAGNOSIS — Z8249 Family history of ischemic heart disease and other diseases of the circulatory system: Secondary | ICD-10-CM | POA: Insufficient documentation

## 2017-11-27 DIAGNOSIS — M40209 Unspecified kyphosis, site unspecified: Secondary | ICD-10-CM | POA: Diagnosis not present

## 2017-11-27 DIAGNOSIS — I340 Nonrheumatic mitral (valve) insufficiency: Secondary | ICD-10-CM | POA: Insufficient documentation

## 2017-11-27 DIAGNOSIS — Z880 Allergy status to penicillin: Secondary | ICD-10-CM | POA: Diagnosis not present

## 2017-11-27 DIAGNOSIS — M545 Low back pain, unspecified: Secondary | ICD-10-CM

## 2017-11-27 DIAGNOSIS — I110 Hypertensive heart disease with heart failure: Secondary | ICD-10-CM | POA: Diagnosis not present

## 2017-11-27 DIAGNOSIS — I890 Lymphedema, not elsewhere classified: Secondary | ICD-10-CM | POA: Insufficient documentation

## 2017-11-27 DIAGNOSIS — Z808 Family history of malignant neoplasm of other organs or systems: Secondary | ICD-10-CM | POA: Diagnosis not present

## 2017-11-27 DIAGNOSIS — M5136 Other intervertebral disc degeneration, lumbar region: Secondary | ICD-10-CM | POA: Insufficient documentation

## 2017-11-27 DIAGNOSIS — I440 Atrioventricular block, first degree: Secondary | ICD-10-CM | POA: Insufficient documentation

## 2017-11-27 DIAGNOSIS — M549 Dorsalgia, unspecified: Secondary | ICD-10-CM | POA: Insufficient documentation

## 2017-11-27 DIAGNOSIS — M810 Age-related osteoporosis without current pathological fracture: Secondary | ICD-10-CM | POA: Insufficient documentation

## 2017-11-27 DIAGNOSIS — I10 Essential (primary) hypertension: Secondary | ICD-10-CM

## 2017-11-27 DIAGNOSIS — Z79890 Hormone replacement therapy (postmenopausal): Secondary | ICD-10-CM | POA: Insufficient documentation

## 2017-11-27 DIAGNOSIS — I5032 Chronic diastolic (congestive) heart failure: Secondary | ICD-10-CM | POA: Diagnosis not present

## 2017-11-27 DIAGNOSIS — J449 Chronic obstructive pulmonary disease, unspecified: Secondary | ICD-10-CM | POA: Insufficient documentation

## 2017-11-27 DIAGNOSIS — Z90710 Acquired absence of both cervix and uterus: Secondary | ICD-10-CM | POA: Insufficient documentation

## 2017-11-27 NOTE — Progress Notes (Signed)
Patient ID: Jennifer Skinner, female    DOB: Nov 27, 1931, 83 y.o.   MRN: [id number]  HPI  Jennifer Skinner is a 83 y/o female who has a history of MV prolapse, COPD, HTN, GERD and HF with an EF of 60-65% who returns for follow-up today.    Last echo done 10/11/15 with an EF of 60-65% with moderate mitral regurgitation but without aortic stenosis. Mild to moderate TR,  PASP 56mg  Hg.  Was in the ED 09/05/17 for UTI where she was treated and released.   Returns today for a follow-up visit with a chief complaint of minimal shortness of breath upon moderate exertion. She describes this as chronic in nature having been present for several years with varying levels of severity. She has associated fatigue, chronic edema, dizziness and chronic back pain. She denies any chest pain, palpitations, difficulty sleeping or weight gain. In fact, she's lost weight since she was last here.   Past Medical History:  Diagnosis Date  . 1st degree AV block   . Arthritis   . Chronic diastolic CHF (congestive heart failure) ([place])    a. echo 2014: EF 55-60%, nl wall motion, GR1DD, mild MR, moder mitral prolapse, PASP 40 mm Hg; b. echo 09/2015: EF 60-65%, nl wall motion, GR1DD, mod MR, mild to mod TR, PASP 56 mm Hg  . COPD (chronic obstructive pulmonary disease) ([place])   . Degenerative disc disease, lumbar   . Gastro-esophageal reflux   . Mitral prolapse   . Mitral regurgitation   . Osteoporosis    Past Surgical History:  Procedure Laterality Date  . ABDOMINAL HYSTERECTOMY    . APPENDECTOMY    . DILATION AND CURETTAGE OF UTERUS    . HYSTEROTOMY    . right breast biopsy    . right eye      surgery  . TONSILLECTOMY     Family History  Problem Relation Age of Onset  . CVA Mother 64  . Heart attack Father 37  . Throat cancer Sister   . CAD Brother    Social History   Tobacco Use  . Smoking status: Never Smoker  . Smokeless tobacco: Never Used  Substance Use Topics  . Alcohol use: No    Alcohol/week: 0.0 oz    Allergies  Allergen Reactions  . Penicillins     Rash    Prior to Admission medications   Medication Sig Start Date End Date Taking? Authorizing Provider  acetaminophen (TYLENOL) 500 MG tablet Take 500 mg by mouth every 6 (six) hours as needed.   Yes [provider]  albuterol (PROVENTIL) (2.5 MG/3ML) 0.083% nebulizer solution Take 3 mLs (2.5 mg total) by nebulization every 6 (six) hours as needed for wheezing or shortness of breath. 10/12/15  Yes Jennifer Lesches, MD  Apple Cider Vinegar (APPLE CIDER VINEGAR ULTRA) 600 MG CAPS 2 teaspoons once a day   Yes [provider]  aspirin 325 MG tablet Take 325 mg by mouth daily.   Yes [provider]  baclofen (LIORESAL) 10 MG tablet Take 0.5-1 tablets (5-10 mg total) by mouth 3 (three) times daily as needed for muscle spasms. 08/19/17  Yes Skinner, Jennifer Doughty, DO  Calcium Gluconate 500 MG CAPS Take 2 capsules by mouth daily.    Yes [provider]  gabapentin (NEURONTIN) 100 MG capsule Take 1 capsule (100 mg total) by mouth 3 (three) times daily. 06/04/17  Yes Skinner, Jennifer Doughty, DO  levothyroxine (SYNTHROID, LEVOTHROID) 25 MCG tablet Take 1  tablet (25 mcg total) by mouth daily before breakfast. Ran out of meds. 02/20/17  Yes Jennifer Porta., MD  metoprolol tartrate (LOPRESSOR) 25 MG tablet Take 0.5 tablets (12.5 mg total) by mouth 2 (two) times daily. 03/12/17  Yes Skinner, Jennifer Doughty, DO  mirabegron ER (MYRBETRIQ) 25 MG TB24 tablet Take 1 tablet (25 mg total) by mouth daily. 09/05/17  Yes Skinner, Jennifer Doughty, DO  Multiple Vitamin (MULTI-VITAMIN DAILY PO) Take by mouth daily.   Yes [provider]  [name] (OYSTER CALCIUM) 500 MG TABS Take 500 mg of elemental calcium by mouth 2 (two) times daily.   Yes [provider]  polyethylene glycol powder (GLYCOLAX/MIRALAX) powder Take 17-34 g by mouth daily as needed. 08/19/17  Yes Skinner, Jennifer Doughty, DO  potassium  chloride (K-DUR) 10 MEQ tablet Take 1 tablet (10 mEq total) by mouth 4 (four) times daily. 12/31/16  Yes Jennifer Merritts, MD  torsemide (DEMADEX) 20 MG tablet Take 2 tablets (40 mg total) 2 (two) times daily by mouth. 10/28/17 11/27/17 Yes Jennifer Skinner, Jennifer Fey, FNP  traMADol (ULTRAM) 50 MG tablet Take 1 tablet by mouth every 6 (six) hours as needed for moderate pain.  08/23/14  Yes [provider]    Review of Systems  Constitutional: Positive for fatigue. Negative for appetite change.  HENT: Negative for congestion, postnasal drip and sore throat.   Eyes: Negative.   Respiratory: Positive for shortness of breath. Negative for cough and chest tightness.   Cardiovascular: Positive for leg swelling (better first thing in the mornings). Negative for chest pain and palpitations.  Gastrointestinal: Negative for abdominal distention and abdominal pain.  Endocrine: Negative.   Genitourinary: Negative.   Musculoskeletal: Positive for back pain. Negative for neck pain.  Skin: Negative.   Allergic/Immunologic: Negative.   Neurological: Positive for dizziness (at times). Negative for light-headedness.  Hematological: Negative for adenopathy. Does not bruise/bleed easily.  Psychiatric/Behavioral: Negative for dysphoric mood and sleep disturbance (sleeping on 1 pillow). The patient is not nervous/anxious.    Vitals:   11/27/17 1035  BP: 98/61  Pulse: 73  Resp: 20  SpO2: 92%  Weight: 91 lb 4 oz (41.4 kg)  Height: 4\' 9"  (1.448 m)   Wt Readings from Last 3 Encounters:  11/27/17 91 lb 4 oz (41.4 kg)  10/28/17 97 lb 4 oz (44.1 kg)  10/15/17 97 lb 12 oz (44.3 kg)    Lab Results  Component Value Date   CREATININE 0.39 (L) 09/10/2017   CREATININE <0.30 (L) 09/02/2017   CREATININE 0.53 (L) 02/21/2017    Physical Exam  Constitutional: She is oriented to person, place, and time. She appears well-developed and well-nourished.  HENT:  Head: Normocephalic and atraumatic.  Neck: Normal range  of motion. Neck supple. No JVD present.  Cardiovascular: Normal rate and regular rhythm.  Pulmonary/Chest: Effort normal. She has no wheezes. She has no rales.  Abdominal: Soft. She exhibits no distension. There is no tenderness.  Musculoskeletal: She exhibits edema (1+ pitting edema in bilateral lower legs). She exhibits no tenderness.  Neurological: She is alert and oriented to person, place, and time.  Skin: Skin is warm and dry.  Psychiatric: She has a normal mood and affect. Her behavior is normal. Thought content normal.  Nursing note and vitals reviewed.   Assessment & Plan:  1: Chronic heart failure with preserved ejection fraction- - NYHA Class II - mildly fluid overloaded today but improved from last visit - Not adding salt to her  food/reading food labels some - Continues to weigh daily. Reminded to call for an overnight weight gain of >2 pounds or weekly weight gain of >5 pounds - weight down 6 pounds from last visit - torsemide seems to be working better for her with decreased edema in lower legs - Saw cardiologist Rockey Situ) on 10/15/17  2: HTN- - BP on the low side today - Continue lopressor 12.5mg  twice daily - Saw PCP Parks Ranger) on 10/08/2017 & returns April 2019  3: Chronic back pain-  - Continue on baclofen, gabapentin and tramadol  - Patient is becoming more hunched over due to kyphosis   4: Lymphedema- - stage 2 - doesn't elevate her legs much and she was encouraged to elevate her legs when she's sitting for long periods - has not gotten TED hose yet; discussed the importance of either getting those or some sort of support sock - unable to exercise due to severe kyphosis and chronic back pain - discussed compression boots again but patient isn't very interested right now and wants to try the socks first  Patient did not bring her medications nor a list. Each medication was verbally reviewed with the patient and she was encouraged to bring the bottles to every  visit to confirm accuracy of list.  Return in 6 months or sooner for any questions/problems before then.

## 2017-11-27 NOTE — Patient Instructions (Signed)
Continue weighing daily and call for an overnight weight gain of > 2 pounds or a weekly weight gain of >5 pounds. 

## 2017-12-17 ENCOUNTER — Other Ambulatory Visit: Payer: Self-pay | Admitting: Cardiovascular Disease

## 2017-12-24 ENCOUNTER — Telehealth: Payer: Self-pay | Admitting: Family Medicine

## 2017-12-24 NOTE — Telephone Encounter (Signed)
Called pt to sched for AWV with Nurse Health Advisor on 03/04/18 C/b #  [phone] on Skype [email or web address] if you have questions

## 2018-01-06 ENCOUNTER — Telehealth: Payer: Self-pay

## 2018-01-06 NOTE — Telephone Encounter (Signed)
Jennifer Skinner contacted the office this morning stating that she thinks she may be swollen. Would like to know if she can be seen today.   Please advise patient.

## 2018-01-07 ENCOUNTER — Inpatient Hospital Stay
Admission: EM | Admit: 2018-01-07 | Discharge: 2018-01-11 | DRG: 291 | Disposition: A | Discharge status: Home Health | Payer: Medicare Other | Attending: Internal Medicine | Admitting: Internal Medicine

## 2018-01-07 ENCOUNTER — Encounter: Payer: Self-pay | Admitting: Family

## 2018-01-07 ENCOUNTER — Encounter: Payer: Self-pay | Admitting: Emergency Medicine

## 2018-01-07 ENCOUNTER — Ambulatory Visit: Payer: Medicare Other | Attending: Family | Admitting: Family

## 2018-01-07 ENCOUNTER — Emergency Department: Payer: Medicare Other

## 2018-01-07 ENCOUNTER — Other Ambulatory Visit: Payer: Self-pay

## 2018-01-07 ENCOUNTER — Inpatient Hospital Stay (HOSPITAL_COMMUNITY)
Admit: 2018-01-07 | Discharge: 2018-01-07 | Disposition: A | Discharge status: Home / Self Care | Payer: Medicare Other | Attending: Internal Medicine | Admitting: Internal Medicine

## 2018-01-07 VITALS — BP 108/57 | HR 79 | Resp 18 | Ht <= 58 in | Wt 96.5 lb

## 2018-01-07 DIAGNOSIS — I509 Heart failure, unspecified: Secondary | ICD-10-CM | POA: Insufficient documentation

## 2018-01-07 DIAGNOSIS — I440 Atrioventricular block, first degree: Secondary | ICD-10-CM | POA: Diagnosis present

## 2018-01-07 DIAGNOSIS — Z7982 Long term (current) use of aspirin: Secondary | ICD-10-CM | POA: Insufficient documentation

## 2018-01-07 DIAGNOSIS — I5033 Acute on chronic diastolic (congestive) heart failure: Secondary | ICD-10-CM | POA: Diagnosis not present

## 2018-01-07 DIAGNOSIS — Z79890 Hormone replacement therapy (postmenopausal): Secondary | ICD-10-CM

## 2018-01-07 DIAGNOSIS — E43 Unspecified severe protein-calorie malnutrition: Secondary | ICD-10-CM | POA: Diagnosis not present

## 2018-01-07 DIAGNOSIS — J449 Chronic obstructive pulmonary disease, unspecified: Secondary | ICD-10-CM | POA: Diagnosis present

## 2018-01-07 DIAGNOSIS — Z7189 Other specified counseling: Secondary | ICD-10-CM | POA: Diagnosis not present

## 2018-01-07 DIAGNOSIS — I081 Rheumatic disorders of both mitral and tricuspid valves: Secondary | ICD-10-CM | POA: Diagnosis present

## 2018-01-07 DIAGNOSIS — R740 Nonspecific elevation of levels of transaminase and lactic acid dehydrogenase [LDH]: Secondary | ICD-10-CM | POA: Diagnosis not present

## 2018-01-07 DIAGNOSIS — Z880 Allergy status to penicillin: Secondary | ICD-10-CM

## 2018-01-07 DIAGNOSIS — I10 Essential (primary) hypertension: Secondary | ICD-10-CM

## 2018-01-07 DIAGNOSIS — I110 Hypertensive heart disease with heart failure: Principal | ICD-10-CM | POA: Diagnosis present

## 2018-01-07 DIAGNOSIS — M5136 Other intervertebral disc degeneration, lumbar region: Secondary | ICD-10-CM | POA: Insufficient documentation

## 2018-01-07 DIAGNOSIS — I361 Nonrheumatic tricuspid (valve) insufficiency: Secondary | ICD-10-CM

## 2018-01-07 DIAGNOSIS — R627 Adult failure to thrive: Secondary | ICD-10-CM | POA: Diagnosis present

## 2018-01-07 DIAGNOSIS — Z515 Encounter for palliative care: Secondary | ICD-10-CM | POA: Diagnosis not present

## 2018-01-07 DIAGNOSIS — Z79899 Other long term (current) drug therapy: Secondary | ICD-10-CM | POA: Insufficient documentation

## 2018-01-07 DIAGNOSIS — I5082 Biventricular heart failure: Secondary | ICD-10-CM | POA: Diagnosis present

## 2018-01-07 DIAGNOSIS — R748 Abnormal levels of other serum enzymes: Secondary | ICD-10-CM | POA: Diagnosis not present

## 2018-01-07 DIAGNOSIS — Z808 Family history of malignant neoplasm of other organs or systems: Secondary | ICD-10-CM | POA: Insufficient documentation

## 2018-01-07 DIAGNOSIS — I2781 Cor pulmonale (chronic): Secondary | ICD-10-CM | POA: Diagnosis present

## 2018-01-07 DIAGNOSIS — J410 Simple chronic bronchitis: Secondary | ICD-10-CM | POA: Diagnosis not present

## 2018-01-07 DIAGNOSIS — I890 Lymphedema, not elsewhere classified: Secondary | ICD-10-CM | POA: Insufficient documentation

## 2018-01-07 DIAGNOSIS — N179 Acute kidney failure, unspecified: Secondary | ICD-10-CM

## 2018-01-07 DIAGNOSIS — E039 Hypothyroidism, unspecified: Secondary | ICD-10-CM | POA: Diagnosis present

## 2018-01-07 DIAGNOSIS — G8929 Other chronic pain: Secondary | ICD-10-CM | POA: Diagnosis present

## 2018-01-07 DIAGNOSIS — I272 Pulmonary hypertension, unspecified: Secondary | ICD-10-CM

## 2018-01-07 DIAGNOSIS — R0902 Hypoxemia: Secondary | ICD-10-CM | POA: Diagnosis not present

## 2018-01-07 DIAGNOSIS — R531 Weakness: Secondary | ICD-10-CM | POA: Diagnosis present

## 2018-01-07 DIAGNOSIS — E785 Hyperlipidemia, unspecified: Secondary | ICD-10-CM | POA: Diagnosis present

## 2018-01-07 DIAGNOSIS — I420 Dilated cardiomyopathy: Secondary | ICD-10-CM | POA: Diagnosis present

## 2018-01-07 DIAGNOSIS — I080 Rheumatic disorders of both mitral and aortic valves: Secondary | ICD-10-CM | POA: Diagnosis present

## 2018-01-07 DIAGNOSIS — Z681 Body mass index (BMI) 19 or less, adult: Secondary | ICD-10-CM

## 2018-01-07 DIAGNOSIS — R64 Cachexia: Secondary | ICD-10-CM | POA: Diagnosis present

## 2018-01-07 DIAGNOSIS — M810 Age-related osteoporosis without current pathological fracture: Secondary | ICD-10-CM | POA: Diagnosis present

## 2018-01-07 DIAGNOSIS — Z90710 Acquired absence of both cervix and uterus: Secondary | ICD-10-CM | POA: Insufficient documentation

## 2018-01-07 DIAGNOSIS — Z8249 Family history of ischemic heart disease and other diseases of the circulatory system: Secondary | ICD-10-CM | POA: Insufficient documentation

## 2018-01-07 DIAGNOSIS — I2721 Secondary pulmonary arterial hypertension: Secondary | ICD-10-CM | POA: Diagnosis present

## 2018-01-07 DIAGNOSIS — M40209 Unspecified kyphosis, site unspecified: Secondary | ICD-10-CM | POA: Insufficient documentation

## 2018-01-07 DIAGNOSIS — I5032 Chronic diastolic (congestive) heart failure: Secondary | ICD-10-CM

## 2018-01-07 DIAGNOSIS — I5031 Acute diastolic (congestive) heart failure: Secondary | ICD-10-CM | POA: Diagnosis not present

## 2018-01-07 DIAGNOSIS — Z9889 Other specified postprocedural states: Secondary | ICD-10-CM | POA: Insufficient documentation

## 2018-01-07 DIAGNOSIS — J9601 Acute respiratory failure with hypoxia: Secondary | ICD-10-CM

## 2018-01-07 DIAGNOSIS — K761 Chronic passive congestion of liver: Secondary | ICD-10-CM | POA: Diagnosis present

## 2018-01-07 DIAGNOSIS — Z823 Family history of stroke: Secondary | ICD-10-CM | POA: Insufficient documentation

## 2018-01-07 DIAGNOSIS — J849 Interstitial pulmonary disease, unspecified: Secondary | ICD-10-CM | POA: Diagnosis present

## 2018-01-07 DIAGNOSIS — Z79891 Long term (current) use of opiate analgesic: Secondary | ICD-10-CM

## 2018-01-07 DIAGNOSIS — K219 Gastro-esophageal reflux disease without esophagitis: Secondary | ICD-10-CM | POA: Diagnosis present

## 2018-01-07 LAB — COMPREHENSIVE METABOLIC PANEL
ALT: 102 U/L — AB (ref 14–54)
AST: 112 U/L — AB (ref 15–41)
Albumin: 3.3 g/dL — ABNORMAL LOW (ref 3.5–5.0)
Alkaline Phosphatase: 89 U/L (ref 38–126)
Anion gap: 9 (ref 5–15)
BUN: 52 mg/dL — ABNORMAL HIGH (ref 6–20)
CHLORIDE: 97 mmol/L — AB (ref 101–111)
CO2: 27 mmol/L (ref 22–32)
CREATININE: 0.97 mg/dL (ref 0.44–1.00)
Calcium: 8.6 mg/dL — ABNORMAL LOW (ref 8.9–10.3)
GFR calc non Af Amer: 52 mL/min — ABNORMAL LOW (ref >60)
Glucose, Bld: 104 mg/dL — ABNORMAL HIGH (ref 65–99)
Potassium: 5.2 mmol/L — ABNORMAL HIGH (ref 3.5–5.1)
SODIUM: 133 mmol/L — AB (ref 135–145)
Total Bilirubin: 0.6 mg/dL (ref 0.3–1.2)
Total Protein: 7.1 g/dL (ref 6.5–8.1)

## 2018-01-07 LAB — URINALYSIS, COMPLETE (UACMP) WITH MICROSCOPIC
BILIRUBIN URINE: NEGATIVE
GLUCOSE, UA: NEGATIVE mg/dL
HGB URINE DIPSTICK: NEGATIVE
KETONES UR: NEGATIVE mg/dL
Nitrite: NEGATIVE
PH: 5 (ref 5.0–8.0)
Protein, ur: NEGATIVE mg/dL
Specific Gravity, Urine: 1.008 (ref 1.005–1.030)

## 2018-01-07 LAB — BLOOD GAS, VENOUS
ACID-BASE EXCESS: 3.8 mmol/L — AB (ref 0.0–2.0)
Bicarbonate: 31.6 mmol/L — ABNORMAL HIGH (ref 20.0–28.0)
FIO2: 0.21
Patient temperature: 37
pCO2, Ven: 60 mmHg (ref 44.0–60.0)
pH, Ven: 7.33 (ref 7.250–7.430)
pO2, Ven: <31 mmHg — CL (ref 32.0–45.0)

## 2018-01-07 LAB — LIPID PANEL
Cholesterol: 125 mg/dL (ref 0–200)
HDL: 27 mg/dL — ABNORMAL LOW (ref >40)
LDL CALC: 76 mg/dL (ref 0–99)
TRIGLYCERIDES: 109 mg/dL (ref <150)
Total CHOL/HDL Ratio: 4.6 RATIO
VLDL: 22 mg/dL (ref 0–40)

## 2018-01-07 LAB — PROTIME-INR
INR: 1.13
Prothrombin Time: 14.4 seconds (ref 11.4–15.2)

## 2018-01-07 LAB — CBC WITH DIFFERENTIAL/PLATELET
BASOS ABS: 0 10*3/uL (ref 0–0.1)
BASOS PCT: 0 %
EOS ABS: 0 10*3/uL (ref 0–0.7)
EOS PCT: 0 %
HCT: 44.5 % (ref 35.0–47.0)
Hemoglobin: 14.2 g/dL (ref 12.0–16.0)
Lymphocytes Relative: 11 %
Lymphs Abs: 0.8 10*3/uL — ABNORMAL LOW (ref 1.0–3.6)
MCH: 31.2 pg (ref 26.0–34.0)
MCHC: 31.9 g/dL — ABNORMAL LOW (ref 32.0–36.0)
MCV: 97.9 fL (ref 80.0–100.0)
Monocytes Absolute: 0.8 10*3/uL (ref 0.2–0.9)
Monocytes Relative: 11 %
Neutro Abs: 5.3 10*3/uL (ref 1.4–6.5)
Neutrophils Relative %: 78 %
PLATELETS: 234 10*3/uL (ref 150–440)
RBC: 4.55 MIL/uL (ref 3.80–5.20)
RDW: 14.8 % — ABNORMAL HIGH (ref 11.5–14.5)
WBC: 6.8 10*3/uL (ref 3.6–11.0)

## 2018-01-07 LAB — LACTIC ACID, PLASMA: Lactic Acid, Venous: 1.7 mmol/L (ref 0.5–1.9)

## 2018-01-07 LAB — TROPONIN I
Troponin I: 0.99 ng/mL (ref <0.03)
Troponin I: 1.63 ng/mL (ref <0.03)

## 2018-01-07 LAB — HEPARIN LEVEL (UNFRACTIONATED): Heparin Unfractionated: <0.1 IU/mL — ABNORMAL LOW (ref 0.30–0.70)

## 2018-01-07 LAB — BRAIN NATRIURETIC PEPTIDE: B Natriuretic Peptide: 3070 pg/mL — ABNORMAL HIGH (ref 0.0–100.0)

## 2018-01-07 LAB — APTT: APTT: 29 s (ref 24–36)

## 2018-01-07 MED ORDER — ACETAMINOPHEN 325 MG PO TABS: 650.0000 mg | ORAL_TABLET | Freq: Four times a day (QID) | ORAL | Status: DC | PRN

## 2018-01-07 MED ORDER — HEPARIN (PORCINE) IN NACL 100-0.45 UNIT/ML-% IJ SOLN
650.0000 [IU]/h | INTRAMUSCULAR | Status: DC
  Administered 2018-01-07: 500 [IU]/h via INTRAVENOUS
  Filled 2018-01-07: qty 250

## 2018-01-07 MED ORDER — GABAPENTIN 100 MG PO CAPS
100.0000 mg | ORAL_CAPSULE | Freq: Three times a day (TID) | ORAL | Status: DC
  Administered 2018-01-07 – 2018-01-10 (×10): 100 mg via ORAL
  Filled 2018-01-07 (×10): qty 1

## 2018-01-07 MED ORDER — SODIUM CHLORIDE 0.9% FLUSH: 3.0000 mL | INTRAVENOUS | Status: DC | PRN

## 2018-01-07 MED ORDER — ASPIRIN 81 MG PO CHEW: 81.0000 mg | CHEWABLE_TABLET | Freq: Every day | ORAL | Status: DC

## 2018-01-07 MED ORDER — SENNOSIDES-DOCUSATE SODIUM 8.6-50 MG PO TABS
1.0000 | ORAL_TABLET | Freq: Every evening | ORAL | Status: DC | PRN
  Administered 2018-01-08: 1 via ORAL
  Filled 2018-01-07: qty 1

## 2018-01-07 MED ORDER — MIRABEGRON ER 25 MG PO TB24
25.0000 mg | ORAL_TABLET | Freq: Every day | ORAL | Status: DC
  Administered 2018-01-08 – 2018-01-10 (×3): 25 mg via ORAL
  Filled 2018-01-07 (×4): qty 1

## 2018-01-07 MED ORDER — FUROSEMIDE 10 MG/ML IJ SOLN
80.0000 mg | Freq: Two times a day (BID) | INTRAMUSCULAR | Status: DC
  Administered 2018-01-07 – 2018-01-08 (×3): 80 mg via INTRAVENOUS
  Filled 2018-01-07 (×3): qty 8

## 2018-01-07 MED ORDER — METOPROLOL TARTRATE 25 MG PO TABS
12.5000 mg | ORAL_TABLET | Freq: Two times a day (BID) | ORAL | Status: DC
  Administered 2018-01-07 – 2018-01-10 (×7): 12.5 mg via ORAL
  Filled 2018-01-07 (×7): qty 1

## 2018-01-07 MED ORDER — SODIUM CHLORIDE 0.9% FLUSH
3.0000 mL | Freq: Two times a day (BID) | INTRAVENOUS | Status: DC
  Administered 2018-01-07 – 2018-01-10 (×7): 3 mL via INTRAVENOUS

## 2018-01-07 MED ORDER — HYDRALAZINE HCL 20 MG/ML IJ SOLN: 10.0000 mg | Freq: Four times a day (QID) | INTRAMUSCULAR | Status: DC | PRN

## 2018-01-07 MED ORDER — FUROSEMIDE 10 MG/ML IJ SOLN
40.0000 mg | Freq: Once | INTRAMUSCULAR | Status: AC
  Administered 2018-01-07: 40 mg via INTRAVENOUS
  Filled 2018-01-07: qty 4

## 2018-01-07 MED ORDER — TRAMADOL HCL 50 MG PO TABS
50.0000 mg | ORAL_TABLET | Freq: Four times a day (QID) | ORAL | Status: DC | PRN
  Administered 2018-01-07: 50 mg via ORAL
  Filled 2018-01-07: qty 1

## 2018-01-07 MED ORDER — ONDANSETRON HCL 4 MG PO TABS: 4.0000 mg | ORAL_TABLET | Freq: Four times a day (QID) | ORAL | Status: DC | PRN

## 2018-01-07 MED ORDER — ACETAMINOPHEN 650 MG RE SUPP: 650.0000 mg | Freq: Four times a day (QID) | RECTAL | Status: DC | PRN

## 2018-01-07 MED ORDER — ADULT MULTIVITAMIN W/MINERALS CH
ORAL_TABLET | Freq: Every day | ORAL | Status: DC
  Administered 2018-01-08 – 2018-01-10 (×3): 1 via ORAL
  Filled 2018-01-07 (×3): qty 1

## 2018-01-07 MED ORDER — HEPARIN BOLUS VIA INFUSION
1300.0000 [IU] | Freq: Once | INTRAVENOUS | Status: AC
  Administered 2018-01-07: 1300 [IU] via INTRAVENOUS
  Filled 2018-01-07: qty 1300

## 2018-01-07 MED ORDER — NITROGLYCERIN 2 % TD OINT
0.5000 [in_us] | TOPICAL_OINTMENT | Freq: Four times a day (QID) | TRANSDERMAL | Status: DC
  Filled 2018-01-07 (×2): qty 1

## 2018-01-07 MED ORDER — ALBUTEROL SULFATE (2.5 MG/3ML) 0.083% IN NEBU: 2.5000 mg | INHALATION_SOLUTION | Freq: Four times a day (QID) | RESPIRATORY_TRACT | Status: DC | PRN

## 2018-01-07 MED ORDER — POTASSIUM CHLORIDE CRYS ER 10 MEQ PO TBCR
10.0000 meq | EXTENDED_RELEASE_TABLET | Freq: Four times a day (QID) | ORAL | Status: DC
  Administered 2018-01-07 – 2018-01-10 (×13): 10 meq via ORAL
  Filled 2018-01-07 (×14): qty 1

## 2018-01-07 MED ORDER — CALCIUM CARBONATE ANTACID 500 MG PO CHEW
2.5000 | CHEWABLE_TABLET | Freq: Two times a day (BID) | ORAL | Status: DC
  Filled 2018-01-07 (×5): qty 3

## 2018-01-07 MED ORDER — BISACODYL 5 MG PO TBEC: 5.0000 mg | DELAYED_RELEASE_TABLET | Freq: Every day | ORAL | Status: DC | PRN

## 2018-01-07 MED ORDER — LEVOTHYROXINE SODIUM 50 MCG PO TABS
25.0000 ug | ORAL_TABLET | Freq: Every day | ORAL | Status: DC
  Administered 2018-01-08 – 2018-01-11 (×4): 25 ug via ORAL
  Filled 2018-01-07 (×4): qty 1

## 2018-01-07 MED ORDER — SODIUM CHLORIDE 0.9 % IV SOLN: 250.0000 mL | INTRAVENOUS | Status: DC | PRN

## 2018-01-07 MED ORDER — ONDANSETRON HCL 4 MG/2ML IJ SOLN: 4.0000 mg | Freq: Four times a day (QID) | INTRAMUSCULAR | Status: DC | PRN

## 2018-01-07 MED ORDER — CALCIUM GLUCONATE 500 MG PO CAPS: 2.0000 | ORAL_CAPSULE | Freq: Every day | ORAL | Status: DC

## 2018-01-07 MED ORDER — ASPIRIN 325 MG PO TABS
325.0000 mg | ORAL_TABLET | Freq: Every day | ORAL | Status: DC
  Administered 2018-01-08 – 2018-01-10 (×3): 325 mg via ORAL
  Filled 2018-01-07 (×4): qty 1

## 2018-01-07 MED ORDER — ROSUVASTATIN CALCIUM 10 MG PO TABS
10.0000 mg | ORAL_TABLET | Freq: Every day | ORAL | Status: DC
  Administered 2018-01-08 – 2018-01-10 (×3): 10 mg via ORAL
  Filled 2018-01-07 (×3): qty 1

## 2018-01-07 MED ORDER — TRAMADOL HCL 50 MG PO TABS: 50.0000 mg | ORAL_TABLET | Freq: Four times a day (QID) | ORAL | Status: DC | PRN

## 2018-01-07 MED ORDER — HEPARIN BOLUS VIA INFUSION
2600.0000 [IU] | Freq: Once | INTRAVENOUS | Status: AC
  Administered 2018-01-07: 2600 [IU] via INTRAVENOUS
  Filled 2018-01-07: qty 2600

## 2018-01-07 MED ORDER — OYSTER CALCIUM 500 MG PO TABS
500.0000 mg | ORAL_TABLET | Freq: Two times a day (BID) | ORAL | Status: DC
  Filled 2018-01-07: qty 1

## 2018-01-07 NOTE — H&P (Addendum)
[place] [facility] [place] NAME: Jennifer Skinner    MR#:  [id number]  DATE OF BIRTH:  26-Sep-1931  DATE OF ADMISSION:  01/07/2018  PRIMARY CARE PHYSICIAN: Olin Hauser, DO   REQUESTING/REFERRING PHYSICIAN: dr Dineen Kid  CHIEF COMPLAINT:   SOB HISTORY OF PRESENT ILLNESS:  Jennifer Skinner  is a 84 y.o. female with a known history of chronic diastolic heart failure with preserved EF, COPD not on O2 who presents from [facility] CH[facility] clinic with complaints of increasing lower extremity edema, shortness of breath, dyspnea exertion, PND and orthopnea. Patient was sent here for further evaluation. In the emergency room BNP is elevated. Chest x-ray shows pulmonary edema. She is also noted have elevated troponin. She has been started on heparin drip ER physician. Patient denies chest pain. Her symptoms have worsened over the past week.  PAST MEDICAL HISTORY:   Past Medical History:  Diagnosis Date  . 1st degree AV block   . Arthritis   . Chronic diastolic CHF (congestive heart failure) ([place])    a. echo 2014: EF 55-60%, nl wall motion, GR1DD, mild MR, moder mitral prolapse, PASP 40 mm Hg; b. echo 09/2015: EF 60-65%, nl wall motion, GR1DD, mod MR, mild to mod TR, PASP 56 mm Hg  . COPD (chronic obstructive pulmonary disease) ([place])   . Degenerative disc disease, lumbar   . Gastro-esophageal reflux   . Mitral prolapse   . Mitral regurgitation   . Osteoporosis     PAST SURGICAL HISTORY:   Past Surgical History:  Procedure Laterality Date  . ABDOMINAL HYSTERECTOMY    . APPENDECTOMY    . DILATION AND CURETTAGE OF UTERUS    . HYSTEROTOMY    . right breast biopsy    . right eye      surgery  . TONSILLECTOMY      SOCIAL HISTORY:   Social History   Tobacco Use  . Smoking status: Never Smoker  . Smokeless tobacco: Never Used  Substance Use Topics  . Alcohol use: No    Alcohol/week: 0.0 oz    FAMILY HISTORY:   Family History   Problem Relation Age of Onset  . CVA Mother 49  . Heart attack Father 3  . Throat cancer Sister   . CAD Brother     DRUG ALLERGIES:   Allergies  Allergen Reactions  . Penicillins     Rash     REVIEW OF SYSTEMS:   Review of Systems  Constitutional: Positive for malaise/fatigue. Negative for chills and fever.  HENT: Negative.  Negative for ear discharge, ear pain, hearing loss, nosebleeds and sore throat.   Eyes: Negative.  Negative for blurred vision and pain.  Respiratory: Positive for shortness of breath. Negative for cough, hemoptysis and wheezing.   Cardiovascular: Positive for leg swelling and PND. Negative for chest pain and palpitations.  Gastrointestinal: Negative.  Negative for abdominal pain, blood in stool, diarrhea, nausea and vomiting.  Genitourinary: Negative.  Negative for dysuria.  Musculoskeletal: Negative.  Negative for back pain.  Skin: Negative.   Neurological: Negative for dizziness, tremors, speech change, focal weakness, seizures and headaches.  Endo/Heme/Allergies: Negative.  Does not bruise/bleed easily.  Psychiatric/Behavioral: Negative.  Negative for depression, hallucinations and suicidal ideas.    MEDICATIONS AT HOME:   Prior to Admission medications   Medication Sig Start Date End Date Taking? Authorizing Provider  acetaminophen (TYLENOL) 500 MG tablet Take 500 mg by mouth every 6 (six) hours as needed.  Yes [provider]  albuterol (PROVENTIL) (2.5 MG/3ML) 0.083% nebulizer solution Take 3 mLs (2.5 mg total) by nebulization every 6 (six) hours as needed for wheezing or shortness of breath. 10/12/15  Yes Epifanio Lesches, MD  Apple Cider Vinegar (APPLE CIDER VINEGAR ULTRA) 600 MG CAPS 2 teaspoons once a day   Yes [provider]  aspirin 325 MG tablet Take 325 mg by mouth daily.   Yes [provider]  baclofen (LIORESAL) 10 MG tablet Take 0.5-1 tablets (5-10 mg total) by mouth 3 (three) times daily as needed for  muscle spasms. 08/19/17  Yes Karamalegos, Devonne Doughty, DO  Calcium Gluconate 500 MG CAPS Take 2 capsules by mouth daily.    Yes [provider]  gabapentin (NEURONTIN) 100 MG capsule Take 1 capsule (100 mg total) by mouth 3 (three) times daily. 06/04/17  Yes Karamalegos, Devonne Doughty, DO  levothyroxine (SYNTHROID, LEVOTHROID) 25 MCG tablet Take 1 tablet (25 mcg total) by mouth daily before breakfast. Ran out of meds. 02/20/17  Yes Arlis Porta., MD  metoprolol tartrate (LOPRESSOR) 25 MG tablet Take 0.5 tablets (12.5 mg total) by mouth 2 (two) times daily. 03/12/17  Yes Karamalegos, Devonne Doughty, DO  mirabegron ER (MYRBETRIQ) 25 MG TB24 tablet Take 1 tablet (25 mg total) by mouth daily. 09/05/17  Yes Karamalegos, Devonne Doughty, DO  Multiple Vitamin (MULTI-VITAMIN DAILY PO) Take 1 tablet by mouth daily.    Yes [provider]  [name] (OYSTER CALCIUM) 500 MG TABS Take 500 mg of elemental calcium by mouth 2 (two) times daily.   Yes [provider]  polyethylene glycol powder (GLYCOLAX/MIRALAX) powder Take 17-34 g by mouth daily as needed. 08/19/17  Yes Karamalegos, Devonne Doughty, DO  potassium chloride (K-DUR) 10 MEQ tablet Take 1 tablet (10 mEq total) by mouth 4 (four) times daily. 12/17/17  Yes Gollan, Kathlene November, MD  traMADol (ULTRAM) 50 MG tablet Take 1 tablet by mouth every 6 (six) hours as needed for moderate pain.  08/23/14  Yes [provider]  torsemide (DEMADEX) 20 MG tablet Take 2 tablets (40 mg total) 2 (two) times daily by mouth. 10/28/17 11/27/17  Alisa Graff, FNP      VITAL SIGNS:  Blood pressure 116/74, pulse 79, temperature 97.8 F (36.6 C), temperature source Oral, resp. rate 18, SpO2 94 %.  PHYSICAL EXAMINATION:   Physical Exam  Constitutional: She is oriented to person, place, and time and well-developed, well-nourished, and in no distress. No distress.  HENT:  Head: Normocephalic.  Eyes: No scleral icterus.  Neck: Normal range of motion.  Neck supple. JVD present. No tracheal deviation present.  Cardiovascular: Normal rate and regular rhythm. Exam reveals no gallop and no friction rub.  Murmur heard. Pulmonary/Chest: Effort normal and breath sounds normal. No respiratory distress. She has no wheezes. She has no rales. She exhibits no tenderness.  Decreased breath sounds throughout lung fields.  Abdominal: Soft. Bowel sounds are normal. She exhibits no distension and no mass. There is no tenderness. There is no rebound and no guarding.  Musculoskeletal: Normal range of motion. She exhibits edema.  Neurological: She is alert and oriented to person, place, and time.  Skin: Skin is warm. No rash noted. No erythema.  Psychiatric: Affect and judgment normal.      LABORATORY PANEL:   CBC Recent Labs  Lab 01/07/18 1042  WBC 6.8  HGB 14.2  HCT 44.5  PLT 234   ------------------------------------------------------------------------------------------------------------------  Chemistries  Recent Labs  Lab 01/07/18 1042  NA 133*  K 5.2*  CL 97*  CO2 27  GLUCOSE 104*  BUN 52*  CREATININE 0.97  CALCIUM 8.6*  AST 112*  ALT 102*  ALKPHOS 89  BILITOT 0.6   ------------------------------------------------------------------------------------------------------------------  Cardiac Enzymes Recent Labs  Lab 01/07/18 1042  TROPONINI 0.99*   ------------------------------------------------------------------------------------------------------------------  RADIOLOGY:  Dg Chest 1 View  Result Date: 01/07/2018 CLINICAL DATA:  Generalize weakness EXAM: CHEST 1 VIEW COMPARISON:  11/08/2015 FINDINGS: Small right pleural effusion. Diffuse bilateral interstitial thickening. No pneumothorax. Stable cardiomegaly. No acute osseous abnormality. IMPRESSION: Mild CHF. Electronically Signed   By: Kathreen Devoid   On: 01/07/2018 11:10    EKG:  t wave inversion in inferior leads Incomplete right bundle branch block  Normal sinus  rhythm No ST elevation or depression  IMPRESSION AND PLAN:   84 year old female with history of chronic diastolic heart failure and COPD not requiring oxygen who presents with shortness of breath, lower extremity edema, PND and orthopnea and also found to have new oxygen requirement.   1. Acute hypoxic respiratory failure in the setting of acute on chronic diastolic heart failure Wean oxygen to baseline room air  2. Acute on chronic diastolic heart failure: Patient presents with typical symptoms of CHF exacerbation as well as JVD and elevated BNP along with chest x-ray consistent with pulmonary edema Continue Lasix 40 mg IV daily Monitor intake and output with daily weight [facility] cardiology consultation requested Repeat echocardiogram as the last one was in 2016  3. Elevated troponin: Non-STEMI versus demand ischemia Follow telemetry and troponins Echocardiogram ordered Case discussed with cardiology Continue heparin drip aspirin and statin Check lipid panel and A1c Further recreations after cardiology evaluation Continue metoprolol   4. COPD without signs of exacerbation  5. Hypothyroid: Continue Synthroid 6. elevated LFTS may be from hepatic congestion Repeat in am and consider ultrasound abdomen if needed   All the records are reviewed and case discussed with ED provider. Management plans discussed with the patient and she is in agreement  CODE STATUS: full  TOTAL TIME TAKING CARE OF THIS PATIENT: 40 minutes.    Jennifer Skinner M.D on 01/07/2018 at 12:21 PM  Between 7am to 6pm - Pager - [phone]  After 6pm go to [email or web address] - password [email or web address] [email or web address] [facility]  Office  [phone]  CC: Primary care physician; Olin Hauser, DO

## 2018-01-07 NOTE — ED Provider Notes (Signed)
[facility] Emergency Department Provider Note  ____________________________________________   First MD Initiated Contact with Patient 01/07/18 1037     (approximate)  I have reviewed the triage vital signs and the nursing notes.   HISTORY  Chief Complaint Weakness   HPI Jennifer Skinner is a 84 y.o. female with a history of COPD as well as CHF who is presenting with several weeks of worsening fatigue and weakness.  She is denying any pain.  Says that she feels weak to her bilateral lower extremities and that she has experienced increased swelling to her bilateral lower extremities over the past several weeks.  She was brought in this morning at the request of her husband who says that she has been progressively weaker in the office falls asleep when she is sitting, letting her head fall down to her chest.  In triage she was found to be hypoxic to the 60s on room air.  She is denying any shortness of breath or chest pain.  Past Medical History:  Diagnosis Date  . 1st degree AV block   . Arthritis   . Chronic diastolic CHF (congestive heart failure) ([place])    a. echo 2014: EF 55-60%, nl wall motion, GR1DD, mild MR, moder mitral prolapse, PASP 40 mm Hg; b. echo 09/2015: EF 60-65%, nl wall motion, GR1DD, mod MR, mild to mod TR, PASP 56 mm Hg  . COPD (chronic obstructive pulmonary disease) ([place])   . Degenerative disc disease, lumbar   . Gastro-esophageal reflux   . Mitral prolapse   . Mitral regurgitation   . Osteoporosis     Patient Active Problem List   Diagnosis Date Noted  . Acute diastolic heart failure ([place]) 10/28/2017  . Lymphedema 10/28/2017  . Hyperlipidemia 10/08/2017  . Chronic back pain 08/26/2017  . Other constipation 08/19/2017  . Essential hypertension 03/13/2016  . COPD (chronic obstructive pulmonary disease) ([place])   . Chronic diastolic CHF (congestive heart failure) ([place])   . 1st degree AV block   . Interstitial lung disease ([place])  10/17/2015  . Hypothyroidism 09/27/2015  . Lumbar and sacral osteoarthritis 03/11/2015  . Degeneration of intervertebral disc of lumbar region 07/13/2014  . Lumbar radiculitis 07/13/2014  . Lumbar stenosis with neurogenic claudication 07/13/2014  . History of neutropenia 03/31/2013  . ION (ischemic optic neuropathy) 09/30/2012  . OP (osteoporosis) 09/30/2012  . MITRAL REGURGITATION 04/12/2009  . Mitral valve disorder 04/12/2009  . GERD 04/12/2009    Past Surgical History:  Procedure Laterality Date  . ABDOMINAL HYSTERECTOMY    . APPENDECTOMY    . DILATION AND CURETTAGE OF UTERUS    . HYSTEROTOMY    . right breast biopsy    . right eye      surgery  . TONSILLECTOMY      Prior to Admission medications   Medication Sig Start Date End Date Taking? Authorizing Provider  acetaminophen (TYLENOL) 500 MG tablet Take 500 mg by mouth every 6 (six) hours as needed.   Yes [provider]  albuterol (PROVENTIL) (2.5 MG/3ML) 0.083% nebulizer solution Take 3 mLs (2.5 mg total) by nebulization every 6 (six) hours as needed for wheezing or shortness of breath. 10/12/15  Yes Epifanio Lesches, MD  Apple Cider Vinegar (APPLE CIDER VINEGAR ULTRA) 600 MG CAPS 2 teaspoons once a day   Yes [provider]  aspirin 325 MG tablet Take 325 mg by mouth daily.   Yes [provider]  baclofen (LIORESAL) 10 MG tablet Take 0.5-1  tablets (5-10 mg total) by mouth 3 (three) times daily as needed for muscle spasms. 08/19/17  Yes Karamalegos, Devonne Doughty, DO  Calcium Gluconate 500 MG CAPS Take 2 capsules by mouth daily.    Yes [provider]  gabapentin (NEURONTIN) 100 MG capsule Take 1 capsule (100 mg total) by mouth 3 (three) times daily. 06/04/17  Yes Karamalegos, Devonne Doughty, DO  levothyroxine (SYNTHROID, LEVOTHROID) 25 MCG tablet Take 1 tablet (25 mcg total) by mouth daily before breakfast. Ran out of meds. 02/20/17  Yes Arlis Porta., MD  metoprolol tartrate  (LOPRESSOR) 25 MG tablet Take 0.5 tablets (12.5 mg total) by mouth 2 (two) times daily. 03/12/17  Yes Karamalegos, Devonne Doughty, DO  mirabegron ER (MYRBETRIQ) 25 MG TB24 tablet Take 1 tablet (25 mg total) by mouth daily. 09/05/17  Yes Karamalegos, Devonne Doughty, DO  Multiple Vitamin (MULTI-VITAMIN DAILY PO) Take 1 tablet by mouth daily.    Yes [provider]  [name] (OYSTER CALCIUM) 500 MG TABS Take 500 mg of elemental calcium by mouth 2 (two) times daily.   Yes [provider]  polyethylene glycol powder (GLYCOLAX/MIRALAX) powder Take 17-34 g by mouth daily as needed. 08/19/17  Yes Karamalegos, Devonne Doughty, DO  potassium chloride (K-DUR) 10 MEQ tablet Take 1 tablet (10 mEq total) by mouth 4 (four) times daily. 12/17/17  Yes Gollan, Kathlene November, MD  traMADol (ULTRAM) 50 MG tablet Take 1 tablet by mouth every 6 (six) hours as needed for moderate pain.  08/23/14  Yes [provider]  torsemide (DEMADEX) 20 MG tablet Take 2 tablets (40 mg total) 2 (two) times daily by mouth. 10/28/17 11/27/17  Alisa Graff, FNP    Allergies Penicillins  Family History  Problem Relation Age of Onset  . CVA Mother 74  . Heart attack Father 71  . Throat cancer Sister   . CAD Brother     Social History Social History   Tobacco Use  . Smoking status: Never Smoker  . Smokeless tobacco: Never Used  Substance Use Topics  . Alcohol use: No    Alcohol/week: 0.0 oz  . Drug use: No    Review of Systems  Constitutional: No fever/chills Eyes: No visual changes. ENT: No sore throat. Cardiovascular: Denies chest pain. Respiratory: Denies shortness of breath. Gastrointestinal: No abdominal pain.  No nausea, no vomiting.  No diarrhea.  No constipation. Genitourinary: Negative for dysuria. Musculoskeletal: Negative for back pain. Skin: Negative for rash. Neurological: Negative for headaches, focal weakness or numbness.   ____________________________________________   PHYSICAL  EXAM:  VITAL SIGNS: ED Triage Vitals  Enc Vitals Group     BP 01/07/18 1028 (!) 102/50     Pulse Rate 01/07/18 1028 78     Resp 01/07/18 1028 16     Temp 01/07/18 1028 97.8 F (36.6 C)     Temp Source 01/07/18 1028 Oral     SpO2 01/07/18 1028 (!) 69 %     Weight --      Height --      Head Circumference --      Peak Flow --      Pain Score 01/07/18 1048 0     Pain Loc --      Pain Edu? --      Excl. in [place]? --     Constitutional: Alert and oriented. Well appearing and in no acute distress. Eyes: Conjunctivae are normal.  Head: Atraumatic. Nose: No congestion/rhinnorhea. Mouth/Throat: Mucous membranes are moist.  Neck: No stridor.   Cardiovascular: Normal rate, regular rhythm.  Ejection murmur heard best over the left sternal border.  Grossly normal heart sounds.   Respiratory: Normal respiratory effort.  No retractions.  Rales to bilateral lower fields. Gastrointestinal: Soft and nontender. No distention.  Musculoskeletal: Moderate bilateral lower extremity edema to the bilateral calves and ankles.  No joint effusions.  Scoliosis noted on gross examination of the back. Neurologic:  Normal speech and language. No gross focal neurologic deficits are appreciated.  5 out of 5 strength of bilateral lower extremities.  Sensation is intact light touch the bilateral lower extremities. Skin:  Skin is warm, dry and intact. No rash noted. Psychiatric: Mood and affect are normal. Speech and behavior are normal.  ____________________________________________   LABS (all labs ordered are listed, but only abnormal results are displayed)  Labs Reviewed  CBC WITH DIFFERENTIAL/PLATELET - Abnormal; Notable for the following components:      Result Value   MCHC 31.9 (*)    RDW 14.8 (*)    Lymphs Abs 0.8 (*)    All other components within normal limits  COMPREHENSIVE METABOLIC PANEL - Abnormal; Notable for the following components:   Sodium 133 (*)    Potassium 5.2 (*)    Chloride 97 (*)     Glucose, Bld 104 (*)    BUN 52 (*)    Calcium 8.6 (*)    Albumin 3.3 (*)    AST 112 (*)    ALT 102 (*)    GFR calc non Af Amer 52 (*)    All other components within normal limits  TROPONIN I - Abnormal; Notable for the following components:   Troponin I 0.99 (*)    All other components within normal limits  BRAIN NATRIURETIC PEPTIDE - Abnormal; Notable for the following components:   B Natriuretic Peptide 3,070.0 (*)    All other components within normal limits  BLOOD GAS, VENOUS - Abnormal; Notable for the following components:   pO2, Ven <31.0 (*)    Bicarbonate 31.6 (*)    Acid-Base Excess 3.8 (*)    All other components within normal limits  URINALYSIS, COMPLETE (UACMP) WITH MICROSCOPIC   ____________________________________________  EKG  ED ECG REPORT I, Doran Stabler, the attending physician, personally viewed and interpreted this ECG.   Date: 01/07/2018  EKG Time: 1043  Rate: 79  Rhythm: normal sinus rhythm  Axis: Normal  Intervals:Incomplete right bundle branch block and a left posterior fascicular block.  ST&T Change: T wave inversions to 3, 2 and aVF as well as V3 through V5. No significant change of this EKG when compared last on the record of 15 October 2017. ____________________________________________  RADIOLOGY  Mild CHF chest x-ray. ____________________________________________   PROCEDURES  Procedure(s) performed:   .Critical Care Performed by: Orbie Pyo, MD Authorized by: Orbie Pyo, MD   Critical care provider statement:    Critical care time (minutes):  35   Critical care was necessary to treat or prevent imminent or life-threatening deterioration of the following conditions:  Respiratory failure   Critical care was time spent personally by me on the following activities:  Ordering and review of radiographic studies, ordering and review of laboratory studies and ordering and performing treatments and  interventions    Critical Care performed:   ____________________________________________   INITIAL IMPRESSION / ASSESSMENT AND PLAN / ED COURSE  Pertinent labs & imaging results that were available during my care of the patient were reviewed by me  and considered in my medical decision making (see chart for details).  Differential diagnosis includes, but is not limited to, alcohol, illicit or prescription medications, or other toxic ingestion; intracranial pathology such as stroke or intracerebral hemorrhage; fever or infectious causes including sepsis; hypoxemia and/or hypercarbia; uremia; trauma; endocrine related disorders such as diabetes, hypoglycemia, and thyroid-related diseases; hypertensive encephalopathy; etc. Differential includes, but is not limited to, viral syndrome, bronchitis including COPD exacerbation, pneumonia, reactive airway disease including asthma, CHF including exacerbation with or without pulmonary/interstitial edema, pneumothorax, ACS, thoracic trauma, and pulmonary embolism. As part of my medical decision making, I reviewed the following data within the [place] chart reviewed and Notes from prior ED visits   ----------------------------------------- 12:14 PM on 01/07/2018 -----------------------------------------  BNP of 3000 with a troponin of 0.9.  Critically low oxygen on ABG.  Patient will be admitted to the hospital.  We will wean off from nonrebreather as tolerated.  Patient percent pulse ox at this time.  Due to patient's 0.9 The patient will be started on heparin.  She denies any bleeding in her stool or history of GI bleeding.  Signed out to Dr. Anselm Jungling.  Patient and family are understanding of the plan willing to comply with admission.  Given 40 mg of Lasix IV.     ____________________________________________   FINAL CLINICAL IMPRESSION(S) / ED DIAGNOSES  CHF.  Hypoxia.  NSTEMI.    NEW MEDICATIONS STARTED DURING THIS  VISIT:  New Prescriptions   No medications on file     Note:  This document was prepared using Dragon voice recognition software and may include unintentional dictation errors.     Orbie Pyo, MD 01/07/18 1215

## 2018-01-07 NOTE — Progress Notes (Signed)
Family Meeting Note  Advance Directive:yes  Today a meeting took place with the Patient.and spouse The following clinical team members were present during this meeting:MD ER RN The following were discussed:Patient's diagnosis CHF  Acute hypoxic resp failure Elevated troponin : , Patient's progosis: Unable to determine and Goals for treatment: Full Code  Additional follow-up to be provided: none at this time Family to bring in advanced directives  Time spent during discussion:20 minutes  Hollie Wojahn, MD

## 2018-01-07 NOTE — ED Notes (Signed)
Pt placed on NRB.

## 2018-01-07 NOTE — Progress Notes (Addendum)
Patient ID: Jennifer Skinner, female    DOB: 14-Aug-1931, 84 y.o.   MRN: [id number]  HPI  Jennifer Skinner is a 84 y/o female who has a history of MV prolapse, COPD, HTN, GERD and HF with an EF of 60-65% who returns for follow-up today.    Last echo done 10/11/15 with an EF of 60-65% with moderate mitral regurgitation but without aortic stenosis. Mild to moderate TR,  PASP 56mg  Hg.  Was in the ED 09/05/17 for UTI where she was treated and released.   Returns today for an acute care visit with a chief complaint of increased edema in lower legs. She and her husband also note that patient has been extremely drowsy for the last couple of weeks where she falls asleep even while talking to [place]. She says that she's decreased her fluid intake per her cardiologist recommendation. Has associated fatigue, dizziness, shortness of breath, weakness in her legs and weight gain. Denies any chest pain, cough or palpitations.   Past Medical History:  Diagnosis Date  . 1st degree AV block   . Arthritis   . Chronic diastolic CHF (congestive heart failure) ([place])    a. echo 2014: EF 55-60%, nl wall motion, GR1DD, mild MR, moder mitral prolapse, PASP 40 mm Hg; b. echo 09/2015: EF 60-65%, nl wall motion, GR1DD, mod MR, mild to mod TR, PASP 56 mm Hg  . COPD (chronic obstructive pulmonary disease) ([place])   . Degenerative disc disease, lumbar   . Gastro-esophageal reflux   . Mitral prolapse   . Mitral regurgitation   . Osteoporosis    Past Surgical History:  Procedure Laterality Date  . ABDOMINAL HYSTERECTOMY    . APPENDECTOMY    . DILATION AND CURETTAGE OF UTERUS    . HYSTEROTOMY    . right breast biopsy    . right eye      surgery  . TONSILLECTOMY     Family History  Problem Relation Age of Onset  . CVA Mother 36  . Heart attack Father 35  . Throat cancer Sister   . CAD Brother    Social History   Tobacco Use  . Smoking status: Never Smoker  . Smokeless tobacco: Never Used  Substance Use Topics  . Alcohol  use: No    Alcohol/week: 0.0 oz   Allergies  Allergen Reactions  . Penicillins     Rash    Prior to Admission medications   Medication Sig Start Date End Date Taking? Authorizing Provider  acetaminophen (TYLENOL) 500 MG tablet Take 500 mg by mouth every 6 (six) hours as needed.   Yes [provider]  albuterol (PROVENTIL) (2.5 MG/3ML) 0.083% nebulizer solution Take 3 mLs (2.5 mg total) by nebulization every 6 (six) hours as needed for wheezing or shortness of breath. 10/12/15  Yes Epifanio Lesches, MD  Apple Cider Vinegar (APPLE CIDER VINEGAR ULTRA) 600 MG CAPS 2 teaspoons once a day   Yes [provider]  aspirin 325 MG tablet Take 325 mg by mouth daily.   Yes [provider]  baclofen (LIORESAL) 10 MG tablet Take 0.5-1 tablets (5-10 mg total) by mouth 3 (three) times daily as needed for muscle spasms. 08/19/17  Yes Karamalegos, Devonne Doughty, DO  Calcium Gluconate 500 MG CAPS Take 2 capsules by mouth daily.    Yes [provider]  gabapentin (NEURONTIN) 100 MG capsule Take 1 capsule (100 mg total) by mouth 3 (three) times daily. 06/04/17  Yes Olin Hauser, DO  levothyroxine (SYNTHROID, LEVOTHROID) 25 MCG tablet Take 1 tablet (25 mcg total) by mouth daily before breakfast. Ran out of meds. 02/20/17  Yes Arlis Porta., MD  metoprolol tartrate (LOPRESSOR) 25 MG tablet Take 0.5 tablets (12.5 mg total) by mouth 2 (two) times daily. 03/12/17  Yes Karamalegos, Devonne Doughty, DO  mirabegron ER (MYRBETRIQ) 25 MG TB24 tablet Take 1 tablet (25 mg total) by mouth daily. 09/05/17  Yes Karamalegos, Devonne Doughty, DO  Multiple Vitamin (MULTI-VITAMIN DAILY PO) Take by mouth daily.   Yes [provider]  [name] (OYSTER CALCIUM) 500 MG TABS Take 500 mg of elemental calcium by mouth 2 (two) times daily.   Yes [provider]  polyethylene glycol powder (GLYCOLAX/MIRALAX) powder Take 17-34 g by mouth daily as needed. 08/19/17  Yes Karamalegos,  Devonne Doughty, DO  potassium chloride (K-DUR) 10 MEQ tablet Take 1 tablet (10 mEq total) by mouth 4 (four) times daily. 12/17/17  Yes Gollan, Kathlene November, MD  traMADol (ULTRAM) 50 MG tablet Take 1 tablet by mouth every 6 (six) hours as needed for moderate pain.  08/23/14  Yes [provider]  torsemide (DEMADEX) 20 MG tablet Take 2 tablets (40 mg total) 2 (two) times daily by mouth. 10/28/17 11/27/17  Alisa Graff, FNP    Review of Systems  Constitutional: Positive for fatigue. Negative for appetite change.  HENT: Negative for congestion, postnasal drip and sore throat.   Eyes: Negative.   Respiratory: Positive for shortness of breath. Negative for cough and chest tightness.   Cardiovascular: Positive for leg swelling (better first thing in the mornings). Negative for chest pain and palpitations.  Gastrointestinal: Negative for abdominal distention and abdominal pain.  Endocrine: Negative.   Genitourinary: Negative.   Musculoskeletal: Positive for back pain. Negative for neck pain.  Skin: Negative.   Allergic/Immunologic: Negative.   Neurological: Positive for dizziness (at times) and weakness (in legs). Negative for light-headedness.  Hematological: Negative for adenopathy. Does not bruise/bleed easily.  Psychiatric/Behavioral: Negative for dysphoric mood and sleep disturbance (sleeping on 1 pillow). The patient is not nervous/anxious.    Vitals:   01/07/18 0925  BP: (!) 108/57  Pulse: 79  Resp: 18  SpO2: (!) 66%  Weight: 96 lb 8 oz (43.8 kg)  Height: 4\' 9"  (1.448 m)   Wt Readings from Last 3 Encounters:  01/07/18 96 lb 8 oz (43.8 kg)  11/27/17 91 lb 4 oz (41.4 kg)  10/28/17 97 lb 4 oz (44.1 kg)   Lab Results  Component Value Date   CREATININE 0.39 (L) 09/10/2017   CREATININE <0.30 (L) 09/02/2017   CREATININE 0.53 (L) 02/21/2017     Physical Exam  Constitutional: She is oriented to person, place, and time. She appears well-developed and well-nourished.  HENT:   Head: Normocephalic and atraumatic.  Neck: Normal range of motion. Neck supple. No JVD present.  Cardiovascular: Normal rate and regular rhythm.  Pulmonary/Chest: Effort normal. She has no wheezes. She has rales (lower lobs).  Abdominal: Soft. She exhibits distension. There is no tenderness.  Musculoskeletal: She exhibits edema (2+ pitting edema in bilateral lower legs). She exhibits no tenderness.  Neurological: She is alert and oriented to person, place, and time.  Skin: Skin is warm and dry.  Psychiatric: She has a normal mood and affect. Thought content normal. She is slowed.  Falls asleep even while talking  Nursing note and vitals reviewed.   Assessment & Plan:  1: Acute on Chronic heart failure with preserved ejection  fraction- - NYHA Class III - mildly fluid overloaded today with increased weight and edema - Not adding salt to her food/reading food labels some - Continues to weigh daily. Reminded to call for an overnight weight gain of >2 pounds or weekly weight gain of >5 pounds - weight up 5 pounds from last visit - Saw cardiologist Rockey Situ) on 10/15/17 - PharmD reviewed medications with the patient - pulse ox ranges from 66-75% on room air. Even when patient would wake up and take deep breaths, her oxygen level never rose past 80% - patient is falling asleep even in the exam room while talking to me and her head drops - discussed how stressful on the heart this low of an oxygen level can be. After discussing this with patient and her husband, will send patient to the ER - ER was called and patient's husband took her in the car. Offered to take in a wheelchair but she defers wanting her husband to take her  2: HTN- - BP stable - Saw PCP [place]) on 10/08/2017 & returns April 2019  3: Lymphedema- - stage 2 - doesn't elevate her legs much and she was encouraged to elevate her legs when she's sitting for long periods - has not gotten TED hose yet; discussed the  importance of either getting those or some sort of support sock - unable to exercise due to severe kyphosis and chronic back pain - discussed compression boots again but patient isn't very interested right now and wants to try the socks first  Patient did not bring her medications nor a list. Each medication was verbally reviewed with the patient and she was encouraged to bring the bottles to every visit to confirm accuracy of list.  Follow-up depending outcome of the ED visit.

## 2018-01-07 NOTE — Patient Instructions (Signed)
Continue weighing daily and call for an overnight weight gain of > 2 pounds or a weekly weight gain of >5 pounds. 

## 2018-01-07 NOTE — Progress Notes (Addendum)
ANTICOAGULATION CONSULT NOTE - Initial Consult  Pharmacy Consult for Heparin  Indication: chest pain/ACS  Allergies  Allergen Reactions  . Penicillins     Rash     Patient Measurements:   Heparin Dosing Weight: 43.8  Vital Signs: Temp: 97.8 F (36.6 C) (01/29 1028) Temp Source: Oral (01/29 1028) BP: 116/74 (01/29 1200) Pulse Rate: 79 (01/29 1212)  Labs: Recent Labs    01/07/18 1042  HGB 14.2  HCT 44.5  PLT 234  CREATININE 0.97  TROPONINI 0.99*    Estimated Creatinine Clearance: 26.3 mL/min (by C-G formula based on SCr of 0.97 mg/dL).   Medical History: Past Medical History:  Diagnosis Date  . 1st degree AV block   . Arthritis   . Chronic diastolic CHF (congestive heart failure) ([place])    a. echo 2014: EF 55-60%, nl wall motion, GR1DD, mild MR, moder mitral prolapse, PASP 40 mm Hg; b. echo 09/2015: EF 60-65%, nl wall motion, GR1DD, mod MR, mild to mod TR, PASP 56 mm Hg  . COPD (chronic obstructive pulmonary disease) ([place])   . Degenerative disc disease, lumbar   . Gastro-esophageal reflux   . Mitral prolapse   . Mitral regurgitation   . Osteoporosis     Medications:   (Not in a hospital admission) Scheduled:  . heparin  2,600 Units Intravenous Once    Assessment: Pharmacy consulted to dose and monitor Heparin in this 84 year old woman being treated for ACS/STEMI Goal of Therapy:  Heparin level 0.3-0.7 units/ml Monitor platelets by anticoagulation protocol: Yes   Plan:  Will give Heparin bolus 2600 units x 1 and will start Heparin gtt rate @ 500 units/hr. Will check Heparin level 8 hours after starting heparin gtt.   Ruweyda Macknight D 01/07/2018,12:27 PM

## 2018-01-07 NOTE — ED Notes (Signed)
Report to [place], RN

## 2018-01-07 NOTE — ED Notes (Signed)
Pt c/o generalized weakness and falling asleep easily over the past week. Pt on RA at home. Applied [place] upon arrival to room, 4L [place]-80%.

## 2018-01-07 NOTE — Progress Notes (Signed)
ANTICOAGULATION CONSULT NOTE - Initial Consult  Pharmacy Consult for Heparin  Indication: chest pain/ACS  Allergies  Allergen Reactions  . Penicillins     Rash     Patient Measurements: Height: 4\' 9"  (144.8 cm) Weight: 94 lb 11.2 oz (43 kg) IBW/kg (Calculated) : 38.6 Heparin Dosing Weight: 43.8  Vital Signs: Temp: 97.9 F (36.6 C) (01/29 1928) BP: 100/48 (01/29 1928) Pulse Rate: 97 (01/29 1928)  Labs: Recent Labs    01/07/18 1042 01/07/18 2105  HGB 14.2  --   HCT 44.5  --   PLT 234  --   APTT 29  --   LABPROT 14.4  --   INR 1.13  --   HEPARINUNFRC  --  <0.10*  CREATININE 0.97  --   TROPONINI 0.99* 1.63*    Estimated Creatinine Clearance: 26.3 mL/min (by C-G formula based on SCr of 0.97 mg/dL).   Medical History: Past Medical History:  Diagnosis Date  . 1st degree AV block   . Arthritis   . Chronic diastolic CHF (congestive heart failure) ([place])    a. echo 2014: EF 55-60%, nl wall motion, GR1DD, mild MR, moder mitral prolapse, PASP 40 mm Hg; b. echo 09/2015: EF 60-65%, nl wall motion, GR1DD, mod MR, mild to mod TR, PASP 56 mm Hg  . COPD (chronic obstructive pulmonary disease) ([place])   . Degenerative disc disease, lumbar   . Gastro-esophageal reflux   . Mitral prolapse   . Mitral regurgitation   . Osteoporosis     Medications:  Medications Prior to Admission  Medication Sig Dispense Refill Last Dose  . acetaminophen (TYLENOL) 500 MG tablet Take 500 mg by mouth every 6 (six) hours as needed.   PRN at PRN  . albuterol (PROVENTIL) (2.5 MG/3ML) 0.083% nebulizer solution Take 3 mLs (2.5 mg total) by nebulization every 6 (six) hours as needed for wheezing or shortness of breath. 75 mL 12 PRN at PRN  . Apple Cider Vinegar (APPLE CIDER VINEGAR ULTRA) 600 MG CAPS 2 teaspoons once a day   01/06/2018 at AM  . aspirin 325 MG tablet Take 325 mg by mouth daily.   01/06/2018 at AM  . baclofen (LIORESAL) 10 MG tablet Take 0.5-1 tablets (5-10 mg total) by mouth 3 (three)  times daily as needed for muscle spasms. 30 each 2 PRN at PRN  . Calcium Gluconate 500 MG CAPS Take 2 capsules by mouth daily.    01/06/2018 at AM  . gabapentin (NEURONTIN) 100 MG capsule Take 1 capsule (100 mg total) by mouth 3 (three) times daily. 270 capsule 3 01/06/2018 at PM  . levothyroxine (SYNTHROID, LEVOTHROID) 25 MCG tablet Take 1 tablet (25 mcg total) by mouth daily before breakfast. Ran out of meds. 90 tablet 3 01/06/2018 at AM  . metoprolol tartrate (LOPRESSOR) 25 MG tablet Take 0.5 tablets (12.5 mg total) by mouth 2 (two) times daily. 90 tablet 3 01/06/2018 at PM  . mirabegron ER (MYRBETRIQ) 25 MG TB24 tablet Take 1 tablet (25 mg total) by mouth daily. 30 tablet 2 01/06/2018 at AM  . Multiple Vitamin (MULTI-VITAMIN DAILY PO) Take 1 tablet by mouth daily.    01/06/2018 at AM  . [name] (OYSTER CALCIUM) 500 MG TABS Take 500 mg of elemental calcium by mouth 2 (two) times daily.   01/06/2018 at PM  . polyethylene glycol powder (GLYCOLAX/MIRALAX) powder Take 17-34 g by mouth daily as needed. 250 g 2 PRN at PRN  . potassium chloride (K-DUR) 10 MEQ tablet Take  1 tablet (10 mEq total) by mouth 4 (four) times daily. 360 tablet 1 01/06/2018 at PM  . traMADol (ULTRAM) 50 MG tablet Take 1 tablet by mouth every 6 (six) hours as needed for moderate pain.    PRN at PRN  . torsemide (DEMADEX) 20 MG tablet Take 2 tablets (40 mg total) 2 (two) times daily by mouth. 120 tablet 3 Taking   Scheduled:  . aspirin  325 mg Oral Daily  . calcium carbonate  2.5 tablet Oral BID  . furosemide  80 mg Intravenous BID  . gabapentin  100 mg Oral TID  . heparin  1,300 Units Intravenous Once  . [START ON 01/08/2018] levothyroxine  25 mcg Oral QAC breakfast  . metoprolol tartrate  12.5 mg Oral BID  . mirabegron ER  25 mg Oral Daily  . multivitamin with minerals   Oral Daily  . nitroGLYCERIN  0.5 inch Topical Q6H  . potassium chloride  10 mEq Oral QID  . rosuvastatin  10 mg Oral Daily  . sodium chloride flush  3 mL  Intravenous Q12H    Assessment: Pharmacy consulted to dose and monitor Heparin in this 84 year old woman being treated for ACS/STEMI Goal of Therapy:  Heparin level 0.3-0.7 units/ml Monitor platelets by anticoagulation protocol: Yes   Plan:  Will give Heparin bolus 2600 units x 1 and will start Heparin gtt rate @ 500 units/hr. Will check Heparin level 8 hours after starting heparin gtt.   01/29 @ 2100 HL < 0.10 subtherapeutic. Will rebolus w/ heparin 1300 units IV x 1 and will increase rate to 650 units/hr and will recheck HL @ 0700  Tobie Lords, PharmD, BCPS Clinical Pharmacist 01/07/2018

## 2018-01-07 NOTE — ED Notes (Signed)
Pt on NRB, 93% on NRB.

## 2018-01-07 NOTE — Consult Note (Signed)
Cardiology Consultation:   Patient ID: Jennifer Skinner; [id number]; 16-Feb-1931   Admit date: 01/07/2018 Date of Consult: 01/07/2018  Primary Care Provider: Olin Hauser, DO Primary Cardiologist: Esmond Plants, MD PhD Primary Electrophysiologist:  None   Patient Profile:   Jennifer Skinner is a 84 y.o. female with a hx of chronic diastolic heart failure complicated by mitral valve prolapse/regurgitation, COPD, and GERD who is being seen today for the evaluation of shortness of breath and elevated troponin at the request of Dr. Benjie Karvonen.  History of Present Illness:   Jennifer Skinner notes that over the last 2-3 weeks, she has noticed increased fatigue with this feeling that her legs were giving out.  She has experienced accompanying bilateral leg swelling and weight gain of up to 10 pounds.  However, she notes that her weight frequently fluctuates.  She denies chest pain, shortness of breath, palpitations, and lightheadedness.  Over the last 2 days, she has also experienced some subjective chills as well as an intermittently productive cough.  She denies orthopnea and PND.  Because of her worsening fatigue, Jennifer Skinner was evaluated acutely in the heart failure clinic by Darylene Price, NP.  She was subsequently referred to the emergency department due to hypoxia with an oxygen saturation on room air of 66%.  At this time, Jennifer Skinner reports feeling close to her baseline.  Her husband notes that Jennifer Skinner continues to be more somnolent than usual.  Over the last few days she has even fallen asleep while seated at the dinner table, which is out of character for her.  In the emergency department, Jennifer Skinner was found to have elevated BNP and troponin (see below).  Oxygen saturation improved with supplemental O2 via nasal cannula.  Jennifer Skinner reports being compliant with her medications.  She is not add salt to her food, though she does not always follow a low-sodium diet.  In fact, she reports having been told  in the past by her providers to eat potato chips to help combat hyponatremia.  Past Medical History:  Diagnosis Date  . 1st degree AV block   . Arthritis   . Chronic diastolic CHF (congestive heart failure) ([place])    a. echo 2014: EF 55-60%, nl wall motion, GR1DD, mild MR, moder mitral prolapse, PASP 40 mm Hg; b. echo 09/2015: EF 60-65%, nl wall motion, GR1DD, mod MR, mild to mod TR, PASP 56 mm Hg  . COPD (chronic obstructive pulmonary disease) ([place])   . Degenerative disc disease, lumbar   . Gastro-esophageal reflux   . Mitral prolapse   . Mitral regurgitation   . Osteoporosis     Past Surgical History:  Procedure Laterality Date  . ABDOMINAL HYSTERECTOMY    . APPENDECTOMY    . DILATION AND CURETTAGE OF UTERUS    . HYSTEROTOMY    . right breast biopsy    . right eye      surgery  . TONSILLECTOMY       Home Medications:  Prior to Admission medications   Medication Sig Start Date Charlestine Rookstool Date Taking? Authorizing Provider  acetaminophen (TYLENOL) 500 MG tablet Take 500 mg by mouth every 6 (six) hours as needed.   Yes [provider]  albuterol (PROVENTIL) (2.5 MG/3ML) 0.083% nebulizer solution Take 3 mLs (2.5 mg total) by nebulization every 6 (six) hours as needed for wheezing or shortness of breath. 10/12/15  Yes Epifanio Lesches, MD  Apple Cider Vinegar (APPLE CIDER VINEGAR ULTRA) 600 MG CAPS 2 teaspoons  once a day   Yes [provider]  aspirin 325 MG tablet Take 325 mg by mouth daily.   Yes [provider]  baclofen (LIORESAL) 10 MG tablet Take 0.5-1 tablets (5-10 mg total) by mouth 3 (three) times daily as needed for muscle spasms. 08/19/17  Yes Karamalegos, Devonne Doughty, DO  Calcium Gluconate 500 MG CAPS Take 2 capsules by mouth daily.    Yes [provider]  gabapentin (NEURONTIN) 100 MG capsule Take 1 capsule (100 mg total) by mouth 3 (three) times daily. 06/04/17  Yes Karamalegos, Devonne Doughty, DO  levothyroxine (SYNTHROID, LEVOTHROID) 25 MCG  tablet Take 1 tablet (25 mcg total) by mouth daily before breakfast. Ran out of meds. 02/20/17  Yes Arlis Porta., MD  metoprolol tartrate (LOPRESSOR) 25 MG tablet Take 0.5 tablets (12.5 mg total) by mouth 2 (two) times daily. 03/12/17  Yes Karamalegos, Devonne Doughty, DO  mirabegron ER (MYRBETRIQ) 25 MG TB24 tablet Take 1 tablet (25 mg total) by mouth daily. 09/05/17  Yes Karamalegos, Devonne Doughty, DO  Multiple Vitamin (MULTI-VITAMIN DAILY PO) Take 1 tablet by mouth daily.    Yes [provider]  [name] (OYSTER CALCIUM) 500 MG TABS Take 500 mg of elemental calcium by mouth 2 (two) times daily.   Yes [provider]  polyethylene glycol powder (GLYCOLAX/MIRALAX) powder Take 17-34 g by mouth daily as needed. 08/19/17  Yes Karamalegos, Devonne Doughty, DO  potassium chloride (K-DUR) 10 MEQ tablet Take 1 tablet (10 mEq total) by mouth 4 (four) times daily. 12/17/17  Yes Gollan, Kathlene November, MD  traMADol (ULTRAM) 50 MG tablet Take 1 tablet by mouth every 6 (six) hours as needed for moderate pain.  08/23/14  Yes [provider]  torsemide (DEMADEX) 20 MG tablet Take 2 tablets (40 mg total) 2 (two) times daily by mouth. 10/28/17 11/27/17  Alisa Graff, FNP    Inpatient Medications: Scheduled Meds:  Continuous Infusions: . heparin 500 Units/hr (01/07/18 1242)   PRN Meds: hydrALAZINE  Allergies:    Allergies  Allergen Reactions  . Penicillins     Rash     Social History:   Social History   Socioeconomic History  . Marital status: Married    Spouse name: Not on file  . Number of children: Not on file  . Years of education: Not on file  . Highest education level: Not on file  Social Needs  . Financial resource strain: Not hard at all  . Food insecurity - worry: Never true  . Food insecurity - inability: Never true  . Transportation needs - medical: No  . Transportation needs - non-medical: No  Occupational History  . Not on file  Tobacco Use  . Smoking  status: Never Smoker  . Smokeless tobacco: Never Used  Substance and Sexual Activity  . Alcohol use: No    Alcohol/week: 0.0 oz  . Drug use: No  . Sexual activity: No  Other Topics Concern  . Not on file  Social History Narrative  . Not on file    Family History:   Family History  Problem Relation Age of Onset  . CVA Mother 31  . Heart attack Father 22  . Throat cancer Sister   . CAD Brother      ROS:  Review of Systems  Constitutional: Positive for chills and malaise/fatigue. Negative for fever and weight loss.  HENT: Negative.   Eyes: Negative.   Respiratory: Positive for sputum production. Negative for shortness  of breath.   Cardiovascular: Positive for leg swelling. Negative for chest pain, palpitations, orthopnea and PND.  Gastrointestinal: Negative.   Genitourinary: Negative.   Musculoskeletal: Positive for back pain.  Skin: Negative.   Neurological: Positive for weakness.  Endo/Heme/Allergies: Negative.   Psychiatric/Behavioral: Negative.     Physical Exam/Data:   Vitals:   01/07/18 1100 01/07/18 1200 01/07/18 1212 01/07/18 1230  BP: 105/77 116/74  127/76  Pulse: 73 79 79 80  Resp: 17 14 18  (!) 21  Temp:      TempSrc:      SpO2: 100% 100% 94% 93%   No intake or output data in the 24 hours ending 01/07/18 1301 There were no vitals filed for this visit. There is no height or weight on file to calculate BMI.  General: Thin elderly woman seated comfortably in bed.  She is accompanied by her husband. HEENT: normal Lymph: no adenopathy Neck: JVP to the angle of the mandible seated at 45 degrees.  Positive HJR. Endocrine:  No thryomegaly Vascular: No carotid bruits; FA pulses 2+ bilaterally without bruits  Cardiac: Regular rate and rhythm with 3/6 holosystolic murmur loudest at the apex.  No rubs or gallops. Lungs:  clear to auscultation bilaterally, no wheezing, rhonchi or rales  Abd: soft, nontender, no hepatomegaly  Ext: 2-3+ pitting edema to the  proximal calves bilaterally. Musculoskeletal:  No deformities, BUE and BLE strength normal and equal Skin: warm and dry  Neuro:  CNs 2-12 intact, no focal abnormalities noted Psych:  Normal affect   EKG:  The EKG was personally reviewed and demonstrates:  NSR, right axis deviation, and inferolateral T-wave inversions. No significant change since 10/15/17.  Relevant CV Studies: Pharmacologic myocardial perfusion stress test (10/23/17): - No significant ischemia. - LVEF 88% with normal wall motion. - Low risk scan.  Echo (10/11/15): - Left ventricle: The cavity size was normal. Systolic function was   normal. The estimated ejection fraction was in the range of 60%   to 65%. Wall motion was normal; there were no regional wall   motion abnormalities. Doppler parameters are consistent with   abnormal left ventricular relaxation (grade 1 diastolic   dysfunction). - Mitral valve: Prolapse. There was moderate regurgitation. - Left atrium: The atrium was moderately dilated. - Right ventricle: Systolic function was normal. - Right atrium: The atrium was mildly dilated. - Tricuspid valve: There was mild-moderate regurgitation. - Pulmonary arteries: The main pulmonary artery was mildly dilated.   Systolic pressure was moderately elevated. PA peak pressure: 56   mm Hg (S).  Laboratory Data:  Chemistry Recent Labs  Lab 01/07/18 1042  NA 133*  K 5.2*  CL 97*  CO2 27  GLUCOSE 104*  BUN 52*  CREATININE 0.97  CALCIUM 8.6*  GFRNONAA 52*  GFRAA >60  ANIONGAP 9    Recent Labs  Lab 01/07/18 1042  PROT 7.1  ALBUMIN 3.3*  AST 112*  ALT 102*  ALKPHOS 89  BILITOT 0.6   Hematology Recent Labs  Lab 01/07/18 1042  WBC 6.8  RBC 4.55  HGB 14.2  HCT 44.5  MCV 97.9  MCH 31.2  MCHC 31.9*  RDW 14.8*  PLT 234   Cardiac Enzymes Recent Labs  Lab 01/07/18 1042  TROPONINI 0.99*   No results for input(s): TROPIPOC in the last 168 hours.  BNP Recent Labs  Lab 01/07/18 1042  BNP  3,070.0*    DDimer No results for input(s): DDIMER in the last 168 hours.  Radiology/Studies:  Dg Chest  1 View  Result Date: 01/07/2018 CLINICAL DATA:  Generalize weakness EXAM: CHEST 1 VIEW COMPARISON:  11/08/2015 FINDINGS: Small right pleural effusion. Diffuse bilateral interstitial thickening. No pneumothorax. Stable cardiomegaly. No acute osseous abnormality. IMPRESSION: Mild CHF. Electronically Signed   By: Kathreen Devoid   On: 01/07/2018 11:10    Assessment and Plan:   Acute respiratory failure with hypoxia and diastolic heart failure Jennifer Skinner reports progressive fatigue over the last few weeks and was found to be markedly hypoxic in the heart failure clinic today.  She has evidence of volume overload with peripheral edema and JVD.  However, she denies orthopnea and PND.  I am most concerned that her presentation reflects severe pulmonary hypertension and right heart failure.  Degree of left heart failure is also possible, though lack of orthopnea and PND argue against this.  Obtain transthoracic echocardiogram.  Initiate diuresis.  Given that the patient is on torsemide 40 mg p.o. twice daily at home, I think she will need at least furosemide 80 mg twice daily to mobilize fluid.  In the setting of edema and right-sided heart failure, pulmonary embolism is a possibility, though risk factors are limited.  Consider checking d-dimer.  I would avoid giving IV contrast at this time in the setting of acute kidney injury.  Elevated troponin Ms. Soyars has not had any chest pain or shortness of breath.  Her EKG shows inferolateral T wave inversions that are similar to prior tracings.  I suspect this reflects supply-demand mismatch in the setting of decompensated heart failure.  We have agreed to defer invasive procedures at this time.  Trend troponin until it has peaked, then stop.  Obtain transthoracic echocardiogram.  It is reasonable to continue with heparin infusion for the time  being.  Continue aspirin; would advocate for 81 mg daily.  Acute kidney injury Baseline creatinine is approximately 0.4; creatinine today is 1.  Patient does not appear dehydrated.  I am concerned that her elevated creatinine is due to venous congestion from right-sided heart failure.  Diuresis as above.  If renal function worsens in the setting of diuresis, right heart catheterization may be necessary to better establish volume status and cardiac output.  Check lactate as marker of global perfusion.  Transaminitis Mild, potentially due to hepatic venous congestion and/or low cardiac output.  Continue to monitor with diuresis.  Check lactate, as above.  For questions or updates, please contact [place] Please consult [email or web address] for contact info under [facility] cardiology.   Signed, Nelva Bush, MD  01/07/2018 1:01 PM

## 2018-01-07 NOTE — ED Triage Notes (Signed)
ARrives with c/o legs feeling weak for a couple of weaks.  On arrival patients oxygen sat on RA was 68.  No SOB/ DOE.  Placed on 2l/ [place]

## 2018-01-07 NOTE — ED Notes (Addendum)
Pt talking without difficulty in full and complete sentences. On 4L [place], 81%.

## 2018-01-07 NOTE — ED Notes (Signed)
ED Provider at bedside. 

## 2018-01-07 NOTE — ED Notes (Signed)
Non rebreather taken off at this time , 6L [place], pt tolerating well. MD at bedside

## 2018-01-08 ENCOUNTER — Inpatient Hospital Stay: Payer: Medicare Other

## 2018-01-08 DIAGNOSIS — I080 Rheumatic disorders of both mitral and aortic valves: Secondary | ICD-10-CM

## 2018-01-08 DIAGNOSIS — R0902 Hypoxemia: Secondary | ICD-10-CM

## 2018-01-08 DIAGNOSIS — I272 Pulmonary hypertension, unspecified: Secondary | ICD-10-CM

## 2018-01-08 DIAGNOSIS — I5031 Acute diastolic (congestive) heart failure: Secondary | ICD-10-CM

## 2018-01-08 DIAGNOSIS — R531 Weakness: Secondary | ICD-10-CM

## 2018-01-08 DIAGNOSIS — J410 Simple chronic bronchitis: Secondary | ICD-10-CM

## 2018-01-08 DIAGNOSIS — J849 Interstitial pulmonary disease, unspecified: Secondary | ICD-10-CM

## 2018-01-08 LAB — CBC
HCT: 42.3 % (ref 35.0–47.0)
HEMOGLOBIN: 13.4 g/dL (ref 12.0–16.0)
MCH: 31 pg (ref 26.0–34.0)
MCHC: 31.7 g/dL — AB (ref 32.0–36.0)
MCV: 97.7 fL (ref 80.0–100.0)
Platelets: 232 10*3/uL (ref 150–440)
RBC: 4.32 MIL/uL (ref 3.80–5.20)
RDW: 14.9 % — AB (ref 11.5–14.5)
WBC: 6 10*3/uL (ref 3.6–11.0)

## 2018-01-08 LAB — COMPREHENSIVE METABOLIC PANEL
ALBUMIN: 2.8 g/dL — AB (ref 3.5–5.0)
ALK PHOS: 77 U/L (ref 38–126)
ALT: 88 U/L — ABNORMAL HIGH (ref 14–54)
ANION GAP: 3 — AB (ref 5–15)
AST: 93 U/L — ABNORMAL HIGH (ref 15–41)
BUN: 46 mg/dL — ABNORMAL HIGH (ref 6–20)
CO2: 31 mmol/L (ref 22–32)
Calcium: 7.9 mg/dL — ABNORMAL LOW (ref 8.9–10.3)
Chloride: 102 mmol/L (ref 101–111)
Creatinine, Ser: 0.82 mg/dL (ref 0.44–1.00)
GFR calc Af Amer: >60 mL/min (ref >60)
GFR calc non Af Amer: >60 mL/min (ref >60)
GLUCOSE: 102 mg/dL — AB (ref 65–99)
POTASSIUM: 3.6 mmol/L (ref 3.5–5.1)
SODIUM: 136 mmol/L (ref 135–145)
Total Bilirubin: 0.7 mg/dL (ref 0.3–1.2)
Total Protein: 6.3 g/dL — ABNORMAL LOW (ref 6.5–8.1)

## 2018-01-08 LAB — HEMOGLOBIN A1C
Hgb A1c MFr Bld: 6.3 % — ABNORMAL HIGH (ref 4.8–5.6)
Mean Plasma Glucose: 134.11 mg/dL

## 2018-01-08 LAB — TROPONIN I
TROPONIN I: 1.84 ng/mL — AB (ref <0.03)
Troponin I: 2.06 ng/mL (ref <0.03)

## 2018-01-08 LAB — ECHOCARDIOGRAM COMPLETE
HEIGHTINCHES: 57 in
Weight: 1515.2 oz

## 2018-01-08 LAB — HEPARIN LEVEL (UNFRACTIONATED)
Heparin Unfractionated: 0.27 IU/mL — ABNORMAL LOW (ref 0.30–0.70)
Heparin Unfractionated: 0.34 IU/mL (ref 0.30–0.70)

## 2018-01-08 MED ORDER — IOPAMIDOL (ISOVUE-370) INJECTION 76%
75.0000 mL | Freq: Once | INTRAVENOUS | Status: AC | PRN
  Administered 2018-01-08: 75 mL via INTRAVENOUS

## 2018-01-08 MED ORDER — SILDENAFIL CITRATE 20 MG PO TABS
20.0000 mg | ORAL_TABLET | Freq: Three times a day (TID) | ORAL | Status: DC
  Administered 2018-01-08: 20 mg via ORAL
  Filled 2018-01-08 (×2): qty 1

## 2018-01-08 MED ORDER — HEPARIN BOLUS VIA INFUSION
650.0000 [IU] | Freq: Once | INTRAVENOUS | Status: AC
  Administered 2018-01-08: 650 [IU] via INTRAVENOUS
  Filled 2018-01-08: qty 650

## 2018-01-08 MED ORDER — HEPARIN (PORCINE) IN NACL 100-0.45 UNIT/ML-% IJ SOLN
750.0000 [IU]/h | INTRAMUSCULAR | Status: DC
  Administered 2018-01-08 (×2): 750 [IU]/h via INTRAVENOUS
  Filled 2018-01-08: qty 250

## 2018-01-08 MED ORDER — SILDENAFIL CITRATE 20 MG PO TABS
20.0000 mg | ORAL_TABLET | Freq: Three times a day (TID) | ORAL | Status: DC
  Filled 2018-01-08 (×2): qty 1

## 2018-01-08 NOTE — Progress Notes (Signed)
ANTICOAGULATION CONSULT NOTE - Initial Consult  Pharmacy Consult for Heparin  Indication: chest pain/ACS  Allergies  Allergen Reactions  . Penicillins     Rash     Patient Measurements: Height: 4\' 9"  (144.8 cm) Weight: 90 lb 6.4 oz (41 kg) IBW/kg (Calculated) : 38.6 Heparin Dosing Weight: 41 kg   Vital Signs: Temp: 98.2 F (36.8 C) (01/30 0807) Temp Source: Oral (01/30 0807) BP: 101/51 (01/30 0807) Pulse Rate: 77 (01/30 0807)  Labs: Recent Labs    01/07/18 1042 01/07/18 2105 01/08/18 0311 01/08/18 0654  HGB 14.2  --  13.4  --   HCT 44.5  --  42.3  --   PLT 234  --  232  --   APTT 29  --   --   --   LABPROT 14.4  --   --   --   INR 1.13  --   --   --   HEPARINUNFRC  --  <0.10*  --  0.27*  CREATININE 0.97  --  0.82  --   TROPONINI 0.99* 1.63* 2.06* 1.84*    Estimated Creatinine Clearance: 31.1 mL/min (by C-G formula based on SCr of 0.82 mg/dL).   Medical History: Past Medical History:  Diagnosis Date  . 1st degree AV block   . Arthritis   . Chronic diastolic CHF (congestive heart failure) ([place])    a. echo 2014: EF 55-60%, nl wall motion, GR1DD, mild MR, moder mitral prolapse, PASP 40 mm Hg; b. echo 09/2015: EF 60-65%, nl wall motion, GR1DD, mod MR, mild to mod TR, PASP 56 mm Hg  . COPD (chronic obstructive pulmonary disease) ([place])   . Degenerative disc disease, lumbar   . Gastro-esophageal reflux   . Mitral prolapse   . Mitral regurgitation   . Osteoporosis     Assessment: Pharmacy consulted to dose and monitor Heparin in this 84 year old woman being treated for ACS/STEMI. Patient was not on anticoagulants PTA per med rec.  Goal of Therapy:  Heparin level 0.3-0.7 units/ml Monitor platelets by anticoagulation protocol: Yes   Plan:  HL = 0.27 remains subtherapeutic on infusion of 650 units/hr. Will give 650 unit bolus and increase rate to 750 units/hr. Will check HL in 8 hours.  CBC ordered with AM labs tomorrow.  Lenis Noon, PharmD,  BCPS Clinical Pharmacist 01/08/2018

## 2018-01-08 NOTE — Progress Notes (Signed)
[facility]                                                                                                                                                                                  Patient Demographics   Jennifer Skinner, is a 84 y.o. female, DOB - 04-22-1931, WUJ:[id number]  Admit date - 01/07/2018   Admitting Physician Bettey Costa, MD  Outpatient Primary MD for the patient is Olin Hauser, DO   LOS - 1  Subjective: Patient admitted with shortness of breath and acute respiratory failure States that she was never short of breath this week    Review of Systems:   CONSTITUTIONAL: No documented fever.  Positive fatigue, positive weakness. No weight gain, no weight loss.  EYES: No blurry or double vision.  ENT: No tinnitus. No postnasal drip. No redness of the oropharynx.  RESPIRATORY: No cough, no wheeze, no hemoptysis.   dyspnea.  CARDIOVASCULAR: No chest pain. No orthopnea. No palpitations. No syncope.  GASTROINTESTINAL: No nausea, no vomiting or diarrhea. No abdominal pain. No melena or hematochezia.  GENITOURINARY: No dysuria or hematuria.  ENDOCRINE: No polyuria or nocturia. No heat or cold intolerance.  HEMATOLOGY: No anemia. No bruising. No bleeding.  INTEGUMENTARY: No rashes. No lesions.  MUSCULOSKELETAL: No arthritis. No swelling. No gout.  NEUROLOGIC: No numbness, tingling, or ataxia. No seizure-type activity.  PSYCHIATRIC: No anxiety. No insomnia. No ADD.    Vitals:   Vitals:   01/07/18 1928 01/07/18 2324 01/08/18 0343 01/08/18 0807  BP: (!) 100/48 (!) 106/52 (!) 96/40 (!) 101/51  Pulse: 97 84 72 77  Resp: 18  18 18   Temp: 97.9 F (36.6 C)  98.5 F (36.9 C) 98.2 F (36.8 C)  TempSrc:   Oral Oral  SpO2: 97%  96% 97%  Weight:   90 lb 6.4 oz (41 kg)   Height:        Wt Readings from Last 3 Encounters:  01/08/18 90 lb 6.4 oz (41 kg)  01/07/18 96 lb 8 oz (43.8 kg)  11/27/17 91 lb 4 oz (41.4 kg)      Intake/Output Summary (Last 24 hours) at 01/08/2018 1358 Last data filed at 01/08/2018 1300 Gross per 24 hour  Intake 467.08 ml  Output 1500 ml  Net -1032.92 ml    Physical Exam:   GENERAL: Pleasant-appearing in no apparent distress.  Chronically ill-appearing HEAD, EYES, EARS, NOSE AND THROAT: Atraumatic, normocephalic. Extraocular muscles are intact. Pupils equal and reactive to light. Sclerae anicteric. No conjunctival injection. No oro-pharyngeal erythema.  NECK: Supple. There is no jugular venous distention. No bruits, no lymphadenopathy, no thyromegaly.  HEART: Regular rate and  rhythm,.  Systolic murmur at the, no rubs, no clicks.  LUNGS: Crackles at the bases ABDOMEN: Soft, flat, nontender, nondistended. Has good bowel sounds. No hepatosplenomegaly appreciated.  EXTREMITIES: Positive edema NEUROLOGIC: The patient is alert, awake, and oriented x3 with no focal motor or sensory deficits appreciated bilaterally.  SKIN: Moist and warm with no rashes appreciated.  Psych: Not anxious, depressed LN: No inguinal LN enlargement    Antibiotics   Anti-infectives (From admission, onward)   None      Medications   Scheduled Meds: . aspirin  325 mg Oral Daily  . calcium carbonate  2.5 tablet Oral BID  . furosemide  80 mg Intravenous BID  . gabapentin  100 mg Oral TID  . levothyroxine  25 mcg Oral QAC breakfast  . metoprolol tartrate  12.5 mg Oral BID  . mirabegron ER  25 mg Oral Daily  . multivitamin with minerals   Oral Daily  . nitroGLYCERIN  0.5 inch Topical Q6H  . potassium chloride  10 mEq Oral QID  . rosuvastatin  10 mg Oral Daily  . sodium chloride flush  3 mL Intravenous Q12H   Continuous Infusions: . sodium chloride    . heparin 750 Units/hr (01/08/18 0900)   PRN Meds:.sodium chloride, acetaminophen **OR** acetaminophen, albuterol, bisacodyl, hydrALAZINE, ondansetron **OR** ondansetron (ZOFRAN) IV, senna-docusate, sodium chloride flush, traMADol   Data  Review:   Micro Results No results found for this or any previous visit (from the past 240 hour(s)).  Radiology Reports Dg Chest 1 View  Result Date: 01/07/2018 CLINICAL DATA:  Generalize weakness EXAM: CHEST 1 VIEW COMPARISON:  11/08/2015 FINDINGS: Small right pleural effusion. Diffuse bilateral interstitial thickening. No pneumothorax. Stable cardiomegaly. No acute osseous abnormality. IMPRESSION: Mild CHF. Electronically Signed   By: Kathreen Devoid   On: 01/07/2018 11:10   Ct Angio Chest Pe W Or Wo Contrast  Result Date: 01/08/2018 CLINICAL DATA:  Increasing lower extremity edema, shortness of breath and dyspnea on exertion. History of chronic diastolic heart failure. Pulmonary edema on radiographs. EXAM: CT ANGIOGRAPHY CHEST WITH CONTRAST TECHNIQUE: Multidetector CT imaging of the chest was performed using the standard protocol during bolus administration of intravenous contrast. Multiplanar CT image reconstructions and MIPs were obtained to evaluate the vascular anatomy. CONTRAST:  21mL ISOVUE-370 IOPAMIDOL (ISOVUE-370) INJECTION 76% COMPARISON:  Chest radiograph 01/07/2018. Thoracic MRI 12/28/2013. Report only from chest CT 08/31/2003. FINDINGS: Cardiovascular: The pulmonary arteries are well opacified with contrast to the level of the subsegmental branches. There is no evidence of acute pulmonary embolism. There is central enlargement of the pulmonary arteries consistent with pulmonary arterial hypertension. There is limited enhancement of the systemic arteries. Mild aortic and coronary artery atherosclerosis. The heart is significantly enlarged. There are aortic valvular calcifications. No pericardial effusion. Mediastinum/Nodes: Mildly enlarged mediastinal and hilar lymph nodes, including a 1.5 cm subcarinal node on image 39 of series 5 and a 1.4 cm right hilar node on image 45. The thyroid gland, trachea and esophagus demonstrate no significant findings. Lungs/Pleura: Small dependent  right-greater-than-left pleural effusions. No pneumothorax. Mild emphysema. There is right lower lobe airspace disease with volume loss, probably atelectasis. Patchy airspace disease is also present anteriorly in the right upper lobe, in the right middle and left lower lobes. No discrete pulmonary nodules. Upper abdomen: Prominent reflux of contrast into the IVC and hepatic veins. Musculoskeletal/Chest wall: Paucity of subcutaneous and mediastinal fat. There is mild edema throughout the soft tissues. Superior endplate compression fractures at T6 and T11 are new from  the 2015 thoracic MRI. T12 and L1 compression fractures are stable. Review of the MIP images confirms the above findings. IMPRESSION: 1. No evidence of acute pulmonary embolism. 2. Marked cardiomegaly with central enlargement of the pulmonary arteries consistent with pulmonary arterial hypertension. 3. Anasarca with generalized soft tissue edema and small bilateral pleural effusions. 4. Right-greater-than-left basilar pulmonary opacities, probably atelectasis. Radiographic follow up recommended. 5. Age-indeterminate T6 and T11 thoracic compression fractures, new from MRI of 2015. Electronically Signed   By: Richardean Sale M.D.   On: 01/08/2018 12:11     CBC Recent Labs  Lab 01/07/18 1042 01/08/18 0311  WBC 6.8 6.0  HGB 14.2 13.4  HCT 44.5 42.3  PLT 234 232  MCV 97.9 97.7  MCH 31.2 31.0  MCHC 31.9* 31.7*  RDW 14.8* 14.9*  LYMPHSABS 0.8*  --   MONOABS 0.8  --   EOSABS 0.0  --   BASOSABS 0.0  --     Chemistries  Recent Labs  Lab 01/07/18 1042 01/08/18 0311  NA 133* 136  K 5.2* 3.6  CL 97* 102  CO2 27 31  GLUCOSE 104* 102*  BUN 52* 46*  CREATININE 0.97 0.82  CALCIUM 8.6* 7.9*  AST 112* 93*  ALT 102* 88*  ALKPHOS 89 77  BILITOT 0.6 0.7   ------------------------------------------------------------------------------------------------------------------ estimated creatinine clearance is 31.1 mL/min (by C-G formula based  on SCr of 0.82 mg/dL). ------------------------------------------------------------------------------------------------------------------ Recent Labs    01/07/18 2105  HGBA1C 6.3*   ------------------------------------------------------------------------------------------------------------------ Recent Labs    01/07/18 2105  CHOL 125  HDL 27*  LDLCALC 76  TRIG 109  CHOLHDL 4.6   ------------------------------------------------------------------------------------------------------------------ No results for input(s): TSH, T4TOTAL, T3FREE, THYROIDAB in the last 72 hours.  Invalid input(s): FREET3 ------------------------------------------------------------------------------------------------------------------ No results for input(s): VITAMINB12, FOLATE, FERRITIN, TIBC, IRON, RETICCTPCT in the last 72 hours.  Coagulation profile Recent Labs  Lab 01/07/18 1042  INR 1.13    No results for input(s): DDIMER in the last 72 hours.  Cardiac Enzymes Recent Labs  Lab 01/07/18 2105 01/08/18 0311 01/08/18 0654  TROPONINI 1.63* 2.06* 1.84*   ------------------------------------------------------------------------------------------------------------------ Invalid input(s): POCBNP    Assessment & Plan   84 year old female with history of chronic diastolic heart failure and COPD not requiring oxygen who presents with shortness of breath, lower extremity edema, PND and orthopnea and also found to have new oxygen requirement.   1. Acute hypoxic respiratory failure in the setting of acute on chronic diastolic heart failure Wean oxygen to baseline room air  2. Acute on chronic diastolic heart failure:  Appreciate cardiology input IV Lasix dose has been increased   3. Elevated troponin: Non-STEMI versus demand ischemia Per cardiology they feel that this could be related pulmonary embolism or demand ischemia patient underwent a CT per PE showed no evidence of PE Continue  heparin for now  4.  Severe pulmonary hypertension: Suspect due to underlying COPD Pulmonary consult  5. Hypothyroid: Continue Synthroid  6. elevated LFTS may be from hepatic congestion       Code Status Orders  (From admission, onward)        Start     Ordered   01/07/18 1634  Full code  Continuous     01/07/18 1633    Code Status History    Date Active Date Inactive Code Status Order ID Comments User Context   10/11/2015 04:32 10/13/2015 14:33 Full Code [id number]  Demetrios Loll, MD Inpatient           Consults  cardiology  DVT Prophylaxis  heparin  Lab Results  Component Value Date   PLT 232 01/08/2018     Time Spent in minutes   56min  Greater than 50% of time spent in care coordination and counseling patient regarding the condition and plan of care.   Dustin Flock M.D on 01/08/2018 at 1:58 PM  Between 7am to 6pm - Pager - [phone]  After 6pm go to [email or web address] - password [email or web address] [facility]   Office  [phone]

## 2018-01-08 NOTE — Care Management (Signed)
Admitted from home with CHF.  Supplemental 02 is acute.  Is followed by  [facility] on a regular basis.  Weighs daily but may not always intervene.  My benefit from home health nurse follow up

## 2018-01-08 NOTE — Progress Notes (Addendum)
Date: 01/08/2018,   MRN# [id number] Jennifer Skinner March 14, 1931 Code Status:     Code Status Orders  (From admission, onward)        Start     Ordered   01/07/18 1634  Full code  Continuous     01/07/18 1633    Code Status History    Date Active Date Inactive Code Status Order ID Comments User Context   10/11/2015 04:32 10/13/2015 14:33 Full Code [id number]  Demetrios Loll, MD Inpatient     Hosp day:@LENGTHOFSTAYDAYS @ Referring MD: @ATDPROV @      CC: Pulmonary hypertension.   HPI: This is an elderly lady. Multiple chronic problems including copd. Here with diastolic failure. Cardiology has seen, Pulmonary consulted regarding pulmonary hypertension. On chest ct pulmonary arteries appear large, no pulmonary embolism. Her echo yesterday shown below.  LV EF: 55% -   60%  ------------------------------------------------------------------- Indications:      N46.27 Acute Diastolic CHF.  ------------------------------------------------------------------- History:   PMH:  Acquired from the patient and from the patient&'s chart.  PMH:  COPD. MVP.  ------------------------------------------------------------------- Study Conclusions  - Left ventricle: The cavity size was normal. There was mild   concentric hypertrophy. Systolic function was normal. The   estimated ejection fraction was in the range of 55% to 60%. Wall   motion was normal; there were no regional wall motion   abnormalities. Doppler parameters are consistent with abnormal   left ventricular relaxation (grade 1 diastolic dysfunction). - Ventricular septum: The contour showed diastolic flattening. - Mitral valve: There was moderate to severe regurgitation directed   eccentrically. - Left atrium: The atrium was moderately dilated. - Right ventricle: The cavity size was moderately dilated. Wall   thickness was normal. Systolic function was moderately to   severely reduced. - Right atrium: The atrium was moderately  dilated. - Tricuspid valve: There was moderate regurgitation. - Pulmonary arteries: Systolic pressure was severely elevated. PA   peak pressure: 70 mm Hg (S).  NM SCAN Pharmacological myocardial perfusion imaging study with no significant  ischemia Normal wall motion, EF estimated at 88% No EKG changes concerning for ischemia at peak stress or in recovery. Resting EKG with T wave abnormality inferior leads, V3/V4/V5, PVCs noted Low risk scan   PMHX:   Past Medical History:  Diagnosis Date  . 1st degree AV block   . Arthritis   . Chronic diastolic CHF (congestive heart failure) ([place])    a. echo 2014: EF 55-60%, nl wall motion, GR1DD, mild MR, moder mitral prolapse, PASP 40 mm Hg; b. echo 09/2015: EF 60-65%, nl wall motion, GR1DD, mod MR, mild to mod TR, PASP 56 mm Hg  . COPD (chronic obstructive pulmonary disease) ([place])   . Degenerative disc disease, lumbar   . Gastro-esophageal reflux   . Mitral prolapse   . Mitral regurgitation   . Osteoporosis    Surgical Hx:  Past Surgical History:  Procedure Laterality Date  . ABDOMINAL HYSTERECTOMY    . APPENDECTOMY    . DILATION AND CURETTAGE OF UTERUS    . HYSTEROTOMY    . right breast biopsy    . right eye      surgery  . TONSILLECTOMY     Family Hx:  Family History  Problem Relation Age of Onset  . CVA Mother 45  . Heart attack Father 78  . Throat cancer Sister   . CAD Brother    Social Hx:   Social History   Tobacco Use  . Smoking status: Never  Smoker  . Smokeless tobacco: Never Used  Substance Use Topics  . Alcohol use: No    Alcohol/week: 0.0 oz  . Drug use: No   Medication:    Home Medication:    Current Medication: @CURMEDTAB @   Allergies:  Penicillins  Review of Systems: Gen:  Denies  fever, sweats, chills + weakness HEENT: Denies blurred vision, double vision, ear pain, eye pain, hearing loss, nose bleeds, sore throat Cvc:  No dizziness, chest pain or heaviness Resp:  Chronic dyspnes  Gi:  Denies swallowing difficulty, stomach pain, nausea or vomiting, diarrhea, constipation, bowel incontinence Gu:  Denies bladder incontinence, burning urine Ext:   No Joint pain, stiffness or swelling Skin: No skin rash, easy bruising or bleeding or hives Endoc:  No polyuria, polydipsia , polyphagia or weight change Psych: No depression, insomnia or hallucinations  Other:  All other systems negative  Physical Examination:   VS: BP (!) 101/51 (BP Location: Right Arm)   Pulse 77   Temp 98.2 F (36.8 C) (Oral)   Resp 18   Ht 4\' 9"  (1.448 m)   Wt 90 lb 6.4 oz (41 kg)   SpO2 97%   BMI 19.56 kg/m   General Appearance: No distress, frial lady, sitting up in bed  Neuro: without focal findings, mental status, speech normal, alert  cranial nerves 2-12 intact, reflexes normal and symmetric, sensation grossly normal  HEENT: PERRLA, EOM intact, no ptosis, no other lesions noticed,  NECK: Supple, no stridor, mild jvd.  Pulmonary:.No wheezing, No rales  Sputum Production:   Cardiovascular:  Normal S1,S2.  No m/r/g.   pulsation normal.    Abdomen:Benign, Soft, non-tender, No masses, hepatosplenomegaly, No lymphadenopathy Endoc: No evident thyromegaly, no signs of acromegaly or Cushing features Skin:   warm, no rashes, no ecchymosis  Extremities: normal, no cyanosis, + ve edema, warm with normal capillary refill.    Labs results:   Recent Labs    01/07/18 1042 01/08/18 0311  HGB 14.2 13.4  HCT 44.5 42.3  MCV 97.9 97.7  WBC 6.8 6.0  BUN 52* 46*  CREATININE 0.97 0.82  GLUCOSE 104* 102*  CALCIUM 8.6* 7.9*  INR 1.13  --   ,      Rad results:   Ct Angio Chest Pe W Or Wo Contrast  Result Date: 01/08/2018 CLINICAL DATA:  Increasing lower extremity edema, shortness of breath and dyspnea on exertion. History of chronic diastolic heart failure. Pulmonary edema on radiographs. EXAM: CT ANGIOGRAPHY CHEST WITH CONTRAST TECHNIQUE: Multidetector CT imaging of the chest was performed using the  standard protocol during bolus administration of intravenous contrast. Multiplanar CT image reconstructions and MIPs were obtained to evaluate the vascular anatomy. CONTRAST:  20mL ISOVUE-370 IOPAMIDOL (ISOVUE-370) INJECTION 76% COMPARISON:  Chest radiograph 01/07/2018. Thoracic MRI 12/28/2013. Report only from chest CT 08/31/2003. FINDINGS: Cardiovascular: The pulmonary arteries are well opacified with contrast to the level of the subsegmental branches. There is no evidence of acute pulmonary embolism. There is central enlargement of the pulmonary arteries consistent with pulmonary arterial hypertension. There is limited enhancement of the systemic arteries. Mild aortic and coronary artery atherosclerosis. The heart is significantly enlarged. There are aortic valvular calcifications. No pericardial effusion. Mediastinum/Nodes: Mildly enlarged mediastinal and hilar lymph nodes, including a 1.5 cm subcarinal node on image 39 of series 5 and a 1.4 cm right hilar node on image 45. The thyroid gland, trachea and esophagus demonstrate no significant findings. Lungs/Pleura: Small dependent right-greater-than-left pleural effusions. No pneumothorax. Mild emphysema. There is  right lower lobe airspace disease with volume loss, probably atelectasis. Patchy airspace disease is also present anteriorly in the right upper lobe, in the right middle and left lower lobes. No discrete pulmonary nodules. Upper abdomen: Prominent reflux of contrast into the IVC and hepatic veins. Musculoskeletal/Chest wall: Paucity of subcutaneous and mediastinal fat. There is mild edema throughout the soft tissues. Superior endplate compression fractures at T6 and T11 are new from the 2015 thoracic MRI. T12 and L1 compression fractures are stable. Review of the MIP images confirms the above findings. IMPRESSION: 1. No evidence of acute pulmonary embolism. 2. Marked cardiomegaly with central enlargement of the pulmonary arteries consistent with  pulmonary arterial hypertension. 3. Anasarca with generalized soft tissue edema and small bilateral pleural effusions. 4. Right-greater-than-left basilar pulmonary opacities, probably atelectasis. Radiographic follow up recommended. 5. Age-indeterminate T6 and T11 thoracic compression fractures, new from MRI of 2015. Electronically Signed   By: Richardean Sale M.D.   On: 01/08/2018 12:11     Assessment and Plan: Her objective findings certainly point to pulmonary hypertension. Thus far appears to be type II ( cardiac) and type III ( copd).  [place] IV. Base on this, at present she does not qualify for pumonary vasodilator therapy. No obvious signs of pulmonary hypertension.  The course of action is to treat the underlying causes -diuresis -ideally right heart would be great to clarify category of pulm htn -dvt prophylqxis -Oxygen -overnight oximetry -following cardiology redcs -copd regimen ( anticholinergic and prn albuterol) -out patient follow up.      I have personally obtained a history, examined the patient, evaluated laboratory and imaging results, formulated the assessment and plan and placed orders.  The Patient requires high complexity decision making for assessment and support, frequent evaluation and titration of therapies, application of advanced monitoring technologies and extensive interpretation of multiple databases.   Adden Strout,M.D. Board certified in [facility]

## 2018-01-08 NOTE — Progress Notes (Signed)
ANTICOAGULATION CONSULT NOTE - Pharmacy Consult for Heparin  Indication: chest pain/ACS  Allergies  Allergen Reactions  . Penicillins     Rash     Patient Measurements: Height: 4\' 9"  (144.8 cm) Weight: 90 lb 6.4 oz (41 kg) IBW/kg (Calculated) : 38.6 Heparin Dosing Weight: 41 kg   Vital Signs: Temp: 98.2 F (36.8 C) (01/30 0807) Temp Source: Oral (01/30 0807) BP: 101/51 (01/30 0807) Pulse Rate: 77 (01/30 0807)  Labs: Recent Labs    01/07/18 1042 01/07/18 2105 01/08/18 0311 01/08/18 0654 01/08/18 1651  HGB 14.2  --  13.4  --   --   HCT 44.5  --  42.3  --   --   PLT 234  --  232  --   --   APTT 29  --   --   --   --   LABPROT 14.4  --   --   --   --   INR 1.13  --   --   --   --   HEPARINUNFRC  --  <0.10*  --  0.27* 0.34  CREATININE 0.97  --  0.82  --   --   TROPONINI 0.99* 1.63* 2.06* 1.84*  --     Estimated Creatinine Clearance: 31.1 mL/min (by C-G formula based on SCr of 0.82 mg/dL).   Medical History: Past Medical History:  Diagnosis Date  . 1st degree AV block   . Arthritis   . Chronic diastolic CHF (congestive heart failure) ([place])    a. echo 2014: EF 55-60%, nl wall motion, GR1DD, mild MR, moder mitral prolapse, PASP 40 mm Hg; b. echo 09/2015: EF 60-65%, nl wall motion, GR1DD, mod MR, mild to mod TR, PASP 56 mm Hg  . COPD (chronic obstructive pulmonary disease) ([place])   . Degenerative disc disease, lumbar   . Gastro-esophageal reflux   . Mitral prolapse   . Mitral regurgitation   . Osteoporosis     Assessment: Pharmacy consulted to dose and monitor Heparin in this 84 year old woman being treated for ACS/STEMI. Patient was not on anticoagulants PTA per med rec.  Goal of Therapy:  Heparin level 0.3-0.7 units/ml Monitor platelets by anticoagulation protocol: Yes   Plan:  HL = 0.27 remains subtherapeutic on infusion of 650 units/hr. Will give 650 unit bolus and increase rate to 750 units/hr. Will check HL in 8 hours.  CBC ordered with AM labs  tomorrow.  1/30@1740  HL 0.34, continue current rate of heparin iv 750 units per hour. Will recheck HL in 8 hours to ensure therapeutic per protocol.   Thomasenia Sales, PharmD, BCPS Clinical Pharmacist 01/08/2018

## 2018-01-08 NOTE — Progress Notes (Signed)
Progress Note  Patient Name: Jennifer Skinner Date of Encounter: 01/08/2018  Primary Cardiologist: Rockey Situ  Subjective   No acute overnight events. Breathing is "ok." Troponin peaked at 2.06. Echo showed EF of 55-60%, no RWMA, Gr1DD, moderate to severe MR, moderately dilated LA, moderately dilated RV with moderately to severely reduced RVSF, moderately dilated RA, moderate TR, PASP 70 mmHg. Remains on heparin gtt.   Inpatient Medications    Scheduled Meds: . aspirin  325 mg Oral Daily  . calcium carbonate  2.5 tablet Oral BID  . furosemide  80 mg Intravenous BID  . gabapentin  100 mg Oral TID  . levothyroxine  25 mcg Oral QAC breakfast  . metoprolol tartrate  12.5 mg Oral BID  . mirabegron ER  25 mg Oral Daily  . multivitamin with minerals   Oral Daily  . nitroGLYCERIN  0.5 inch Topical Q6H  . potassium chloride  10 mEq Oral QID  . rosuvastatin  10 mg Oral Daily  . sodium chloride flush  3 mL Intravenous Q12H   Continuous Infusions: . sodium chloride    . heparin 750 Units/hr (01/08/18 0900)   PRN Meds: sodium chloride, acetaminophen **OR** acetaminophen, albuterol, bisacodyl, hydrALAZINE, ondansetron **OR** ondansetron (ZOFRAN) IV, senna-docusate, sodium chloride flush, traMADol   Vital Signs    Vitals:   01/07/18 1928 01/07/18 2324 01/08/18 0343 01/08/18 0807  BP: (!) 100/48 (!) 106/52 (!) 96/40 (!) 101/51  Pulse: 97 84 72 77  Resp: 18  18 18   Temp: 97.9 F (36.6 C)  98.5 F (36.9 C) 98.2 F (36.8 C)  TempSrc:   Oral Oral  SpO2: 97%  96% 97%  Weight:   90 lb 6.4 oz (41 kg)   Height:        Intake/Output Summary (Last 24 hours) at 01/08/2018 1110 Last data filed at 01/08/2018 1054 Gross per 24 hour  Intake 317.08 ml  Output 1700 ml  Net -1382.92 ml   Filed Weights   01/07/18 1807 01/08/18 0343  Weight: 94 lb 11.2 oz (43 kg) 90 lb 6.4 oz (41 kg)    Telemetry    NSR with PACs/PVCs - Personally Reviewed  ECG    n/a - Personally Reviewed  Physical  Exam   GEN: Frail and elderly appearing; No acute distress.   Neck: No JVD. Cardiac: RRR, III/VI systolic murmur at the apex, rubs, or gallops.  Respiratory: Clear to auscultation bilaterally.  GI: Soft, nontender, non-distended.   MS: No edema; No deformity. Neuro:  Alert and oriented x 3; Nonfocal.  Psych: Normal affect.  Labs    Chemistry Recent Labs  Lab 01/07/18 1042 01/08/18 0311  NA 133* 136  K 5.2* 3.6  CL 97* 102  CO2 27 31  GLUCOSE 104* 102*  BUN 52* 46*  CREATININE 0.97 0.82  CALCIUM 8.6* 7.9*  PROT 7.1 6.3*  ALBUMIN 3.3* 2.8*  AST 112* 93*  ALT 102* 88*  ALKPHOS 89 77  BILITOT 0.6 0.7  GFRNONAA 52* >60  GFRAA >60 >60  ANIONGAP 9 3*     Hematology Recent Labs  Lab 01/07/18 1042 01/08/18 0311  WBC 6.8 6.0  RBC 4.55 4.32  HGB 14.2 13.4  HCT 44.5 42.3  MCV 97.9 97.7  MCH 31.2 31.0  MCHC 31.9* 31.7*  RDW 14.8* 14.9*  PLT 234 232    Cardiac Enzymes Recent Labs  Lab 01/07/18 1042 01/07/18 2105 01/08/18 0311 01/08/18 0654  TROPONINI 0.99* 1.63* 2.06* 1.84*   No results  for input(s): TROPIPOC in the last 168 hours.   BNP Recent Labs  Lab 01/07/18 1042  BNP 3,070.0*     DDimer No results for input(s): DDIMER in the last 168 hours.   Radiology    Dg Chest 1 View  Result Date: 01/07/2018 IMPRESSION: Mild CHF. Electronically Signed   By: Kathreen Devoid   On: 01/07/2018 11:10    Cardiac Studies   TTE 01/07/18: Study Conclusions  - Left ventricle: The cavity size was normal. There was mild   concentric hypertrophy. Systolic function was normal. The   estimated ejection fraction was in the range of 55% to 60%. Wall   motion was normal; there were no regional wall motion   abnormalities. Doppler parameters are consistent with abnormal   left ventricular relaxation (grade 1 diastolic dysfunction). - Ventricular septum: The contour showed diastolic flattening. - Mitral valve: There was moderate to severe regurgitation directed    eccentrically. - Left atrium: The atrium was moderately dilated. - Right ventricle: The cavity size was moderately dilated. Wall   thickness was normal. Systolic function was moderately to   severely reduced. - Right atrium: The atrium was moderately dilated. - Tricuspid valve: There was moderate regurgitation. - Pulmonary arteries: Systolic pressure was severely elevated. PA   peak pressure: 70 mm Hg (S).  Patient Profile     84 y.o. female with history of chronic diastolic heart failure complicated by mitral valve prolapse/regurgitation, COPD, and GERD who is being seen today for the evaluation of shortness of breath and elevated troponin at the request of Dr. Benjie Karvonen.  Assessment & Plan    1. Acute respiratory failure with hypoxia/severe pulmonary HTN/right-sided heart failure/dilated cardiomyopathy: -Remains hypoxic on room air requiring supplemental oxygen at 4 L via nasal cannula -Check CTA chest to evaluate for PE today -If no PE noted, may need VQ scan for possible chronic PE. Would also escalate pulmonary HTN therapy with addition of Revatio and Lasix with possible RHC prior to discharge -Would benefit from evaluation at [facility] advanced heart failure clinic -Continue IV Lasix 80 mg bid with KCl repletion  2. Elevated troponin: -Peaked at 2.06 -Echo as above -Heparin gtt -CTA as above to evaluate for PE (VQ scan ideal for chronic PE) -ASA  3. Hypoalbuminemia: -Possibly playing a role in 3rd spacing -Per IM  4. AKI: -Stable  5. Elevated LFTs/possible hepatic congestion: -Improving with diuresis   For questions or updates, please contact [place] Please consult [email or web address] for contact info under Cardiology/STEMI.    Signed, Christell Faith, PA-C [place] Pager: [phone] 01/08/2018, 11:10 AM   Attending Note Patient seen and examined, agree with detailed note above,  Patient presentation and plan discussed on rounds.   EKG lab work, chest x-ray,  echocardiogram reviewed independently by myself  Worsening shortness of breath, weakness at home, trembling Some tight legs, leg swelling/edema per the husband Edema slightly improved on today's visit/evaluation Supine in bed comfortable no respiratory distress Husband reports she is not eating well at home losing weight  Echocardiogram reviewed showing very dilated RV, right atrium, severely depressed RV function, moderately elevated right heart pressures greater than 70 Normal LV function, mitral valve regurgitation moderate unchanged from 2 or 3 years ago  On physical exam bony/cachectic, JVD 12+, lungs with dullness at the bases otherwise clear, heart sounds regular with 2/6 systolic ejection murmur heard left sternal border, thin nontender, trace lower extremity edema  Lab work shows potassium 3.6, creatinine 0.82, albumin 2.8, troponin trending  down 2.06 down to 1.84 total cholesterol 125  A/P  --- Severe pulmonary hypertension Worsening RV function, RV dilated compared to 2 years ago Unable to exclude chronic thromboemboli Recommend CTA chest to rule out PE  If negative would continue treatment for pulmonary hypertension Could consider right heart catheterization if she would allow for documentation Consideration of sildenafil 20 mg 3 times daily with gentle diuresis  --- Elevated troponin Likely demand ischemia, elevated right heart pressures, RV stretch Normal LV function, less likely coronary ischemia  --- Anorexia Albumin low, husband reports not eating, losing weight Failure to thrive Consider palliative consultation given above  Case discussed with medicine service and with patient and husband in detail Greater than 50% was spent in counseling and coordination of care with patient Total encounter time 35 minutes or more   Signed: Esmond Plants  M.D., Ph.D. [facility]

## 2018-01-08 NOTE — Plan of Care (Signed)
Pt is A&Ox4. VSS. 4L O2 [place] . NSR on monitor. Family at bedside. Heparin gtt continued per order. Pt went for chest CT this shift. No complaints thus far. Will continue to monitor and report to oncoming RN .  Progressing Education: Knowledge of General Education information will improve 01/08/2018 1508 - Progressing by Aleen Campi, RN Health Behavior/Discharge Planning: Ability to manage health-related needs will improve 01/08/2018 1508 - Progressing by Aleen Campi, RN Clinical Measurements: Ability to maintain clinical measurements within normal limits will improve 01/08/2018 1508 - Progressing by Aleen Campi, RN Will remain free from infection 01/08/2018 1508 - Progressing by Aleen Campi, RN Diagnostic test results will improve 01/08/2018 1508 - Progressing by Aleen Campi, RN Respiratory complications will improve 01/08/2018 1508 - Progressing by Aleen Campi, RN Cardiovascular complication will be avoided 01/08/2018 1508 - Progressing by Aleen Campi, RN Activity: Risk for activity intolerance will decrease 01/08/2018 1508 - Progressing by Aleen Campi, RN Nutrition: Adequate nutrition will be maintained 01/08/2018 1508 - Progressing by Aleen Campi, RN Coping: Level of anxiety will decrease 01/08/2018 1508 - Progressing by Aleen Campi, RN Elimination: Will not experience complications related to bowel motility 01/08/2018 1508 - Progressing by Aleen Campi, RN Will not experience complications related to urinary retention 01/08/2018 1508 - Progressing by Aleen Campi, RN Pain Managment: General experience of comfort will improve 01/08/2018 1508 - Progressing by Aleen Campi, RN Safety: Ability to remain free from injury will improve 01/08/2018 1508 - Progressing by Aleen Campi, RN Skin Integrity: Risk for impaired skin integrity will decrease 01/08/2018 1508 - Progressing by Aleen Campi, RN

## 2018-01-09 DIAGNOSIS — I509 Heart failure, unspecified: Secondary | ICD-10-CM

## 2018-01-09 LAB — BASIC METABOLIC PANEL
Anion gap: 7 (ref 5–15)
BUN: 35 mg/dL — ABNORMAL HIGH (ref 6–20)
CHLORIDE: 100 mmol/L — AB (ref 101–111)
CO2: 35 mmol/L — ABNORMAL HIGH (ref 22–32)
Calcium: 7.9 mg/dL — ABNORMAL LOW (ref 8.9–10.3)
Creatinine, Ser: 0.72 mg/dL (ref 0.44–1.00)
GFR calc Af Amer: >60 mL/min (ref >60)
GFR calc non Af Amer: >60 mL/min (ref >60)
Glucose, Bld: 101 mg/dL — ABNORMAL HIGH (ref 65–99)
Potassium: 3.6 mmol/L (ref 3.5–5.1)
SODIUM: 142 mmol/L (ref 135–145)

## 2018-01-09 LAB — CBC
HCT: 40.5 % (ref 35.0–47.0)
HEMOGLOBIN: 13.3 g/dL (ref 12.0–16.0)
MCH: 31.5 pg (ref 26.0–34.0)
MCHC: 32.8 g/dL (ref 32.0–36.0)
MCV: 95.9 fL (ref 80.0–100.0)
PLATELETS: 239 10*3/uL (ref 150–440)
RBC: 4.22 MIL/uL (ref 3.80–5.20)
RDW: 15 % — ABNORMAL HIGH (ref 11.5–14.5)
WBC: 6.7 10*3/uL (ref 3.6–11.0)

## 2018-01-09 LAB — URINE CULTURE

## 2018-01-09 LAB — HEPARIN LEVEL (UNFRACTIONATED)
HEPARIN UNFRACTIONATED: 0.3 [IU]/mL (ref 0.30–0.70)
Heparin Unfractionated: 0.31 IU/mL (ref 0.30–0.70)

## 2018-01-09 MED ORDER — DOCUSATE SODIUM 100 MG PO CAPS
100.0000 mg | ORAL_CAPSULE | Freq: Two times a day (BID) | ORAL | Status: DC
  Administered 2018-01-09 – 2018-01-10 (×3): 100 mg via ORAL
  Filled 2018-01-09 (×3): qty 1

## 2018-01-09 MED ORDER — SILDENAFIL CITRATE 20 MG PO TABS
20.0000 mg | ORAL_TABLET | Freq: Three times a day (TID) | ORAL | Status: DC
  Administered 2018-01-09 – 2018-01-10 (×6): 20 mg via ORAL
  Filled 2018-01-09 (×9): qty 1

## 2018-01-09 MED ORDER — ZOLPIDEM TARTRATE 5 MG PO TABS
5.0000 mg | ORAL_TABLET | Freq: Every evening | ORAL | Status: DC | PRN
  Administered 2018-01-09: 5 mg via ORAL
  Filled 2018-01-09: qty 1

## 2018-01-09 MED ORDER — FUROSEMIDE 10 MG/ML IJ SOLN
40.0000 mg | Freq: Two times a day (BID) | INTRAMUSCULAR | Status: DC
  Administered 2018-01-09 – 2018-01-10 (×2): 40 mg via INTRAVENOUS
  Filled 2018-01-09 (×2): qty 4

## 2018-01-09 NOTE — Progress Notes (Signed)
Pt complaining of tightness on her belly. Page prime. Awaiting call back.

## 2018-01-09 NOTE — Progress Notes (Signed)
ANTICOAGULATION CONSULT NOTE - Pharmacy Consult for Heparin  Indication: chest pain/ACS  Allergies  Allergen Reactions  . Penicillins     Rash     Patient Measurements: Height: 4\' 9"  (144.8 cm) Weight: 88 lb 1.6 oz (40 kg) IBW/kg (Calculated) : 38.6 Heparin Dosing Weight: 40 kg   Vital Signs: Temp: 98.1 F (36.7 C) (01/31 0408) Temp Source: Oral (01/31 0408) BP: 117/42 (01/31 0944) Pulse Rate: 103 (01/31 0944)  Labs: Recent Labs    01/07/18 1042  01/07/18 2105 01/08/18 0311 01/08/18 0654 01/08/18 1651 01/09/18 0125 01/09/18 1005  HGB 14.2  --   --  13.4  --   --  13.3  --   HCT 44.5  --   --  42.3  --   --  40.5  --   PLT 234  --   --  232  --   --  239  --   APTT 29  --   --   --   --   --   --   --   LABPROT 14.4  --   --   --   --   --   --   --   INR 1.13  --   --   --   --   --   --   --   HEPARINUNFRC  --    < > <0.10*  --  0.27* 0.34 0.30 0.31  CREATININE 0.97  --   --  0.82  --   --  0.72  --   TROPONINI 0.99*  --  1.63* 2.06* 1.84*  --   --   --    < > = values in this interval not displayed.    Estimated Creatinine Clearance: 31.9 mL/min (by C-G formula based on SCr of 0.72 mg/dL).   Medical History: Past Medical History:  Diagnosis Date  . 1st degree AV block   . Arthritis   . Chronic diastolic CHF (congestive heart failure) ([place])    a. echo 2014: EF 55-60%, nl wall motion, GR1DD, mild MR, moder mitral prolapse, PASP 40 mm Hg; b. echo 09/2015: EF 60-65%, nl wall motion, GR1DD, mod MR, mild to mod TR, PASP 56 mm Hg  . COPD (chronic obstructive pulmonary disease) ([place])   . Degenerative disc disease, lumbar   . Gastro-esophageal reflux   . Mitral prolapse   . Mitral regurgitation   . Osteoporosis     Assessment: Pharmacy consulted to dose and monitor Heparin in this 84 year old woman being treated for ACS/STEMI. Patient was not on anticoagulants PTA per med rec.  Goal of Therapy:  Heparin level 0.3-0.7 units/ml Monitor platelets by  anticoagulation protocol: Yes   Plan:  Confirmatory HL = 0.31 is therapeutic. Continue heparin infusion at current rate of 750 units/hr and check HL and CBC with AM labs tomorrow per protocol.  Lenis Noon, PharmD, BCPS Clinical Pharmacist 01/09/2018

## 2018-01-09 NOTE — Progress Notes (Signed)
Patient admitted for SOB and CHF symptoms, elevated trops and pulmonary edema. Pulmonology consulted for pulmonary HTN and patient placed on sildenafil 20 mg tid. Patient also on nitro paste for elevated trops; however, contraindication for nitro + sildenafil can drop BP. Patient's BP down to 675'F systolic.  Spoke to MD to d/c nitro paste, MD notified and agrees w/ plan. Will d/c nitro ointment.  Tobie Lords, PharmD, BCPS Clinical Pharmacist 01/09/2018

## 2018-01-09 NOTE — Progress Notes (Signed)
Doctor Amelia Jo called and order colace 100 mg twice a day for stomach tightness. Will continue to monitor.

## 2018-01-09 NOTE — Progress Notes (Signed)
[place] at [facility]                                                                                                                                                                                  Patient Demographics   Jennifer Skinner, is a 84 y.o. female, DOB - 09-Aug-1931, FYB:[id number]  Admit date - 01/07/2018   Admitting Physician Bettey Costa, MD  Outpatient Primary MD for the patient is Olin Hauser, DO   LOS - 2  Subjective: Pt continues to be very weak and requiring high oxygen  Review of Systems:   CONSTITUTIONAL: No documented fever.  Positive fatigue, positive weakness. No weight gain, no weight loss.  EYES: No blurry or double vision.  ENT: No tinnitus. No postnasal drip. No redness of the oropharynx.  RESPIRATORY: No cough, no wheeze, no hemoptysis.   dyspnea.  CARDIOVASCULAR: No chest pain. No orthopnea. No palpitations. No syncope.  GASTROINTESTINAL: No nausea, no vomiting or diarrhea. No abdominal pain. No melena or hematochezia.  GENITOURINARY: No dysuria or hematuria.  ENDOCRINE: No polyuria or nocturia. No heat or cold intolerance.  HEMATOLOGY: No anemia. No bruising. No bleeding.  INTEGUMENTARY: No rashes. No lesions.  MUSCULOSKELETAL: No arthritis. No swelling. No gout.  NEUROLOGIC: No numbness, tingling, or ataxia. No seizure-type activity.  PSYCHIATRIC: No anxiety. No insomnia. No ADD.    Vitals:   Vitals:   01/08/18 2134 01/09/18 0408 01/09/18 0411 01/09/18 0944  BP: (!) 106/44 (!) 94/47  (!) 117/42  Pulse: 87 80 61 (!) 103  Resp:  18    Temp:  98.1 F (36.7 C)    TempSrc:  Oral    SpO2:  (!) 86% 90% 92%  Weight:   88 lb 1.6 oz (40 kg)   Height:        Wt Readings from Last 3 Encounters:  01/09/18 88 lb 1.6 oz (40 kg)  01/07/18 96 lb 8 oz (43.8 kg)  11/27/17 91 lb 4 oz (41.4 kg)     Intake/Output Summary (Last 24 hours) at 01/09/2018 1534 Last data filed at 01/09/2018 1500 Gross per 24 hour  Intake  900 ml  Output 900 ml  Net 0 ml    Physical Exam:   GENERAL: Pleasant-appearing in no apparent distress.  Chronically ill-appearing HEAD, EYES, EARS, NOSE AND THROAT: Atraumatic, normocephalic. Extraocular muscles are intact. Pupils equal and reactive to light. Sclerae anicteric. No conjunctival injection. No oro-pharyngeal erythema.  NECK: Supple. There is no jugular venous distention. No bruits, no lymphadenopathy, no thyromegaly.  HEART: Regular rate and rhythm,.  Systolic murmur at the, no rubs, no clicks.  LUNGS: Crackles at the bases ABDOMEN:  Soft, flat, nontender, nondistended. Has good bowel sounds. No hepatosplenomegaly appreciated.  EXTREMITIES: Positive edema NEUROLOGIC: The patient is alert, awake, and oriented x3 with no focal motor or sensory deficits appreciated bilaterally.  SKIN: Moist and warm with no rashes appreciated.  Psych: Not anxious, depressed LN: No inguinal LN enlargement    Antibiotics   Anti-infectives (From admission, onward)   None      Medications   Scheduled Meds: . aspirin  325 mg Oral Daily  . calcium carbonate  2.5 tablet Oral BID  . furosemide  40 mg Intravenous BID  . gabapentin  100 mg Oral TID  . levothyroxine  25 mcg Oral QAC breakfast  . metoprolol tartrate  12.5 mg Oral BID  . mirabegron ER  25 mg Oral Daily  . multivitamin with minerals   Oral Daily  . potassium chloride  10 mEq Oral QID  . rosuvastatin  10 mg Oral Daily  . sildenafil  20 mg Oral TID  . sodium chloride flush  3 mL Intravenous Q12H   Continuous Infusions: . sodium chloride    . heparin 750 Units/hr (01/08/18 1639)   PRN Meds:.sodium chloride, acetaminophen **OR** acetaminophen, albuterol, bisacodyl, hydrALAZINE, ondansetron **OR** ondansetron (ZOFRAN) IV, senna-docusate, sodium chloride flush, traMADol   Data Review:   Micro Results Recent Results (from the past 240 hour(s))  Urine Culture     Status: Abnormal   Collection Time: 01/07/18  1:50 PM   Result Value Ref Range Status   Specimen Description   Final    URINE, RANDOM Performed at [facility], [address] [place], [place] [address]    Special Requests   Final    NONE Performed at [facility], [address] [place], [place] [address]    Culture MULTIPLE SPECIES PRESENT, SUGGEST RECOLLECTION (A)  Final   Report Status 01/09/2018 FINAL  Final    Radiology Reports Dg Chest 1 View  Result Date: 01/07/2018 CLINICAL DATA:  Generalize weakness EXAM: CHEST 1 VIEW COMPARISON:  11/08/2015 FINDINGS: Small right pleural effusion. Diffuse bilateral interstitial thickening. No pneumothorax. Stable cardiomegaly. No acute osseous abnormality. IMPRESSION: Mild CHF. Electronically Signed   By: Kathreen Devoid   On: 01/07/2018 11:10   Ct Angio Chest Pe W Or Wo Contrast  Result Date: 01/08/2018 CLINICAL DATA:  Increasing lower extremity edema, shortness of breath and dyspnea on exertion. History of chronic diastolic heart failure. Pulmonary edema on radiographs. EXAM: CT ANGIOGRAPHY CHEST WITH CONTRAST TECHNIQUE: Multidetector CT imaging of the chest was performed using the standard protocol during bolus administration of intravenous contrast. Multiplanar CT image reconstructions and MIPs were obtained to evaluate the vascular anatomy. CONTRAST:  31mL ISOVUE-370 IOPAMIDOL (ISOVUE-370) INJECTION 76% COMPARISON:  Chest radiograph 01/07/2018. Thoracic MRI 12/28/2013. Report only from chest CT 08/31/2003. FINDINGS: Cardiovascular: The pulmonary arteries are well opacified with contrast to the level of the subsegmental branches. There is no evidence of acute pulmonary embolism. There is central enlargement of the pulmonary arteries consistent with pulmonary arterial hypertension. There is limited enhancement of the systemic arteries. Mild aortic and coronary artery atherosclerosis. The heart is significantly enlarged. There are aortic valvular calcifications. No pericardial  effusion. Mediastinum/Nodes: Mildly enlarged mediastinal and hilar lymph nodes, including a 1.5 cm subcarinal node on image 39 of series 5 and a 1.4 cm right hilar node on image 45. The thyroid gland, trachea and esophagus demonstrate no significant findings. Lungs/Pleura: Small dependent right-greater-than-left pleural effusions. No pneumothorax. Mild emphysema. There is right lower lobe airspace  disease with volume loss, probably atelectasis. Patchy airspace disease is also present anteriorly in the right upper lobe, in the right middle and left lower lobes. No discrete pulmonary nodules. Upper abdomen: Prominent reflux of contrast into the IVC and hepatic veins. Musculoskeletal/Chest wall: Paucity of subcutaneous and mediastinal fat. There is mild edema throughout the soft tissues. Superior endplate compression fractures at T6 and T11 are new from the 2015 thoracic MRI. T12 and L1 compression fractures are stable. Review of the MIP images confirms the above findings. IMPRESSION: 1. No evidence of acute pulmonary embolism. 2. Marked cardiomegaly with central enlargement of the pulmonary arteries consistent with pulmonary arterial hypertension. 3. Anasarca with generalized soft tissue edema and small bilateral pleural effusions. 4. Right-greater-than-left basilar pulmonary opacities, probably atelectasis. Radiographic follow up recommended. 5. Age-indeterminate T6 and T11 thoracic compression fractures, new from MRI of 2015. Electronically Signed   By: Richardean Sale M.D.   On: 01/08/2018 12:11     CBC Recent Labs  Lab 01/07/18 1042 01/08/18 0311 01/09/18 0125  WBC 6.8 6.0 6.7  HGB 14.2 13.4 13.3  HCT 44.5 42.3 40.5  PLT 234 232 239  MCV 97.9 97.7 95.9  MCH 31.2 31.0 31.5  MCHC 31.9* 31.7* 32.8  RDW 14.8* 14.9* 15.0*  LYMPHSABS 0.8*  --   --   MONOABS 0.8  --   --   EOSABS 0.0  --   --   BASOSABS 0.0  --   --     Chemistries  Recent Labs  Lab 01/07/18 1042 01/08/18 0311 01/09/18 0125   NA 133* 136 142  K 5.2* 3.6 3.6  CL 97* 102 100*  CO2 27 31 35*  GLUCOSE 104* 102* 101*  BUN 52* 46* 35*  CREATININE 0.97 0.82 0.72  CALCIUM 8.6* 7.9* 7.9*  AST 112* 93*  --   ALT 102* 88*  --   ALKPHOS 89 77  --   BILITOT 0.6 0.7  --    ------------------------------------------------------------------------------------------------------------------ estimated creatinine clearance is 31.9 mL/min (by C-G formula based on SCr of 0.72 mg/dL). ------------------------------------------------------------------------------------------------------------------ Recent Labs    01/07/18 2105  HGBA1C 6.3*   ------------------------------------------------------------------------------------------------------------------ Recent Labs    01/07/18 2105  CHOL 125  HDL 27*  LDLCALC 76  TRIG 109  CHOLHDL 4.6   ------------------------------------------------------------------------------------------------------------------ No results for input(s): TSH, T4TOTAL, T3FREE, THYROIDAB in the last 72 hours.  Invalid input(s): FREET3 ------------------------------------------------------------------------------------------------------------------ No results for input(s): VITAMINB12, FOLATE, FERRITIN, TIBC, IRON, RETICCTPCT in the last 72 hours.  Coagulation profile Recent Labs  Lab 01/07/18 1042  INR 1.13    No results for input(s): DDIMER in the last 72 hours.  Cardiac Enzymes Recent Labs  Lab 01/07/18 2105 01/08/18 0311 01/08/18 0654  TROPONINI 1.63* 2.06* 1.84*   ------------------------------------------------------------------------------------------------------------------ Invalid input(s): POCBNP    Assessment & Plan   84 year old female with history of chronic diastolic heart failure and COPD not requiring oxygen who presents with shortness of breath, lower extremity edema, PND and orthopnea and also found to have new oxygen requirement.   1. Acute hypoxic  respiratory failure in the setting of acute on chronic diastolic heart failure Patient with severe pulmonary hypertension, oxygen requirement still very high  2. Acute on chronic diastolic heart failure:  Appreciate cardiology input Increase IV Lasix dose due to low blood   3. Elevated troponin: Non-STEMI versus demand ischemia Per cardiology they feel that this could be related pulmonary embolism or demand ischemia patient underwent a CT per PE showed no evidence of PE  Patient started on REVATIO per cardiology  4.  Severe pulmonary hypertension: Suspect due to underlying COPD Pulmonary consult appreciated  Prognosis poor  5. Hypothyroid: Continue Synthroid  6. elevated LFTS due to hepatic congestion  Prognosis poor palliative care consult has been ordered     Code Status Orders  (From admission, onward)        Start     Ordered   01/07/18 1634  Full code  Continuous     01/07/18 1633    Code Status History    Date Active Date Inactive Code Status Order ID Comments User Context   10/11/2015 04:32 10/13/2015 14:33 Full Code [id number]  Demetrios Loll, MD Inpatient           Consults  cardiology   DVT Prophylaxis  heparin  Lab Results  Component Value Date   PLT 239 01/09/2018     Time Spent in minutes   49min  Greater than 50% of time spent in care coordination and counseling patient regarding the condition and plan of care.   Dustin Flock M.D on 01/09/2018 at 3:34 PM  Between 7am to 6pm - Pager - [phone]  After 6pm go to [email or web address] - password [email or web address] [place] [facility]   Office  [phone]

## 2018-01-09 NOTE — Plan of Care (Signed)
Pt is A&Ox4. VSS. 6L O2 [place]  . Family at bedside. OOB with standby assist. Heparin gtt continued per order. No complaints thus far. NSR on monitor. Will continue to monitor and report to oncoming RN .  Progressing Education: Knowledge of General Education information will improve 01/09/2018 1507 - Progressing by Aleen Campi, RN Health Behavior/Discharge Planning: Ability to manage health-related needs will improve 01/09/2018 1507 - Progressing by Aleen Campi, RN Clinical Measurements: Ability to maintain clinical measurements within normal limits will improve 01/09/2018 1507 - Progressing by Aleen Campi, RN Will remain free from infection 01/09/2018 1507 - Progressing by Aleen Campi, RN Diagnostic test results will improve 01/09/2018 1507 - Progressing by Aleen Campi, RN Respiratory complications will improve 01/09/2018 1507 - Progressing by Aleen Campi, RN Cardiovascular complication will be avoided 01/09/2018 1507 - Progressing by Aleen Campi, RN Activity: Risk for activity intolerance will decrease 01/09/2018 1507 - Progressing by Aleen Campi, RN Nutrition: Adequate nutrition will be maintained 01/09/2018 1507 - Progressing by Aleen Campi, RN Coping: Level of anxiety will decrease 01/09/2018 1507 - Progressing by Aleen Campi, RN Elimination: Will not experience complications related to bowel motility 01/09/2018 1507 - Progressing by Aleen Campi, RN Will not experience complications related to urinary retention 01/09/2018 1507 - Progressing by Aleen Campi, RN Pain Managment: General experience of comfort will improve 01/09/2018 1507 - Progressing by Aleen Campi, RN Safety: Ability to remain free from injury will improve 01/09/2018 1507 - Progressing by Aleen Campi, RN Skin Integrity: Risk for impaired skin integrity will decrease 01/09/2018 1507 - Progressing by Aleen Campi, RN

## 2018-01-09 NOTE — Progress Notes (Signed)
Progress Note  Patient Name: Jennifer Skinner Date of Encounter: 01/09/2018  Primary Cardiologist: Rockey Situ  Subjective   She is on 5 L nasal cannula this morning Nursing reports she was 84-85% on 2 L She denies shortness of breath, would like to go home as she is not sleeping well Does not like the bed.  Does not think it is interruption from the nurses Has not been ambulating out of her room Discussed CT scan  Reports at home she is taking torsemide 40 twice daily, high fluid intake per the husband  Results with her, no pulmonary embolism, concern for pulmonary hypertension dilated pulmonary arteries  Inpatient Medications    Scheduled Meds: . aspirin  325 mg Oral Daily  . calcium carbonate  2.5 tablet Oral BID  . furosemide  40 mg Intravenous BID  . gabapentin  100 mg Oral TID  . levothyroxine  25 mcg Oral QAC breakfast  . metoprolol tartrate  12.5 mg Oral BID  . mirabegron ER  25 mg Oral Daily  . multivitamin with minerals   Oral Daily  . potassium chloride  10 mEq Oral QID  . rosuvastatin  10 mg Oral Daily  . sildenafil  20 mg Oral TID  . sodium chloride flush  3 mL Intravenous Q12H   Continuous Infusions: . sodium chloride    . heparin 750 Units/hr (01/08/18 1639)   PRN Meds: sodium chloride, acetaminophen **OR** acetaminophen, albuterol, bisacodyl, hydrALAZINE, ondansetron **OR** ondansetron (ZOFRAN) IV, senna-docusate, sodium chloride flush, traMADol   Vital Signs    Vitals:   01/08/18 2134 01/09/18 0408 01/09/18 0411 01/09/18 0944  BP: (!) 106/44 (!) 94/47  (!) 117/42  Pulse: 87 80 61 (!) 103  Resp:  18    Temp:  98.1 F (36.7 C)    TempSrc:  Oral    SpO2:  (!) 86% 90% 92%  Weight:   88 lb 1.6 oz (40 kg)   Height:        Intake/Output Summary (Last 24 hours) at 01/09/2018 1201 Last data filed at 01/09/2018 1035 Gross per 24 hour  Intake 1012.5 ml  Output 1000 ml  Net 12.5 ml   Filed Weights   01/07/18 1807 01/08/18 0343 01/09/18 0411    Weight: 94 lb 11.2 oz (43 kg) 90 lb 6.4 oz (41 kg) 88 lb 1.6 oz (40 kg)    Telemetry    NSR - Personally Reviewed  ECG    n/a - Personally Reviewed  Physical Exam   No significant change in exam, on nasal cannula oxygen GEN: Frail and elderly appearing; No acute distress.   Neck: No JVD. Cardiac: RRR, III/VI systolic murmur at the apex, rubs, or gallops.  Respiratory:  Clear with rales at the bases GI: Soft, nontender, non-distended.   MS: No edema; No deformity. Neuro:  Alert and oriented x 3; Nonfocal.  Psych: Normal affect.  Labs    Chemistry Recent Labs  Lab 01/07/18 1042 01/08/18 0311 01/09/18 0125  NA 133* 136 142  K 5.2* 3.6 3.6  CL 97* 102 100*  CO2 27 31 35*  GLUCOSE 104* 102* 101*  BUN 52* 46* 35*  CREATININE 0.97 0.82 0.72  CALCIUM 8.6* 7.9* 7.9*  PROT 7.1 6.3*  --   ALBUMIN 3.3* 2.8*  --   AST 112* 93*  --   ALT 102* 88*  --   ALKPHOS 89 77  --   BILITOT 0.6 0.7  --   GFRNONAA 52* >60 >60  GFRAA >60 >60 >60  ANIONGAP 9 3* 7     Hematology Recent Labs  Lab 01/07/18 1042 01/08/18 0311 01/09/18 0125  WBC 6.8 6.0 6.7  RBC 4.55 4.32 4.22  HGB 14.2 13.4 13.3  HCT 44.5 42.3 40.5  MCV 97.9 97.7 95.9  MCH 31.2 31.0 31.5  MCHC 31.9* 31.7* 32.8  RDW 14.8* 14.9* 15.0*  PLT 234 232 239    Cardiac Enzymes Recent Labs  Lab 01/07/18 1042 01/07/18 2105 01/08/18 0311 01/08/18 0654  TROPONINI 0.99* 1.63* 2.06* 1.84*   No results for input(s): TROPIPOC in the last 168 hours.   BNP Recent Labs  Lab 01/07/18 1042  BNP 3,070.0*     DDimer No results for input(s): DDIMER in the last 168 hours.   Radiology    Dg Chest 1 View  Result Date: 01/07/2018 IMPRESSION: Mild CHF. Electronically Signed   By: Kathreen Devoid   On: 01/07/2018 11:10    Cardiac Studies   TTE 01/07/18: Study Conclusions  - Left ventricle: The cavity size was normal. There was mild   concentric hypertrophy. Systolic function was normal. The   estimated ejection  fraction was in the range of 55% to 60%. Wall   motion was normal; there were no regional wall motion   abnormalities. Doppler parameters are consistent with abnormal   left ventricular relaxation (grade 1 diastolic dysfunction). - Ventricular septum: The contour showed diastolic flattening. - Mitral valve: There was moderate to severe regurgitation directed   eccentrically. - Left atrium: The atrium was moderately dilated. - Right ventricle: The cavity size was moderately dilated. Wall   thickness was normal. Systolic function was moderately to   severely reduced. - Right atrium: The atrium was moderately dilated. - Tricuspid valve: There was moderate regurgitation. - Pulmonary arteries: Systolic pressure was severely elevated. PA   peak pressure: 70 mm Hg (S).  Patient Profile     84 y.o. female with history of chronic diastolic heart failure complicated by mitral valve prolapse/regurgitation, COPD, and GERD who is being seen today for the evaluation of shortness of breath and elevated troponin at the request of Dr. Benjie Karvonen.  Assessment & Plan    1. Acute respiratory failure with hypoxia/severe pulmonary HTN/right-sided heart failure/dilated cardiomyopathy: -Remains hypoxic on room air requiring supplemental oxygen CTA negative for PE CTA and echocardiogram consistent with severe pulmonary hypertension --Would continue Revatio and Lasix IV for symptom relief Consider increasing Lasix IV up to every 8hrs She does not have oxygen at home, this would likely need to be set up given hypoxia -At the time of discharge recommended decrease fluid intake and torsemide 60 twice daily  2. Elevated troponin: Likely demand ischemia in the setting of hypoxia, severe pulmonary hypertension -ASA  3. Hypoalbuminemia anorexia Severe issue, not eating at home per the husband Failure to thrive, anorexia Encouraged better nutrition  4. AKI: -Stable  5. Elevated LFTs/possible hepatic  congestion: -Improving with diuresis    Long discussion with patient and husband at the bedside, hospital service concerning above, management of pulmonary hypertension  Total encounter time more than 35 minutes  Greater than 50% was spent in counseling and coordination of care with the patient   For questions or updates, please contact [facility] Please consult [email or web address] for contact info under Cardiology/STEMI.    Signed, Signed, Esmond Plants, MD, Ph.D [facility]

## 2018-01-09 NOTE — Evaluation (Signed)
Physical Therapy Evaluation Patient Details Name: Jennifer Skinner MRN: [id number] DOB: 1931/10/15 Today's Date: 01/09/2018   History of Present Illness  Pt admitted for pulmonary HTN. Elevated troponin attributed to demand ischemia per cardio notes. History includes heart failure, COPD and is currently not on home O2. Comes to hospital with complaints of SOB and edema. Currently on 6L of O2 at this time.  Clinical Impression  Pt is a pleasant 84 year old female who was admitted for pulmonary HTN. Pt performs bed mobility with mod I, transfers with cga, and ambulation with cga and IV pole. Pt very slow and generally off baseline. Pt demonstrates deficits with strength/endurance. All mobility performed on 6L of O2 with sats decreasing to 87% and slight SOB symptoms. Would benefit from skilled PT to address above deficits and promote optimal return to PLOF. Recommend transition to [place] upon discharge from acute hospitalization.       Follow Up Recommendations Home health PT;Supervision for mobility/OOB(pt currently refusing at this time)    Equipment Recommendations  Rolling walker with 5" wheels    Recommendations for Other Services       Precautions / Restrictions Precautions Precautions: Fall Restrictions Weight Bearing Restrictions: No      Mobility  Bed Mobility Overal bed mobility: Modified Independent             General bed mobility comments: safe technique performed using railings  Transfers Overall transfer level: Needs assistance Equipment used: (IV pole) Transfers: Sit to/from Stand Sit to Stand: Min guard         General transfer comment: very kyphotic posture noted. Head down in position of comfort.  Able to hold onto IV pole for balance, recommend use of RW for additional mobility  Ambulation/Gait Ambulation/Gait assistance: Min guard Ambulation Distance (Feet): 100 Feet Assistive device: (IV pole) Gait Pattern/deviations: Step-through pattern      General Gait Details: ambulated with reciprocal gait pattern, however slow technique. All mobility performed on 6L of O2 with sats decreasing to 87% with exertion. Pt fatigues with slight unsteadiness, recommend use of RW  Stairs            Wheelchair Mobility    Modified Rankin (Stroke Patients Only)       Balance Overall balance assessment: Needs assistance Sitting-balance support: Feet supported Sitting balance-Leahy Scale: Good     Standing balance support: Bilateral upper extremity supported Standing balance-Leahy Scale: Fair                               Pertinent Vitals/Pain Pain Assessment: No/denies pain    Home Living Family/patient expects to be discharged to:: Private residence Living Arrangements: Spouse/significant other Available Help at Discharge: Family;Available 24 hours/day Type of Home: House Home Access: Stairs to enter Entrance Stairs-Rails: Can reach both(primarily uses husband to assist up stairs) Entrance Stairs-Number of Steps: 3 Home Layout: One level Home Equipment: Cane - single point      Prior Function Level of Independence: Independent         Comments: usually household ambulator without AD, however recently has been getting weaker     Hand Dominance        Extremity/Trunk Assessment   Upper Extremity Assessment Upper Extremity Assessment: Generalized weakness(B UE grossly 4/5)    Lower Extremity Assessment Lower Extremity Assessment: Generalized weakness(B LE grossly 3+/5)       Communication   Communication: No difficulties  Cognition Arousal/Alertness: Awake/alert Behavior  During Therapy: WFL for tasks assessed/performed Overall Cognitive Status: Within Functional Limits for tasks assessed                                        General Comments      Exercises Other Exercises Other Exercises: ambulated to [facility] with cga and no AD. Pt very unsteady with post leaning noted,  needs min assist for self correction. Able to perform self hygiene safely   Assessment/Plan    PT Assessment Patient needs continued PT services  PT Problem List Decreased strength;Decreased activity tolerance;Decreased balance;Decreased mobility       PT Treatment Interventions Gait training;DME instruction;Therapeutic exercise    PT Goals (Current goals can be found in the Care Plan section)  Acute Rehab PT Goals Patient Stated Goal: to get stronger PT Goal Formulation: With patient Time For Goal Achievement: 01/23/18 Potential to Achieve Goals: Good    Frequency Min 2X/week   Barriers to discharge        Co-evaluation               AM-PAC PT "6 Clicks" Daily Activity  Outcome Measure Difficulty turning over in bed (including adjusting bedclothes, sheets and blankets)?: None Difficulty moving from lying on back to sitting on the side of the bed? : None Difficulty sitting down on and standing up from a chair with arms (e.g., wheelchair, bedside commode, etc,.)?: Unable Help needed moving to and from a bed to chair (including a wheelchair)?: A Little Help needed walking in hospital room?: A Little Help needed climbing 3-5 steps with a railing? : A Lot 6 Click Score: 17    End of Session Equipment Utilized During Treatment: Gait belt;Oxygen Activity Tolerance: Patient tolerated treatment well Patient left: in bed;with bed alarm set Nurse Communication: Mobility status PT Visit Diagnosis: Unsteadiness on feet (R26.81);Muscle weakness (generalized) (M62.81);Difficulty in walking, not elsewhere classified (R26.2)    Time: 6754-4920 PT Time Calculation (min) (ACUTE ONLY): 22 min   Charges:   PT Evaluation $PT Eval Low Complexity: 1 Low PT Treatments $Therapeutic Activity: 8-22 mins   PT G CodesGreggory Stallion, PT, DPT [phone]   Jennifer Skinner 01/09/2018, 5:25 PM

## 2018-01-09 NOTE — Progress Notes (Signed)
ANTICOAGULATION CONSULT NOTE - Pharmacy Consult for Heparin  Indication: chest pain/ACS  Allergies  Allergen Reactions  . Penicillins     Rash     Patient Measurements: Height: 4\' 9"  (144.8 cm) Weight: 90 lb 6.4 oz (41 kg) IBW/kg (Calculated) : 38.6 Heparin Dosing Weight: 41 kg   Vital Signs: Temp: 98.3 F (36.8 C) (01/30 1942) Temp Source: Oral (01/30 1942) BP: 106/44 (01/30 2134) Pulse Rate: 87 (01/30 2134)  Labs: Recent Labs    01/07/18 1042  01/07/18 2105 01/08/18 0311 01/08/18 0654 01/08/18 1651 01/09/18 0125  HGB 14.2  --   --  13.4  --   --  13.3  HCT 44.5  --   --  42.3  --   --  40.5  PLT 234  --   --  232  --   --  239  APTT 29  --   --   --   --   --   --   LABPROT 14.4  --   --   --   --   --   --   INR 1.13  --   --   --   --   --   --   HEPARINUNFRC  --    < > <0.10*  --  0.27* 0.34 0.30  CREATININE 0.97  --   --  0.82  --   --  0.72  TROPONINI 0.99*  --  1.63* 2.06* 1.84*  --   --    < > = values in this interval not displayed.    Estimated Creatinine Clearance: 31.9 mL/min (by C-G formula based on SCr of 0.72 mg/dL).   Medical History: Past Medical History:  Diagnosis Date  . 1st degree AV block   . Arthritis   . Chronic diastolic CHF (congestive heart failure) ([place])    a. echo 2014: EF 55-60%, nl wall motion, GR1DD, mild MR, moder mitral prolapse, PASP 40 mm Hg; b. echo 09/2015: EF 60-65%, nl wall motion, GR1DD, mod MR, mild to mod TR, PASP 56 mm Hg  . COPD (chronic obstructive pulmonary disease) ([place])   . Degenerative disc disease, lumbar   . Gastro-esophageal reflux   . Mitral prolapse   . Mitral regurgitation   . Osteoporosis     Assessment: Pharmacy consulted to dose and monitor Heparin in this 84 year old woman being treated for ACS/STEMI. Patient was not on anticoagulants PTA per med rec.  Goal of Therapy:  Heparin level 0.3-0.7 units/ml Monitor platelets by anticoagulation protocol: Yes   Plan:  HL = 0.27 remains  subtherapeutic on infusion of 650 units/hr. Will give 650 unit bolus and increase rate to 750 units/hr. Will check HL in 8 hours.  CBC ordered with AM labs tomorrow.  1/30@1740  HL 0.34, continue current rate of heparin iv 750 units per hour. Will recheck HL in 8 hours to ensure therapeutic per protocol.   01/31 @ 1000 HL 0.30 therapeutic, but trending down, will continue current rate and will recheck another HL @ 1000.  Tobie Lords, PharmD, BCPS Clinical Pharmacist 01/09/2018

## 2018-01-09 NOTE — Progress Notes (Signed)
SATURATION QUALIFICATIONS: (This note is used to comply with regulatory documentation for home oxygen)  Patient Saturations on Room Air at Rest = 84%  Patient Saturations on Room Air while Ambulating = 80%  Patient Saturations on 6 Liters of oxygen while Ambulating = 92%  Please briefly explain why patient needs home oxygen: Pt desats on RA

## 2018-01-10 DIAGNOSIS — Z7189 Other specified counseling: Secondary | ICD-10-CM

## 2018-01-10 DIAGNOSIS — E43 Unspecified severe protein-calorie malnutrition: Secondary | ICD-10-CM

## 2018-01-10 DIAGNOSIS — Z515 Encounter for palliative care: Secondary | ICD-10-CM

## 2018-01-10 LAB — BASIC METABOLIC PANEL
Anion gap: 11 (ref 5–15)
BUN: 27 mg/dL — AB (ref 6–20)
CO2: 33 mmol/L — ABNORMAL HIGH (ref 22–32)
CREATININE: 0.55 mg/dL (ref 0.44–1.00)
Calcium: 8.5 mg/dL — ABNORMAL LOW (ref 8.9–10.3)
Chloride: 98 mmol/L — ABNORMAL LOW (ref 101–111)
GFR calc non Af Amer: >60 mL/min (ref >60)
Glucose, Bld: 102 mg/dL — ABNORMAL HIGH (ref 65–99)
POTASSIUM: 3.9 mmol/L (ref 3.5–5.1)
SODIUM: 142 mmol/L (ref 135–145)

## 2018-01-10 LAB — CBC
HCT: 44.3 % (ref 35.0–47.0)
HEMOGLOBIN: 14.3 g/dL (ref 12.0–16.0)
MCH: 31.2 pg (ref 26.0–34.0)
MCHC: 32.3 g/dL (ref 32.0–36.0)
MCV: 96.6 fL (ref 80.0–100.0)
Platelets: 227 10*3/uL (ref 150–440)
RBC: 4.58 MIL/uL (ref 3.80–5.20)
RDW: 14.9 % — AB (ref 11.5–14.5)
WBC: 6.1 10*3/uL (ref 3.6–11.0)

## 2018-01-10 LAB — HEPARIN LEVEL (UNFRACTIONATED)

## 2018-01-10 MED ORDER — FUROSEMIDE 10 MG/ML IJ SOLN
40.0000 mg | INTRAMUSCULAR | Status: DC
  Administered 2018-01-10 – 2018-01-11 (×3): 40 mg via INTRAVENOUS
  Filled 2018-01-10 (×3): qty 4

## 2018-01-10 MED ORDER — ENSURE ENLIVE PO LIQD
237.0000 mL | Freq: Three times a day (TID) | ORAL | Status: DC
  Administered 2018-01-10 (×2): 237 mL via ORAL

## 2018-01-10 MED ORDER — HEPARIN SODIUM (PORCINE) 5000 UNIT/ML IJ SOLN
5000.0000 [IU] | Freq: Two times a day (BID) | INTRAMUSCULAR | Status: DC
  Administered 2018-01-10: 5000 [IU] via SUBCUTANEOUS
  Filled 2018-01-10: qty 1

## 2018-01-10 NOTE — Progress Notes (Addendum)
Progress Note  Patient Name: Jennifer Skinner Date of Encounter: 01/10/2018  Primary Cardiologist: Rockey Situ  Subjective   5 L nasal cannula Saturations continue to run in the high 80s Denies having shortness of breath,   Long discussion with patient and her husband at the bedside At home she was not going outside the house, too weak, would fall asleep Often falling asleep at meals Compliant with her torsemide at home  Interested in setting up oxygen at home  Inpatient Medications    Scheduled Meds: . aspirin  325 mg Oral Daily  . calcium carbonate  2.5 tablet Oral BID  . docusate sodium  100 mg Oral BID  . feeding supplement (ENSURE ENLIVE)  237 mL Oral TID BM  . furosemide  40 mg Intravenous [id number]  . gabapentin  100 mg Oral TID  . levothyroxine  25 mcg Oral QAC breakfast  . metoprolol tartrate  12.5 mg Oral BID  . mirabegron ER  25 mg Oral Daily  . multivitamin with minerals   Oral Daily  . potassium chloride  10 mEq Oral QID  . rosuvastatin  10 mg Oral Daily  . sildenafil  20 mg Oral TID  . sodium chloride flush  3 mL Intravenous Q12H   Continuous Infusions: . sodium chloride     PRN Meds: sodium chloride, acetaminophen **OR** acetaminophen, albuterol, bisacodyl, hydrALAZINE, ondansetron **OR** ondansetron (ZOFRAN) IV, senna-docusate, sodium chloride flush, traMADol   Vital Signs    Vitals:   01/09/18 2142 01/10/18 0511 01/10/18 0513 01/10/18 0745  BP: (!) 130/47 (!) 128/58  (!) 133/57  Pulse: 85 80 83 88  Resp:  (!) 24    Temp:  97.8 F (36.6 C)  (!) 97.5 F (36.4 C)  TempSrc:    Oral  SpO2: 92% (!) 84% 91% (!) 88%  Weight:  82 lb 12.8 oz (37.6 kg)    Height:        Intake/Output Summary (Last 24 hours) at 01/10/2018 1212 Last data filed at 01/10/2018 1028 Gross per 24 hour  Intake 1162.5 ml  Output 976 ml  Net 186.5 ml   Filed Weights   01/08/18 0343 01/09/18 0411 01/10/18 0511  Weight: 90 lb 6.4 oz (41 kg) 88 lb 1.6 oz (40 kg) 82 lb 12.8 oz  (37.6 kg)    Telemetry    NSR - Personally Reviewed  ECG    n/a - Personally Reviewed  Physical Exam   No significant change in exam, on nasal cannula oxygen GEN: Frail/cachectic, elderly appearing; No acute distress.  On nasal cannula 5 L Neck: No JVD. Cardiac: RRR, III/VI systolic murmur at the apex, rubs, or gallops.  Respiratory:  Clear with rales at the bases GI: Soft, nontender, non-distended.   MS: No edema; No deformity. Neuro:  Alert and oriented x 3; Nonfocal.  Psych: Normal affect.  Labs    Chemistry Recent Labs  Lab 01/07/18 1042 01/08/18 0311 01/09/18 0125 01/10/18 0457  NA 133* 136 142 142  K 5.2* 3.6 3.6 3.9  CL 97* 102 100* 98*  CO2 27 31 35* 33*  GLUCOSE 104* 102* 101* 102*  BUN 52* 46* 35* 27*  CREATININE 0.97 0.82 0.72 0.55  CALCIUM 8.6* 7.9* 7.9* 8.5*  PROT 7.1 6.3*  --   --   ALBUMIN 3.3* 2.8*  --   --   AST 112* 93*  --   --   ALT 102* 88*  --   --   ALKPHOS 89 77  --   --  BILITOT 0.6 0.7  --   --   GFRNONAA 52* >60 >60 >60  GFRAA >60 >60 >60 >60  ANIONGAP 9 3* 7 11     Hematology Recent Labs  Lab 01/08/18 0311 01/09/18 0125 01/10/18 0457  WBC 6.0 6.7 6.1  RBC 4.32 4.22 4.58  HGB 13.4 13.3 14.3  HCT 42.3 40.5 44.3  MCV 97.7 95.9 96.6  MCH 31.0 31.5 31.2  MCHC 31.7* 32.8 32.3  RDW 14.9* 15.0* 14.9*  PLT 232 239 227    Cardiac Enzymes Recent Labs  Lab 01/07/18 1042 01/07/18 2105 01/08/18 0311 01/08/18 0654  TROPONINI 0.99* 1.63* 2.06* 1.84*   No results for input(s): TROPIPOC in the last 168 hours.   BNP Recent Labs  Lab 01/07/18 1042  BNP 3,070.0*     DDimer No results for input(s): DDIMER in the last 168 hours.   Radiology    Dg Chest 1 View  Result Date: 01/07/2018 IMPRESSION: Mild CHF. Electronically Signed   By: Kathreen Devoid   On: 01/07/2018 11:10    Cardiac Studies   TTE 01/07/18: Study Conclusions  - Left ventricle: The cavity size was normal. There was mild   concentric hypertrophy.  Systolic function was normal. The   estimated ejection fraction was in the range of 55% to 60%. Wall   motion was normal; there were no regional wall motion   abnormalities. Doppler parameters are consistent with abnormal   left ventricular relaxation (grade 1 diastolic dysfunction). - Ventricular septum: The contour showed diastolic flattening. - Mitral valve: There was moderate to severe regurgitation directed   eccentrically. - Left atrium: The atrium was moderately dilated. - Right ventricle: The cavity size was moderately dilated. Wall   thickness was normal. Systolic function was moderately to   severely reduced. - Right atrium: The atrium was moderately dilated. - Tricuspid valve: There was moderate regurgitation. - Pulmonary arteries: Systolic pressure was severely elevated. PA   peak pressure: 70 mm Hg (S).  Patient Profile     84 y.o. female with history of chronic diastolic heart failure complicated by mitral valve prolapse/regurgitation, COPD, and GERD who is being seen today for the evaluation of shortness of breath and elevated troponin at the request of Dr. Benjie Karvonen.  Assessment & Plan    1. Acute respiratory failure with hypoxia/severe pulmonary HTN/right-sided heart failure/dilated cardiomyopathy: CTA chest negative for PE End-stage lung disease with severe pulmonary hypertension, right heart failure On sildenafil 20  3 times daily Previously seen by Dr. Raul Del, pulmonary --Plan is for home oxygen, palliative care consult, --As long as she will stay inpatient we will continue aggressive diuresis for symptoms --Long discussion with patient and husband, falling asleep at home likely secondary to hypoxia  Prognosis is poor, progressing over the past 2 or 3 years  2. Elevated troponin: Likely demand ischemia in the setting of hypoxia, severe pulmonary hypertension -ASA.  No further ischemic workup at this time   3. Hypoalbuminemia anorexia Severe issue, not eating at  home per the husband Falling asleep with meals Failure to thrive, anorexia Could try boost or Ensure twice a day.  In addition to meals  4. AKI: -Stable We will monitor closely on aggressive diuresis  5.  Elevated LFTs Likely secondary to hepatic congestion Continue aggressive diuresis  Long discussion concerning CODE STATUS Patient unclear what she wants to do but indicated she would likely not want chest compressions and CPR Recommended she talk with her husband and let her know her wishes  concerning being put on a ventilator -I did discuss that if she went on a ventilator there was a chance that she would not come off the ventilator given her underlying pulmonary hypertension Husband was present for conversation Husband responded "can't we just get her home today"  NEEDS PALLIATIVE CONSULTATION TO DISCUSS GOALS OF CARE, END STAGE PULMONARY DISEASE   Long discussion with patient and husband at the bedside, hospital service concerning above, management of pulmonary hypertension  Total encounter time more than 35 minutes  Greater than 50% was spent in counseling and coordination of care with the patient   For questions or updates, please contact [place] Please consult [email or web address] for contact info under Cardiology/STEMI.    Signed, Signed, Esmond Plants, MD, Ph.D [facility]

## 2018-01-10 NOTE — Progress Notes (Signed)
Advanced care plan.  Purpose of the Encounter: CODE STATUS  Parties in [place] and her husband  Patient's Decision Capacity: not intact  Subjective/Patient's story: Patient with severe pulmonary hypertension, as well as acute diastolic CHF admitted to the hospital with worsening shortness of breath requiring 6 L of oxygen    Objective/Medical story  I discussed with the patient and husband regarding overall poor prognosis and need for high oxygen therapy and her advanced age  Goals of care determination:  Full code, patient and her husband have poor understanding of current situation   CODE STATUS: Full code   Time spent discussing advanced care planning: 16 minutes

## 2018-01-10 NOTE — Consult Note (Signed)
Consultation Note Date: 01/10/2018   Patient Name: Jennifer Skinner  DOB: 07-03-1931  MRN: [id number]  Age / Sex: 84 y.o., female  PCP: Olin Hauser, DO Referring Physician: Dustin Flock, MD  Reason for Consultation: Establishing goals of care  HPI/Patient Profile: Jennifer Skinner  is a 84 y.o. female with a known history of chronic diastolic heart failure with preserved EF, COPD not on O2 who presents from [facility] CHF clinic with complaints of increasing lower extremity edema, shortness of breath, dyspnea exertion, PND and orthopnea.     Clinical Assessment and Goals of Care: Ms. Rail is resting in bed. Husband of 60 years at bedside. She lives with her husband at home. She has 2 children, 1 that lives near by and 1 that does not. She states prior to 3 weeks ago she felt pretty well. She was able to go shopping, out to eat, and to doctor's appointments. She was ambulatory with no DME. Beginning 3 weeks ago, she became for sleepy, started giving out, and nodding off. Mr. Tuccillo states he tried to get her to go to the hospital but she would not until now.  They state they have been advised she has pulmonary hypertension along with her COPD, and "it don't sound good". They state "It doesn't sound like it's going to get better, it sounds like things are going to get worse."   We discussed her diagnoses, prognosis, GOC, EOL wishes disposition and options.  She states she has a living will, and has not given this thought since then. She states her husband will know what to do. She cannot verbalize decisions on health care, and states " I don't know" or "it depends". She states she is fine with coming to the hospital if needed. She states she would like to give thought to these decisions and speak with her family.   She is okay with palliative care to follow at discharge. She does not want hospice at  this time.      SUMMARY OF RECOMMENDATIONS    Home with palliative to follow.  Code Status/Advance Care Planning:  Full code    Symptom Management:   Per primary team.   Palliative Prophylaxis:   Oral Care   Prognosis:  Poor.  Discharge Planning: Home with Palliative Services      Primary Diagnoses: Present on Admission: . Acute diastolic heart failure ([place]) . COPD (chronic obstructive pulmonary disease) ([place]) . Interstitial lung disease ([place]) . MITRAL REGURGITATION   I have reviewed the medical record, interviewed the patient and family, and examined the patient. The following aspects are pertinent.  Past Medical History:  Diagnosis Date  . 1st degree AV block   . Arthritis   . Chronic diastolic CHF (congestive heart failure) ([place])    a. echo 2014: EF 55-60%, nl wall motion, GR1DD, mild MR, moder mitral prolapse, PASP 40 mm Hg; b. echo 09/2015: EF 60-65%, nl wall motion, GR1DD, mod MR, mild to mod TR, PASP 56 mm Hg  . COPD (chronic  obstructive pulmonary disease) ([place])   . Degenerative disc disease, lumbar   . Gastro-esophageal reflux   . Mitral prolapse   . Mitral regurgitation   . Osteoporosis    Social History   Socioeconomic History  . Marital status: Married    Spouse name: None  . Number of children: None  . Years of education: None  . Highest education level: None  Social Needs  . Financial resource strain: Not hard at all  . Food insecurity - worry: Never true  . Food insecurity - inability: Never true  . Transportation needs - medical: No  . Transportation needs - non-medical: No  Occupational History  . None  Tobacco Use  . Smoking status: Never Smoker  . Smokeless tobacco: Never Used  Substance and Sexual Activity  . Alcohol use: No    Alcohol/week: 0.0 oz  . Drug use: No  . Sexual activity: No  Other Topics Concern  . None  Social History Narrative  . None   Family History  Problem Relation Age of Onset  . CVA Mother 55    . Heart attack Father 52  . Throat cancer Sister   . CAD Brother    Scheduled Meds: . aspirin  325 mg Oral Daily  . calcium carbonate  2.5 tablet Oral BID  . docusate sodium  100 mg Oral BID  . feeding supplement (ENSURE ENLIVE)  237 mL Oral TID BM  . furosemide  40 mg Intravenous [id number]  . gabapentin  100 mg Oral TID  . heparin injection (subcutaneous)  5,000 Units Subcutaneous Q12H  . levothyroxine  25 mcg Oral QAC breakfast  . metoprolol tartrate  12.5 mg Oral BID  . mirabegron ER  25 mg Oral Daily  . multivitamin with minerals   Oral Daily  . potassium chloride  10 mEq Oral QID  . rosuvastatin  10 mg Oral Daily  . sildenafil  20 mg Oral TID  . sodium chloride flush  3 mL Intravenous Q12H   Continuous Infusions: . sodium chloride     PRN Meds:.sodium chloride, acetaminophen **OR** acetaminophen, albuterol, bisacodyl, hydrALAZINE, ondansetron **OR** ondansetron (ZOFRAN) IV, senna-docusate, sodium chloride flush, traMADol Medications Prior to Admission:  Prior to Admission medications   Medication Sig Start Date End Date Taking? Authorizing Provider  acetaminophen (TYLENOL) 500 MG tablet Take 500 mg by mouth every 6 (six) hours as needed.   Yes [provider]  albuterol (PROVENTIL) (2.5 MG/3ML) 0.083% nebulizer solution Take 3 mLs (2.5 mg total) by nebulization every 6 (six) hours as needed for wheezing or shortness of breath. 10/12/15  Yes Epifanio Lesches, MD  Apple Cider Vinegar (APPLE CIDER VINEGAR ULTRA) 600 MG CAPS 2 teaspoons once a day   Yes [provider]  aspirin 325 MG tablet Take 325 mg by mouth daily.   Yes [provider]  baclofen (LIORESAL) 10 MG tablet Take 0.5-1 tablets (5-10 mg total) by mouth 3 (three) times daily as needed for muscle spasms. 08/19/17  Yes Karamalegos, Devonne Doughty, DO  Calcium Gluconate 500 MG CAPS Take 2 capsules by mouth daily.    Yes [provider]  gabapentin (NEURONTIN) 100 MG capsule Take 1  capsule (100 mg total) by mouth 3 (three) times daily. 06/04/17  Yes Karamalegos, Devonne Doughty, DO  levothyroxine (SYNTHROID, LEVOTHROID) 25 MCG tablet Take 1 tablet (25 mcg total) by mouth daily before breakfast. Ran out of meds. 02/20/17  Yes Arlis Porta., MD  metoprolol tartrate (LOPRESSOR) 25 MG  tablet Take 0.5 tablets (12.5 mg total) by mouth 2 (two) times daily. 03/12/17  Yes Karamalegos, Devonne Doughty, DO  mirabegron ER (MYRBETRIQ) 25 MG TB24 tablet Take 1 tablet (25 mg total) by mouth daily. 09/05/17  Yes Karamalegos, Devonne Doughty, DO  Multiple Vitamin (MULTI-VITAMIN DAILY PO) Take 1 tablet by mouth daily.    Yes [provider]  [name] (OYSTER CALCIUM) 500 MG TABS Take 500 mg of elemental calcium by mouth 2 (two) times daily.   Yes [provider]  polyethylene glycol powder (GLYCOLAX/MIRALAX) powder Take 17-34 g by mouth daily as needed. 08/19/17  Yes Karamalegos, Devonne Doughty, DO  potassium chloride (K-DUR) 10 MEQ tablet Take 1 tablet (10 mEq total) by mouth 4 (four) times daily. 12/17/17  Yes Gollan, Kathlene November, MD  traMADol (ULTRAM) 50 MG tablet Take 1 tablet by mouth every 6 (six) hours as needed for moderate pain.  08/23/14  Yes [provider]  torsemide (DEMADEX) 20 MG tablet Take 2 tablets (40 mg total) 2 (two) times daily by mouth. 10/28/17 11/27/17  Alisa Graff, FNP   Allergies  Allergen Reactions  . Penicillins     Rash    Review of Systems  Respiratory: Positive for shortness of breath.   Gastrointestinal: Positive for abdominal distention.    Physical Exam  Constitutional:  Thin and frail  Pulmonary/Chest: Effort normal.  Neurological: She is alert.  Oriented    Vital Signs: BP (!) 133/57 (BP Location: Right Arm)   Pulse 88   Temp (!) 97.5 F (36.4 C) (Oral)   Resp (!) 24   Ht 4\' 9"  (1.448 m)   Wt 37.6 kg (82 lb 12.8 oz)   SpO2 (!) 88%   BMI 17.92 kg/m  Pain Assessment: No/denies pain   Pain Score: 0-No pain   SpO2:  SpO2: (!) 88 % O2 Device:SpO2: (!) 88 % O2 Flow Rate: .O2 Flow Rate (L/min): 6 L/min(titrated up at this time)  IO: Intake/output summary:   Intake/Output Summary (Last 24 hours) at 01/10/2018 1503 Last data filed at 01/10/2018 1410 Gross per 24 hour  Intake 1080 ml  Output 1126 ml  Net -46 ml    LBM: Last BM Date: 01/08/18 Baseline Weight: Weight: 43 kg (94 lb 11.2 oz) Most recent weight: Weight: 37.6 kg (82 lb 12.8 oz)     Palliative Assessment/Data: 20%     Time In: 2:45 Time Out: 3:15 Time Total: 30 min Greater than 50%  of this time was spent counseling and coordinating care related to the above assessment and plan.  Signed by: Asencion Gowda, NP   Please contact Palliative Medicine Team phone at [phone] for questions and concerns.  For individual provider: See Shea Evans

## 2018-01-10 NOTE — Plan of Care (Signed)
  Health Behavior/Discharge Planning: Ability to manage health-related needs will improve 01/10/2018 1821 - Not Progressing by Daron Offer, RN Note Patient and husband in denial of severity and seriousness of current situation per both Dr. Posey Pronto and Dr. Rockey Situ today. Both had lengthy discussions with patient and husband. Will continue to support patient. Wenda Low [facility]

## 2018-01-10 NOTE — Progress Notes (Signed)
Initial Nutrition Assessment  DOCUMENTATION CODES:   Severe malnutrition in context of chronic illness  INTERVENTION:   Ensure Enlive po BID, each supplement provides 350 kcal and 20 grams of protein  Magic cup TID with meals, each supplement provides 290 kcal and 9 grams of protein  MVI daily  Liberalize diet   NUTRITION DIAGNOSIS:   Severe Malnutrition related to catabolic illness(COPD, CHF) as evidenced by severe fat depletion, severe muscle depletion.  GOAL:   Patient will meet greater than or equal to 90% of their needs  MONITOR:   PO intake, Supplement acceptance, Weight trends, Labs, I & O's, Skin  REASON FOR ASSESSMENT:   Other (Comment)(low BMI)    ASSESSMENT:   84 year old female admitted with chest pain.  Patient has a known history of mild three-vessel coronary artery disease per cardiac catheterization 05/2015, recent admission for acute pulmonary embolism in 11/2017, currently on Eliquis, history of stroke, steroid-induced type 2 diabetes, COPD, hyperlipidemia, and essential hypertension.    Pt identified for low BMI. Per chart, pt is eating 100% of meals. Per chart, pt with weight fluctuations pta. Pt with 12lb weight loss since admit; wt loss likely r/t fluid changes. RD will order supplements and MVI to help pt meet her estimated protein needs. RD will liberalize diet as pt needs the increased protein and fat. Per MD note, pt with poor prognosis; palliative care consult pending.   Medications reviewed and include: aspirin, tums, colace, lasix, synthroid, MVI, KCl  Labs reviewed: Cl 98(L), BUN 27(H), Ca 8.5(L) BNP 3070(H)- 1/29  Nutrition-Focused physical exam completed. Findings are moderate to severe fat depletion, moderate to severe muscle depletions, and mild edema.   Diet Order:  Diet Heart Room service appropriate? Yes; Fluid consistency: Thin  EDUCATION NEEDS:   No education needs have been identified at this time  Skin:  Reviewed RN  Assessment  Last BM:  2/1- type 2  Height:   Ht Readings from Last 1 Encounters:  01/07/18 4\' 9"  (1.448 m)    Weight:   Wt Readings from Last 1 Encounters:  01/10/18 82 lb 12.8 oz (37.6 kg)    Ideal Body Weight:  43 kg  BMI:  Body mass index is 17.92 kg/m.  Estimated Nutritional Needs:   Kcal:  1000-1200kcal/day   Protein:  56-77g/day   Fluid:  >1L/day or per MD  Koleen Distance MS, RD, LDN Pager #[phone] After Hours Pager: [phone]

## 2018-01-10 NOTE — Progress Notes (Addendum)
Pt was very agitated and cant sleep after taking ambien. She was also restless and husband called the nurse to come to the room to check on here five times. Will continue to monitor.

## 2018-01-10 NOTE — Progress Notes (Signed)
[place] at [facility]                                                                                                                                                                                  Patient Demographics   Jennifer Skinner, is a 84 y.o. female, DOB - [birth year], PIR:[id number]  Admit date - 01/07/2018   Admitting Physician Bettey Costa, MD  Outpatient Primary MD for the patient is Olin Hauser, DO   LOS - 3  Subjective: Patient kept insisting wanting to go home Dr. Rockey Situ was able to convince her to stay she still requiring 6 L of oxygen  Review of Systems:   CONSTITUTIONAL: No documented fever.  Positive fatigue, positive weakness. No weight gain, no weight loss.  EYES: No blurry or double vision.  ENT: No tinnitus. No postnasal drip. No redness of the oropharynx.  RESPIRATORY: No cough, no wheeze, no hemoptysis.   dyspnea.  CARDIOVASCULAR: No chest pain. No orthopnea. No palpitations. No syncope.  GASTROINTESTINAL: No nausea, no vomiting or diarrhea. No abdominal pain. No melena or hematochezia.  GENITOURINARY: No dysuria or hematuria.  ENDOCRINE: No polyuria or nocturia. No heat or cold intolerance.  HEMATOLOGY: No anemia. No bruising. No bleeding.  INTEGUMENTARY: No rashes. No lesions.  MUSCULOSKELETAL: No arthritis. No swelling. No gout.  NEUROLOGIC: No numbness, tingling, or ataxia. No seizure-type activity.  PSYCHIATRIC: No anxiety. No insomnia. No ADD.    Vitals:   Vitals:   01/10/18 0511 01/10/18 0513 01/10/18 0745 01/10/18 1715  BP: (!) 128/58  (!) 133/57 (!) 113/54  Pulse: 80 83 88 (!) 108  Resp: (!) 24     Temp: 97.8 F (36.6 C)  (!) 97.5 F (36.4 C) 97.8 F (36.6 C)  TempSrc:   Oral Oral  SpO2: (!) 84% 91% (!) 88% 90%  Weight: 82 lb 12.8 oz (37.6 kg)     Height:        Wt Readings from Last 3 Encounters:  01/10/18 82 lb 12.8 oz (37.6 kg)  01/07/18 96 lb 8 oz (43.8 kg)  11/27/17 91 lb 4 oz (41.4 kg)      Intake/Output Summary (Last 24 hours) at 01/10/2018 1730 Last data filed at 01/10/2018 1410 Gross per 24 hour  Intake 1080 ml  Output 1126 ml  Net -46 ml    Physical Exam:   GENERAL: Pleasant-appearing in no apparent distress.  Chronically ill-appearing HEAD, EYES, EARS, NOSE AND THROAT: Atraumatic, normocephalic. Extraocular muscles are intact. Pupils equal and reactive to light. Sclerae anicteric. No conjunctival injection. No oro-pharyngeal erythema.  NECK: Supple. There is no jugular venous distention. No bruits, no lymphadenopathy, no thyromegaly.  HEART: Regular rate and rhythm,.  Systolic murmur at the, no rubs, no clicks.  LUNGS: Crackles at the bases ABDOMEN: Soft, flat, nontender, nondistended. Has good bowel sounds. No hepatosplenomegaly appreciated.  EXTREMITIES: Positive edema NEUROLOGIC: The patient is alert, awake, and oriented x3 with no focal motor or sensory deficits appreciated bilaterally.  SKIN: Moist and warm with no rashes appreciated.  Psych: Not anxious, depressed LN: No inguinal LN enlargement    Antibiotics   Anti-infectives (From admission, onward)   None      Medications   Scheduled Meds: . aspirin  325 mg Oral Daily  . calcium carbonate  2.5 tablet Oral BID  . docusate sodium  100 mg Oral BID  . feeding supplement (ENSURE ENLIVE)  237 mL Oral TID BM  . furosemide  40 mg Intravenous [id number]  . gabapentin  100 mg Oral TID  . heparin injection (subcutaneous)  5,000 Units Subcutaneous Q12H  . levothyroxine  25 mcg Oral QAC breakfast  . metoprolol tartrate  12.5 mg Oral BID  . mirabegron ER  25 mg Oral Daily  . multivitamin with minerals   Oral Daily  . potassium chloride  10 mEq Oral QID  . rosuvastatin  10 mg Oral Daily  . sildenafil  20 mg Oral TID  . sodium chloride flush  3 mL Intravenous Q12H   Continuous Infusions: . sodium chloride     PRN Meds:.sodium chloride, acetaminophen **OR** acetaminophen, albuterol, bisacodyl,  hydrALAZINE, ondansetron **OR** ondansetron (ZOFRAN) IV, senna-docusate, sodium chloride flush, traMADol   Data Review:   Micro Results Recent Results (from the past 240 hour(s))  Urine Culture     Status: Abnormal   Collection Time: 01/07/18  1:50 PM  Result Value Ref Range Status   Specimen Description   Final    URINE, RANDOM Performed at [facility], [address] [place], [place] [address]    Special Requests   Final    NONE Performed at [facility], [address] [place], [place] [address]    Culture MULTIPLE SPECIES PRESENT, SUGGEST RECOLLECTION (A)  Final   Report Status 01/09/2018 FINAL  Final    Radiology Reports Dg Chest 1 View  Result Date: 01/07/2018 CLINICAL DATA:  Generalize weakness EXAM: CHEST 1 VIEW COMPARISON:  11/08/2015 FINDINGS: Small right pleural effusion. Diffuse bilateral interstitial thickening. No pneumothorax. Stable cardiomegaly. No acute osseous abnormality. IMPRESSION: Mild CHF. Electronically Signed   By: Kathreen Devoid   On: 01/07/2018 11:10   Ct Angio Chest Pe W Or Wo Contrast  Result Date: 01/08/2018 CLINICAL DATA:  Increasing lower extremity edema, shortness of breath and dyspnea on exertion. History of chronic diastolic heart failure. Pulmonary edema on radiographs. EXAM: CT ANGIOGRAPHY CHEST WITH CONTRAST TECHNIQUE: Multidetector CT imaging of the chest was performed using the standard protocol during bolus administration of intravenous contrast. Multiplanar CT image reconstructions and MIPs were obtained to evaluate the vascular anatomy. CONTRAST:  10mL ISOVUE-370 IOPAMIDOL (ISOVUE-370) INJECTION 76% COMPARISON:  Chest radiograph 01/07/2018. Thoracic MRI 12/28/2013. Report only from chest CT 08/31/2003. FINDINGS: Cardiovascular: The pulmonary arteries are well opacified with contrast to the level of the subsegmental branches. There is no evidence of acute pulmonary embolism. There is central enlargement of the pulmonary  arteries consistent with pulmonary arterial hypertension. There is limited enhancement of the systemic arteries. Mild aortic and coronary artery atherosclerosis. The heart is significantly enlarged. There are aortic valvular calcifications. No pericardial effusion. Mediastinum/Nodes: Mildly enlarged mediastinal and hilar lymph nodes, including a 1.5  cm subcarinal node on image 39 of series 5 and a 1.4 cm right hilar node on image 45. The thyroid gland, trachea and esophagus demonstrate no significant findings. Lungs/Pleura: Small dependent right-greater-than-left pleural effusions. No pneumothorax. Mild emphysema. There is right lower lobe airspace disease with volume loss, probably atelectasis. Patchy airspace disease is also present anteriorly in the right upper lobe, in the right middle and left lower lobes. No discrete pulmonary nodules. Upper abdomen: Prominent reflux of contrast into the IVC and hepatic veins. Musculoskeletal/Chest wall: Paucity of subcutaneous and mediastinal fat. There is mild edema throughout the soft tissues. Superior endplate compression fractures at T6 and T11 are new from the 2015 thoracic MRI. T12 and L1 compression fractures are stable. Review of the MIP images confirms the above findings. IMPRESSION: 1. No evidence of acute pulmonary embolism. 2. Marked cardiomegaly with central enlargement of the pulmonary arteries consistent with pulmonary arterial hypertension. 3. Anasarca with generalized soft tissue edema and small bilateral pleural effusions. 4. Right-greater-than-left basilar pulmonary opacities, probably atelectasis. Radiographic follow up recommended. 5. Age-indeterminate T6 and T11 thoracic compression fractures, new from MRI of 2015. Electronically Signed   By: Richardean Sale M.D.   On: 01/08/2018 12:11     CBC Recent Labs  Lab 01/07/18 1042 01/08/18 0311 01/09/18 0125 01/10/18 0457  WBC 6.8 6.0 6.7 6.1  HGB 14.2 13.4 13.3 14.3  HCT 44.5 42.3 40.5 44.3  PLT  234 232 239 227  MCV 97.9 97.7 95.9 96.6  MCH 31.2 31.0 31.5 31.2  MCHC 31.9* 31.7* 32.8 32.3  RDW 14.8* 14.9* 15.0* 14.9*  LYMPHSABS 0.8*  --   --   --   MONOABS 0.8  --   --   --   EOSABS 0.0  --   --   --   BASOSABS 0.0  --   --   --     Chemistries  Recent Labs  Lab 01/07/18 1042 01/08/18 0311 01/09/18 0125 01/10/18 0457  NA 133* 136 142 142  K 5.2* 3.6 3.6 3.9  CL 97* 102 100* 98*  CO2 27 31 35* 33*  GLUCOSE 104* 102* 101* 102*  BUN 52* 46* 35* 27*  CREATININE 0.97 0.82 0.72 0.55  CALCIUM 8.6* 7.9* 7.9* 8.5*  AST 112* 93*  --   --   ALT 102* 88*  --   --   ALKPHOS 89 77  --   --   BILITOT 0.6 0.7  --   --    ------------------------------------------------------------------------------------------------------------------ estimated creatinine clearance is 31.1 mL/min (by C-G formula based on SCr of 0.55 mg/dL). ------------------------------------------------------------------------------------------------------------------ Recent Labs    01/07/18 2105  HGBA1C 6.3*   ------------------------------------------------------------------------------------------------------------------ Recent Labs    01/07/18 2105  CHOL 125  HDL 27*  LDLCALC 76  TRIG 109  CHOLHDL 4.6   ------------------------------------------------------------------------------------------------------------------ No results for input(s): TSH, T4TOTAL, T3FREE, THYROIDAB in the last 72 hours.  Invalid input(s): FREET3 ------------------------------------------------------------------------------------------------------------------ No results for input(s): VITAMINB12, FOLATE, FERRITIN, TIBC, IRON, RETICCTPCT in the last 72 hours.  Coagulation profile Recent Labs  Lab 01/07/18 1042  INR 1.13    No results for input(s): DDIMER in the last 72 hours.  Cardiac Enzymes Recent Labs  Lab 01/07/18 2105 01/08/18 0311 01/08/18 0654  TROPONINI 1.63* 2.06* 1.84*    ------------------------------------------------------------------------------------------------------------------ Invalid input(s): POCBNP    Assessment & Plan   84 year old female with history of chronic diastolic heart failure and COPD not requiring oxygen who presents with shortness of breath, lower extremity edema, PND and orthopnea  and also found to have new oxygen requirement.   1. Acute hypoxic respiratory failure in the setting of acute on chronic diastolic heart failure Patient with severe pulmonary hypertension, oxygen requirement still very high Per cardiology aggressive IV Lasix therapy  2. Acute on chronic diastolic heart failure:  Appreciate cardiology input Continue aggressive IV Lasix   3. Elevated troponin: Non-STEMI versus demand ischemia Per cardiology they feel that this could be related pulmonary embolism or demand ischemia patient underwent a CT per PE showed no evidence of PE Patient started on REVATIO per cardiology  4.  Severe pulmonary hypertension: Suspect due to underlying COPD Pulmonary consult appreciated  Prognosis poor Patient started on REVATIO  5. Hypothyroid: Continue Synthroid  6. elevated LFTS due to hepatic congestion  Prognosis poor I discussed with the patient and husband regarding overall poor prognosis they still do not seem to understand the graveness and the severity of the condition     Code Status Orders  (From admission, onward)        Start     Ordered   01/07/18 1634  Full code  Continuous     01/07/18 1633    Code Status History    Date Active Date Inactive Code Status Order ID Comments User Context   10/11/2015 04:32 10/13/2015 14:33 Full Code [id number]  Demetrios Loll, MD Inpatient           Consults  cardiology   DVT Prophylaxis  heparin  Lab Results  Component Value Date   PLT 227 01/10/2018     Time Spent in minutes   78min  Greater than 50% of time spent in care coordination and counseling  patient regarding the condition and plan of care.   Dustin Flock M.D on 01/10/2018 at 5:30 PM  Between 7am to 6pm - Pager - [phone]  After 6pm go to [email or web address] - password [email or web address] [facility]   Office  [phone]

## 2018-01-10 NOTE — Care Management Important Message (Signed)
Important Message  Patient Details  Name: Jennifer Skinner MRN: [id number] Date of Birth: [birth year]   Medicare Important Message Given:  Yes  Signed IM notice given   Katrina Stack, RN 01/10/2018, 6:31 PM

## 2018-01-10 NOTE — Progress Notes (Signed)
Pt finally is resting and sleeping at this time. Will continue to monitor.

## 2018-01-10 NOTE — Plan of Care (Signed)
  Progressing Clinical Measurements: Respiratory complications will improve 01/10/2018 0035 - Progressing by Liliane Channel, RN Pain Managment: General experience of comfort will improve 01/10/2018 0035 - Progressing by Liliane Channel, RN Safety: Ability to remain free from injury will improve 01/10/2018 0035 - Progressing by Liliane Channel, RN Clinical Measurements: Diagnostic test results will improve 01/10/2018 0035 - Progressing by Liliane Channel, RN

## 2018-01-10 NOTE — Care Management (Addendum)
Patient and husband verbalize agreement to have home health nurse come to their home.  They have many questions about home oxygen and "will I have enough so I can go to church."  CM discussed that her liter flow  is of concern and most likely will need larger portable canisters and DME company will have to instruct on how long canister would last. It is reported canister will last 1-1.5 hours.  Patient is wanting to go home 2.2.   No agency preference for oxygen or home health or oxygen.  [facility] referral called to [facility]. Reaching out to [facility] as they are in network with patient's medicare uhc and agency would be able to see within 24 hours of discharge.

## 2018-01-11 MED ORDER — ENSURE ENLIVE PO LIQD: 237.0000 mL | Freq: Three times a day (TID) | ORAL | 12 refills | Status: DC

## 2018-01-11 MED ORDER — ROSUVASTATIN CALCIUM 10 MG PO TABS: 10.0000 mg | ORAL_TABLET | Freq: Every day | ORAL | 0 refills | Status: DC

## 2018-01-11 MED ORDER — SILDENAFIL CITRATE 20 MG PO TABS: 20.0000 mg | ORAL_TABLET | Freq: Three times a day (TID) | ORAL | 0 refills | Status: DC

## 2018-01-11 NOTE — Progress Notes (Signed)
Pt ambulated loop around nurses' station. Able to walk vigorously with walker. Heart rate went to 120 with exertion but sats remained 93-95% throughout. Dr Vianne Bulls notified.

## 2018-01-11 NOTE — Plan of Care (Signed)
  Progressing Elimination: Will not experience complications related to bowel motility 01/11/2018 0019 - Progressing by Tad Moore, RN Will not experience complications related to urinary retention 01/11/2018 0019 - Progressing by Tad Moore, RN Pain Managment: General experience of comfort will improve 01/11/2018 0019 - Progressing by Tad Moore, RN Safety: Ability to remain free from injury will improve 01/11/2018 0019 - Progressing by Tad Moore, RN Clinical Measurements: Will remain free from infection 01/11/2018 0019 - Progressing by Tad Moore, RN Elimination: Will not experience complications related to bowel motility 01/11/2018 0019 - Progressing by Tad Moore, RN

## 2018-01-11 NOTE — Care Management Note (Addendum)
Case Management Note  Patient Details  Name: DAHNA HATTABAUGH MRN: [id number] Date of Birth: [birth year]  Subjective/Objective:  Discussed 02 Sats with Lurlean Horns RN who reports that Ms Marker's 02 Sats were good today with ambulation and that she does not qualify for home 02. Manuela Schwartz discussed 02 Sats with Dr Vianne Bulls who gave the ok for no home 02. See Dr Governor Specking progress note. A referral for HH=PT, RN, Aide, Resp, SW was called to [employer] at [facility].                   Action/Plan:   Expected Discharge Date:  01/11/18               Expected Discharge Plan:  [place]  In-House Referral:     Discharge planning Services  CM Consult  Post Acute Care Choice:  Home Health Choice offered to:  Patient, Spouse  DME Arranged:    DME Agency:     HH Arranged:  RN, Nurse's Aide, Respirator Therapy, Social Work [employer] Agency:  [facility]  Status of Service:  Completed, signed off  If discussed at [employer], dates discussed:    Additional Comments:  Jory Welke A, RN 01/11/2018, 12:48 PM

## 2018-01-11 NOTE — Progress Notes (Signed)
Pt to be discharged today. Iv and tele removed. disch instructions given to pt and husband. Family will transport.

## 2018-01-11 NOTE — Progress Notes (Signed)
Pt to be discharged today. Iv and tele removed. disch instructions and prescrips given to pt and husband. disch via w.c. Accompanied by husband.

## 2018-01-11 NOTE — Progress Notes (Signed)
[facility]                                                                                                                                                                                  Patient Demographics   Jennifer Skinner, is a 84 y.o. female, DOB - September 06, 1931, CBJ:[id number]  Admit date - 01/07/2018   Admitting Physician Bettey Costa, MD  Outpatient Primary MD for the patient is Olin Hauser, DO   LOS - 4  Subjective: Discharge home with 6 L of oxygen.  Case manager help is appreciated to set up oxygen at home befo discharged. Spoke with patient's son and also husband.  Patient told me that she cannot sleep ER and wants to go home. Review of Systems:   CONSTITUTIONAL: No documented fever.  Positive fatigue, positive weakness. No weight gain, no weight loss.  EYES: No blurry or double vision.  ENT: No tinnitus. No postnasal drip. No redness of the oropharynx.  RESPIRATORY: No cough, no wheeze, no hemoptysis.   dyspnea.  CARDIOVASCULAR: No chest pain. No orthopnea. No palpitations. No syncope.  GASTROINTESTINAL: No nausea, no vomiting or diarrhea. No abdominal pain. No melena or hematochezia.  GENITOURINARY: No dysuria or hematuria.  ENDOCRINE: No polyuria or nocturia. No heat or cold intolerance.  HEMATOLOGY: No anemia. No bruising. No bleeding.  INTEGUMENTARY: No rashes. No lesions.  MUSCULOSKELETAL: No arthritis. No swelling. No gout.  NEUROLOGIC: No numbness, tingling, or ataxia. No seizure-type activity.  PSYCHIATRIC: No anxiety. No insomnia. No ADD.    Vitals:   Vitals:   01/10/18 1715 01/10/18 1929 01/11/18 0432 01/11/18 0742  BP: (!) 113/54 (!) 131/56 134/73 134/71  Pulse: (!) 108 97 (!) 114 (!) 109  Resp:  18 16   Temp: 97.8 F (36.6 C) 97.9 F (36.6 C) 98 F (36.7 C)   TempSrc: Oral Oral Oral   SpO2: 90% 92% 92% 95%  Weight:   37.7 kg (83 lb 3.2 oz)   Height:        Wt Readings from Last 3 Encounters:   01/11/18 37.7 kg (83 lb 3.2 oz)  01/07/18 43.8 kg (96 lb 8 oz)  11/27/17 41.4 kg (91 lb 4 oz)     Intake/Output Summary (Last 24 hours) at 01/11/2018 1123 Last data filed at 01/11/2018 1050 Gross per 24 hour  Intake 480 ml  Output 1800 ml  Net -1320 ml    Physical Exam:   GENERAL: Pleasant-appearing in no apparent distress.  Chronically ill-appearing HEAD, EYES, EARS, NOSE AND THROAT: Atraumatic, normocephalic. Extraocular muscles are intact. Pupils equal and reactive to light. Sclerae anicteric. No conjunctival injection. No oro-pharyngeal erythema.  NECK: Supple. There is no jugular venous distention. No bruits, no lymphadenopathy, no thyromegaly.  HEART: Regular rate and rhythm,.  Systolic murmur at the, no rubs, no clicks.  LUNGS: Crackles at the bases ABDOMEN: Soft, flat, nontender, nondistended. Has good bowel sounds. No hepatosplenomegaly appreciated.  EXTREMITIES: Positive edema NEUROLOGIC: The patient is alert, awake, and oriented x3 with no focal motor or sensory deficits appreciated bilaterally.  SKIN: Moist and warm with no rashes appreciated.  Psych: Not anxious, depressed LN: No inguinal LN enlargement    Antibiotics   Anti-infectives (From admission, onward)   None      Medications   Scheduled Meds: . aspirin  325 mg Oral Daily  . calcium carbonate  2.5 tablet Oral BID  . docusate sodium  100 mg Oral BID  . feeding supplement (ENSURE ENLIVE)  237 mL Oral TID BM  . furosemide  40 mg Intravenous [id number]  . gabapentin  100 mg Oral TID  . heparin injection (subcutaneous)  5,000 Units Subcutaneous Q12H  . levothyroxine  25 mcg Oral QAC breakfast  . metoprolol tartrate  12.5 mg Oral BID  . mirabegron ER  25 mg Oral Daily  . multivitamin with minerals   Oral Daily  . potassium chloride  10 mEq Oral QID  . rosuvastatin  10 mg Oral Daily  . sildenafil  20 mg Oral TID  . sodium chloride flush  3 mL Intravenous Q12H   Continuous Infusions: . sodium  chloride     PRN Meds:.sodium chloride, acetaminophen **OR** acetaminophen, albuterol, bisacodyl, hydrALAZINE, ondansetron **OR** ondansetron (ZOFRAN) IV, senna-docusate, sodium chloride flush, traMADol   Data Review:   Micro Results Recent Results (from the past 240 hour(s))  Urine Culture     Status: Abnormal   Collection Time: 01/07/18  1:50 PM  Result Value Ref Range Status   Specimen Description   Final    URINE, RANDOM Performed at [facility], [address], [place], [place] [address]    Special Requests   Final    NONE Performed at [facility], [address] [place], [place] [address]    Culture MULTIPLE SPECIES PRESENT, SUGGEST RECOLLECTION (A)  Final   Report Status 01/09/2018 FINAL  Final    Radiology Reports Dg Chest 1 View  Result Date: 01/07/2018 CLINICAL DATA:  Generalize weakness EXAM: CHEST 1 VIEW COMPARISON:  11/08/2015 FINDINGS: Small right pleural effusion. Diffuse bilateral interstitial thickening. No pneumothorax. Stable cardiomegaly. No acute osseous abnormality. IMPRESSION: Mild CHF. Electronically Signed   By: Kathreen Devoid   On: 01/07/2018 11:10   Ct Angio Chest Pe W Or Wo Contrast  Result Date: 01/08/2018 CLINICAL DATA:  Increasing lower extremity edema, shortness of breath and dyspnea on exertion. History of chronic diastolic heart failure. Pulmonary edema on radiographs. EXAM: CT ANGIOGRAPHY CHEST WITH CONTRAST TECHNIQUE: Multidetector CT imaging of the chest was performed using the standard protocol during bolus administration of intravenous contrast. Multiplanar CT image reconstructions and MIPs were obtained to evaluate the vascular anatomy. CONTRAST:  5mL ISOVUE-370 IOPAMIDOL (ISOVUE-370) INJECTION 76% COMPARISON:  Chest radiograph 01/07/2018. Thoracic MRI 12/28/2013. Report only from chest CT 08/31/2003. FINDINGS: Cardiovascular: The pulmonary arteries are well opacified with contrast to the level of the subsegmental branches.  There is no evidence of acute pulmonary embolism. There is central enlargement of the pulmonary arteries consistent with pulmonary arterial hypertension. There is limited enhancement of the systemic arteries. Mild aortic and coronary artery atherosclerosis. The heart is significantly enlarged. There are aortic valvular  calcifications. No pericardial effusion. Mediastinum/Nodes: Mildly enlarged mediastinal and hilar lymph nodes, including a 1.5 cm subcarinal node on image 39 of series 5 and a 1.4 cm right hilar node on image 45. The thyroid gland, trachea and esophagus demonstrate no significant findings. Lungs/Pleura: Small dependent right-greater-than-left pleural effusions. No pneumothorax. Mild emphysema. There is right lower lobe airspace disease with volume loss, probably atelectasis. Patchy airspace disease is also present anteriorly in the right upper lobe, in the right middle and left lower lobes. No discrete pulmonary nodules. Upper abdomen: Prominent reflux of contrast into the IVC and hepatic veins. Musculoskeletal/Chest wall: Paucity of subcutaneous and mediastinal fat. There is mild edema throughout the soft tissues. Superior endplate compression fractures at T6 and T11 are new from the 2015 thoracic MRI. T12 and L1 compression fractures are stable. Review of the MIP images confirms the above findings. IMPRESSION: 1. No evidence of acute pulmonary embolism. 2. Marked cardiomegaly with central enlargement of the pulmonary arteries consistent with pulmonary arterial hypertension. 3. Anasarca with generalized soft tissue edema and small bilateral pleural effusions. 4. Right-greater-than-left basilar pulmonary opacities, probably atelectasis. Radiographic follow up recommended. 5. Age-indeterminate T6 and T11 thoracic compression fractures, new from MRI of 2015. Electronically Signed   By: Richardean Sale M.D.   On: 01/08/2018 12:11     CBC Recent Labs  Lab 01/07/18 1042 01/08/18 0311 01/09/18 0125  01/10/18 0457  WBC 6.8 6.0 6.7 6.1  HGB 14.2 13.4 13.3 14.3  HCT 44.5 42.3 40.5 44.3  PLT 234 232 239 227  MCV 97.9 97.7 95.9 96.6  MCH 31.2 31.0 31.5 31.2  MCHC 31.9* 31.7* 32.8 32.3  RDW 14.8* 14.9* 15.0* 14.9*  LYMPHSABS 0.8*  --   --   --   MONOABS 0.8  --   --   --   EOSABS 0.0  --   --   --   BASOSABS 0.0  --   --   --     Chemistries  Recent Labs  Lab 01/07/18 1042 01/08/18 0311 01/09/18 0125 01/10/18 0457  NA 133* 136 142 142  K 5.2* 3.6 3.6 3.9  CL 97* 102 100* 98*  CO2 27 31 35* 33*  GLUCOSE 104* 102* 101* 102*  BUN 52* 46* 35* 27*  CREATININE 0.97 0.82 0.72 0.55  CALCIUM 8.6* 7.9* 7.9* 8.5*  AST 112* 93*  --   --   ALT 102* 88*  --   --   ALKPHOS 89 77  --   --   BILITOT 0.6 0.7  --   --    ------------------------------------------------------------------------------------------------------------------ estimated creatinine clearance is 31.2 mL/min (by C-G formula based on SCr of 0.55 mg/dL). ------------------------------------------------------------------------------------------------------------------ No results for input(s): HGBA1C in the last 72 hours. ------------------------------------------------------------------------------------------------------------------ No results for input(s): CHOL, HDL, LDLCALC, TRIG, CHOLHDL, LDLDIRECT in the last 72 hours. ------------------------------------------------------------------------------------------------------------------ No results for input(s): TSH, T4TOTAL, T3FREE, THYROIDAB in the last 72 hours.  Invalid input(s): FREET3 ------------------------------------------------------------------------------------------------------------------ No results for input(s): VITAMINB12, FOLATE, FERRITIN, TIBC, IRON, RETICCTPCT in the last 72 hours.  Coagulation profile Recent Labs  Lab 01/07/18 1042  INR 1.13    No results for input(s): DDIMER in the last 72 hours.  Cardiac Enzymes Recent Labs  Lab  01/07/18 2105 01/08/18 0311 01/08/18 0654  TROPONINI 1.63* 2.06* 1.84*   ------------------------------------------------------------------------------------------------------------------ Invalid input(s): POCBNP    Assessment & Plan   84 year old female with history of chronic diastolic heart failure and COPD not requiring oxygen who presents with shortness of breath, lower extremity  edema, PND and orthopnea and also found to have new oxygen requirement.   1. Acute hypoxic respiratory failure in the setting of acute on chronic diastolic heart failure Patient with severe pulmonary hypertension, oxygen requirement still very high It is a adamant that she wants to go home, discussed with cardiology, agreed for discharging home with 6 L of oxygen, continue home dose torsemide.  Patient needs outpatient follow-up with [facility] pulmonary hypertension clinic.  2. Acute on chronic diastolic heart failure:  Appreciate cardiology input Discharge home today, can continue home dose torsemide.  Discussed with cardiologist Dr. Lovena Le from [facility] health cardiology.   3. Elevated troponin: Non-STEMI versus demand ischemia Per cardiology they feel that this could be related pulmonary embolism or demand ischemia patient underwent a CT per PE showed no evidence of PE Patient started on REVATIO per cardiology  4.  Severe pulmonary hypertension: Suspect due to underlying COPD Pulmonary consult appreciated  Prognosis poor Patient started on REVATIO, prescription is given.  5. Hypothyroid: Continue Synthroid  6. elevated LFTS due to hepatic congestion  Prognosis really poor, discussed with son.  But husband does not understand the severity of her illness. Arrange home health nursing, oxygen 6 L.     Code Status Orders  (From admission, onward)        Start     Ordered   01/07/18 1634  Full code  Continuous     01/07/18 1633    Code Status History    Date Active Date Inactive Code  Status Order ID Comments User Context   10/11/2015 04:32 10/13/2015 14:33 Full Code [id number]  Demetrios Loll, MD Inpatient           Consults  cardiology   DVT Prophylaxis  heparin  Lab Results  Component Value Date   PLT 227 01/10/2018     Time Spent in minutes   67min  Greater than 50% of time spent in care coordination and counseling patient regarding the condition and plan of care.   Epifanio Lesches M.D on 01/11/2018 at 11:23 AM  Between 7am to 6pm - Pager - [phone]  After 6pm go to [email or web address] - password [email or web address] [facility]   Office  [phone]

## 2018-01-11 NOTE — Progress Notes (Signed)
Patient not hypoxic, on room air saturations are 93% on ambulation so she did not qualify for home oxygen.

## 2018-01-11 NOTE — Progress Notes (Signed)
Progress Note  Patient Name: Jennifer Skinner Date of Encounter: 01/11/2018  Primary Cardiologist: Coll[place]  Subjective   "When am I going home". Denies chest pain or sob.   Inpatient Medications    Scheduled Meds: . aspirin  325 mg Oral Daily  . calcium carbonate  2.5 tablet Oral BID  . docusate sodium  100 mg Oral BID  . feeding supplement (ENSURE ENLIVE)  237 mL Oral TID BM  . furosemide  40 mg Intravenous [id number]  . gabapentin  100 mg Oral TID  . heparin injection (subcutaneous)  5,000 Units Subcutaneous Q12H  . levothyroxine  25 mcg Oral QAC breakfast  . metoprolol tartrate  12.5 mg Oral BID  . mirabegron ER  25 mg Oral Daily  . multivitamin with minerals   Oral Daily  . potassium chloride  10 mEq Oral QID  . rosuvastatin  10 mg Oral Daily  . sildenafil  20 mg Oral TID  . sodium chloride flush  3 mL Intravenous Q12H   Continuous Infusions: . sodium chloride     PRN Meds: sodium chloride, acetaminophen **OR** acetaminophen, albuterol, bisacodyl, hydrALAZINE, ondansetron **OR** ondansetron (ZOFRAN) IV, senna-docusate, sodium chloride flush, traMADol   Vital Signs    Vitals:   01/10/18 1715 01/10/18 1929 01/11/18 0432 01/11/18 0742  BP: (!) 113/54 (!) 131/56 134/73 134/71  Pulse: (!) 108 97 (!) 114 (!) 109  Resp:  18 16   Temp: 97.8 F (36.6 C) 97.9 F (36.6 C) 98 F (36.7 C)   TempSrc: Oral Oral Oral   SpO2: 90% 92% 92% 95%  Weight:   83 lb 3.2 oz (37.7 kg)   Height:        Intake/Output Summary (Last 24 hours) at 01/11/2018 0942 Last data filed at 01/11/2018 0905 Gross per 24 hour  Intake 240 ml  Output 1750 ml  Net -1510 ml   Filed Weights   01/09/18 0411 01/10/18 0511 01/11/18 0432  Weight: 88 lb 1.6 oz (40 kg) 82 lb 12.8 oz (37.6 kg) 83 lb 3.2 oz (37.7 kg)    Telemetry     - Personally Reviewed - nsr  ECG      Physical Exam   GEN: No acute distress. Frail appearing   Neck: 7 cm JVD Cardiac: RRR, no murmurs, rubs, or gallops.    Respiratory: Clear to auscultation bilaterally. GI: Soft, nontender, non-distended  MS: No edema; No deformity. Neuro:  Nonfocal  Psych: Normal affect   Labs    Chemistry Recent Labs  Lab 01/07/18 1042 01/08/18 0311 01/09/18 0125 01/10/18 0457  NA 133* 136 142 142  K 5.2* 3.6 3.6 3.9  CL 97* 102 100* 98*  CO2 27 31 35* 33*  GLUCOSE 104* 102* 101* 102*  BUN 52* 46* 35* 27*  CREATININE 0.97 0.82 0.72 0.55  CALCIUM 8.6* 7.9* 7.9* 8.5*  PROT 7.1 6.3*  --   --   ALBUMIN 3.3* 2.8*  --   --   AST 112* 93*  --   --   ALT 102* 88*  --   --   ALKPHOS 89 77  --   --   BILITOT 0.6 0.7  --   --   GFRNONAA 52* >60 >60 >60  GFRAA >60 >60 >60 >60  ANIONGAP 9 3* 7 11     Hematology Recent Labs  Lab 01/08/18 0311 01/09/18 0125 01/10/18 0457  WBC 6.0 6.7 6.1  RBC 4.32 4.22 4.58  HGB 13.4 13.3 14.3  HCT  42.3 40.5 44.3  MCV 97.7 95.9 96.6  MCH 31.0 31.5 31.2  MCHC 31.7* 32.8 32.3  RDW 14.9* 15.0* 14.9*  PLT 232 239 227    Cardiac Enzymes Recent Labs  Lab 01/07/18 1042 01/07/18 2105 01/08/18 0311 01/08/18 0654  TROPONINI 0.99* 1.63* 2.06* 1.84*   No results for input(s): TROPIPOC in the last 168 hours.   BNP Recent Labs  Lab 01/07/18 1042  BNP 3,070.0*     DDimer No results for input(s): DDIMER in the last 168 hours.   Radiology    No results found.  Cardiac Studies     Patient Profile     84 y.o. female history of chronic diastolic heart failure complicated by mitral valve prolapse/regurgitation, COPD, and GERDwho is being seen today for the evaluation ofshortness of breath and elevated troponinat the request of Dr. Benjie Karvonen.     Assessment & Plan    1  Acute resp failure   ENd stage lung dz   Pulmonary HTN wth cor pulmonale  Plan for home O2 and palliative care consult Diuresing. She is probably about as dry as we can get her.   2  Elevated troponin  Likely demand ischemia. No further workup indicated  3  CKD     4  Elevated LFTs probably  from passive congestion - a bad prognostic sign.   5. Disp. - she is stable for DC although her prognosis is very poor. Followup with Dr. Rockey Situ. She will require oxygen at discharge.   For questions or updates, please contact [place] Please consult [email or web address] for contact info under Cardiology/STEMI.      Signed, Cristopher Peru, MD  01/11/2018, 9:42 AM

## 2018-01-13 ENCOUNTER — Telehealth: Payer: Self-pay

## 2018-01-13 ENCOUNTER — Telehealth: Payer: Self-pay | Admitting: Family

## 2018-01-13 NOTE — Telephone Encounter (Signed)
Patient called wanting some advice regarding her low oxygen levels. She says that she was discharged from the hospital 01/11/18. She says that she was supposed to be discharged home on 6L of oxygen and then had a walk test done in the hospital prior to discharge with her oxygen removed and had room air sat of 93% so she didn't qualify for home oxygen.   She says that since she's been home, she's been unable to sleep flat in the bed. She says that her son bought a pulse oximeter and her sats have been running 60's-70's%. She says that right now, her room air sat is 88%.   She does not have an appointment with Dr. Raul Del who saw her in the hospital nor with the [facility] pulmonary HTN clinic since they were discharged over the weekend. Advised patient and her son that they needed to call Dr. Gust Brooms office to get an appointment scheduled ASAP and to give his staff the oxygen level numbers that they are obtaining as it sounds like she still needs the oxygen.  Both patient and son say they will call Dr. Gust Brooms office. She currently has an appointment scheduled with [place] 01/17/18.

## 2018-01-13 NOTE — Telephone Encounter (Signed)
I have made the 1st attempt to contact the patient or family member in charge, in order to follow up from recently being discharged from the hospital. I left a message on voicemail but I will make another attempt at a different time.   Direct call [phone]

## 2018-01-14 NOTE — Telephone Encounter (Signed)
Late entry  01/13/2018 4:20pm  I have made the 2nd attempt to contact the patient or family member in charge, in order to follow up from recently being discharged from the hospital. I left a message on voicemail but I will make another attempt at a different time.

## 2018-01-15 ENCOUNTER — Telehealth: Payer: Self-pay | Admitting: Family Medicine

## 2018-01-15 DIAGNOSIS — E7800 Pure hypercholesterolemia, unspecified: Secondary | ICD-10-CM

## 2018-01-15 DIAGNOSIS — I272 Pulmonary hypertension, unspecified: Secondary | ICD-10-CM

## 2018-01-15 NOTE — Telephone Encounter (Signed)
Pt was recently in hospital and was prescribed rosuvastatin and sildenafil and has questions about them [phone]

## 2018-01-16 MED ORDER — ROSUVASTATIN CALCIUM 10 MG PO TABS: 10.0000 mg | ORAL_TABLET | Freq: Every day | ORAL | 2 refills | Status: DC

## 2018-01-16 MED ORDER — SILDENAFIL CITRATE 20 MG PO TABS: 20.0000 mg | ORAL_TABLET | Freq: Three times a day (TID) | ORAL | 2 refills | Status: DC

## 2018-01-16 NOTE — Progress Notes (Signed)
Patient ID: Jennifer Skinner, female    DOB: [birth year], 83 y.o.   MRN: [id number]  HPI  Jennifer Skinner is a 84 y/o female who has a history of MV prolapse, COPD, HTN, GERD and HF with an EF of 60-65% who returns for follow-up today.    Echo report from 01/07/18 reviewed and showed an EF of 55-60% along with moderate/severe MR, moderate TR and severely elevated PA pressure of 70 mm Hg. Echo done 10/11/15 showed an EF of 60-65% with moderate mitral regurgitation but without aortic stenosis. Mild to moderate TR,  PASP 56mg  Hg.  Admitted 01/07/18 due to acute heart failure. Cardiology, pulmonology and palliative care consults were obtained. Oxygen at 6L was continued until at discharge. Needs pulmonary HTN follow-up and she was discharged after 4 days. Was in the ED 09/05/17 for UTI where she was treated and released.   Returns today for a follow-up visit with a chief complaint of moderate fatigue upon minimal exertion. She says this has been chronic in nature having been present for several years with varying levels of severity. She has associated shortness of breath (minimal with oxygen use), edema, dizziness, weakness and chronic back pain. She denies any chest pain, cough, palpitations, abdominal distention, weight gain or difficulty sleeping.    Past Medical History:  Diagnosis Date  . 1st degree AV block   . Arthritis   . Chronic diastolic CHF (congestive heart failure) ([place])    a. echo 2014: EF 55-60%, nl wall motion, GR1DD, mild MR, moder mitral prolapse, PASP 40 mm Hg; b. echo 09/2015: EF 60-65%, nl wall motion, GR1DD, mod MR, mild to mod TR, PASP 56 mm Hg  . COPD (chronic obstructive pulmonary disease) ([place])   . Degenerative disc disease, lumbar   . Gastro-esophageal reflux   . Mitral prolapse   . Mitral regurgitation   . Osteoporosis    Past Surgical History:  Procedure Laterality Date  . ABDOMINAL HYSTERECTOMY    . APPENDECTOMY    . DILATION AND CURETTAGE OF UTERUS    . HYSTEROTOMY    .  right breast biopsy    . right eye      surgery  . TONSILLECTOMY     Family History  Problem Relation Age of Onset  . CVA Mother 11  . Heart attack Father 72  . Throat cancer Sister   . CAD Brother    Social History   Tobacco Use  . Smoking status: Never Smoker  . Smokeless tobacco: Never Used  Substance Use Topics  . Alcohol use: No    Alcohol/week: 0.0 oz   Allergies  Allergen Reactions  . Penicillins     Rash    Prior to Admission medications   Medication Sig Start Date End Date Taking? Authorizing Provider  acetaminophen (TYLENOL) 500 MG tablet Take 500 mg by mouth every 6 (six) hours as needed.   Yes [provider]  albuterol (PROVENTIL) (2.5 MG/3ML) 0.083% nebulizer solution Take 3 mLs (2.5 mg total) by nebulization every 6 (six) hours as needed for wheezing or shortness of breath. 10/12/15  Yes Epifanio Lesches, MD  Apple Cider Vinegar (APPLE CIDER VINEGAR ULTRA) 600 MG CAPS 2 teaspoons once a day   Yes [provider]  aspirin 325 MG tablet Take 325 mg by mouth daily.   Yes [provider]  baclofen (LIORESAL) 10 MG tablet Take 0.5-1 tablets (5-10 mg total) by mouth 3 (three) times daily as needed for muscle spasms. 08/19/17  Yes Karamalegos, Devonne Doughty, DO  Calcium Gluconate 500 MG CAPS Take 2 capsules by mouth daily.    Yes [provider]  feeding supplement, ENSURE ENLIVE, (ENSURE ENLIVE) LIQD Take 237 mLs by mouth 3 (three) times daily between meals. 01/11/18  Yes Epifanio Lesches, MD  gabapentin (NEURONTIN) 100 MG capsule Take 1 capsule (100 mg total) by mouth 3 (three) times daily. 06/04/17  Yes Karamalegos, Devonne Doughty, DO  levothyroxine (SYNTHROID, LEVOTHROID) 25 MCG tablet Take 1 tablet (25 mcg total) by mouth daily before breakfast. Ran out of meds. 02/20/17  Yes Arlis Porta., MD  metoprolol tartrate (LOPRESSOR) 25 MG tablet Take 0.5 tablets (12.5 mg total) by mouth 2 (two) times daily. 03/12/17  Yes Karamalegos,  Devonne Doughty, DO  mirabegron ER (MYRBETRIQ) 25 MG TB24 tablet Take 1 tablet (25 mg total) by mouth daily. 09/05/17  Yes Karamalegos, Devonne Doughty, DO  Multiple Vitamin (MULTI-VITAMIN DAILY PO) Take 1 tablet by mouth daily.    Yes [provider]  [name] (OYSTER CALCIUM) 500 MG TABS Take 500 mg of elemental calcium by mouth 2 (two) times daily.   Yes [provider]  polyethylene glycol powder (GLYCOLAX/MIRALAX) powder Take 17-34 g by mouth daily as needed. 08/19/17  Yes Karamalegos, Devonne Doughty, DO  potassium chloride (K-DUR) 10 MEQ tablet Take 1 tablet (10 mEq total) by mouth 4 (four) times daily. 12/17/17  Yes Gollan, Kathlene November, MD  rosuvastatin (CRESTOR) 10 MG tablet Take 1 tablet (10 mg total) by mouth daily. 01/16/18  Yes Karamalegos, Devonne Doughty, DO  sildenafil (REVATIO) 20 MG tablet Take 1 tablet (20 mg total) by mouth 3 (three) times daily. For pulmonary hypertension 01/16/18  Yes Olin Hauser, DO  torsemide (DEMADEX) 20 MG tablet Take 2 tablets (40 mg total) 2 (two) times daily by mouth. 10/28/17 01/17/18 Yes Hackney, Aura Fey, FNP  traMADol (ULTRAM) 50 MG tablet Take 1 tablet by mouth every 6 (six) hours as needed for moderate pain.  08/23/14  Yes [provider]    Review of Systems  Constitutional: Positive for fatigue. Negative for appetite change.  HENT: Negative for congestion, postnasal drip and sore throat.   Eyes: Negative.   Respiratory: Positive for shortness of breath. Negative for cough and chest tightness.   Cardiovascular: Positive for leg swelling (better first thing in the mornings). Negative for chest pain and palpitations.  Gastrointestinal: Negative for abdominal distention and abdominal pain.  Endocrine: Negative.   Genitourinary: Negative.   Musculoskeletal: Positive for back pain. Negative for neck pain.  Skin: Negative.   Allergic/Immunologic: Negative.   Neurological: Positive for dizziness (at times) and weakness (in legs).  Negative for light-headedness.  Hematological: Negative for adenopathy. Does not bruise/bleed easily.  Psychiatric/Behavioral: Negative for dysphoric mood and sleep disturbance (sleeping on 1 pillow). The patient is not nervous/anxious.    Vitals:   01/17/18 1110  BP: 112/77  Pulse: 86  Resp: 18  SpO2: 97%  Weight: 89 lb 6 oz (40.5 kg)  Height: 4\' 9"  (1.448 m)   Wt Readings from Last 3 Encounters:  01/17/18 89 lb 6 oz (40.5 kg)  01/11/18 83 lb 3.2 oz (37.7 kg)  01/07/18 96 lb 8 oz (43.8 kg)   Lab Results  Component Value Date   CREATININE 0.55 01/10/2018   CREATININE 0.72 01/09/2018   CREATININE 0.82 01/08/2018    Physical Exam  Constitutional: She is oriented to person, place, and time. She appears well-developed and well-nourished.  HENT:  Head: Normocephalic and atraumatic.  Neck: Normal range of motion. Neck supple. No JVD present.  Cardiovascular: Normal rate and regular rhythm.  Pulmonary/Chest: Effort normal. She has no wheezes. She has no rales.  Abdominal: Soft. She exhibits no distension. There is no tenderness.  Musculoskeletal: She exhibits edema (1+ pitting edema in bilateral lower legs). She exhibits no tenderness.  Neurological: She is alert and oriented to person, place, and time.  Skin: Skin is warm and dry.  Psychiatric: She has a normal mood and affect. Thought content normal. She is slowed.  Nursing note and vitals reviewed.   Assessment & Plan:  1: Chronic heart failure with preserved ejection fraction- - NYHA Class III - continues to have swelling in her legs although they are better - Not adding salt to her food/reading food labels some - Continues to weigh daily. Reminded to call for an overnight weight gain of >2 pounds or weekly weight gain of >5 pounds - weight down 7 pounds from last visit - Saw cardiologist Rockey Situ) on 10/15/17 - BNP on 01/07/18 reviewed and was 3070.0  2: HTN- - BP looks good today - Saw PCP Parks Ranger) on 10/08/2017 &  returns April 2019 - BMP from 01/10/18 reviewed and showed sodium 142, potassium 3.9 and GFR >60  3: Lymphedema- - stage 2 - doesn't elevate her legs much and she was encouraged to elevate her legs when she's sitting for long periods - has not gotten the support socks yet and she was instructed to get a pair and put them on first thing in the morning with removal at bedtime - unable to exercise due to severe kyphosis and chronic back pain - discussed compression boots again but patient isn't very interested right now and wants to try the socks first  4: Pulmonary HTN- - recent diagnosis - awaiting pulmonary HTN clinic appointment - saw pulmonologist Raul Del) 01/13/18 & returns in ~1 month - has a prescription for sildenafil but pharmacy is waiting on a prior authorization - wearing oxygent at 2L around the clock and checking her oxygen levels at home  Patient did not bring her medications nor a list. Each medication was verbally reviewed with the patient and she was encouraged to bring the bottles to every visit to confirm accuracy of list.  Return in 3 months or sooner for any questions/problems before then.

## 2018-01-16 NOTE — Telephone Encounter (Signed)
Pt has question about this medication was Rx by hospital for short term does she need more refills. I advised that we will send to your pharmacy if you needed more refill.

## 2018-01-16 NOTE — Telephone Encounter (Signed)
Patient advised.

## 2018-01-16 NOTE — Telephone Encounter (Signed)
Reviewed chart from hospital. She has diagnosis of Pulmonary Hypertension. The rx for Sildenafil is indicated to help treat her breathing.  They started her on Sildenafil 20mg  3 times daily - and recommended to continue it. I have renewed this rx for 90 pills for a 1 month supply and refills to continue.  She should discuss this rx further with her Lung / Heart specialists outside the hospital.  I also re ordered the Rosuvastatin cholesterol medicine as requested.  Refills sent to [facility].  Nobie Putnam, [facility] 01/16/2018, 11:46 AM

## 2018-01-17 ENCOUNTER — Ambulatory Visit: Payer: Medicare Other | Attending: Family | Admitting: Family

## 2018-01-17 ENCOUNTER — Encounter: Payer: Self-pay | Admitting: Family

## 2018-01-17 VITALS — BP 112/77 | HR 86 | Resp 18 | Ht <= 58 in | Wt 89.4 lb

## 2018-01-17 DIAGNOSIS — I272 Pulmonary hypertension, unspecified: Secondary | ICD-10-CM | POA: Diagnosis not present

## 2018-01-17 DIAGNOSIS — G8929 Other chronic pain: Secondary | ICD-10-CM | POA: Diagnosis not present

## 2018-01-17 DIAGNOSIS — J449 Chronic obstructive pulmonary disease, unspecified: Secondary | ICD-10-CM | POA: Diagnosis not present

## 2018-01-17 DIAGNOSIS — Z880 Allergy status to penicillin: Secondary | ICD-10-CM | POA: Diagnosis not present

## 2018-01-17 DIAGNOSIS — K219 Gastro-esophageal reflux disease without esophagitis: Secondary | ICD-10-CM | POA: Insufficient documentation

## 2018-01-17 DIAGNOSIS — I890 Lymphedema, not elsewhere classified: Secondary | ICD-10-CM | POA: Insufficient documentation

## 2018-01-17 DIAGNOSIS — I5032 Chronic diastolic (congestive) heart failure: Secondary | ICD-10-CM | POA: Diagnosis not present

## 2018-01-17 DIAGNOSIS — I341 Nonrheumatic mitral (valve) prolapse: Secondary | ICD-10-CM | POA: Insufficient documentation

## 2018-01-17 DIAGNOSIS — Z79890 Hormone replacement therapy (postmenopausal): Secondary | ICD-10-CM | POA: Diagnosis not present

## 2018-01-17 DIAGNOSIS — I10 Essential (primary) hypertension: Secondary | ICD-10-CM

## 2018-01-17 DIAGNOSIS — M5136 Other intervertebral disc degeneration, lumbar region: Secondary | ICD-10-CM | POA: Insufficient documentation

## 2018-01-17 DIAGNOSIS — I440 Atrioventricular block, first degree: Secondary | ICD-10-CM | POA: Insufficient documentation

## 2018-01-17 DIAGNOSIS — Z7982 Long term (current) use of aspirin: Secondary | ICD-10-CM | POA: Insufficient documentation

## 2018-01-17 DIAGNOSIS — M810 Age-related osteoporosis without current pathological fracture: Secondary | ICD-10-CM | POA: Diagnosis not present

## 2018-01-17 DIAGNOSIS — Z79899 Other long term (current) drug therapy: Secondary | ICD-10-CM | POA: Insufficient documentation

## 2018-01-17 DIAGNOSIS — M40209 Unspecified kyphosis, site unspecified: Secondary | ICD-10-CM | POA: Diagnosis not present

## 2018-01-17 DIAGNOSIS — I110 Hypertensive heart disease with heart failure: Secondary | ICD-10-CM | POA: Diagnosis not present

## 2018-01-17 DIAGNOSIS — M7989 Other specified soft tissue disorders: Secondary | ICD-10-CM | POA: Diagnosis not present

## 2018-01-17 DIAGNOSIS — I340 Nonrheumatic mitral (valve) insufficiency: Secondary | ICD-10-CM | POA: Insufficient documentation

## 2018-01-17 DIAGNOSIS — M549 Dorsalgia, unspecified: Secondary | ICD-10-CM | POA: Insufficient documentation

## 2018-01-17 DIAGNOSIS — R5383 Other fatigue: Secondary | ICD-10-CM | POA: Diagnosis present

## 2018-01-17 NOTE — Patient Instructions (Signed)
Continue weighing daily and call for an overnight weight gain of > 2 pounds or a weekly weight gain of >5 pounds. 

## 2018-01-20 NOTE — Discharge Summary (Signed)
Jennifer Skinner, is a 84 y.o. female  DOB 1931/12/07  MRN [id number].  Admission date:  01/07/2018  Admitting Physician  Bettey Costa, MD  Discharge Date:  01/11/2018   Primary MD  Olin Hauser, DO  Recommendations for primary care physician for things to follow:  Follow-up with PCP in 1 week Follow-up with [facility] pulmonary hypertension clinic in 1-2 weeks.   Admission Diagnosis  Weakness [R53.1] Hypoxia [R09.02] Congestive heart failure, unspecified HF chronicity, unspecified heart failure type ([place]) [I50.9]   Discharge Diagnosis  Weakness [R53.1] Hypoxia [R09.02] Congestive heart failure, unspecified HF chronicity, unspecified heart failure type ([place]) [I50.9]    Principal Problem:   Pulmonary HTN ([place]) Active Problems:   MITRAL REGURGITATION   Interstitial lung disease (H[facility])   COPD (chronic obstructive pulmonary disease) (H[facility])   Acute diastolic heart failure ([facility])   Protein-calorie malnutrition, severe   Severe malnutrition ([place])      Past Medical History:  Diagnosis Date  . 1st degree AV block   . Arthritis   . Chronic diastolic CHF (congestive heart failure) ([place])    a. echo 2014: EF 55-60%, nl wall motion, GR1DD, mild MR, moder mitral prolapse, PASP 40 mm Hg; b. echo 09/2015: EF 60-65%, nl wall motion, GR1DD, mod MR, mild to mod TR, PASP 56 mm Hg  . COPD (chronic obstructive pulmonary disease) ([place])   . Degenerative disc disease, lumbar   . Gastro-esophageal reflux   . Hyperlipidemia   . Mitral prolapse   . Mitral regurgitation   . Osteoporosis     Past Surgical History:  Procedure Laterality Date  . ABDOMINAL HYSTERECTOMY    . APPENDECTOMY    . DILATION AND CURETTAGE OF UTERUS    . HYSTEROTOMY    . right breast biopsy    . right eye      surgery  . TONSILLECTOMY         History  of present illness and  Hospital Course:     Kindly see H&P for history of present illness and admission details, please review complete Labs, Consult reports and Test reports for all details in brief  HPI  from the history and physical done on the day of admission 84 year old female with history of chronic diastolic heart failure because of shortness of breath, generalized weakness, falling asleep easily for 1 week, when she came saturation 80% on 4 L.  Patient has not on oxygen at home.  Admitted to hospital for acute on chronic diastolic heart failure.   Hospital Course  Acute hypoxic respiratory failure secondary to acute on chronic diastolic heart failure, received several days of IV diuretics, discharged home with Lasix. 2.  End-stage lung disease with severe hypertension and cor pulmonale: Patient is to follow-up with hypertension clinic at [facility].  She wanted to go home on weekend as she felt better, recheck oxygen level on ambulation at room air and she did not qualify for oxygen because O2 sats stayed above 90%.  On ambulation on room air.  2.  Elevated troponins likely demand ischemia echocardiogram.  More than 55%.,  Severe hypertension..  Also has mitral valve prolapse, regurgitation.  #3.  Severe pulmonary hypertension: Started on revatio. 4.  Hypothyroidism: Continue Synthyroid 6.  Elevated liver function tests secondary to hepatic congestion. #7 severe malnutrition in the context of chronic illness'.  Seen by registered dietitian, started on  Ensure Enlive .  Discharge Condition:stable   Follow UP  Follow-up Information    [facility]  [place] Follow up on 01/17/2018.   Specialty:  Cardiology Why:  at 11:00am Contact information: [address] [place] [phone]            Discharge Instructions  and  Discharge Medications   Follow-up with [facility] pulmonary [facility] clinic.  PCP  can arrange that.  Patient eager to go home on weekend.   Allergies as of 01/11/2018      Reactions   Penicillins    Rash       Medication List    TAKE these medications   acetaminophen 500 MG tablet Commonly known as:  TYLENOL Take 500 mg by mouth every 6 (six) hours as needed.   albuterol (2.5 MG/3ML) 0.083% nebulizer solution Commonly known as:  PROVENTIL Take 3 mLs (2.5 mg total) by nebulization every 6 (six) hours as needed for wheezing or shortness of breath.   APPLE CIDER VINEGAR ULTRA 600 MG Caps Generic drug:  Apple Cider Vinegar 2 teaspoons once a day   aspirin 325 MG tablet Take 325 mg by mouth daily.   baclofen 10 MG tablet Commonly known as:  LIORESAL Take 0.5-1 tablets (5-10 mg total) by mouth 3 (three) times daily as needed for muscle spasms.   Calcium Gluconate 500 MG Caps Take 2 capsules by mouth daily.   feeding supplement (ENSURE ENLIVE) Liqd Take 237 mLs by mouth 3 (three) times daily between meals.   gabapentin 100 MG capsule Commonly known as:  NEURONTIN Take 1 capsule (100 mg total) by mouth 3 (three) times daily.   levothyroxine 25 MCG tablet Commonly known as:  SYNTHROID, LEVOTHROID Take 1 tablet (25 mcg total) by mouth daily before breakfast. Ran out of meds.   metoprolol tartrate 25 MG tablet Commonly known as:  LOPRESSOR Take 0.5 tablets (12.5 mg total) by mouth 2 (two) times daily.   mirabegron ER 25 MG Tb24 tablet Commonly known as:  MYRBETRIQ Take 1 tablet (25 mg total) by mouth daily.   MULTI-VITAMIN DAILY PO Take 1 tablet by mouth daily.   oyster calcium 500 MG Tabs tablet Take 500 mg of elemental calcium by mouth 2 (two) times daily.   polyethylene glycol powder powder Commonly known as:  GLYCOLAX/MIRALAX Take 17-34 g by mouth daily as needed.   potassium chloride 10 MEQ tablet Commonly known as:  K-DUR Take 1 tablet (10 mEq total) by mouth 4 (four) times daily.   torsemide 20 MG tablet Commonly known as:   DEMADEX Take 2 tablets (40 mg total) 2 (two) times daily by mouth.   traMADol 50 MG tablet Commonly known as:  ULTRAM Take 1 tablet by mouth every 6 (six) hours as needed for moderate pain.         Diet and Activity recommendation: See Discharge Instructions above   Consults obtained -cardiology, case manager   Major procedures and Radiology Reports - PLEASE review detailed and final reports for all details, in brief -     Dg Chest 1 View  Result Date: 01/07/2018 CLINICAL DATA:  Generalize weakness EXAM: CHEST 1 VIEW COMPARISON:  11/08/2015 FINDINGS: Small right pleural effusion. Diffuse bilateral interstitial thickening. No pneumothorax. Stable cardiomegaly. No acute osseous abnormality. IMPRESSION: Mild CHF. Electronically Signed   By: Kathreen Devoid   On: 01/07/2018 11:10   Ct Angio Chest Pe W Or Wo Contrast  Result Date: 01/08/2018 CLINICAL DATA:  Increasing lower extremity edema, shortness of breath and dyspnea on exertion. History of  chronic diastolic heart failure. Pulmonary edema on radiographs. EXAM: CT ANGIOGRAPHY CHEST WITH CONTRAST TECHNIQUE: Multidetector CT imaging of the chest was performed using the standard protocol during bolus administration of intravenous contrast. Multiplanar CT image reconstructions and MIPs were obtained to evaluate the vascular anatomy. CONTRAST:  29mL ISOVUE-370 IOPAMIDOL (ISOVUE-370) INJECTION 76% COMPARISON:  Chest radiograph 01/07/2018. Thoracic MRI 12/28/2013. Report only from chest CT 08/31/2003. FINDINGS: Cardiovascular: The pulmonary arteries are well opacified with contrast to the level of the subsegmental branches. There is no evidence of acute pulmonary embolism. There is central enlargement of the pulmonary arteries consistent with pulmonary arterial hypertension. There is limited enhancement of the systemic arteries. Mild aortic and coronary artery atherosclerosis. The heart is significantly enlarged. There are aortic valvular  calcifications. No pericardial effusion. Mediastinum/Nodes: Mildly enlarged mediastinal and hilar lymph nodes, including a 1.5 cm subcarinal node on image 39 of series 5 and a 1.4 cm right hilar node on image 45. The thyroid gland, trachea and esophagus demonstrate no significant findings. Lungs/Pleura: Small dependent right-greater-than-left pleural effusions. No pneumothorax. Mild emphysema. There is right lower lobe airspace disease with volume loss, probably atelectasis. Patchy airspace disease is also present anteriorly in the right upper lobe, in the right middle and left lower lobes. No discrete pulmonary nodules. Upper abdomen: Prominent reflux of contrast into the IVC and hepatic veins. Musculoskeletal/Chest wall: Paucity of subcutaneous and mediastinal fat. There is mild edema throughout the soft tissues. Superior endplate compression fractures at T6 and T11 are new from the 2015 thoracic MRI. T12 and L1 compression fractures are stable. Review of the MIP images confirms the above findings. IMPRESSION: 1. No evidence of acute pulmonary embolism. 2. Marked cardiomegaly with central enlargement of the pulmonary arteries consistent with pulmonary arterial hypertension. 3. Anasarca with generalized soft tissue edema and small bilateral pleural effusions. 4. Right-greater-than-left basilar pulmonary opacities, probably atelectasis. Radiographic follow up recommended. 5. Age-indeterminate T6 and T11 thoracic compression fractures, new from MRI of 2015. Electronically Signed   By: Richardean Sale M.D.   On: 01/08/2018 12:11    Micro Results    No results found for this or any previous visit (from the past 240 hour(s)).     Today   Subjective:   Jennifer Skinner today has  no, appears pale shortness of breath and wants to go home.    Objective:   Blood pressure 134/71, pulse (!) 109, temperature 98 F (36.7 C), temperature source Oral, resp. rate 16, height 4\' 9"  (1.448 m), weight 37.7 kg (83 lb  3.2 oz), SpO2 95 %.  No intake or output data in the 24 hours ending 01/20/18 2124  Exam Awake Alert, Oriented x 3, No new F.N deficits, Normal affect, appears very frail [place].AT,PERRAL Supple Neck,No JVD, No cervical lymphadenopathy appriciated.  Symmetrical Chest wall movement, Good air movement bilaterally, CTAB RRR,No Gallops,Rubs or new Murmurs, No Parasternal Heave +ve B.Sounds, Abd Soft, Non tender, No organomegaly appriciated, No rebound -guarding or rigidity. No Cyanosis, Clubbing or edema, No new Rash or bruise  Data Review   CBC w Diff:  Lab Results  Component Value Date   WBC 6.1 01/10/2018   HGB 14.3 01/10/2018   HGB 13.5 09/12/2013   HCT 44.3 01/10/2018   HCT 38.5 09/12/2013   PLT 227 01/10/2018   PLT 243 09/12/2013   LYMPHOPCT 11 01/07/2018   LYMPHOPCT 21.0 09/12/2013   MONOPCT 11 01/07/2018   MONOPCT 15.2 09/12/2013   EOSPCT 0 01/07/2018   EOSPCT 1.5 09/12/2013  BASOPCT 0 01/07/2018   BASOPCT 0.8 09/12/2013    CMP:  Lab Results  Component Value Date   NA 142 01/10/2018   NA 135 10/11/2016   NA 138 09/12/2013   K 3.9 01/10/2018   K 4.0 09/12/2013   CL 98 (L) 01/10/2018   CL 104 09/12/2013   CO2 33 (H) 01/10/2018   CO2 27 09/12/2013   BUN 27 (H) 01/10/2018   BUN 12 10/11/2016   BUN 15 09/12/2013   CREATININE 0.55 01/10/2018   CREATININE 0.53 (L) 02/21/2017   PROT 6.3 (L) 01/08/2018   ALBUMIN 2.8 (L) 01/08/2018   BILITOT 0.7 01/08/2018   ALKPHOS 77 01/08/2018   AST 93 (H) 01/08/2018   ALT 88 (H) 01/08/2018  .   Total Time in preparing paper work, data evaluation and todays exam - 35 minutes  Epifanio Lesches M.D on 01/11/2018 at 9:24 PM    Note: This dictation was prepared with Dragon dictation along with smaller phrase technology. Any transcriptional errors that result from this process are unintentional.

## 2018-01-23 ENCOUNTER — Telehealth: Payer: Self-pay | Admitting: Family Medicine

## 2018-01-23 ENCOUNTER — Telehealth: Payer: Self-pay | Admitting: Cardiovascular Disease

## 2018-01-23 NOTE — Telephone Encounter (Signed)
PCP has not changed doseage. PCP thinks cardiology needs to change medication from Sildenafil. Please call.

## 2018-01-23 NOTE — Telephone Encounter (Signed)
Pt PCP called regarding Sildenafil. PCP has changed the doseage

## 2018-01-23 NOTE — Telephone Encounter (Signed)
Called and spoke with Lucienne Minks  to confirm that this medication should be Rx by her cardiology since it was started by cardiology in the hospital per Dr. Parks Ranger. He attempted to Rx meds for short term but was denied. Denial paperwork for prior auth for sildenfil was faxed over to  [facility].

## 2018-01-23 NOTE — Telephone Encounter (Signed)
S/w Nisha at Dr Parks Ranger' office. Apparently, the prior authorization for Sildenafil was denied. She routed message to Dr Rockey Situ to notify him.  Awaiting response from Dr Rockey Situ to proceed with appeal or try a different medication. Documents on denial on Lars Pinks, RN's desk.

## 2018-01-24 NOTE — Telephone Encounter (Signed)
Spoke with Jennifer Skinner at PCP office regarding appeal process that will need to be done by them. Reviewed documentation that we received and advised that she should be covered for this medication. She reports that they will try to submit appeal process.

## 2018-01-24 NOTE — Telephone Encounter (Signed)
Attempted to call insurance to assist with denial but program requires physician who submitted prior authorization to submit appeal. Dr. Parks Ranger submitted initial prior authorization and then sent denial to our office. Will discuss with office administrator.

## 2018-01-24 NOTE — Telephone Encounter (Signed)
Spoke with insurance program to submit appeal and based on their records patient is no longer covered under plan. They stated she may be listed under different program and that was the reason for denial with recommendations to check with patient for updated information.

## 2018-01-26 NOTE — Telephone Encounter (Signed)
While we wait for appeal, It is 25$ for 90 pils at [employer] pay No other good substitute

## 2018-01-27 ENCOUNTER — Telehealth: Payer: Self-pay | Admitting: Family Medicine

## 2018-01-27 NOTE — Telephone Encounter (Signed)
Pt asked about getting a handicap placard.  Her call back number is [phone]

## 2018-01-27 NOTE — Telephone Encounter (Signed)
Requested handicap placard forms for both herself and husband. Forms completed 01/27/18. Ready for pick-up.  Nobie Putnam, [facility] 01/27/2018, 1:33 PM

## 2018-01-27 NOTE — Telephone Encounter (Signed)
Pt.notified

## 2018-02-06 ENCOUNTER — Telehealth: Payer: Self-pay

## 2018-02-06 NOTE — Telephone Encounter (Signed)
Member: Adayah, Arocho Member I.D [id number] Case number [id number] [facility] Auth # [id number]  Valid 01/24/2018 through 12/09/2018. Medication- Sildenafil   Left message for pt to notify approval.

## 2018-02-12 DIAGNOSIS — J449 Chronic obstructive pulmonary disease, unspecified: Secondary | ICD-10-CM | POA: Diagnosis not present

## 2018-02-12 DIAGNOSIS — J849 Interstitial pulmonary disease, unspecified: Secondary | ICD-10-CM | POA: Diagnosis not present

## 2018-02-12 DIAGNOSIS — R0609 Other forms of dyspnea: Secondary | ICD-10-CM | POA: Diagnosis not present

## 2018-02-12 DIAGNOSIS — Z9981 Dependence on supplemental oxygen: Secondary | ICD-10-CM | POA: Diagnosis not present

## 2018-03-03 ENCOUNTER — Other Ambulatory Visit: Payer: Self-pay

## 2018-03-03 DIAGNOSIS — E7800 Pure hypercholesterolemia, unspecified: Secondary | ICD-10-CM

## 2018-03-03 DIAGNOSIS — Z862 Personal history of diseases of the blood and blood-forming organs and certain disorders involving the immune mechanism: Secondary | ICD-10-CM

## 2018-03-03 DIAGNOSIS — R799 Abnormal finding of blood chemistry, unspecified: Secondary | ICD-10-CM

## 2018-03-03 DIAGNOSIS — E034 Atrophy of thyroid (acquired): Secondary | ICD-10-CM

## 2018-03-03 DIAGNOSIS — I5032 Chronic diastolic (congestive) heart failure: Secondary | ICD-10-CM

## 2018-03-03 DIAGNOSIS — I10 Essential (primary) hypertension: Secondary | ICD-10-CM

## 2018-03-04 ENCOUNTER — Ambulatory Visit (INDEPENDENT_AMBULATORY_CARE_PROVIDER_SITE_OTHER): Payer: PPO

## 2018-03-04 ENCOUNTER — Other Ambulatory Visit: Payer: PPO

## 2018-03-04 VITALS — BP 120/82 | HR 84 | Temp 97.8°F | Resp 16 | Ht <= 58 in | Wt 84.4 lb

## 2018-03-04 DIAGNOSIS — E034 Atrophy of thyroid (acquired): Secondary | ICD-10-CM | POA: Diagnosis not present

## 2018-03-04 DIAGNOSIS — Z0000 Encounter for general adult medical examination without abnormal findings: Secondary | ICD-10-CM | POA: Diagnosis not present

## 2018-03-04 DIAGNOSIS — E7800 Pure hypercholesterolemia, unspecified: Secondary | ICD-10-CM | POA: Diagnosis not present

## 2018-03-04 DIAGNOSIS — I10 Essential (primary) hypertension: Secondary | ICD-10-CM | POA: Diagnosis not present

## 2018-03-04 DIAGNOSIS — I5032 Chronic diastolic (congestive) heart failure: Secondary | ICD-10-CM | POA: Diagnosis not present

## 2018-03-04 NOTE — Patient Instructions (Addendum)
Jennifer Skinner , Thank you for taking time to come for your Medicare Wellness Visit. I appreciate your ongoing commitment to your health goals. Please review the following plan we discussed and let me know if I can assist you in the future.   Screening recommendations/referrals: Colonoscopy: no longer required Mammogram: no longer required Bone Density: no longer required Recommended yearly ophthalmology/optometry visit for glaucoma screening and checkup Recommended yearly dental visit for hygiene and checkup  Vaccinations: Influenza vaccine: up to date Pneumococcal vaccine: up to date Tdap vaccine: up to date Shingles vaccine: eligible, check with your insurance company for coverage    Advanced directives: Please bring a copy of your health care power of attorney and living will to the office at your convenience.  Conditions/risks identified: Follow recommended water intake from your cardiologist.   Next appointment: Follow up on 03/11/2018 at 9:00am with [place]. Follow up in one year for your annual wellness exam.    Preventive Care 82 Years and Older, Female Preventive care refers to lifestyle choices and visits with your health care provider that can promote health and wellness. What does preventive care include?  A yearly physical exam. This is also called an annual well check.  Dental exams once or twice a year.  Routine eye exams. Ask your health care provider how often you should have your eyes checked.  Personal lifestyle choices, including:  Daily care of your teeth and gums.  Regular physical activity.  Eating a healthy diet.  Avoiding tobacco and drug use.  Limiting alcohol use.  Practicing safe sex.  Taking low-dose aspirin every day.  Taking vitamin and mineral supplements as recommended by your health care provider. What happens during an annual well check? The services and screenings done by your health care provider during your annual well check  will depend on your age, overall health, lifestyle risk factors, and family history of disease. Counseling  Your health care provider may ask you questions about your:  Alcohol use.  Tobacco use.  Drug use.  Emotional well-being.  Home and relationship well-being.  Sexual activity.  Eating habits.  History of falls.  Memory and ability to understand (cognition).  Work and work Statistician.  Reproductive health. Screening  You may have the following tests or measurements:  Height, weight, and BMI.  Blood pressure.  Lipid and cholesterol levels. These may be checked every 5 years, or more frequently if you are over 71 years old.  Skin check.  Lung cancer screening. You may have this screening every year starting at age 82 if you have a 30-pack-year history of smoking and currently smoke or have quit within the past 15 years.  Fecal occult blood test (FOBT) of the stool. You may have this test every year starting at age 82.  Flexible sigmoidoscopy or colonoscopy. You may have a sigmoidoscopy every 5 years or a colonoscopy every 10 years starting at age 35.  Hepatitis C blood test.  Hepatitis B blood test.  Sexually transmitted disease (STD) testing.  Diabetes screening. This is done by checking your blood sugar (glucose) after you have not eaten for a while (fasting). You may have this done every 1-3 years.  Bone density scan. This is done to screen for osteoporosis. You may have this done starting at age 64.  Mammogram. This may be done every 1-2 years. Talk to your health care provider about how often you should have regular mammograms. Talk with your health care provider about your test results, treatment options,  and if necessary, the need for more tests. Vaccines  Your health care provider may recommend certain vaccines, such as:  Influenza vaccine. This is recommended every year.  Tetanus, diphtheria, and acellular pertussis (Tdap, Td) vaccine. You may  need a Td booster every 10 years.  Zoster vaccine. You may need this after age 74.  Pneumococcal 13-valent conjugate (PCV13) vaccine. One dose is recommended after age 82.  Pneumococcal polysaccharide (PPSV23) vaccine. One dose is recommended after age 40. Talk to your health care provider about which screenings and vaccines you need and how often you need them. This information is not intended to replace advice given to you by your health care provider. Make sure you discuss any questions you have with your health care provider. Document Released: 12/23/2015 Document Revised: 08/15/2016 Document Reviewed: 09/27/2015 Elsevier Interactive Patient Education  2017 [place] Prevention in the Home Falls can cause injuries. They can happen to people of all ages. There are many things you can do to make your home safe and to help prevent falls. What can I do on the outside of my home?  Regularly fix the edges of walkways and driveways and fix any cracks.  Remove anything that might make you trip as you walk through a door, such as a raised step or threshold.  Trim any bushes or trees on the path to your home.  Use bright outdoor lighting.  Clear any walking paths of anything that might make someone trip, such as rocks or tools.  Regularly check to see if handrails are loose or broken. Make sure that both sides of any steps have handrails.  Any raised decks and porches should have guardrails on the edges.  Have any leaves, snow, or ice cleared regularly.  Use sand or salt on walking paths during winter.  Clean up any spills in your garage right away. This includes oil or grease spills. What can I do in the bathroom?  Use night lights.  Install grab bars by the toilet and in the tub and shower. Do not use towel bars as grab bars.  Use non-skid mats or decals in the tub or shower.  If you need to sit down in the shower, use a plastic, non-slip stool.  Keep the floor  dry. Clean up any water that spills on the floor as soon as it happens.  Remove soap buildup in the tub or shower regularly.  Attach bath mats securely with double-sided non-slip rug tape.  Do not have throw rugs and other things on the floor that can make you trip. What can I do in the bedroom?  Use night lights.  Make sure that you have a light by your bed that is easy to reach.  Do not use any sheets or blankets that are too big for your bed. They should not hang down onto the floor.  Have a firm chair that has side arms. You can use this for support while you get dressed.  Do not have throw rugs and other things on the floor that can make you trip. What can I do in the kitchen?  Clean up any spills right away.  Avoid walking on wet floors.  Keep items that you use a lot in easy-to-reach places.  If you need to reach something above you, use a strong step stool that has a grab bar.  Keep electrical cords out of the way.  Do not use floor polish or wax that makes floors slippery. If  you must use wax, use non-skid floor wax.  Do not have throw rugs and other things on the floor that can make you trip. What can I do with my stairs?  Do not leave any items on the stairs.  Make sure that there are handrails on both sides of the stairs and use them. Fix handrails that are broken or loose. Make sure that handrails are as long as the stairways.  Check any carpeting to make sure that it is firmly attached to the stairs. Fix any carpet that is loose or worn.  Avoid having throw rugs at the top or bottom of the stairs. If you do have throw rugs, attach them to the floor with carpet tape.  Make sure that you have a light switch at the top of the stairs and the bottom of the stairs. If you do not have them, ask someone to add them for you. What else can I do to help prevent falls?  Wear shoes that:  Do not have high heels.  Have rubber bottoms.  Are comfortable and fit you  well.  Are closed at the toe. Do not wear sandals.  If you use a stepladder:  Make sure that it is fully opened. Do not climb a closed stepladder.  Make sure that both sides of the stepladder are locked into place.  Ask someone to hold it for you, if possible.  Clearly mark and make sure that you can see:  Any grab bars or handrails.  First and last steps.  Where the edge of each step is.  Use tools that help you move around (mobility aids) if they are needed. These include:  Canes.  Walkers.  Scooters.  Crutches.  Turn on the lights when you go into a dark area. Replace any light bulbs as soon as they burn out.  Set up your furniture so you have a clear path. Avoid moving your furniture around.  If any of your floors are uneven, fix them.  If there are any pets around you, be aware of where they are.  Review your medicines with your doctor. Some medicines can make you feel dizzy. This can increase your chance of falling. Ask your doctor what other things that you can do to help prevent falls. This information is not intended to replace advice given to you by your health care provider. Make sure you discuss any questions you have with your health care provider. Document Released: 09/22/2009 Document Revised: 05/03/2016 Document Reviewed: 12/31/2014 Elsevier Interactive Patient Education  2017 [employer].

## 2018-03-04 NOTE — Progress Notes (Signed)
Subjective:   Jennifer Skinner is a 84 y.o. female who presents for Medicare Annual (Subsequent) preventive examination.  Review of Systems:  Cardiac Risk Factors include: hypertension;advanced age (>57men, >59 women);dyslipidemia     Objective:     Vitals: BP 120/82 (BP Location: Left Arm, Patient Position: Sitting)   Pulse 84   Temp 97.8 F (36.6 C) (Oral)   Resp 16   Ht 4\' 9"  (1.448 m)   Wt 84 lb 6.4 oz (38.3 kg)   SpO2 91%   BMI 18.26 kg/m   Body mass index is 18.26 kg/m.  Advanced Directives 01/07/2018 09/05/2017 09/02/2017 08/26/2017 03/19/2017 03/04/2017 09/04/2016  Does Patient Have a Medical Advance Directive? No No Yes Yes Yes No Yes  Type of Advance Directive - Public librarian;Living will [place];Living will [place];Living will - Living will;Healthcare Power of Attorney  Does patient want to make changes to medical advance directive? - - - - - - No - Patient declined  Copy of [place] in Chart? - - - - - - No - copy requested  Would patient like information on creating a medical advance directive? No - Patient declined - - - - - -    Tobacco Social History   Tobacco Use  Smoking Status Never Smoker  Smokeless Tobacco Never Used     Counseling given: Not Answered   Clinical Intake:  Pre-visit preparation completed: Yes  Pain : No/denies pain     Nutritional Status: BMI <19  Underweight Nutritional Risks: None Diabetes: No  How often do you need to have someone help you when you read instructions, pamphlets, or other written materials from your doctor or pharmacy?: 1 - Never What is the last grade level you completed in school?: 12th grade  Interpreter Needed?: No  Information entered by :: Leviathan Macera,LPN   Past Medical History:  Diagnosis Date  . 1st degree AV block   . Arthritis   . Chronic diastolic CHF (congestive heart failure) ([place])    a. echo 2014: EF 55-60%, nl  wall motion, GR1DD, mild MR, moder mitral prolapse, PASP 40 mm Hg; b. echo 09/2015: EF 60-65%, nl wall motion, GR1DD, mod MR, mild to mod TR, PASP 56 mm Hg  . COPD (chronic obstructive pulmonary disease) ([place])   . Degenerative disc disease, lumbar   . Gastro-esophageal reflux   . Hyperlipidemia   . Mitral prolapse   . Mitral regurgitation   . Osteoporosis    Past Surgical History:  Procedure Laterality Date  . ABDOMINAL HYSTERECTOMY    . APPENDECTOMY    . DILATION AND CURETTAGE OF UTERUS    . HYSTEROTOMY    . right breast biopsy    . right eye      surgery  . TONSILLECTOMY     Family History  Problem Relation Age of Onset  . CVA Mother 36  . Heart attack Father 59  . Throat cancer Sister   . CAD Brother    Social History   Socioeconomic History  . Marital status: Married    Spouse name: Not on file  . Number of children: Not on file  . Years of education: Not on file  . Highest education level: Not on file  Occupational History  . Not on file  Social Needs  . Financial resource strain: Not hard at all  . Food insecurity:    Worry: Never true    Inability: Never  true  . Transportation needs:    Medical: No    Non-medical: No  Tobacco Use  . Smoking status: Never Smoker  . Smokeless tobacco: Never Used  Substance and Sexual Activity  . Alcohol use: No    Alcohol/week: 0.0 oz  . Drug use: No  . Sexual activity: Never  Lifestyle  . Physical activity:    Days per week: 0 days    Minutes per session: 0 min  . Stress: Not at all  Relationships  . Social connections:    Talks on phone: Twice a week    Gets together: Never    Attends religious service: More than 4 times per year    Active member of club or organization: No    Attends meetings of clubs or organizations: Never    Relationship status: Married  Other Topics Concern  . Not on file  Social History Narrative  . Not on file    Outpatient Encounter Medications as of 03/04/2018  Medication Sig  .  acetaminophen (TYLENOL) 500 MG tablet Take 500 mg by mouth every 6 (six) hours as needed.  Marland Kitchen albuterol (PROVENTIL) (2.5 MG/3ML) 0.083% nebulizer solution Take 3 mLs (2.5 mg total) by nebulization every 6 (six) hours as needed for wheezing or shortness of breath.  Marland Kitchen aspirin 325 MG tablet Take 325 mg by mouth daily.  . baclofen (LIORESAL) 10 MG tablet Take 0.5-1 tablets (5-10 mg total) by mouth 3 (three) times daily as needed for muscle spasms.  . Calcium Gluconate 500 MG CAPS Take 2 capsules by mouth daily.   Marland Kitchen gabapentin (NEURONTIN) 100 MG capsule Take 1 capsule (100 mg total) by mouth 3 (three) times daily.  Marland Kitchen levothyroxine (SYNTHROID, LEVOTHROID) 25 MCG tablet Take 1 tablet (25 mcg total) by mouth daily before breakfast. Ran out of meds.  . metoprolol tartrate (LOPRESSOR) 25 MG tablet Take 0.5 tablets (12.5 mg total) by mouth 2 (two) times daily.  . mirabegron ER (MYRBETRIQ) 25 MG TB24 tablet Take 1 tablet (25 mg total) by mouth daily.  . Multiple Vitamin (MULTI-VITAMIN DAILY PO) Take 1 tablet by mouth daily.   [name] (OYSTER CALCIUM) 500 MG TABS Take 500 mg of elemental calcium by mouth 2 (two) times daily.  . polyethylene glycol powder (GLYCOLAX/MIRALAX) powder Take 17-34 g by mouth daily as needed.  . potassium chloride (K-DUR) 10 MEQ tablet Take 1 tablet (10 mEq total) by mouth 4 (four) times daily.  . sildenafil (REVATIO) 20 MG tablet Take 1 tablet (20 mg total) by mouth 3 (three) times daily. For pulmonary hypertension  . traMADol (ULTRAM) 50 MG tablet Take 1 tablet by mouth every 6 (six) hours as needed for moderate pain.   . feeding supplement, ENSURE ENLIVE, (ENSURE ENLIVE) LIQD Take 237 mLs by mouth 3 (three) times daily between meals. (Patient not taking: Reported on 03/04/2018)  . rosuvastatin (CRESTOR) 10 MG tablet Take 1 tablet (10 mg total) by mouth daily.  Marland Kitchen torsemide (DEMADEX) 20 MG tablet Take 2 tablets (40 mg total) 2 (two) times daily by mouth.  . [DISCONTINUED] Apple  Cider Vinegar (APPLE CIDER VINEGAR ULTRA) 600 MG CAPS 2 teaspoons once a day   No facility-administered encounter medications on file as of 03/04/2018.     Activities of Daily Living In your present state of health, do you have any difficulty performing the following activities: 03/04/2018 01/17/2018  Hearing? Y N  Vision? Y N  Difficulty concentrating or making decisions? N N  Walking or  climbing stairs? N Y  Dressing or bathing? N Y  Doing errands, shopping? Tempie Donning  Comment husband transports -  Conservation officer, nature and eating ? N -  Using the Toilet? N -  In the past six months, have you accidently leaked urine? Y -  Comment wears pads -  Do you have problems with loss of bowel control? N -  Managing your Medications? N -  Managing your Finances? N -  Housekeeping or managing your Housekeeping? N -  Some recent data might be hidden    Patient Care Team: Olin Hauser, DO as PCP - General (Family Medicine) Alisa Graff, FNP as Nurse Practitioner (Cardiology) Minna Merritts, MD as Consulting Physician (Cardiology)    Assessment:   This is a routine wellness examination for Giani.  Exercise Activities and Dietary recommendations Current Exercise Habits: The patient does not participate in regular exercise at present, Exercise limited by: None identified  Goals    None      Fall Risk Fall Risk  03/04/2018 01/17/2018 01/07/2018 11/27/2017 10/28/2017  Falls in the past year? No No No No No   Is the patient's home free of loose throw rugs in walkways, pet beds, electrical cords, etc?   yes      Grab bars in the bathroom? yes      Handrails on the stairs?   no stairs       Adequate lighting?   yes  Timed Get Up and Go performed: Completed in 9 seconds with no use of assistive devices, steady gait. No intervention needed at this time.   Depression Screen PHQ 2/9 Scores 03/04/2018 01/17/2018 01/07/2018 11/27/2017  PHQ - 2 Score 0 1 0 0     Cognitive Function       6CIT Screen 03/04/2018  What Year? 0 points  What month? 0 points  What time? 0 points  Count back from 20 0 points  Months in reverse 0 points  Repeat phrase 2 points  Total Score 2    Immunization History  Administered Date(s) Administered  . Influenza, High Dose Seasonal PF 09/27/2015, 09/25/2016  . Influenza-Unspecified 09/30/2012, 09/25/2017  . Pneumococcal Conjugate-13 05/18/2014  . Pneumococcal Polysaccharide-23 09/30/2012    Qualifies for Shingles Vaccine?yes, discussed shingrix vaccine   Screening Tests Health Maintenance  Topic Date Due  . TETANUS/TDAP  10/16/2025  . INFLUENZA VACCINE  Completed  . DEXA SCAN  Completed  . PNA vac Low Risk Adult  Completed    Cancer Screenings: Lung: Low Dose CT Chest recommended if Age 17-80 years, 30 pack-year currently smoking OR have quit w/in 15years. Patient does not qualify. Breast:  Up to date on Mammogram? Yes  No longer required Up to date of Bone Density/Dexa? Yes no longer required Colorectal: no longer required  Additional Screenings:  Hepatitis B/HIV/Syphillis:not indicated Hepatitis C Screening: not indicated      Plan:    I have personally reviewed and addressed the Medicare Annual Wellness questionnaire and have noted the following in the patient's chart:  A. Medical and social history B. Use of alcohol, tobacco or illicit drugs  C. Current medications and supplements D. Functional ability and status E.  Nutritional status F.  Physical activity G. Advance directives H. List of other physicians I.  Hospitalizations, surgeries, and ER visits in previous 12 months J.  [place] such as hearing and vision if needed, cognitive and depression L. Referrals and appointments   In addition, I have reviewed and  discussed with patient certain preventive protocols, quality metrics, and best practice recommendations. A written personalized care plan for preventive services as well as general preventive  health recommendations were provided to patient.   Signed,  Tyler Aas, LPN Nurse Health Advisor   Nurse Notes:none

## 2018-03-05 LAB — CBC WITH DIFFERENTIAL/PLATELET
BASOS PCT: 0.7 %
Basophils Absolute: 28 cells/uL (ref 0–200)
EOS PCT: 2 %
Eosinophils Absolute: 80 cells/uL (ref 15–500)
HEMATOCRIT: 38.2 % (ref 35.0–45.0)
HEMOGLOBIN: 13.1 g/dL (ref 11.7–15.5)
LYMPHS ABS: 636 {cells}/uL — AB (ref 850–3900)
MCH: 31.1 pg (ref 27.0–33.0)
MCHC: 34.3 g/dL (ref 32.0–36.0)
MCV: 90.7 fL (ref 80.0–100.0)
MPV: 10.2 fL (ref 7.5–12.5)
Monocytes Relative: 10 %
NEUTROS ABS: 2856 {cells}/uL (ref 1500–7800)
Neutrophils Relative %: 71.4 %
Platelets: 218 10*3/uL (ref 140–400)
RBC: 4.21 10*6/uL (ref 3.80–5.10)
RDW: 14.8 % (ref 11.0–15.0)
Total Lymphocyte: 15.9 %
WBC mixed population: 400 cells/uL (ref 200–950)
WBC: 4 10*3/uL (ref 3.8–10.8)

## 2018-03-05 LAB — COMPLETE METABOLIC PANEL WITH GFR
AG RATIO: 1.1 (calc) (ref 1.0–2.5)
ALKALINE PHOSPHATASE (APISO): 72 U/L (ref 33–130)
ALT: 29 U/L (ref 6–29)
AST: 32 U/L (ref 10–35)
Albumin: 3.7 g/dL (ref 3.6–5.1)
BILIRUBIN TOTAL: 0.5 mg/dL (ref 0.2–1.2)
BUN/Creatinine Ratio: 40 (calc) — ABNORMAL HIGH (ref 6–22)
BUN: 27 mg/dL — ABNORMAL HIGH (ref 7–25)
CHLORIDE: 97 mmol/L — AB (ref 98–110)
CO2: 31 mmol/L (ref 20–32)
Calcium: 9 mg/dL (ref 8.6–10.4)
Creat: 0.67 mg/dL (ref 0.60–0.88)
GFR, EST AFRICAN AMERICAN: 94 mL/min/{1.73_m2} (ref >=60)
GFR, Est Non African American: 81 mL/min/{1.73_m2} (ref >=60)
Globulin: 3.4 g/dL (calc) (ref 1.9–3.7)
Glucose, Bld: 87 mg/dL (ref 65–99)
POTASSIUM: 4 mmol/L (ref 3.5–5.3)
Sodium: 139 mmol/L (ref 135–146)
TOTAL PROTEIN: 7.1 g/dL (ref 6.1–8.1)

## 2018-03-05 LAB — LIPID PANEL
CHOL/HDL RATIO: 3.5 (calc) (ref <5.0)
Cholesterol: 202 mg/dL — ABNORMAL HIGH (ref <200)
HDL: 58 mg/dL (ref >50)
LDL CHOLESTEROL (CALC): 125 mg/dL — AB
Non-HDL Cholesterol (Calc): 144 mg/dL (calc) — ABNORMAL HIGH (ref <130)
TRIGLYCERIDES: 91 mg/dL (ref <150)

## 2018-03-05 LAB — HEMOGLOBIN A1C
Hgb A1c MFr Bld: 6.2 % of total Hgb — ABNORMAL HIGH (ref <5.7)
Mean Plasma Glucose: 131 (calc)
eAG (mmol/L): 7.3 (calc)

## 2018-03-05 LAB — TSH: TSH: 2.93 m[IU]/L (ref 0.40–4.50)

## 2018-03-05 LAB — T4, FREE: FREE T4: 1 ng/dL (ref 0.8–1.8)

## 2018-03-07 ENCOUNTER — Encounter: Payer: Self-pay | Admitting: Family Medicine

## 2018-03-07 DIAGNOSIS — R7309 Other abnormal glucose: Secondary | ICD-10-CM | POA: Insufficient documentation

## 2018-03-11 ENCOUNTER — Encounter: Payer: PPO | Admitting: Family Medicine

## 2018-03-11 ENCOUNTER — Other Ambulatory Visit: Payer: Self-pay

## 2018-03-11 ENCOUNTER — Ambulatory Visit (INDEPENDENT_AMBULATORY_CARE_PROVIDER_SITE_OTHER): Payer: PPO | Admitting: Family Medicine

## 2018-03-11 ENCOUNTER — Encounter: Payer: Self-pay | Admitting: Family Medicine

## 2018-03-11 VITALS — BP 84/45 | HR 85 | Temp 97.8°F | Resp 16 | Ht <= 58 in | Wt 86.0 lb

## 2018-03-11 DIAGNOSIS — E034 Atrophy of thyroid (acquired): Secondary | ICD-10-CM | POA: Diagnosis not present

## 2018-03-11 DIAGNOSIS — J410 Simple chronic bronchitis: Secondary | ICD-10-CM

## 2018-03-11 DIAGNOSIS — E7800 Pure hypercholesterolemia, unspecified: Secondary | ICD-10-CM | POA: Diagnosis not present

## 2018-03-11 DIAGNOSIS — Z0000 Encounter for general adult medical examination without abnormal findings: Secondary | ICD-10-CM

## 2018-03-11 DIAGNOSIS — I5032 Chronic diastolic (congestive) heart failure: Secondary | ICD-10-CM | POA: Diagnosis not present

## 2018-03-11 DIAGNOSIS — Z0001 Encounter for general adult medical examination with abnormal findings: Secondary | ICD-10-CM | POA: Diagnosis not present

## 2018-03-11 DIAGNOSIS — R3915 Urgency of urination: Secondary | ICD-10-CM | POA: Diagnosis not present

## 2018-03-11 DIAGNOSIS — I10 Essential (primary) hypertension: Secondary | ICD-10-CM | POA: Diagnosis not present

## 2018-03-11 DIAGNOSIS — N3281 Overactive bladder: Secondary | ICD-10-CM | POA: Diagnosis not present

## 2018-03-11 DIAGNOSIS — R7309 Other abnormal glucose: Secondary | ICD-10-CM

## 2018-03-11 DIAGNOSIS — E43 Unspecified severe protein-calorie malnutrition: Secondary | ICD-10-CM

## 2018-03-11 MED ORDER — MIRABEGRON ER 25 MG PO TB24: 25.0000 mg | ORAL_TABLET | Freq: Every day | ORAL | 2 refills | Status: DC

## 2018-03-11 MED ORDER — ROSUVASTATIN CALCIUM 10 MG PO TABS: 10.0000 mg | ORAL_TABLET | Freq: Every day | ORAL | 3 refills | Status: DC

## 2018-03-11 MED ORDER — LEVOTHYROXINE SODIUM 25 MCG PO TABS: 25.0000 ug | ORAL_TABLET | Freq: Every day | ORAL | 3 refills | Status: DC

## 2018-03-11 NOTE — Assessment & Plan Note (Signed)
Improved cholesterol on statin, however may not be taking Last lipid panel 02/2018  Plan: 1. Continue current meds - Re ordered Rosuvastatin 10mg  daily

## 2018-03-11 NOTE — Assessment & Plan Note (Signed)
Stable without flare Followed by [facility] Dr Raul Del On 24 hour O2 supplemental No maintenance inhaler, rarely using Albuterol, will get refill from [facility]

## 2018-03-11 NOTE — Assessment & Plan Note (Addendum)
Clinically consistent with muscle wasting atrophy low wt, recent wt loss 2.5 lbs in 2 weeks, and overall down 3-4 lbs in 2 months - Recent lab low albumin 2.8 down from 3.3 - Emphasized importance of inc protein in diet, may try OTC supplement ensure, boost, but she does not like taste, emphasize more protein in regular diet then,follow-up as planned

## 2018-03-11 NOTE — Assessment & Plan Note (Signed)
Stable well controlled Last TSH Free T4 normal Continue current low dose Levothyroxine 49mcg daily, refilled

## 2018-03-11 NOTE — Assessment & Plan Note (Signed)
Mild low BP today, asymptomatic Home monitoring BP normal Continue current meds, follow-up with Cardiology closely

## 2018-03-11 NOTE — Assessment & Plan Note (Addendum)
Stable without flare in interval Followed by [facility] Continue current med regimen with diuretic, use caution given low BP and weight Monitor weight closely

## 2018-03-11 NOTE — Patient Instructions (Addendum)
Thank you for coming to the office today.  Re-ordered Myrbetriq (Mirabegron) 25mg  tablet once daily - 24 hour pill, this will help control some of your bladder symptoms, it will not cure them as like you mention there is some bladder dysfunction as well. If needed next step is referral to urologist for further evaluation.  Sent new rx back to pharmacy for cholesterol pill - Rosuvastatin 10mg  once daily (if prefer can take at bedtime)  Increase protein in diet, concern with low Albumin and low protein - try to improve this if not taking Ensure, may try other supplement OTC Boost etc. Otherwise try to inc protein in diet as discussed meats, chicken fish, greek yogurt, nuts and peanut butter  Please schedule a Follow-up Appointment to: Return in about 3 months (around 06/10/2018) for Bladder OAB med adjust, Weight / Protein.  If you have any other questions or concerns, please feel free to call the office or send a message through [place]. You may also schedule an earlier appointment if necessary.  Additionally, you may be receiving a survey about your experience at our office within a few days to 1 week by e-mail or mail. We value your feedback.  Nobie Putnam, DO [place]

## 2018-03-11 NOTE — Progress Notes (Signed)
Subjective:    Patient ID: Jennifer Skinner, female    DOB: [birth year], 84 y.o.   MRN: [id number]  Jennifer Skinner is a 84 y.o. female presenting on 03/11/2018 for Annual Exam (trouble urinating for the last 2 months)  Patient accompanied by her husband, Jennifer Skinner.  HPI   CHRONIC HTN / Chronic Diastolic CHF / Mitral Valve Regurgitation - Last visit with me discussed these concerns 09/2017 and has seen Cardiology in 01/2018, for same problem and continues to follow-up with [facility] Cardiology regularly at [facility] see prior notes for background information. - No recent flares - Today patient reports doing well, she is monitoring home weight twice daily - She has been switched from Lasix to Torsemide per Cardiology - Continues on current meds, see list - has not increased diuretic or other changes  COPD, Chronic Respiratory Failure Followed by [facility] Pulmonology Dr Raul Del, she continues on supplemental oxygen 24 hours. Has Albuterol nebulizer at home PRN.  Mixed urinary symptoms / Presumed OAB Similar history to previous issue discussed on phone back in 08/2017, she had prior UTI and concern infection, and she was also reported additional symptoms of urgency, concern for OAB. She was started on trial of Myrbetriq 25mg  daily, she does not recall taking this med or if it helped, she has not seen Urologist and would like to restart med first before referral  CHRONIC HTN Normal BP at home, monitoring at home. Current Meds - Metoprolol 12.5mg  (Half of 25mg  tab) BID, Torsemide 20mg  BID - Taking Potassium 88mEq 4 times daily Reports good compliance, took meds today. Tolerating well, w/o complaints. Lifestyle - Limited exercise - Taking Aspirin 325mg  daily, recommended by eye doctor at [place] ECHO 10/2015 with EF 60-65%, moderate mitral regurg without aortic stenosis  Protein Calorie Malnutrition, severe She stopped taking Ensure, does not like taste. Weight down  2.5 lbs, has had issue with low weight for while. She weighs twice daily every day, appetite is good. Last lab 01/08/18 with low albumin 2.8  Health Maintenance: - UTD Flu Vaccine 09/25/17 - UTD Pneumonia vaccine - UTD TDap, DEXA   Depression screen [facility] 2/9 03/11/2018 03/04/2018 01/17/2018  Decreased Interest 0 0 0  Down, Depressed, Hopeless 0 0 1  PHQ - 2 Score 0 0 1  Altered sleeping 0 - -  Tired, decreased energy 0 - -  Change in appetite 0 - -  Feeling bad or failure about yourself  0 - -  Trouble concentrating 0 - -  Moving slowly or fidgety/restless 0 - -  Suicidal thoughts 0 - -  PHQ-9 Score 0 - -  Difficult doing work/chores Not difficult at all - -    Past Medical History:  Diagnosis Date  . 1st degree AV block   . Arthritis   . Chronic diastolic CHF (congestive heart failure) ([place])    a. echo 2014: EF 55-60%, nl wall motion, GR1DD, mild MR, moder mitral prolapse, PASP 40 mm Hg; b. echo 09/2015: EF 60-65%, nl wall motion, GR1DD, mod MR, mild to mod TR, PASP 56 mm Hg  . COPD (chronic obstructive pulmonary disease) ([place])   . Degenerative disc disease, lumbar   . Gastro-esophageal reflux   . Hyperlipidemia   . Mitral prolapse   . Mitral regurgitation   . Osteoporosis    Past Surgical History:  Procedure Laterality Date  . ABDOMINAL HYSTERECTOMY    . APPENDECTOMY    . DILATION AND CURETTAGE OF UTERUS    .  HYSTEROTOMY    . right breast biopsy    . right eye      surgery  . TONSILLECTOMY     Social History   Socioeconomic History  . Marital status: Married    Spouse name: Not on file  . Number of children: Not on file  . Years of education: Not on file  . Highest education level: Not on file  Occupational History  . Not on file  Social Needs  . Financial resource strain: Not hard at all  . Food insecurity:    Worry: Never true    Inability: Never true  . Transportation needs:    Medical: No    Non-medical: No  Tobacco Use  . Smoking status: Never Smoker   . Smokeless tobacco: Never Used  Substance and Sexual Activity  . Alcohol use: No    Alcohol/week: 0.0 oz  . Drug use: No  . Sexual activity: Never  Lifestyle  . Physical activity:    Days per week: 0 days    Minutes per session: 0 min  . Stress: Not at all  Relationships  . Social connections:    Talks on phone: Twice a week    Gets together: Never    Attends religious service: More than 4 times per year    Active member of club or organization: No    Attends meetings of clubs or organizations: Never    Relationship status: Married  . Intimate partner violence:    Fear of current or ex partner: No    Emotionally abused: No    Physically abused: No    Forced sexual activity: No  Other Topics Concern  . Not on file  Social History Narrative  . Not on file   Family History  Problem Relation Age of Onset  . CVA Mother 76  . Heart attack Father 13  . Throat cancer Sister   . CAD Brother    Current Outpatient Medications on File Prior to Visit  Medication Sig  . acetaminophen (TYLENOL) 500 MG tablet Take 500 mg by mouth every 6 (six) hours as needed.  Marland Kitchen albuterol (PROVENTIL) (2.5 MG/3ML) 0.083% nebulizer solution Take 3 mLs (2.5 mg total) by nebulization every 6 (six) hours as needed for wheezing or shortness of breath.  Marland Kitchen aspirin 325 MG tablet Take 325 mg by mouth daily.  . baclofen (LIORESAL) 10 MG tablet Take 0.5-1 tablets (5-10 mg total) by mouth 3 (three) times daily as needed for muscle spasms.  . Calcium Gluconate 500 MG CAPS Take 2 capsules by mouth daily.   Marland Kitchen gabapentin (NEURONTIN) 100 MG capsule Take 1 capsule (100 mg total) by mouth 3 (three) times daily.  . metoprolol tartrate (LOPRESSOR) 25 MG tablet Take 0.5 tablets (12.5 mg total) by mouth 2 (two) times daily.  . Multiple Vitamin (MULTI-VITAMIN DAILY PO) Take 1 tablet by mouth daily.   [name] (OYSTER CALCIUM) 500 MG TABS Take 500 mg of elemental calcium by mouth 2 (two) times daily.  . polyethylene  glycol powder (GLYCOLAX/MIRALAX) powder Take 17-34 g by mouth daily as needed.  . potassium chloride (K-DUR) 10 MEQ tablet Take 1 tablet (10 mEq total) by mouth 4 (four) times daily.  . sildenafil (REVATIO) 20 MG tablet Take 1 tablet (20 mg total) by mouth 3 (three) times daily. For pulmonary hypertension  . traMADol (ULTRAM) 50 MG tablet Take 1 tablet by mouth every 6 (six) hours as needed for moderate pain.   Marland Kitchen torsemide (  DEMADEX) 20 MG tablet Take 2 tablets (40 mg total) 2 (two) times daily by mouth.   No current facility-administered medications on file prior to visit.     Review of Systems  Constitutional: Negative for activity change, appetite change, chills, diaphoresis, fatigue and fever.  HENT: Negative for congestion, hearing loss and sinus pressure.   Eyes: Negative for visual disturbance.  Respiratory: Negative for apnea, cough, chest tightness, shortness of breath and wheezing.   Cardiovascular: Negative for chest pain, palpitations and leg swelling.  Gastrointestinal: Negative for abdominal pain, anal bleeding, blood in stool, constipation, diarrhea, nausea and vomiting.  Endocrine: Negative for cold intolerance.  Genitourinary: Positive for urgency. Negative for decreased urine volume, difficulty urinating, dysuria, frequency and hematuria.  Musculoskeletal: Negative for arthralgias, back pain and neck pain.  Skin: Negative for rash.  Allergic/Immunologic: Negative for environmental allergies.  Neurological: Negative for dizziness, weakness, light-headedness, numbness and headaches.  Hematological: Negative for adenopathy.  Psychiatric/Behavioral: Negative for behavioral problems, dysphoric mood and sleep disturbance. The patient is not nervous/anxious.    Per HPI unless specifically indicated above      Objective:    BP (!) 84/45 (BP Location: Left Arm, Patient Position: Sitting, Cuff Size: Normal)   Pulse 85   Temp 97.8 F (36.6 C)   Resp 16   Ht 4\' 9"  (1.448 m)    Wt 86 lb (39 kg)   SpO2 94%   BMI 18.61 kg/m   Wt Readings from Last 3 Encounters:  03/11/18 86 lb (39 kg)  03/04/18 84 lb 6.4 oz (38.3 kg)  01/17/18 89 lb 6 oz (40.5 kg)    Physical Exam  Constitutional: She is oriented to person, place, and time. She appears well-developed and well-nourished. No distress.  Thin appearing 84 year old chronically ill female, comfortable, cooperative  HENT:  Head: Normocephalic and atraumatic.  Mouth/Throat: Oropharynx is clear and moist.  Supplemental O2 on via [place]  Eyes: Pupils are equal, round, and reactive to light. Conjunctivae and EOM are normal. Right eye exhibits no discharge. Left eye exhibits no discharge.  Neck: Normal range of motion. Neck supple.  Cardiovascular: Normal rate, regular rhythm, normal heart sounds and intact distal pulses.  No murmur heard. Pulmonary/Chest: Effort normal. No respiratory distress. She has no wheezes. She has no rales.  Mild reduced air movement at baseline, non focal. No wheezing or crackles  Abdominal: Soft. Bowel sounds are normal. She exhibits no distension and no mass. There is no tenderness.  Musculoskeletal: Normal range of motion. She exhibits no edema or tenderness.  Muscle wasting atrophy reduced bulk in upper body extremities and face temples bilaterally  Upper / Lower Extremities: - Normal strength bilateral upper extremities 5/5, lower extremities 5/5  Lymphadenopathy:    She has no cervical adenopathy.  Neurological: She is alert and oriented to person, place, and time.  Distal sensation intact to light touch all extremities  Skin: Skin is warm and dry. No rash noted. She is not diaphoretic. No erythema.  Psychiatric: She has a normal mood and affect. Her behavior is normal.  Well groomed, good eye contact, normal speech and thoughts  Nursing note and vitals reviewed.  Results for orders placed or performed in visit on 03/03/18  T4, free  Result Value Ref Range   Free T4 1.0 0.8 - 1.8  ng/dL  TSH  Result Value Ref Range   TSH 2.93 0.40 - 4.50 mIU/L  CBC with Differential/Platelet  Result Value Ref Range   WBC 4.0  3.8 - 10.8 Thousand/uL   RBC 4.21 3.80 - 5.10 Million/uL   Hemoglobin 13.1 11.7 - 15.5 g/dL   HCT 38.2 35.0 - 45.0 %   MCV 90.7 80.0 - 100.0 fL   MCH 31.1 27.0 - 33.0 pg   MCHC 34.3 32.0 - 36.0 g/dL   RDW 14.8 11.0 - 15.0 %   Platelets 218 140 - 400 Thousand/uL   MPV 10.2 7.5 - 12.5 fL   Neutro Abs 2,856 1,500 - 7,800 cells/uL   Lymphs Abs 636 (L) 850 - 3,900 cells/uL   WBC mixed population 400 200 - 950 cells/uL   Eosinophils Absolute 80 15 - 500 cells/uL   Basophils Absolute 28 0 - 200 cells/uL   Neutrophils Relative % 71.4 %   Total Lymphocyte 15.9 %   Monocytes Relative 10.0 %   Eosinophils Relative 2.0 %   Basophils Relative 0.7 %  Lipid panel  Result Value Ref Range   Cholesterol 202 (H) <200 mg/dL   HDL 58 >50 mg/dL   Triglycerides 91 <150 mg/dL   LDL Cholesterol (Calc) 125 (H) mg/dL (calc)   Total CHOL/HDL Ratio 3.5 <5.0 (calc)   Non-HDL Cholesterol (Calc) 144 (H) <130 mg/dL (calc)  Hemoglobin A1c  Result Value Ref Range   Hgb A1c MFr Bld 6.2 (H) <5.7 % of total Hgb   Mean Plasma Glucose 131 (calc)   eAG (mmol/L) 7.3 (calc)  COMPLETE METABOLIC PANEL WITH GFR  Result Value Ref Range   Glucose, Bld 87 65 - 99 mg/dL   BUN 27 (H) 7 - 25 mg/dL   Creat 0.67 0.60 - 0.88 mg/dL   GFR, Est Non African American 81 > OR = 60 mL/min/1.62m2   GFR, Est African American 94 > OR = 60 mL/min/1.34m2   BUN/Creatinine Ratio 40 (H) 6 - 22 (calc)   Sodium 139 135 - 146 mmol/L   Potassium 4.0 3.5 - 5.3 mmol/L   Chloride 97 (L) 98 - 110 mmol/L   CO2 31 20 - 32 mmol/L   Calcium 9.0 8.6 - 10.4 mg/dL   Total Protein 7.1 6.1 - 8.1 g/dL   Albumin 3.7 3.6 - 5.1 g/dL   Globulin 3.4 1.9 - 3.7 g/dL (calc)   AG Ratio 1.1 1.0 - 2.5 (calc)   Total Bilirubin 0.5 0.2 - 1.2 mg/dL   Alkaline phosphatase (APISO) 72 33 - 130 U/L   AST 32 10 - 35 U/L   ALT 29 6 - 29  U/L      Assessment & Plan:   Problem List Items Addressed This Visit    Chronic diastolic CHF (congestive heart failure) (HCC)    Stable without flare in interval Followed by [facility] Continue current med regimen with diuretic, use caution given low BP and weight Monitor weight closely      Relevant Medications   rosuvastatin (CRESTOR) 10 MG tablet   COPD (chronic obstructive pulmonary disease) (HCC)    Stable without flare Followed by [facility] Pulm Dr Raul Del On 24 hour O2 supplemental No maintenance inhaler, rarely using Albuterol, will get refill from Pulm      Elevated hemoglobin A1c    Stable A1c 6.2 from 6.3 Considered Pre-Diabetes Concern with malnutrition and other chronic disease, her blood sugar is not primary concern, emphasized improve diet intake high protein foods, goal to maintain and gain some wt not avoid portions and sugar      Essential hypertension    Mild low BP today, asymptomatic Home monitoring BP  normal Continue current meds, follow-up with Cardiology closely      Relevant Medications   rosuvastatin (CRESTOR) 10 MG tablet   Hyperlipidemia    Improved cholesterol on statin, however may not be taking Last lipid panel 02/2018  Plan: 1. Continue current meds - Re ordered Rosuvastatin 10mg  daily      Relevant Medications   rosuvastatin (CRESTOR) 10 MG tablet   Hypothyroidism    Stable well controlled Last TSH Free T4 normal Continue current low dose Levothyroxine 44mcg daily, refilled      Relevant Medications   levothyroxine (SYNTHROID, LEVOTHROID) 25 MCG tablet   Protein-calorie malnutrition, severe (HCC)    Clinically consistent with muscle wasting atrophy low wt, recent wt loss 2.5 lbs in 2 weeks, and overall down 3-4 lbs in 2 months - Recent lab low albumin 2.8 down from 3.3 - Emphasized importance of inc protein in diet, may try OTC supplement ensure, boost, but she does not like taste, emphasize more protein in regular diet  then,follow-up as planned       Other Visit Diagnoses    Annual physical exam    -  Primary Reviewed health maintenance, up to date Reviewed labs Encourage improve diet high protein as discussed     Urinary urgency       Clinically consistent with mixed OAB, start trial again Myrbetriq did not take before, if improving may continue, or if need next step refer to Urology   Relevant Medications   mirabegron ER (MYRBETRIQ) 25 MG TB24 tablet   OAB (overactive bladder)       Relevant Medications   mirabegron ER (MYRBETRIQ) 25 MG TB24 tablet      Meds ordered this encounter  Medications  . mirabegron ER (MYRBETRIQ) 25 MG TB24 tablet    Sig: Take 1 tablet (25 mg total) by mouth daily.    Dispense:  30 tablet    Refill:  2  . levothyroxine (SYNTHROID, LEVOTHROID) 25 MCG tablet    Sig: Take 1 tablet (25 mcg total) by mouth daily before breakfast. Ran out of meds.    Dispense:  90 tablet    Refill:  3  . DISCONTD: rosuvastatin (CRESTOR) 10 MG tablet    Sig: Take 1 tablet (10 mg total) by mouth daily.    Dispense:  90 tablet    Refill:  3  . rosuvastatin (CRESTOR) 10 MG tablet    Sig: Take 1 tablet (10 mg total) by mouth at bedtime.    Dispense:  90 tablet    Refill:  3    Follow up plan: Return in about 3 months (around 06/10/2018) for Bladder OAB med adjust, Weight / Protein.  Nobie Putnam, [facility] 03/11/2018, 1:31 PM

## 2018-03-11 NOTE — Assessment & Plan Note (Signed)
Stable A1c 6.2 from 6.3 Considered Pre-Diabetes Concern with malnutrition and other chronic disease, her blood sugar is not primary concern, emphasized improve diet intake high protein foods, goal to maintain and gain some wt not avoid portions and sugar

## 2018-03-17 ENCOUNTER — Encounter: Payer: PPO | Admitting: Family Medicine

## 2018-04-15 NOTE — Progress Notes (Signed)
Patient ID: Jennifer Skinner, female    DOB: [birth year], 84 y.o.   MRN: [id number]  HPI  Jennifer Skinner is a 84 y/o female who has a history of MV prolapse, COPD, HTN, GERD and HF with an EF of 60-65% who returns for follow-up today.    Echo report from 01/07/18 reviewed and showed an EF of 55-60% along with moderate/severe MR, moderate TR and severely elevated PA pressure of 70 mm Hg. Echo done 10/11/15 showed an EF of 60-65% with moderate mitral regurgitation but without aortic stenosis. Mild to moderate TR,  PASP 56mg  Hg.  Admitted 01/07/18 due to acute heart failure. Cardiology, pulmonology and palliative care consults were obtained. Oxygen at 6L was continued until at discharge. Needs pulmonary HTN follow-up and she was discharged after 4 days. Was in the ED 09/05/17 for UTI where she was treated and released.   Returns today for a follow-up visit with a chief complaint of moderate fatigue upon minimal exertion. She says this has been chronic in nature over many years. She has associated chronic back pain along with this. She denies difficulty sleeping, abdominal distention, palpitations, edema, chest pain, shortness of breath, dizziness or weight gain.   Past Medical History:  Diagnosis Date  . 1st degree AV block   . Arthritis   . Chronic diastolic CHF (congestive heart failure) ([place])    a. echo 2014: EF 55-60%, nl wall motion, GR1DD, mild MR, moder mitral prolapse, PASP 40 mm Hg; b. echo 09/2015: EF 60-65%, nl wall motion, GR1DD, mod MR, mild to mod TR, PASP 56 mm Hg  . COPD (chronic obstructive pulmonary disease) ([place])   . Degenerative disc disease, lumbar   . Gastro-esophageal reflux   . Hyperlipidemia   . Mitral prolapse   . Mitral regurgitation   . Osteoporosis    Past Surgical History:  Procedure Laterality Date  . ABDOMINAL HYSTERECTOMY    . APPENDECTOMY    . DILATION AND CURETTAGE OF UTERUS    . HYSTEROTOMY    . right breast biopsy    . right eye      surgery  . TONSILLECTOMY      Family History  Problem Relation Age of Onset  . CVA Mother 3  . Heart attack Father 5  . Throat cancer Sister   . CAD Brother    Social History   Tobacco Use  . Smoking status: Never Smoker  . Smokeless tobacco: Never Used  Substance Use Topics  . Alcohol use: No    Alcohol/week: 0.0 oz   Allergies  Allergen Reactions  . Penicillins     Rash    Prior to Admission medications   Medication Sig Start Date End Date Taking? Authorizing Provider  acetaminophen (TYLENOL) 500 MG tablet Take 500 mg by mouth every 6 (six) hours as needed.   Yes [provider]  albuterol (PROVENTIL) (2.5 MG/3ML) 0.083% nebulizer solution Take 3 mLs (2.5 mg total) by nebulization every 6 (six) hours as needed for wheezing or shortness of breath. 10/12/15  Yes Epifanio Lesches, MD  aspirin 325 MG tablet Take 325 mg by mouth daily.   Yes [provider]  baclofen (LIORESAL) 10 MG tablet Take 0.5-1 tablets (5-10 mg total) by mouth 3 (three) times daily as needed for muscle spasms. 08/19/17  Yes Karamalegos, Devonne Doughty, DO  Calcium Gluconate 500 MG CAPS Take 2 capsules by mouth daily.    Yes [provider]  gabapentin (NEURONTIN) 100 MG capsule Take 1  capsule (100 mg total) by mouth 3 (three) times daily. 06/04/17  Yes Karamalegos, Devonne Doughty, DO  levothyroxine (SYNTHROID, LEVOTHROID) 25 MCG tablet Take 1 tablet (25 mcg total) by mouth daily before breakfast. Ran out of meds. 03/11/18  Yes Karamalegos, Devonne Doughty, DO  metoprolol tartrate (LOPRESSOR) 25 MG tablet Take 0.5 tablets (12.5 mg total) by mouth 2 (two) times daily. 03/12/17  Yes Karamalegos, Devonne Doughty, DO  mirabegron ER (MYRBETRIQ) 25 MG TB24 tablet Take 1 tablet (25 mg total) by mouth daily. 03/11/18  Yes Karamalegos, Devonne Doughty, DO  Multiple Vitamin (MULTI-VITAMIN DAILY PO) Take 1 tablet by mouth daily.    Yes [provider]  [name] (OYSTER CALCIUM) 500 MG TABS Take 500 mg of elemental calcium by mouth  2 (two) times daily.   Yes [provider]  polyethylene glycol powder (GLYCOLAX/MIRALAX) powder Take 17-34 g by mouth daily as needed. 08/19/17  Yes Karamalegos, Devonne Doughty, DO  potassium chloride (K-DUR) 10 MEQ tablet Take 1 tablet (10 mEq total) by mouth 4 (four) times daily. 12/17/17  Yes Gollan, Kathlene November, MD  rosuvastatin (CRESTOR) 10 MG tablet Take 1 tablet (10 mg total) by mouth at bedtime. 03/11/18  Yes Karamalegos, Devonne Doughty, DO  sildenafil (REVATIO) 20 MG tablet Take 1 tablet (20 mg total) by mouth 3 (three) times daily. For pulmonary hypertension 01/16/18  Yes Olin Hauser, DO  torsemide (DEMADEX) 20 MG tablet Take 2 tablets (40 mg total) 2 (two) times daily by mouth. 10/28/17 04/16/18 Yes Delayla Hoffmaster, Aura Fey, FNP  traMADol (ULTRAM) 50 MG tablet Take 1 tablet by mouth every 6 (six) hours as needed for moderate pain.  08/23/14  Yes [provider]    Review of Systems  Constitutional: Positive for fatigue. Negative for appetite change.  HENT: Negative for congestion, postnasal drip and sore throat.   Eyes: Negative.   Respiratory: Negative for cough, chest tightness and shortness of breath.   Cardiovascular: Negative for chest pain, palpitations and leg swelling.  Gastrointestinal: Negative for abdominal distention and abdominal pain.  Endocrine: Negative.   Genitourinary: Negative.   Musculoskeletal: Positive for back pain. Negative for neck pain.  Skin: Negative.   Allergic/Immunologic: Negative.   Neurological: Negative for dizziness, weakness and light-headedness.  Hematological: Negative for adenopathy. Does not bruise/bleed easily.  Psychiatric/Behavioral: Negative for dysphoric mood and sleep disturbance (sleeping on 1 pillow). The patient is not nervous/anxious.    Vitals:   04/16/18 1126  BP: (!) 80/54  Pulse: 81  Resp: 18  SpO2: 93%  Weight: 85 lb 6 oz (38.7 kg)  Height: 4\' 9"  (1.448 m)   Wt Readings from Last 3 Encounters:  04/16/18 85 lb 6  oz (38.7 kg)  03/11/18 86 lb (39 kg)  03/04/18 84 lb 6.4 oz (38.3 kg)   Lab Results  Component Value Date   CREATININE 0.67 03/04/2018   CREATININE 0.55 01/10/2018   CREATININE 0.72 01/09/2018   Physical Exam  Constitutional: She is oriented to person, place, and time. She appears well-developed and well-nourished.  HENT:  Head: Normocephalic and atraumatic.  Neck: Normal range of motion. Neck supple. No JVD present.  Cardiovascular: Normal rate and regular rhythm.  Pulmonary/Chest: Effort normal. She has no wheezes. She has no rales.  Abdominal: Soft. She exhibits no distension. There is no tenderness.  Musculoskeletal: She exhibits no edema or tenderness.  Neurological: She is alert and oriented to person, place, and time.  Skin: Skin is warm and dry.  Psychiatric: She  has a normal mood and affect. Her behavior is normal. Thought content normal.  Nursing note and vitals reviewed.   Assessment & Plan:  1: Chronic heart failure with preserved ejection fraction- - NYHA Class III - euvolemic today - Not adding salt to her food/reading food labels some - Continues to weigh daily. Reminded to call for an overnight weight gain of >2 pounds or weekly weight gain of >5 pounds - weight stable from 03/11/18 - Saw cardiologist Rockey Situ) on 10/15/17 - BNP on 01/07/18 reviewed and was 3070.0  2: HTN- - BP on the low side  - decrease torsemide to 20mg  twice daily; should she develop edema/weight gain can increase her torsemide back to 40mg  twice daily for a few days - Saw PCP Parks Ranger) on 03/11/18 - BMP from 03/07/18 reviewed and showed sodium 139, potassium 4.0 and GFR 81  3: Lymphedema- - stage 2 - doesn't elevate her legs much and she was encouraged to elevate her legs when she's sitting for long periods - wearing support socks daily and currently has no edema - unable to exercise due to severe kyphosis and chronic back pain  4: Pulmonary HTN- - saw pulmonologist Raul Del) 02/12/18 &  returns to him next week -is now taking sildenafil but had to pay cash for it - wearing oxygent at 2L around the clock and checking her oxygen levels at home  Patient did not bring her medications nor a list. Each medication was verbally reviewed with the patient and she was encouraged to bring the bottles to every visit to confirm accuracy of list.  Return in 4 months or sooner for any questions/problems before then.

## 2018-04-16 ENCOUNTER — Other Ambulatory Visit: Payer: Self-pay | Admitting: Family Medicine

## 2018-04-16 ENCOUNTER — Ambulatory Visit: Payer: PPO | Attending: Family | Admitting: Family

## 2018-04-16 ENCOUNTER — Encounter: Payer: Self-pay | Admitting: Family

## 2018-04-16 VITALS — BP 80/54 | HR 81 | Resp 18 | Ht <= 58 in | Wt 85.4 lb

## 2018-04-16 DIAGNOSIS — I272 Pulmonary hypertension, unspecified: Secondary | ICD-10-CM | POA: Insufficient documentation

## 2018-04-16 DIAGNOSIS — I110 Hypertensive heart disease with heart failure: Secondary | ICD-10-CM | POA: Insufficient documentation

## 2018-04-16 DIAGNOSIS — K219 Gastro-esophageal reflux disease without esophagitis: Secondary | ICD-10-CM | POA: Diagnosis not present

## 2018-04-16 DIAGNOSIS — Z8249 Family history of ischemic heart disease and other diseases of the circulatory system: Secondary | ICD-10-CM | POA: Diagnosis not present

## 2018-04-16 DIAGNOSIS — J449 Chronic obstructive pulmonary disease, unspecified: Secondary | ICD-10-CM | POA: Insufficient documentation

## 2018-04-16 DIAGNOSIS — I10 Essential (primary) hypertension: Secondary | ICD-10-CM

## 2018-04-16 DIAGNOSIS — G8929 Other chronic pain: Secondary | ICD-10-CM | POA: Diagnosis not present

## 2018-04-16 DIAGNOSIS — I5032 Chronic diastolic (congestive) heart failure: Secondary | ICD-10-CM | POA: Insufficient documentation

## 2018-04-16 DIAGNOSIS — I890 Lymphedema, not elsewhere classified: Secondary | ICD-10-CM | POA: Diagnosis not present

## 2018-04-16 DIAGNOSIS — Z823 Family history of stroke: Secondary | ICD-10-CM | POA: Diagnosis not present

## 2018-04-16 DIAGNOSIS — E785 Hyperlipidemia, unspecified: Secondary | ICD-10-CM | POA: Insufficient documentation

## 2018-04-16 DIAGNOSIS — M549 Dorsalgia, unspecified: Secondary | ICD-10-CM | POA: Insufficient documentation

## 2018-04-16 DIAGNOSIS — Z808 Family history of malignant neoplasm of other organs or systems: Secondary | ICD-10-CM | POA: Diagnosis not present

## 2018-04-16 DIAGNOSIS — I341 Nonrheumatic mitral (valve) prolapse: Secondary | ICD-10-CM | POA: Insufficient documentation

## 2018-04-16 DIAGNOSIS — I340 Nonrheumatic mitral (valve) insufficiency: Secondary | ICD-10-CM | POA: Insufficient documentation

## 2018-04-16 DIAGNOSIS — Z880 Allergy status to penicillin: Secondary | ICD-10-CM | POA: Insufficient documentation

## 2018-04-16 DIAGNOSIS — I440 Atrioventricular block, first degree: Secondary | ICD-10-CM | POA: Diagnosis not present

## 2018-04-16 DIAGNOSIS — Z79899 Other long term (current) drug therapy: Secondary | ICD-10-CM | POA: Diagnosis not present

## 2018-04-16 NOTE — Patient Instructions (Addendum)
Continue weighing daily and call for an overnight weight gain of > 2 pounds or a weekly weight gain of >5 pounds.  Decrease fluid pill to 1 tablet twice daily. Should your weight increase or swelling develop in your legs, you can take 2 fluid pills twice daily for a couple of days.

## 2018-04-30 DIAGNOSIS — I272 Pulmonary hypertension, unspecified: Secondary | ICD-10-CM | POA: Diagnosis not present

## 2018-05-22 ENCOUNTER — Telehealth: Payer: Self-pay | Admitting: Family Medicine

## 2018-05-22 NOTE — Telephone Encounter (Signed)
Received fax from [facility] regarding new request rx for Tramadol. I have not prescribed this for the patient before. She has historical rx on file that she has received for long time from [facility] Dr Sharlet Salina and Loree Fee Meeler FNP. Last fill 04/2018, checked [place] CSRS PMP AWARE, for past 2 years.  I called patient but did not reach her.  [facility] and confirmed that they will go ahead and send fax request rx refill to Dr Sharlet Salina and if they do not proceed with rx and if patient prefer to transition this care to me, they or the patient will let me know.  Nobie Putnam, [facility] 05/22/2018, 1:18 PM

## 2018-05-22 NOTE — Telephone Encounter (Signed)
Pt asked for a refill on tramadol.  She uses [facility].  Her call back number is [phone]

## 2018-05-26 DIAGNOSIS — M5136 Other intervertebral disc degeneration, lumbar region: Secondary | ICD-10-CM | POA: Diagnosis not present

## 2018-05-26 DIAGNOSIS — M48062 Spinal stenosis, lumbar region with neurogenic claudication: Secondary | ICD-10-CM | POA: Diagnosis not present

## 2018-05-26 DIAGNOSIS — M5416 Radiculopathy, lumbar region: Secondary | ICD-10-CM | POA: Diagnosis not present

## 2018-06-03 ENCOUNTER — Ambulatory Visit: Payer: PPO | Admitting: Family

## 2018-06-10 ENCOUNTER — Encounter: Payer: Self-pay | Admitting: Family Medicine

## 2018-06-10 ENCOUNTER — Ambulatory Visit (INDEPENDENT_AMBULATORY_CARE_PROVIDER_SITE_OTHER): Payer: PPO | Admitting: Family Medicine

## 2018-06-10 VITALS — BP 105/56 | HR 81 | Temp 98.4°F | Resp 16 | Ht <= 58 in | Wt 86.0 lb

## 2018-06-10 DIAGNOSIS — I272 Pulmonary hypertension, unspecified: Secondary | ICD-10-CM

## 2018-06-10 DIAGNOSIS — M545 Low back pain: Secondary | ICD-10-CM

## 2018-06-10 DIAGNOSIS — J410 Simple chronic bronchitis: Secondary | ICD-10-CM | POA: Diagnosis not present

## 2018-06-10 DIAGNOSIS — N3281 Overactive bladder: Secondary | ICD-10-CM

## 2018-06-10 DIAGNOSIS — I5032 Chronic diastolic (congestive) heart failure: Secondary | ICD-10-CM

## 2018-06-10 DIAGNOSIS — G8929 Other chronic pain: Secondary | ICD-10-CM

## 2018-06-10 DIAGNOSIS — E43 Unspecified severe protein-calorie malnutrition: Secondary | ICD-10-CM

## 2018-06-10 MED ORDER — MIRABEGRON ER 25 MG PO TB24: 25.0000 mg | ORAL_TABLET | Freq: Every day | ORAL | 5 refills | Status: DC

## 2018-06-10 NOTE — Assessment & Plan Note (Signed)
Clinically stable without exacerbation Stable weight currently. Appears euvolemic Hemodynamically stable Followed by [facility] Cardiology Dr Rockey Situ Continue current diuretic Torsemide per Cardiology recs - she may reduce dose to 20mg  BID in future as she was advised previously, seems still taking x 2 pills BID most often for edema

## 2018-06-10 NOTE — Assessment & Plan Note (Signed)
Clinically seems improved urination on Myrbetriq trial Some urinary frequency due to diuretic Refill Myrbetriq 25mg  daily in AM for now - tolerating well and thinks she has better or more complete voiding on it

## 2018-06-10 NOTE — Assessment & Plan Note (Addendum)
Stable chronic problem Followed by [facility] Cardiology and [facility]ology On Sildenafil, supplemental O2

## 2018-06-10 NOTE — Assessment & Plan Note (Signed)
Improved, weight gain half lb to 1 lb in 3 months Clinically consistent with muscle wasting atrophy low wt, previous weight loss stabilized Low albumin documented - Emphasized importance of inc protein in diet, may try OTC supplement ensure, boost, but she does not like taste, emphasize more protein in regular diet then,follow-up as planned

## 2018-06-10 NOTE — Assessment & Plan Note (Signed)
Stable without flare Followed by [facility] Pulm Dr Raul Del On 24 hour O2 supplemental - 3L currently Using Albuterol PRN

## 2018-06-10 NOTE — Progress Notes (Signed)
Subjective:    Patient ID: Jennifer Skinner, female    DOB: 1931-09-05, 84 y.o.   MRN: [id number]  GEARLENE Skinner is a 84 y.o. female presenting on 06/10/2018 for Hypertension   HPI   CHRONIC HTN / Chronic Diastolic CHF / Mitral Valve Regurgitation / Pulmonary HTN - Last visit with me 03/2018, she has seen Cardiology and Pulmonology in 04/2018 in follow-up - Interval update, she remains on O2 see below, regarding fluid status, she reports taking Torsemide 20mg  x 2 pills twice daily most days, Cardiology advised her to possibly decrease this to 1 pill twice daily if not needed. - Today doing well, no new complaints with breathing, or edema, seems to be well controlled wearing compression stockings - No recent CHF flare Current Meds - Metoprolol 12.5mg  (Half of 25mg  tab) BID,Torsemide20mg  x2 = 40mg  - BID - Taking Potassium 71mEq 4 times daily - Continues on Sildenafil for Pulm HTN - Continues on current meds, see list  COPD, Chronic Respiratory Failure Followed by [facility] Pulmonology Dr Raul Del, she continues on supplemental oxygen 24 hours, now on 3L regularly previously was on 2 L in past. Has Albuterol nebulizer at home PRN using with some relief at times. - She has no new respiratory complaints, in past without oxygen had symptoms of dyspnea could not lay down, this is resolved on O2. Also they check her home O2% and it is < 88 off O2 and low 90 to 95% on oxygen  Overactive Bladder (OAB) / Urinary frequency Previous follow-up has been given rx Myrbetriq, she was unfamiliar with this in past, unsure if helping. She admits known urinary frequency on diuretic medication, but still thinks she had some difficulty emptying bladder and overactive bladder symptoms. - She thinks it is improved on Myrbetriq and she would like to continue this 25mg  daily, due for refills just picked up last rx recently She has not seen Urologist not intersted at this time  Chronic Back Pain / DDD lumbar  spine Previously followed regularly by Dr Sharlet Salina [facility], has long history of prior MRI and other work-up, had some question spinal stenosis in addition to degenerative changes. She also has old compression deformity, prior MRI done 2015, she has received spinal epidural injections in past without significant relief. Ultimately she remains stable and functional on chronic Tramadol rx taking 50mg  QID and usually gets 6 month rx, this was recently refilled by Dr Sharlet Salina 05/2018, she is covered for 6 months based on this, and now we agree that she will contact us for refill on this and she will not return to Dr Sharlet Salina for this rx at this time.  Protein Calorie Malnutrition, severe Recent update weight gain half lb to 1 lb in past 3 months. She admits improving diet. Still not eating ensure does not like the sweet supplements. She checks weight in AM usually weigh less, and weight gain will increase by end of evening after meals  Depression screen [facility] 2/9 06/10/2018 04/16/2018 03/11/2018  Decreased Interest 0 0 0  Down, Depressed, Hopeless 0 0 0  PHQ - 2 Score 0 0 0  Altered sleeping - - 0  Tired, decreased energy - - 0  Change in appetite - - 0  Feeling bad or failure about yourself  - - 0  Trouble concentrating - - 0  Moving slowly or fidgety/restless - - 0  Suicidal thoughts - - 0  PHQ-9 Score - - 0  Difficult doing work/chores - - Not difficult  at all    Social History   Tobacco Use  . Smoking status: Never Smoker  . Smokeless tobacco: Never Used  Substance Use Topics  . Alcohol use: No    Alcohol/week: 0.0 oz  . Drug use: No    Review of Systems Per HPI unless specifically indicated above     Objective:    BP (!) 105/56   Pulse 81   Temp 98.4 F (36.9 C) (Oral)   Resp 16   Ht 4\' 9"  (1.448 m)   Wt 86 lb (39 kg)   SpO2 95% Comment: on 3 liter of O2  BMI 18.61 kg/m   Wt Readings from Last 3 Encounters:  06/10/18 86 lb (39 kg)  04/16/18 85 lb 6 oz (38.7 kg)   03/11/18 86 lb (39 kg)    Physical Exam  Constitutional: She is oriented to person, place, and time. She appears well-developed and well-nourished. No distress.  Thin appearing 84 year old chronically ill female, comfortable and very pleasant today, cooperative  HENT:  Head: Normocephalic and atraumatic.  Mouth/Throat: Oropharynx is clear and moist.  Supplemental 3L O2 on via [place] portable o2  Eyes: Pupils are equal, round, and reactive to light. Conjunctivae and EOM are normal. Right eye exhibits no discharge. Left eye exhibits no discharge.  Neck: Normal range of motion. Neck supple.  Cardiovascular: Normal rate, regular rhythm and intact distal pulses.  Pulmonary/Chest: Effort normal. No respiratory distress. She has no wheezes. She has no rales.  Mild reduced air movement at baseline, otherwise decent air movement today, speaks full sentences, non focal. No wheezing or crackles  Abdominal: Soft. Bowel sounds are normal. She exhibits no distension and no mass. There is no tenderness.  Musculoskeletal: Normal range of motion. She exhibits no edema or tenderness.  Muscle wasting atrophy reduced bulk in upper body extremities and face temples bilaterally  Upper / Lower Extremities: - Normal strength bilateral upper extremities 5/5, lower extremities 5/5  Lymphadenopathy:    She has no cervical adenopathy.  Neurological: She is alert and oriented to person, place, and time.  Distal sensation intact to light touch all extremities  Skin: Skin is warm and dry. No rash noted. She is not diaphoretic. No erythema.  Psychiatric: She has a normal mood and affect. Her behavior is normal.  Well groomed, good eye contact, normal speech and thoughts  Nursing note and vitals reviewed.  Results for orders placed or performed in visit on 03/03/18  T4, free  Result Value Ref Range   Free T4 1.0 0.8 - 1.8 ng/dL  TSH  Result Value Ref Range   TSH 2.93 0.40 - 4.50 mIU/L  CBC with  Differential/Platelet  Result Value Ref Range   WBC 4.0 3.8 - 10.8 Thousand/uL   RBC 4.21 3.80 - 5.10 Million/uL   Hemoglobin 13.1 11.7 - 15.5 g/dL   HCT 38.2 35.0 - 45.0 %   MCV 90.7 80.0 - 100.0 fL   MCH 31.1 27.0 - 33.0 pg   MCHC 34.3 32.0 - 36.0 g/dL   RDW 14.8 11.0 - 15.0 %   Platelets 218 140 - 400 Thousand/uL   MPV 10.2 7.5 - 12.5 fL   Neutro Abs 2,856 1,500 - 7,800 cells/uL   Lymphs Abs 636 (L) 850 - 3,900 cells/uL   WBC mixed population 400 200 - 950 cells/uL   Eosinophils Absolute 80 15 - 500 cells/uL   Basophils Absolute 28 0 - 200 cells/uL   Neutrophils Relative % 71.4 %  Total Lymphocyte 15.9 %   Monocytes Relative 10.0 %   Eosinophils Relative 2.0 %   Basophils Relative 0.7 %  Lipid panel  Result Value Ref Range   Cholesterol 202 (H) <200 mg/dL   HDL 58 >50 mg/dL   Triglycerides 91 <150 mg/dL   LDL Cholesterol (Calc) 125 (H) mg/dL (calc)   Total CHOL/HDL Ratio 3.5 <5.0 (calc)   Non-HDL Cholesterol (Calc) 144 (H) <130 mg/dL (calc)  Hemoglobin A1c  Result Value Ref Range   Hgb A1c MFr Bld 6.2 (H) <5.7 % of total Hgb   Mean Plasma Glucose 131 (calc)   eAG (mmol/L) 7.3 (calc)  COMPLETE METABOLIC PANEL WITH GFR  Result Value Ref Range   Glucose, Bld 87 65 - 99 mg/dL   BUN 27 (H) 7 - 25 mg/dL   Creat 0.67 0.60 - 0.88 mg/dL   GFR, Est Non African American 81 > OR = 60 mL/min/1.40m2   GFR, Est African American 94 > OR = 60 mL/min/1.57m2   BUN/Creatinine Ratio 40 (H) 6 - 22 (calc)   Sodium 139 135 - 146 mmol/L   Potassium 4.0 3.5 - 5.3 mmol/L   Chloride 97 (L) 98 - 110 mmol/L   CO2 31 20 - 32 mmol/L   Calcium 9.0 8.6 - 10.4 mg/dL   Total Protein 7.1 6.1 - 8.1 g/dL   Albumin 3.7 3.6 - 5.1 g/dL   Globulin 3.4 1.9 - 3.7 g/dL (calc)   AG Ratio 1.1 1.0 - 2.5 (calc)   Total Bilirubin 0.5 0.2 - 1.2 mg/dL   Alkaline phosphatase (APISO) 72 33 - 130 U/L   AST 32 10 - 35 U/L   ALT 29 6 - 29 U/L      Assessment & Plan:   Problem List Items Addressed This Visit     Chronic back pain (Chronic)    Stable clinically without acute back pain flare Chronic history problem Followed by Dr Sharlet Salina [facility] for chronic Tramadol previously, last rx 05/2018 for 6 months Discussion on agree to switch this Tramadol rx 50mg  QID #120 +5 refills to our office for her convenience in future to continue this to help her daily function overall - she will notify us when low on rx and due for renew after last refill and we can renew it. She will not return to Dr Sharlet Salina for now unless indicated. I have sent an EHR message through Epic to Dr Sharlet Salina to notify him of this plan      Chronic diastolic CHF (congestive heart failure) ([place])    Clinically stable without exacerbation Stable weight currently. Appears euvolemic Hemodynamically stable Followed by [facility] Cardiology Dr Rockey Situ Continue current diuretic Torsemide per Cardiology recs - she may reduce dose to 20mg  BID in future as she was advised previously, seems still taking x 2 pills BID most often for edema      COPD (chronic obstructive pulmonary disease) (HCC)    Stable without flare Followed by [facility] Pulm Dr Raul Del On 24 hour O2 supplemental - 3L currently Using Albuterol PRN      OAB (overactive bladder) - Primary    Clinically seems improved urination on Myrbetriq trial Some urinary frequency due to diuretic Refill Myrbetriq 25mg  daily in AM for now - tolerating well and thinks she has better or more complete voiding on it      Relevant Medications   mirabegron ER (MYRBETRIQ) 25 MG TB24 tablet   Protein-calorie malnutrition, severe (HCC)    Improved, weight gain half  lb to 1 lb in 3 months Clinically consistent with muscle wasting atrophy low wt, previous weight loss stabilized Low albumin documented - Emphasized importance of inc protein in diet, may try OTC supplement ensure, boost, but she does not like taste, emphasize more protein in regular diet then,follow-up as planned      Pulmonary HTN ([place])    Stable  chronic problem Followed by [facility] Cardiology and [facility] On Sildenafil, supplemental O2         Meds ordered this encounter  Medications  . mirabegron ER (MYRBETRIQ) 25 MG TB24 tablet    Sig: Take 1 tablet (25 mg total) by mouth daily.    Dispense:  30 tablet    Refill:  5    Keep added refills on file, patient recently filled rx    Follow up plan: Return in about 4 months (around 10/11/2018) for PreDM A1c, COPD/CHF breathing updates, chronic back pain rx.  Nobie Putnam, [facility] 06/10/2018, 12:06 PM

## 2018-06-10 NOTE — Patient Instructions (Addendum)
Thank you for coming to the office today.  Continue taking Myrbetriq 25mg  daily in morning - every day for Overactive Bladder  Your current rx Tramadol from Dr Sharlet Salina - should be good for up to 6 months - when it is running low in December or January - call our office first to request that I re order this medication, and I will send it to pharmacy and you do not need to return to Dr Sharlet Salina for now.  No new changes for lungs or heart at this time. Continue current medicines.  Please schedule a Follow-up Appointment to: Return in about 4 months (around 10/11/2018) for PreDM A1c, COPD/CHF breathing updates, chronic back pain rx.  If you have any other questions or concerns, please feel free to call the office or send a message through [facility]. You may also schedule an earlier appointment if necessary.  Additionally, you may be receiving a survey about your experience at our office within a few days to 1 week by e-mail or mail. We value your feedback.  Nobie Putnam, DO [place]

## 2018-06-10 NOTE — Assessment & Plan Note (Signed)
Stable clinically without acute back pain flare Chronic history problem Followed by Dr Sharlet Salina [facility] for chronic Tramadol previously, last rx 05/2018 for 6 months Discussion on agree to switch this Tramadol rx 50mg  QID #120 +5 refills to our office for her convenience in future to continue this to help her daily function overall - she will notify us when low on rx and due for renew after last refill and we can renew it. She will not return to Dr Sharlet Salina for now unless indicated. I have sent an EHR message through Epic to Dr Sharlet Salina to notify him of this plan

## 2018-06-19 ENCOUNTER — Other Ambulatory Visit: Payer: Self-pay | Admitting: Family Medicine

## 2018-06-19 DIAGNOSIS — I10 Essential (primary) hypertension: Secondary | ICD-10-CM

## 2018-07-11 ENCOUNTER — Other Ambulatory Visit: Payer: Self-pay | Admitting: Family Medicine

## 2018-07-11 DIAGNOSIS — M5416 Radiculopathy, lumbar region: Secondary | ICD-10-CM

## 2018-07-11 DIAGNOSIS — M47817 Spondylosis without myelopathy or radiculopathy, lumbosacral region: Secondary | ICD-10-CM

## 2018-07-11 DIAGNOSIS — M5136 Other intervertebral disc degeneration, lumbar region: Secondary | ICD-10-CM

## 2018-07-21 ENCOUNTER — Other Ambulatory Visit: Payer: Self-pay | Admitting: Family

## 2018-07-21 ENCOUNTER — Telehealth: Payer: Self-pay | Admitting: Family Medicine

## 2018-07-21 ENCOUNTER — Other Ambulatory Visit: Payer: Self-pay | Admitting: Family Medicine

## 2018-07-21 DIAGNOSIS — I272 Pulmonary hypertension, unspecified: Secondary | ICD-10-CM

## 2018-07-21 MED ORDER — TORSEMIDE 20 MG PO TABS: 40.0000 mg | ORAL_TABLET | Freq: Two times a day (BID) | ORAL | 5 refills | Status: DC

## 2018-07-21 NOTE — Telephone Encounter (Signed)
Rx send

## 2018-07-21 NOTE — Telephone Encounter (Signed)
Pt said Dr. Raliegh Ip could start writing refill on torsemide.  Please send to [facility].  Her number is [phone]

## 2018-07-21 NOTE — Telephone Encounter (Signed)
Pt called requesting  Refill on sildenafil 20 mg called into [address]. Pt call back # is [phone]

## 2018-07-23 DIAGNOSIS — Z9981 Dependence on supplemental oxygen: Secondary | ICD-10-CM | POA: Diagnosis not present

## 2018-07-23 DIAGNOSIS — J90 Pleural effusion, not elsewhere classified: Secondary | ICD-10-CM | POA: Diagnosis not present

## 2018-07-23 DIAGNOSIS — R0609 Other forms of dyspnea: Secondary | ICD-10-CM | POA: Diagnosis not present

## 2018-07-23 DIAGNOSIS — I272 Pulmonary hypertension, unspecified: Secondary | ICD-10-CM | POA: Diagnosis not present

## 2018-08-04 ENCOUNTER — Other Ambulatory Visit: Payer: Self-pay | Admitting: Cardiovascular Disease

## 2018-08-04 MED ORDER — POTASSIUM CHLORIDE ER 10 MEQ PO TBCR: 10.0000 meq | EXTENDED_RELEASE_TABLET | Freq: Four times a day (QID) | ORAL | 3 refills | Status: DC

## 2018-08-04 NOTE — Telephone Encounter (Signed)
Please review for potassium refill. The patient was to follow up with Dr. Rockey Situ in May 2019.

## 2018-08-04 NOTE — Telephone Encounter (Signed)
°*  STAT* If patient is at the pharmacy, call can be transferred to refill team.   1. Which medications need to be refilled? (please list name of each medication and dose if known) potassium   2. Which pharmacy/location (including street and city if local pharmacy) is medication to be sent to? [address] [facility]   3. Do they need a 30 day or 90 day supply? 30 day

## 2018-08-07 NOTE — Telephone Encounter (Signed)
Open in error

## 2018-08-14 DIAGNOSIS — I272 Pulmonary hypertension, unspecified: Secondary | ICD-10-CM | POA: Diagnosis not present

## 2018-08-17 NOTE — Progress Notes (Signed)
Cardiology Office Note  Date:  08/18/2018   ID:  Jennifer Skinner, DOB Oct 28, 1931, MRN [id number]  PCP:  Olin Hauser, DO   Chief Complaint  Patient presents with  . other    6 month follow up. Meds reviewed by the pt. verbally. "doing well."     HPI:  84 year old female with history of  mitral valve prolapse and regurgitation,  diastolic dysfunction,  COPD, and GERD,  hospital admission 10/10/2015 for acute respiratory distress with hypoxia and COPD exacerbation in the setting of right lower lobe PNA and acute on chronic diastolic CHF,  who presents today f/u for acute on chronic diastolic CHF  in follow-up today she reports her lungs has been stable No recent infections or COPD exacerbation Denies any abdominal bloating or leg swelling Uses 3 L on oxygen, followed by Dr. Raul Del  For some reason is taking Torsemide 80 BID !! (4 pills twice a day) Been on the regimen for several months  Was on 20 BID, sometimes 40 BID for worsening SOB When last seen by CHF clinic was recommended she decreased down to 20 twice a day She has CHF appointment tomorrow and will bring in all her bottles to discuss  Previous lab work reviewed HBA1C 6.2 CR <1 Total chol 202  Continued  Back pain, severe discomfort,   cortisone injections did not help Take pain meds  Echo report from 01/07/18 reviewed and showed an EF of 55-60% along with moderate/severe MR, moderate TR and severely elevated PA pressure of 70 mm Hg. Echo done 10/11/15 showed an EF of 60-65% with moderate mitral regurgitation but without aortic stenosis. Mild to moderate TR,  PASP 56mg  Hg.  EKG personally reviewed by myself on todays visit Shows normal sinus rhythm rate 70 bpm nonspecific T wave abnormality  Other past medical history reviewed 10/07/2015, CXR showed RLL PNA superimposed on COPD/emphysema, small right parapneumonic effusion, stable cardiomegaly without evidence of pulmonary edema. CBC unremarkable. She  was diagnosed with a right lower lobe pneumonia at that time and started on Levaquin with outpatient follow up. She returned to the ED on 10/31 with increased SOB and leg swelling x 3 days. She initially felt the LEE was 2/2 Levaquin and the urgent care changed her to doxycycline over the phone. She was found to have a BNP of 1528 upon her arrival to [facility].   Echo showed normal LV systolic funation at 88-41%, normal wall motion, GR1DD, mitral valve prolasp with moderate regurgitation, mild to moderate TR, PASP 56 mm Hg.   PMH:   has a past medical history of 1st degree AV block, Arthritis, Chronic diastolic CHF (congestive heart failure) (HCC), COPD (chronic obstructive pulmonary disease) ([place]), Degenerative disc disease, lumbar, Gastro-esophageal reflux, Hyperlipidemia, Mitral prolapse, Mitral regurgitation, and Osteoporosis.  PSH:    Past Surgical History:  Procedure Laterality Date  . ABDOMINAL HYSTERECTOMY    . APPENDECTOMY    . DILATION AND CURETTAGE OF UTERUS    . HYSTEROTOMY    . right breast biopsy    . right eye      surgery  . TONSILLECTOMY      Current Outpatient Medications  Medication Sig Dispense Refill  . acetaminophen (TYLENOL) 500 MG tablet Take 500 mg by mouth every 6 (six) hours as needed.    Marland Kitchen albuterol (PROVENTIL) (2.5 MG/3ML) 0.083% nebulizer solution Take 3 mLs (2.5 mg total) by nebulization every 6 (six) hours as needed for wheezing or shortness of breath. 75 mL 12  .  aspirin 325 MG tablet Take 325 mg by mouth daily.    . baclofen (LIORESAL) 10 MG tablet Take 0.5-1 tablets (5-10 mg total) by mouth 3 (three) times daily as needed for muscle spasms. 30 each 2  . Calcium Gluconate 500 MG CAPS Take 2 capsules by mouth daily.     Marland Kitchen gabapentin (NEURONTIN) 100 MG capsule Take 1 capsule (100 mg total) by mouth 3 (three) times daily. 270 capsule 1  . levothyroxine (SYNTHROID, LEVOTHROID) 25 MCG tablet Take 1 tablet (25 mcg total) by mouth daily before breakfast. Ran out of  meds. 90 tablet 3  . metoprolol tartrate (LOPRESSOR) 25 MG tablet Take 0.5 tablets (12.5 mg total) by mouth 2 (two) times daily. 90 tablet 1  . mirabegron ER (MYRBETRIQ) 25 MG TB24 tablet Take 1 tablet (25 mg total) by mouth daily. 30 tablet 5  . Multiple Vitamin (MULTI-VITAMIN DAILY PO) Take 1 tablet by mouth daily.     [name] (OYSTER CALCIUM) 500 MG TABS Take 500 mg of elemental calcium by mouth 2 (two) times daily.    . polyethylene glycol powder (GLYCOLAX/MIRALAX) powder Take 17-34 g by mouth daily as needed. 250 g 2  . potassium chloride (K-DUR) 10 MEQ tablet Take 1 tablet (10 mEq total) by mouth 4 (four) times daily. 360 tablet 3  . rosuvastatin (CRESTOR) 10 MG tablet Take 1 tablet (10 mg total) by mouth at bedtime. 90 tablet 3  . sildenafil (REVATIO) 20 MG tablet Take 1 tablet (20 mg total) by mouth 3 (three) times daily. For pulmonary hypertension 90 tablet 3  . torsemide (DEMADEX) 20 MG tablet Take 2 tablets (40 mg total) by mouth 2 (two) times daily. 120 tablet 5  . traMADol (ULTRAM) 50 MG tablet Take 1 tablet by mouth every 6 (six) hours as needed for moderate pain.      No current facility-administered medications for this visit.      Allergies:   Penicillins   Social History:  The patient  reports that she has never smoked. She has never used smokeless tobacco. She reports that she does not drink alcohol or use drugs.   Family History:   family history includes CAD in her brother; CVA (age of onset: 66) in her mother; Heart attack (age of onset: 21) in her father; Throat cancer in her sister.    Review of Systems: Review of Systems  Constitutional: Negative.   Respiratory: Positive for shortness of breath.   Cardiovascular: Negative.   Gastrointestinal: Negative.   Musculoskeletal: Positive for back pain.       Gait instability  Neurological: Negative.   Psychiatric/Behavioral: Negative.   All other systems reviewed and are negative.    PHYSICAL EXAM: VS:   BP (!) 100/50 (BP Location: Left Arm, Patient Position: Sitting, Cuff Size: Normal)   Pulse 70   Ht 4\' 9"  (1.448 m)   Wt 84 lb 8 oz (38.3 kg)   BMI 18.29 kg/m  , BMI Body mass index is 18.29 kg/m. Constitutional:  oriented to person, place, and time. No distress. thin and frail on nasal cannula oxygen 3 L HENT:  Head: Normocephalic and atraumatic.  Eyes:  no discharge. No scleral icterus.  Neck: Normal range of motion. Neck supple. No JVD present.  Cardiovascular: Normal rate, regular rhythm, normal heart sounds and intact distal pulses. Exam reveals no gallop and no friction rub. No edema No murmur heard. Pulmonary/Chest: moderately decreased breath sounds throughout, No stridor. No respiratory distress.  no  wheezes.  no rales.  no tenderness.  Abdominal: Soft.  no distension.  no tenderness.  Musculoskeletal: Normal range of motion.  no  tenderness or deformity.  Neurological:  normal muscle tone. Coordination normal. No atrophy Skin: Skin is warm and dry. No rash noted. not diaphoretic.  Psychiatric:  normal mood and affect. behavior is normal. Thought content normal.    Recent Labs: 01/07/2018: B Natriuretic Peptide 3,070.0 03/04/2018: ALT 29; BUN 27; Creat 0.67; Hemoglobin 13.1; Platelets 218; Potassium 4.0; Sodium 139; TSH 2.93    Lipid Panel Lab Results  Component Value Date   CHOL 202 (H) 03/04/2018   HDL 58 03/04/2018   LDLCALC 125 (H) 03/04/2018   TRIG 91 03/04/2018      Wt Readings from Last 3 Encounters:  08/18/18 84 lb 8 oz (38.3 kg)  06/10/18 86 lb (39 kg)  04/16/18 85 lb 6 oz (38.7 kg)     ASSESSMENT AND PLAN:  Chronic diastolic CHF (congestive heart failure) (HCC) For some reason she is taking torsemide 80 twice a day Likely medication error. She did not bring her boxes with her Otherwise she reports breathing is stable and she feels fine with no orthostasis symptoms We have ordered basic metabolic panel No changes made to her medications for  now  Abnormal EKG Dramatic change on today's EKG, diffuse T wave abnormality Prior stress test available,  No significant ischemia on prior stress test November 2018  Essential hypertension Blood pressure low  No changes made to the medications. No orthostasis symptoms stable  MITRAL REGURGITATION Moderate to severe MR directed eccentrically on echocardiogram January 2019 Continue aggressive diuretics Likely contributing to some shortness of breath Not a surgical candidate  gait instability No regular exercise program, no recent falls stable tremor    Total encounter time more than 25 minutes  Greater than 50% was spent in counseling and coordination of care with the patient   Disposition:   F/U  12 months    Signed, Esmond Plants, M.D., Ph.D. 08/18/2018  [place], [place] [phone]

## 2018-08-18 ENCOUNTER — Encounter: Payer: Self-pay | Admitting: Cardiovascular Disease

## 2018-08-18 ENCOUNTER — Ambulatory Visit (INDEPENDENT_AMBULATORY_CARE_PROVIDER_SITE_OTHER): Payer: PPO | Admitting: Cardiovascular Disease

## 2018-08-18 VITALS — BP 100/50 | HR 70 | Ht <= 58 in | Wt 84.5 lb

## 2018-08-18 DIAGNOSIS — J849 Interstitial pulmonary disease, unspecified: Secondary | ICD-10-CM | POA: Diagnosis not present

## 2018-08-18 DIAGNOSIS — I272 Pulmonary hypertension, unspecified: Secondary | ICD-10-CM | POA: Diagnosis not present

## 2018-08-18 DIAGNOSIS — I5032 Chronic diastolic (congestive) heart failure: Secondary | ICD-10-CM

## 2018-08-18 DIAGNOSIS — I10 Essential (primary) hypertension: Secondary | ICD-10-CM | POA: Diagnosis not present

## 2018-08-18 DIAGNOSIS — E7800 Pure hypercholesterolemia, unspecified: Secondary | ICD-10-CM

## 2018-08-18 DIAGNOSIS — I890 Lymphedema, not elsewhere classified: Secondary | ICD-10-CM | POA: Diagnosis not present

## 2018-08-18 NOTE — Patient Instructions (Addendum)
Medication Instructions:   Check the label on the torsemide dosing  Decrease the dose of the aspirin down to 81 daily  Labwork:  BMP today  Testing/Procedures:  No further testing at this time   Follow-Up: It was a pleasure seeing you in the office today. Please call us if you have new issues that need to be addressed before your next appt.  [phone]  Your physician wants you to follow-up in: 12 months.  You will receive a reminder letter in the mail two months in advance. If you don't receive a letter, please call our office to schedule the follow-up appointment.  If you need a refill on your cardiac medications before your next appointment, please call your pharmacy.  For educational health videos Log in to : [email or web address] Or : [email or web address], password : [email or web address]

## 2018-08-19 ENCOUNTER — Ambulatory Visit: Payer: PPO | Attending: Family | Admitting: Family

## 2018-08-19 ENCOUNTER — Encounter: Payer: Self-pay | Admitting: Family

## 2018-08-19 VITALS — BP 114/68 | HR 71 | Resp 18 | Ht <= 58 in | Wt 86.4 lb

## 2018-08-19 DIAGNOSIS — Z79899 Other long term (current) drug therapy: Secondary | ICD-10-CM | POA: Diagnosis not present

## 2018-08-19 DIAGNOSIS — I5032 Chronic diastolic (congestive) heart failure: Secondary | ICD-10-CM | POA: Diagnosis not present

## 2018-08-19 DIAGNOSIS — I890 Lymphedema, not elsewhere classified: Secondary | ICD-10-CM | POA: Insufficient documentation

## 2018-08-19 DIAGNOSIS — I110 Hypertensive heart disease with heart failure: Secondary | ICD-10-CM | POA: Diagnosis not present

## 2018-08-19 DIAGNOSIS — I340 Nonrheumatic mitral (valve) insufficiency: Secondary | ICD-10-CM | POA: Diagnosis not present

## 2018-08-19 DIAGNOSIS — K219 Gastro-esophageal reflux disease without esophagitis: Secondary | ICD-10-CM | POA: Insufficient documentation

## 2018-08-19 DIAGNOSIS — I10 Essential (primary) hypertension: Secondary | ICD-10-CM

## 2018-08-19 DIAGNOSIS — M5136 Other intervertebral disc degeneration, lumbar region: Secondary | ICD-10-CM | POA: Insufficient documentation

## 2018-08-19 DIAGNOSIS — G8929 Other chronic pain: Secondary | ICD-10-CM | POA: Diagnosis not present

## 2018-08-19 DIAGNOSIS — J449 Chronic obstructive pulmonary disease, unspecified: Secondary | ICD-10-CM | POA: Insufficient documentation

## 2018-08-19 DIAGNOSIS — E785 Hyperlipidemia, unspecified: Secondary | ICD-10-CM | POA: Diagnosis not present

## 2018-08-19 DIAGNOSIS — I272 Pulmonary hypertension, unspecified: Secondary | ICD-10-CM | POA: Insufficient documentation

## 2018-08-19 DIAGNOSIS — I341 Nonrheumatic mitral (valve) prolapse: Secondary | ICD-10-CM | POA: Insufficient documentation

## 2018-08-19 DIAGNOSIS — Z7982 Long term (current) use of aspirin: Secondary | ICD-10-CM | POA: Insufficient documentation

## 2018-08-19 DIAGNOSIS — Z880 Allergy status to penicillin: Secondary | ICD-10-CM | POA: Diagnosis not present

## 2018-08-19 LAB — BASIC METABOLIC PANEL
BUN / CREAT RATIO: 42 — AB (ref 12–28)
BUN: 27 mg/dL (ref 8–27)
CO2: 29 mmol/L (ref 20–29)
CREATININE: 0.64 mg/dL (ref 0.57–1.00)
Calcium: 9.3 mg/dL (ref 8.7–10.3)
Chloride: 99 mmol/L (ref 96–106)
GFR calc Af Amer: 94 mL/min/{1.73_m2} (ref >59)
GFR, EST NON AFRICAN AMERICAN: 82 mL/min/{1.73_m2} (ref >59)
GLUCOSE: 79 mg/dL (ref 65–99)
POTASSIUM: 5.1 mmol/L (ref 3.5–5.2)
SODIUM: 141 mmol/L (ref 134–144)

## 2018-08-19 NOTE — Progress Notes (Signed)
Patient ID: Jennifer Skinner, female    DOB: 26-Nov-1931, 85 y.o.   MRN: [id number]  HPI  Jennifer Skinner is a 84 y/o female who has a history of MV prolapse, COPD, HTN, GERD and HF with an EF of 60-65% who returns for follow-up today.    Echo report from 01/07/18 reviewed and showed an EF of 55-60% along with moderate/severe MR, moderate TR and severely elevated PA pressure of 70 mm Hg. Echo done 10/11/15 showed an EF of 60-65% with moderate mitral regurgitation but without aortic stenosis. Mild to moderate TR,  PASP 56mg  Hg.  Has not been admitted or been in the ED in the last 6 months.   Returns today for a follow-up visit with a chief complaint of moderate fatigue upon minimal exertion. She describes this as chronic in nature having been present for several years. She has associated light-headedness and chronic back pain along with this. She denies any difficulty sleeping, abdominal distention, palpitations, pedal edema, chest pain, shortness of breath, cough or dizziness. She has continued to take 80mg  torsemide BID and had lab work done at cardiology's office yesterday.   Past Medical History:  Diagnosis Date  . 1st degree AV block   . Arthritis   . Chronic diastolic CHF (congestive heart failure) ([place])    a. echo 2014: EF 55-60%, nl wall motion, GR1DD, mild MR, moder mitral prolapse, PASP 40 mm Hg; b. echo 09/2015: EF 60-65%, nl wall motion, GR1DD, mod MR, mild to mod TR, PASP 56 mm Hg  . COPD (chronic obstructive pulmonary disease) ([place])   . Degenerative disc disease, lumbar   . Gastro-esophageal reflux   . Hyperlipidemia   . Mitral prolapse   . Mitral regurgitation   . Osteoporosis    Past Surgical History:  Procedure Laterality Date  . ABDOMINAL HYSTERECTOMY    . APPENDECTOMY    . DILATION AND CURETTAGE OF UTERUS    . HYSTEROTOMY    . right breast biopsy    . right eye      surgery  . TONSILLECTOMY     Family History  Problem Relation Age of Onset  . CVA Mother 33  . Heart  attack Father 25  . Throat cancer Sister   . CAD Brother    Social History   Tobacco Use  . Smoking status: Never Smoker  . Smokeless tobacco: Never Used  Substance Use Topics  . Alcohol use: No    Alcohol/week: 0.0 standard drinks   Allergies  Allergen Reactions  . Penicillins     Rash    Prior to Admission medications   Medication Sig Start Date End Date Taking? Authorizing Provider  acetaminophen (TYLENOL) 500 MG tablet Take 500 mg by mouth every 6 (six) hours as needed.   Yes [provider]  albuterol (PROVENTIL) (2.5 MG/3ML) 0.083% nebulizer solution Take 3 mLs (2.5 mg total) by nebulization every 6 (six) hours as needed for wheezing or shortness of breath. 10/12/15  Yes Epifanio Lesches, MD  aspirin EC 81 MG tablet Take 1 tablet (81 mg total) by mouth daily. 08/18/18  Yes Gollan, Kathlene November, MD  baclofen (LIORESAL) 10 MG tablet Take 0.5-1 tablets (5-10 mg total) by mouth 3 (three) times daily as needed for muscle spasms. 08/19/17  Yes Karamalegos, Devonne Doughty, DO  Calcium Gluconate 500 MG CAPS Take 2 capsules by mouth daily.    Yes [provider]  gabapentin (NEURONTIN) 100 MG capsule Take 1 capsule (100 mg total) by  mouth 3 (three) times daily. 07/11/18  Yes Karamalegos, Devonne Doughty, DO  levothyroxine (SYNTHROID, LEVOTHROID) 25 MCG tablet Take 1 tablet (25 mcg total) by mouth daily before breakfast. Ran out of meds. 03/11/18  Yes Karamalegos, Devonne Doughty, DO  metoprolol tartrate (LOPRESSOR) 25 MG tablet Take 0.5 tablets (12.5 mg total) by mouth 2 (two) times daily. 06/19/18  Yes Karamalegos, Devonne Doughty, DO  mirabegron ER (MYRBETRIQ) 25 MG TB24 tablet Take 1 tablet (25 mg total) by mouth daily. 06/10/18  Yes Karamalegos, Devonne Doughty, DO  Multiple Vitamin (MULTI-VITAMIN DAILY PO) Take 1 tablet by mouth daily.    Yes [provider]  [name] (OYSTER CALCIUM) 500 MG TABS Take 500 mg of elemental calcium by mouth 2 (two) times daily.   Yes [provider]  polyethylene glycol powder (GLYCOLAX/MIRALAX) powder Take 17-34 g by mouth daily as needed. 08/19/17  Yes Karamalegos, Devonne Doughty, DO  potassium chloride (K-DUR) 10 MEQ tablet Take 1 tablet (10 mEq total) by mouth 4 (four) times daily. Patient taking differently: Take 10 mEq by mouth 5 (five) times daily.  08/04/18  Yes Darylene Price A, FNP  rosuvastatin (CRESTOR) 10 MG tablet Take 1 tablet (10 mg total) by mouth at bedtime. 03/11/18  Yes Karamalegos, Devonne Doughty, DO  sildenafil (REVATIO) 20 MG tablet Take 1 tablet (20 mg total) by mouth 3 (three) times daily. For pulmonary hypertension 07/21/18  Yes Karamalegos, Devonne Doughty, DO  torsemide (DEMADEX) 20 MG tablet Take 2 tablets (40 mg total) by mouth 2 (two) times daily. Patient taking differently: Take 80 mg by mouth 2 (two) times daily.  07/21/18 08/20/18 Yes Hackney, Aura Fey, FNP  traMADol (ULTRAM) 50 MG tablet Take 1 tablet by mouth every 6 (six) hours as needed for moderate pain.  08/23/14  Yes [provider]    Review of Systems  Constitutional: Positive for fatigue. Negative for appetite change.  HENT: Negative for congestion, postnasal drip and sore throat.   Eyes: Negative.   Respiratory: Negative for cough, chest tightness and shortness of breath.   Cardiovascular: Negative for chest pain, palpitations and leg swelling.  Gastrointestinal: Negative for abdominal distention and abdominal pain.  Endocrine: Negative.   Genitourinary: Negative.   Musculoskeletal: Positive for back pain. Negative for neck pain.  Skin: Negative.   Allergic/Immunologic: Negative.   Neurological: Positive for light-headedness (with sudden position changes). Negative for dizziness and weakness.  Hematological: Negative for adenopathy. Does not bruise/bleed easily.  Psychiatric/Behavioral: Negative for dysphoric mood and sleep disturbance (sleeping on 1 pillow). The patient is not nervous/anxious.    Vitals:   08/19/18 1128  BP: 114/68   Pulse: 71  Resp: 18  SpO2: 91%  Weight: 86 lb 6 oz (39.2 kg)  Height: 4\' 8"  (1.422 m)   Wt Readings from Last 3 Encounters:  08/19/18 86 lb 6 oz (39.2 kg)  08/18/18 84 lb 8 oz (38.3 kg)  06/10/18 86 lb (39 kg)   Lab Results  Component Value Date   CREATININE 0.64 08/18/2018   CREATININE 0.67 03/04/2018   CREATININE 0.55 01/10/2018    Physical Exam  Constitutional: She is oriented to person, place, and time. She appears well-developed and well-nourished.  HENT:  Head: Normocephalic and atraumatic.  Neck: Normal range of motion. Neck supple. No JVD present.  Cardiovascular: Normal rate and regular rhythm.  Pulmonary/Chest: Effort normal. She has no wheezes. She has no rales.  Abdominal: Soft. She exhibits no distension. There is no tenderness.  Musculoskeletal: She  exhibits no edema or tenderness.  Neurological: She is alert and oriented to person, place, and time.  Skin: Skin is warm and dry.  Psychiatric: She has a normal mood and affect. Her behavior is normal. Thought content normal.  Nursing note and vitals reviewed.   Assessment & Plan:  1: Chronic heart failure with preserved ejection fraction- - NYHA Class III - euvolemic today - Not adding salt to her food/reading food labels some - Continues to weigh daily. Reminded to call for an overnight weight gain of >2 pounds or weekly weight gain of >5 pounds - weight stable from last visit here 4 months ago - Saw cardiologist Rockey Situ) 08/18/18 - BNP on 01/07/18 reviewed and was 3070.0  2: HTN- - BP looks good today although on the low side - will decrease her torsemide to 40mg  BID (was 80mg  BID) along with decreasing potassium to 3 tablets daily - will get BMP at her next office visit - Saw PCP Parks Ranger) on 03/11/18 - BMP from 08/18/18 reviewed and showed sodium 141, potassium 5.1, creatinine 0.64 and GFR 82  3: Lymphedema- - stage 2 - doesn't elevate her legs much and she was encouraged to elevate her legs when  she's sitting for long periods - wearing support socks daily and currently has no edema - unable to exercise due to severe kyphosis and chronic back pain  4: Pulmonary HTN- - saw pulmonologist Raul Del) 07/23/18 - wearing oxygent at 2L around the clock and checking her oxygen levels at home  Patient did not bring her medications nor a list. Each medication was verbally reviewed with the patient and she was encouraged to bring the bottles to every visit to confirm accuracy of list.  Return in 1 month or sooner for any questions/problems before then.

## 2018-08-19 NOTE — Patient Instructions (Addendum)
Continue weighing daily and call for an overnight weight gain of > 2 pounds or a weekly weight gain of >5 pounds.   Decrease torsemide (fluid pill) to 2 tablets twice daily.  Decrease potassium tablets to 3 daily.

## 2018-09-13 DIAGNOSIS — I272 Pulmonary hypertension, unspecified: Secondary | ICD-10-CM | POA: Diagnosis not present

## 2018-09-21 NOTE — Progress Notes (Signed)
Patient ID: Jennifer Skinner, female    DOB: [birth year], 84 y.o.   MRN: [id number]  HPI  Jennifer Skinner is a 84 y/o female who has a history of MV prolapse, COPD, HTN, GERD and HF with an EF of 60-65% who returns for follow-up today.    Echo report from 01/07/18 reviewed and showed an EF of 55-60% along with moderate/severe MR, moderate TR and severely elevated PA pressure of 70 mm Hg. Echo done 10/11/15 showed an EF of 60-65% with moderate mitral regurgitation but without aortic stenosis. Mild to moderate TR,  PASP 56mg  Hg.  Has not been admitted or been in the ED in the last 6 months.   Returns today for a follow-up visit with a chief complaint of moderate fatigue upon minimal exertion. She describes this as chronic in nature having been present for several years. She has associated light-headedness and chronic back pain along with this. She denies any difficulty sleeping, abdominal distention, palpitations, pedal edema, chest pain, shortness of breath or weight gain.   Past Medical History:  Diagnosis Date  . 1st degree AV block   . Arthritis   . Chronic diastolic CHF (congestive heart failure) ([place])    a. echo 2014: EF 55-60%, nl wall motion, GR1DD, mild MR, moder mitral prolapse, PASP 40 mm Hg; b. echo 09/2015: EF 60-65%, nl wall motion, GR1DD, mod MR, mild to mod TR, PASP 56 mm Hg  . COPD (chronic obstructive pulmonary disease) ([place])   . Degenerative disc disease, lumbar   . Gastro-esophageal reflux   . Hyperlipidemia   . Mitral prolapse   . Mitral regurgitation   . Osteoporosis    Past Surgical History:  Procedure Laterality Date  . ABDOMINAL HYSTERECTOMY    . APPENDECTOMY    . DILATION AND CURETTAGE OF UTERUS    . HYSTEROTOMY    . right breast biopsy    . right eye      surgery  . TONSILLECTOMY     Family History  Problem Relation Age of Onset  . CVA Mother 7  . Heart attack Father 53  . Throat cancer Sister   . CAD Brother    Social History   Tobacco Use  . Smoking  status: Never Smoker  . Smokeless tobacco: Never Used  Substance Use Topics  . Alcohol use: No    Alcohol/week: 0.0 standard drinks   Allergies  Allergen Reactions  . Penicillins     Rash    Prior to Admission medications   Medication Sig Start Date End Date Taking? Authorizing Provider  acetaminophen (TYLENOL) 500 MG tablet Take 500 mg by mouth every 6 (six) hours as needed.   Yes [provider]  albuterol (PROVENTIL) (2.5 MG/3ML) 0.083% nebulizer solution Take 3 mLs (2.5 mg total) by nebulization every 6 (six) hours as needed for wheezing or shortness of breath. 10/12/15  Yes Epifanio Lesches, MD  aspirin EC 81 MG tablet Take 1 tablet (81 mg total) by mouth daily. 08/18/18  Yes Gollan, Kathlene November, MD  baclofen (LIORESAL) 10 MG tablet Take 0.5-1 tablets (5-10 mg total) by mouth 3 (three) times daily as needed for muscle spasms. 08/19/17  Yes Karamalegos, Devonne Doughty, DO  Calcium Gluconate 500 MG CAPS Take 2 capsules by mouth daily.    Yes [provider]  gabapentin (NEURONTIN) 100 MG capsule Take 1 capsule (100 mg total) by mouth 3 (three) times daily. 07/11/18  Yes Karamalegos, Devonne Doughty, DO  levothyroxine (SYNTHROID, LEVOTHROID) 25  MCG tablet Take 1 tablet (25 mcg total) by mouth daily before breakfast. Ran out of meds. 03/11/18  Yes Karamalegos, Devonne Doughty, DO  metoprolol tartrate (LOPRESSOR) 25 MG tablet Take 0.5 tablets (12.5 mg total) by mouth 2 (two) times daily. 06/19/18  Yes Karamalegos, Devonne Doughty, DO  mirabegron ER (MYRBETRIQ) 25 MG TB24 tablet Take 1 tablet (25 mg total) by mouth daily. 06/10/18  Yes Karamalegos, Devonne Doughty, DO  Multiple Vitamin (MULTI-VITAMIN DAILY PO) Take 1 tablet by mouth daily.    Yes [provider]  [name] (OYSTER CALCIUM) 500 MG TABS Take 500 mg of elemental calcium by mouth 2 (two) times daily.   Yes [provider]  polyethylene glycol powder (GLYCOLAX/MIRALAX) powder Take 17-34 g by mouth daily as needed.  08/19/17  Yes Karamalegos, Devonne Doughty, DO  potassium chloride (K-DUR,KLOR-CON) 10 MEQ tablet Take 10 mEq by mouth 3 (three) times daily.   Yes [provider]  rosuvastatin (CRESTOR) 10 MG tablet Take 1 tablet (10 mg total) by mouth at bedtime. 03/11/18  Yes Karamalegos, Devonne Doughty, DO  sildenafil (REVATIO) 20 MG tablet Take 1 tablet (20 mg total) by mouth 3 (three) times daily. For pulmonary hypertension 07/21/18  Yes Karamalegos, Devonne Doughty, DO  traMADol (ULTRAM) 50 MG tablet Take 1 tablet by mouth every 6 (six) hours as needed for moderate pain.  08/23/14  Yes [provider]  torsemide (DEMADEX) 20 MG tablet Take 2 tablets (40 mg total) by mouth 2 (two) times daily. 07/21/18 08/20/18  Alisa Graff, FNP    Review of Systems  Constitutional: Positive for fatigue. Negative for appetite change.  HENT: Positive for postnasal drip. Negative for congestion and sore throat.   Eyes: Negative.   Respiratory: Negative for cough, chest tightness and shortness of breath.   Cardiovascular: Negative for chest pain, palpitations and leg swelling.  Gastrointestinal: Negative for abdominal distention and abdominal pain.  Endocrine: Negative.   Genitourinary: Negative.   Musculoskeletal: Positive for back pain. Negative for neck pain.  Skin: Negative.   Allergic/Immunologic: Negative.   Neurological: Positive for light-headedness (with sudden position changes). Negative for dizziness and weakness.  Hematological: Negative for adenopathy. Does not bruise/bleed easily.  Psychiatric/Behavioral: Negative for dysphoric mood and sleep disturbance (sleeping on 1 pillow). The patient is not nervous/anxious.    Vitals:   09/23/18 1151  BP: (!) 112/53  Pulse: 68  Resp: 18  SpO2: 96%  Weight: 87 lb 2 oz (39.5 kg)  Height: 4\' 8"  (1.422 m)   Wt Readings from Last 3 Encounters:  09/23/18 87 lb 2 oz (39.5 kg)  08/19/18 86 lb 6 oz (39.2 kg)  08/18/18 84 lb 8 oz (38.3 kg)   Lab Results   Component Value Date   CREATININE 0.51 09/23/2018   CREATININE 0.64 08/18/2018   CREATININE 0.67 03/04/2018    Physical Exam  Constitutional: She is oriented to person, place, and time. She appears well-developed and well-nourished.  HENT:  Head: Normocephalic and atraumatic.  Neck: Normal range of motion. Neck supple. No JVD present.  Cardiovascular: Normal rate and regular rhythm.  Pulmonary/Chest: Effort normal. She has no wheezes. She has no rales.  Abdominal: Soft. She exhibits no distension. There is no tenderness.  Musculoskeletal: She exhibits no edema or tenderness.  Neurological: She is alert and oriented to person, place, and time.  Skin: Skin is warm and dry.  Psychiatric: She has a normal mood and affect. Her behavior is normal. Thought content normal.  Nursing  note and vitals reviewed.   Assessment & Plan:  1: Chronic heart failure with preserved ejection fraction- - NYHA Class III - euvolemic today - Not adding salt to her food/reading food labels some - Continues to weigh daily. Reminded to call for an overnight weight gain of >2 pounds or weekly weight gain of >5 pounds - weight unchanged from last visit 1 month ago - Saw cardiologist Rockey Situ) 08/18/18 - BNP on 01/07/18 reviewed and was 3070.0 - patient hasn't received her flu vaccine yet  2: HTN- - BP looks good today - at last visit torsemide was decreased to 40mg  BID (was 80mg  BID) along with decreasing potassium to 3 tablets daily - will get BMP today since torsemide/potassium were decreased at last visit  - Saw PCP Parks Ranger) on 03/11/18 - BMP from 08/18/18 reviewed and showed sodium 141, potassium 5.1, creatinine 0.64 and GFR 82  3: Lymphedema- - stage 2 - doesn't elevate her legs much and she was encouraged to elevate her legs when she's sitting for long periods - wearing support socks daily and currently has no edema - unable to exercise due to severe kyphosis and chronic back pain  4: Pulmonary  HTN- - saw pulmonologist Raul Del) 07/23/18 - wearing oxygent at 3L around the clock and checking her oxygen levels at home  Patient did not bring her medications nor a list. Each medication was verbally reviewed with the patient and she was encouraged to bring the bottles to every visit to confirm accuracy of list.  Return in 6 months or sooner for any questions/problems before then.

## 2018-09-23 ENCOUNTER — Encounter: Payer: Self-pay | Admitting: Family

## 2018-09-23 ENCOUNTER — Ambulatory Visit: Payer: PPO | Admitting: Family

## 2018-09-23 ENCOUNTER — Ambulatory Visit: Payer: PPO | Attending: Family | Admitting: Family

## 2018-09-23 VITALS — BP 112/53 | HR 68 | Resp 18 | Ht <= 58 in | Wt 87.1 lb

## 2018-09-23 DIAGNOSIS — I110 Hypertensive heart disease with heart failure: Secondary | ICD-10-CM | POA: Insufficient documentation

## 2018-09-23 DIAGNOSIS — E785 Hyperlipidemia, unspecified: Secondary | ICD-10-CM | POA: Diagnosis not present

## 2018-09-23 DIAGNOSIS — J449 Chronic obstructive pulmonary disease, unspecified: Secondary | ICD-10-CM | POA: Diagnosis not present

## 2018-09-23 DIAGNOSIS — I340 Nonrheumatic mitral (valve) insufficiency: Secondary | ICD-10-CM | POA: Diagnosis not present

## 2018-09-23 DIAGNOSIS — M5136 Other intervertebral disc degeneration, lumbar region: Secondary | ICD-10-CM | POA: Diagnosis not present

## 2018-09-23 DIAGNOSIS — I890 Lymphedema, not elsewhere classified: Secondary | ICD-10-CM | POA: Diagnosis not present

## 2018-09-23 DIAGNOSIS — K219 Gastro-esophageal reflux disease without esophagitis: Secondary | ICD-10-CM | POA: Diagnosis not present

## 2018-09-23 DIAGNOSIS — I272 Pulmonary hypertension, unspecified: Secondary | ICD-10-CM | POA: Diagnosis not present

## 2018-09-23 DIAGNOSIS — Z79899 Other long term (current) drug therapy: Secondary | ICD-10-CM | POA: Diagnosis not present

## 2018-09-23 DIAGNOSIS — I5032 Chronic diastolic (congestive) heart failure: Secondary | ICD-10-CM | POA: Diagnosis not present

## 2018-09-23 DIAGNOSIS — Z880 Allergy status to penicillin: Secondary | ICD-10-CM | POA: Diagnosis not present

## 2018-09-23 DIAGNOSIS — Z7982 Long term (current) use of aspirin: Secondary | ICD-10-CM | POA: Insufficient documentation

## 2018-09-23 DIAGNOSIS — I10 Essential (primary) hypertension: Secondary | ICD-10-CM

## 2018-09-23 LAB — BASIC METABOLIC PANEL
ANION GAP: 7 (ref 5–15)
BUN: 25 mg/dL — ABNORMAL HIGH (ref 8–23)
CO2: 31 mmol/L (ref 22–32)
Calcium: 9.2 mg/dL (ref 8.9–10.3)
Chloride: 99 mmol/L (ref 98–111)
Creatinine, Ser: 0.51 mg/dL (ref 0.44–1.00)
GFR calc Af Amer: >60 mL/min (ref >60)
GFR calc non Af Amer: >60 mL/min (ref >60)
GLUCOSE: 91 mg/dL (ref 70–99)
POTASSIUM: 4.8 mmol/L (ref 3.5–5.1)
Sodium: 137 mmol/L (ref 135–145)

## 2018-09-23 NOTE — Patient Instructions (Signed)
Continue weighing daily and call for an overnight weight gain of > 2 pounds or a weekly weight gain of >5 pounds. 

## 2018-09-24 ENCOUNTER — Encounter: Payer: Self-pay | Admitting: Family

## 2018-10-07 ENCOUNTER — Ambulatory Visit: Payer: PPO | Admitting: Family

## 2018-10-14 DIAGNOSIS — I272 Pulmonary hypertension, unspecified: Secondary | ICD-10-CM | POA: Diagnosis not present

## 2018-10-21 ENCOUNTER — Ambulatory Visit: Payer: PPO | Admitting: Family Medicine

## 2018-10-23 ENCOUNTER — Ambulatory Visit (INDEPENDENT_AMBULATORY_CARE_PROVIDER_SITE_OTHER): Payer: PPO | Admitting: Family Medicine

## 2018-10-23 ENCOUNTER — Other Ambulatory Visit: Payer: Self-pay | Admitting: Family Medicine

## 2018-10-23 ENCOUNTER — Encounter: Payer: Self-pay | Admitting: Family Medicine

## 2018-10-23 VITALS — BP 110/80 | HR 82 | Resp 15 | Ht <= 58 in | Wt 88.0 lb

## 2018-10-23 DIAGNOSIS — Z0000 Encounter for general adult medical examination without abnormal findings: Secondary | ICD-10-CM

## 2018-10-23 DIAGNOSIS — M5136 Other intervertebral disc degeneration, lumbar region: Secondary | ICD-10-CM | POA: Diagnosis not present

## 2018-10-23 DIAGNOSIS — I5032 Chronic diastolic (congestive) heart failure: Secondary | ICD-10-CM

## 2018-10-23 DIAGNOSIS — M545 Low back pain, unspecified: Secondary | ICD-10-CM

## 2018-10-23 DIAGNOSIS — G8929 Other chronic pain: Secondary | ICD-10-CM

## 2018-10-23 DIAGNOSIS — R7309 Other abnormal glucose: Secondary | ICD-10-CM

## 2018-10-23 DIAGNOSIS — K219 Gastro-esophageal reflux disease without esophagitis: Secondary | ICD-10-CM

## 2018-10-23 DIAGNOSIS — E43 Unspecified severe protein-calorie malnutrition: Secondary | ICD-10-CM

## 2018-10-23 DIAGNOSIS — J410 Simple chronic bronchitis: Secondary | ICD-10-CM

## 2018-10-23 DIAGNOSIS — I272 Pulmonary hypertension, unspecified: Secondary | ICD-10-CM

## 2018-10-23 DIAGNOSIS — E7800 Pure hypercholesterolemia, unspecified: Secondary | ICD-10-CM

## 2018-10-23 DIAGNOSIS — E034 Atrophy of thyroid (acquired): Secondary | ICD-10-CM

## 2018-10-23 DIAGNOSIS — I10 Essential (primary) hypertension: Secondary | ICD-10-CM

## 2018-10-23 LAB — POCT GLYCOSYLATED HEMOGLOBIN (HGB A1C): HEMOGLOBIN A1C: 5.8 % — AB (ref 4.0–5.6)

## 2018-10-23 NOTE — Assessment & Plan Note (Addendum)
Still improved wt gain up 2 lbs in 2 months Consistent clinically with muscle wasting, multiple chronic co morbid conditions  Encourage continue balanced diet, high protein / calorie  Additionally - discussed may benefit from palliative care in future if still comorbid problems from her chronic conditions, for symptom management - she will consider  Follow-up

## 2018-10-23 NOTE — Assessment & Plan Note (Signed)
Clinicaly with persistent LBP with muscle hypertonicity and joint pain, some radiating / neuropathic pain - Previously followed by pain management, now no longer  Plan 1. INCREASE Tylenol Ext Str 500 TID up to 1000mg  TID - can start high dose then reduce and intermittent dosing per flares 2. Continue Tramadol 50mg  QID - will refill when she requests, transfer rx from Dr Sharlet Salina to our office 3. Continue Gabapentin 100mg  TID - Advised her that if pain is limiting her function, she may adjust her meds - may choose one at a time, Tramadol or Gabapenitn, and slowly titrate dose up, likely Tramadol inc in AM and Gabapentin inc in PM - see AVS - Notify office if dosing question or need specific refill based on new dosing  Follow-up PRN

## 2018-10-23 NOTE — Patient Instructions (Addendum)
Thank you for coming to the office today.  Recommend to start taking Tylenol Extra Strength 500mg  tabs - take 2 tabs per dose (max 1000mg ) every 6-8 hours for pain, max 24 hour daily dose is 6 tablets or 3000mg  - Try this higher dose for 1 week - Then may reduce back to 500mg  3 times a day, and you can add an extra 1 to 2 pills whenever you need for flare up  Try to INCREASE Tramadol from one pill - 4 times a day, to adding a 2nd pill in morning - so take two pills in AM for 100mg , and then 1 pill with your other 3 doses in the day. See if this helps, if it does, call me and let me know what dose you are taking.  Also can try this with Gabapentin if prefer, add an extra pill in either morning or afternoon or evening, and see if it helps more.  If you decide to try the muscle relaxant Baclofen, let me know if you need more.  Please call Darylene Price FNP to request a refill of Torsemide when ready.  Please schedule a Follow-up Appointment to: Return in about 6 months (around 04/23/2019) for Annual Physical.  If you have any other questions or concerns, please feel free to call the office or send a message through [place]. You may also schedule an earlier appointment if necessary.  Additionally, you may be receiving a survey about your experience at our office within a few days to 1 week by e-mail or mail. We value your feedback.  Nobie Putnam, DO [place]

## 2018-10-23 NOTE — Assessment & Plan Note (Signed)
Improved A1c to 5.8 Diet is balanced, limited DM diet due to nutrition difficulty Advised to not limit her intake due to sugar concerns at this time

## 2018-10-23 NOTE — Progress Notes (Signed)
Subjective:    Patient ID: Jennifer Skinner, female    DOB: [birth year], 84 y.o.   MRN: [id number]  DANYAL ADORNO is a 84 y.o. female presenting on 10/23/2018 for Follow-up (Pre- Dm, COPD/ CHF and chronic back pain)  Accompanied by husband, Richetta Cubillos.  HPI   FOLLOW-UP Chronic Back Pain / DDD lumbar spine - Last visit with me 06/2018, for same problem, treated with continued current Tramadol rx, previously followed and rx by Dr Sharlet Salina at [facility], see prior notes for background information. - Interval update with she now plans to follow with me for pain and no longer interested in returning to Dr Sharlet Salina, not intending to get any injections or other therapy - Today patient reports persistent chronic pain, waking her at night at times, difficulty getting up in AM, generalized stiffness in AM, taking medicine helps her function better, but still does not resolve it - Taking Tramadol 50mg  QID, tolerating well - Taking Tylenol Ext Str 500mg  TID with good results. She was hesitant to take more. - Taking Gabapentin 100mg  TID  Protein Calorie Malnutrition,severe Today reports she is gradually improving appetite. Weight gain x 2 lbs in past 2 months. Not always adhering to protein supplement. She checks wt  Pre-Diabetes Last A1c trend 6.3 to 6.2. Now today 5.8 Not checking sugar Meds: Not on med No significant change in diet, mostly balanced. Denies hypoglycemia, polyuria, visual changes, numbness or tingling.  Health Maintenance: UTD Flu Vaccine 10/07/18  Depression screen [facility] 2/9 09/23/2018 08/19/2018 06/10/2018  Decreased Interest 0 0 0  Down, Depressed, Hopeless 0 0 0  PHQ - 2 Score 0 0 0  Altered sleeping - - -  Tired, decreased energy - - -  Change in appetite - - -  Feeling bad or failure about yourself  - - -  Trouble concentrating - - -  Moving slowly or fidgety/restless - - -  Suicidal thoughts - - -  PHQ-9 Score - - -  Difficult doing work/chores - - -     Social History   Tobacco Use  . Smoking status: Never Smoker  . Smokeless tobacco: Never Used  Substance Use Topics  . Alcohol use: No    Alcohol/week: 0.0 standard drinks  . Drug use: No    Review of Systems Per HPI unless specifically indicated above     Objective:    BP 110/80 (BP Location: Left Arm, Patient Position: Sitting, Cuff Size: Small)   Pulse 82   Resp 15   Ht 4\' 8"  (1.422 m)   Wt 88 lb (39.9 kg)   SpO2 93%   BMI 19.73 kg/m   Wt Readings from Last 3 Encounters:  10/23/18 88 lb (39.9 kg)  09/23/18 87 lb 2 oz (39.5 kg)  08/19/18 86 lb 6 oz (39.2 kg)    Physical Exam  Constitutional: She is oriented to person, place, and time. She appears well-developed and well-nourished. No distress.  Thin appearing 84 year old chronically ill female, comfortable and pleasant, cooperative  HENT:  Head: Normocephalic and atraumatic.  Mouth/Throat: Oropharynx is clear and moist.  Supplemental 3L O2 on via [place] portable o2  Eyes: Pupils are equal, round, and reactive to light. Conjunctivae and EOM are normal. Right eye exhibits no discharge. Left eye exhibits no discharge.  Neck: Normal range of motion. Neck supple.  Cardiovascular: Normal rate, regular rhythm and intact distal pulses.  Pulmonary/Chest: Effort normal. No respiratory distress. She has no wheezes. She has no  rales.  Mild reduced air movement at baseline, otherwise decent air movement today, speaks full sentences, non focal. No wheezing or crackles  Abdominal: Soft. Bowel sounds are normal. She exhibits no distension and no mass. There is no tenderness.  Musculoskeletal: Normal range of motion. She exhibits no edema or tenderness.  Stable since last visit, still with muscle wasting atrophy reduced bulk in upper body extremities and face temples bilaterally  Upper / Lower Extremities: - Normal strength bilateral upper extremities 5/5, lower extremities 5/5  Lymphadenopathy:    She has no cervical adenopathy.   Neurological: She is alert and oriented to person, place, and time.  Distal sensation intact to light touch all extremities  Skin: Skin is warm and dry. No rash noted. She is not diaphoretic. No erythema.  Psychiatric: She has a normal mood and affect. Her behavior is normal.  Well groomed, good eye contact, normal speech and thoughts  Nursing note and vitals reviewed.    Recent Labs    01/07/18 2105 03/04/18 0803 10/23/18 1058  HGBA1C 6.3* 6.2* 5.8*     Results for orders placed or performed in visit on 10/23/18  POCT glycosylated hemoglobin (Hb A1C)  Result Value Ref Range   Hemoglobin A1C 5.8 (A) 4.0 - 5.6 %      Assessment & Plan:   Problem List Items Addressed This Visit    Chronic back pain - Primary (Chronic)    Clinicaly with persistent LBP with muscle hypertonicity and joint pain, some radiating / neuropathic pain - Previously followed by pain management, now no longer  Plan 1. INCREASE Tylenol Ext Str 500 TID up to 1000mg  TID - can start high dose then reduce and intermittent dosing per flares 2. Continue Tramadol 50mg  QID - will refill when she requests, transfer rx from Dr Sharlet Salina to our office 3. Continue Gabapentin 100mg  TID - Advised her that if pain is limiting her function, she may adjust her meds - may choose one at a time, Tramadol or Gabapenitn, and slowly titrate dose up, likely Tramadol inc in AM and Gabapentin inc in PM - see AVS - Notify office if dosing question or need specific refill based on new dosing  Follow-up PRN      Degeneration of intervertebral disc of lumbar region   Elevated hemoglobin A1c    Improved A1c to 5.8 Diet is balanced, limited DM diet due to nutrition difficulty Advised to not limit her intake due to sugar concerns at this time      Relevant Orders   POCT glycosylated hemoglobin (Hb A1C) (Completed)   Protein-calorie malnutrition, severe (HCC)    Still improved wt gain up 2 lbs in 2 months Consistent clinically with  muscle wasting, multiple chronic co morbid conditions  Encourage continue balanced diet, high protein / calorie  Additionally - discussed may benefit from palliative care in future if still comorbid problems from her chronic conditions, for symptom management - she will consider  Follow-up         No orders of the defined types were placed in this encounter.   Follow up plan: Return in about 6 months (around 04/23/2019) for Annual Physical.  Future labs ordered for 04/21/19  Nobie Putnam, [facility] 10/23/2018, 11:00 AM

## 2018-10-28 DIAGNOSIS — D170 Benign lipomatous neoplasm of skin and subcutaneous tissue of head, face and neck: Secondary | ICD-10-CM | POA: Diagnosis not present

## 2018-11-13 DIAGNOSIS — I272 Pulmonary hypertension, unspecified: Secondary | ICD-10-CM | POA: Diagnosis not present

## 2018-12-14 DIAGNOSIS — I272 Pulmonary hypertension, unspecified: Secondary | ICD-10-CM | POA: Diagnosis not present

## 2018-12-16 ENCOUNTER — Other Ambulatory Visit: Payer: Self-pay | Admitting: Family Medicine

## 2018-12-16 DIAGNOSIS — I272 Pulmonary hypertension, unspecified: Secondary | ICD-10-CM

## 2018-12-30 DIAGNOSIS — Z9981 Dependence on supplemental oxygen: Secondary | ICD-10-CM | POA: Diagnosis not present

## 2018-12-30 DIAGNOSIS — J849 Interstitial pulmonary disease, unspecified: Secondary | ICD-10-CM | POA: Diagnosis not present

## 2018-12-30 DIAGNOSIS — R05 Cough: Secondary | ICD-10-CM | POA: Diagnosis not present

## 2018-12-30 DIAGNOSIS — J310 Chronic rhinitis: Secondary | ICD-10-CM | POA: Diagnosis not present

## 2019-01-08 ENCOUNTER — Other Ambulatory Visit: Payer: Self-pay | Admitting: Family Medicine

## 2019-01-08 DIAGNOSIS — M5416 Radiculopathy, lumbar region: Secondary | ICD-10-CM

## 2019-01-08 DIAGNOSIS — M47817 Spondylosis without myelopathy or radiculopathy, lumbosacral region: Secondary | ICD-10-CM

## 2019-01-08 DIAGNOSIS — M5136 Other intervertebral disc degeneration, lumbar region: Secondary | ICD-10-CM

## 2019-01-14 DIAGNOSIS — I272 Pulmonary hypertension, unspecified: Secondary | ICD-10-CM | POA: Diagnosis not present

## 2019-02-11 ENCOUNTER — Telehealth: Payer: Self-pay | Admitting: Family Medicine

## 2019-02-11 DIAGNOSIS — M48062 Spinal stenosis, lumbar region with neurogenic claudication: Secondary | ICD-10-CM

## 2019-02-11 DIAGNOSIS — M545 Low back pain: Secondary | ICD-10-CM

## 2019-02-11 DIAGNOSIS — G8929 Other chronic pain: Secondary | ICD-10-CM

## 2019-02-11 MED ORDER — TRAMADOL HCL 50 MG PO TABS: 50.0000 mg | ORAL_TABLET | Freq: Four times a day (QID) | ORAL | 2 refills | Status: DC | PRN

## 2019-02-11 NOTE — Telephone Encounter (Signed)
Last seen 10/2018, discussed her chronic pain, tramadol, previously followed by Dr Sharlet Salina at [facility] for long-term tramadol management with good results, now we have agreed to transition her rx to our office for future refills as previously discussed.  Checked PMP AWARE past 2 years of controlled rx today 02/11/19, appropriate no red flags  Will refill at same dosage Tramadol 50mg  QID PRN pain, #120 per 30 day with 2 refills  Please let patient know that I have refilled her Tramadol at [facility] - 1 month supply with 2 additional refills. We can review her rx at next visit as well.  Nobie Putnam, [facility] 02/11/2019, 3:19 PM

## 2019-02-11 NOTE — Telephone Encounter (Signed)
Patient advised.

## 2019-02-11 NOTE — Telephone Encounter (Signed)
Pt asked for a prescription for tramadol since she is not seeing Dr. Sharlet Salina anymore.  Please call [phone]

## 2019-02-12 DIAGNOSIS — I272 Pulmonary hypertension, unspecified: Secondary | ICD-10-CM | POA: Diagnosis not present

## 2019-03-10 ENCOUNTER — Other Ambulatory Visit: Payer: Self-pay | Admitting: Family Medicine

## 2019-03-10 DIAGNOSIS — E034 Atrophy of thyroid (acquired): Secondary | ICD-10-CM

## 2019-03-15 DIAGNOSIS — R4182 Altered mental status, unspecified: Secondary | ICD-10-CM | POA: Diagnosis not present

## 2019-03-15 DIAGNOSIS — I272 Pulmonary hypertension, unspecified: Secondary | ICD-10-CM | POA: Diagnosis not present

## 2019-03-15 DIAGNOSIS — I499 Cardiac arrhythmia, unspecified: Secondary | ICD-10-CM | POA: Diagnosis not present

## 2019-03-15 DIAGNOSIS — S2222XA Fracture of body of sternum, initial encounter for closed fracture: Secondary | ICD-10-CM | POA: Diagnosis not present

## 2019-03-15 DIAGNOSIS — E860 Dehydration: Secondary | ICD-10-CM | POA: Diagnosis not present

## 2019-03-15 DIAGNOSIS — Z1231 Encounter for screening mammogram for malignant neoplasm of breast: Secondary | ICD-10-CM | POA: Diagnosis not present

## 2019-03-24 ENCOUNTER — Ambulatory Visit: Payer: PPO | Admitting: Family

## 2019-04-14 DIAGNOSIS — I272 Pulmonary hypertension, unspecified: Secondary | ICD-10-CM | POA: Diagnosis not present

## 2019-04-21 ENCOUNTER — Other Ambulatory Visit: Payer: PPO

## 2019-04-28 ENCOUNTER — Ambulatory Visit: Payer: PPO | Admitting: Family Medicine

## 2019-05-06 ENCOUNTER — Telehealth: Payer: Self-pay | Admitting: Family Medicine

## 2019-05-06 NOTE — Telephone Encounter (Signed)
@  [facility]   Outreach Note  05/06/2019 Name: Jennifer Skinner MRN: [id number] DOB: 05/01/1931  Referred by: Olin Hauser, DO Reason for referral : Chronic Care Management (Initial CCM outreach call was unsuccessful. )   An unsuccessful telephone outreach was attempted today. The patient was referred to the case management team by for assistance with chronic care management and care coordination.   Follow Up Plan: A HIPPA compliant phone message was left for the patient providing contact information and requesting a return call.  The CM team will reach out to the patient again over the next 7 days.  If patient returns call to provider office, please advise to call [place] at [place]  [email or web address]   ??[phone]

## 2019-05-11 NOTE — Telephone Encounter (Signed)
@  [facility]   Outreach Note  05/11/2019 Name: Jennifer Skinner MRN: [id number] DOB: 11/01/1931  Referred by: Olin Hauser, DO Reason for referral : Chronic Care Management (Initial CCM outreach call was unsuccessful. ) and Chronic Care Management (Second CCM outreach call was unsuccessful )   A second unsuccessful telephone outreach was attempted today. The patient was referred to the case management team for assistance with chronic care management and care coordination.   Follow Up Plan: A HIPPA compliant phone message was left for the patient providing contact information and requesting a return call.  The care management team will reach out to the patient again over the next 7 days.  If patient returns call to provider office, please advise to call [place] at [place]  [email or web address]   ??[phone]

## 2019-05-12 ENCOUNTER — Telehealth: Payer: Self-pay | Admitting: Family Medicine

## 2019-05-12 DIAGNOSIS — G8929 Other chronic pain: Secondary | ICD-10-CM

## 2019-05-12 DIAGNOSIS — M48062 Spinal stenosis, lumbar region with neurogenic claudication: Secondary | ICD-10-CM

## 2019-05-12 MED ORDER — TRAMADOL HCL 50 MG PO TABS: 50.0000 mg | ORAL_TABLET | Freq: Four times a day (QID) | ORAL | 2 refills | Status: DC | PRN

## 2019-05-12 NOTE — Telephone Encounter (Signed)
Pt. Called requesting refill on tramadol °

## 2019-05-15 DIAGNOSIS — I272 Pulmonary hypertension, unspecified: Secondary | ICD-10-CM | POA: Diagnosis not present

## 2019-05-18 ENCOUNTER — Ambulatory Visit: Payer: PPO | Admitting: Family

## 2019-05-19 ENCOUNTER — Ambulatory Visit: Payer: PPO | Admitting: Family

## 2019-05-19 ENCOUNTER — Telehealth: Payer: Self-pay | Admitting: Family

## 2019-05-19 NOTE — Telephone Encounter (Signed)
Patient did not show for her [facility] appointment on 05/19/2019. Will attempt to reschedule.  

## 2019-05-19 NOTE — Chronic Care Management (AMB) (Signed)
°  Chronic Care Management   Outreach Note  05/19/2019 Name: Jennifer Skinner MRN: [id number] DOB: 03-24-1931  Referred by: Olin Hauser, DO Reason for referral : Chronic Care Management (Initial CCM outreach call was unsuccessful. ); Chronic Care Management (Second CCM outreach call was unsuccessful ); and Chronic Care Management (Third CCM outreach call was unsuccessful )   Third unsuccessful telephone outreach was attempted today. The patient was referred to the case management team for assistance with chronic care management and care coordination. The patient's primary care provider has been notified of our unsuccessful attempts to make or maintain contact with the patient. The care management team is pleased to engage with this patient at any time in the future should he/she be interested in assistance from the care management team.   Follow Up Plan: The care management team is available to follow up with the patient after provider conversation with the patient regarding recommendation for care management engagement and subsequent re-referral to the care management team.   [place]  [email or web address]   ??[phone]

## 2019-05-25 ENCOUNTER — Other Ambulatory Visit: Payer: Self-pay

## 2019-05-25 DIAGNOSIS — I890 Lymphedema, not elsewhere classified: Secondary | ICD-10-CM

## 2019-05-25 DIAGNOSIS — I5032 Chronic diastolic (congestive) heart failure: Secondary | ICD-10-CM

## 2019-05-25 MED ORDER — TORSEMIDE 20 MG PO TABS: 40.0000 mg | ORAL_TABLET | Freq: Two times a day (BID) | ORAL | 5 refills | Status: DC

## 2019-05-25 NOTE — Telephone Encounter (Signed)
Patient called repoteting that yes it is   2 tablet by mouth 2 times a day.

## 2019-05-25 NOTE — Telephone Encounter (Signed)
[facility] send fax request for refill on:  Torsemide 10mg   #240  Take 4 by mouth 2 times a day.

## 2019-05-28 ENCOUNTER — Other Ambulatory Visit: Payer: Self-pay | Admitting: Family Medicine

## 2019-05-28 DIAGNOSIS — E7800 Pure hypercholesterolemia, unspecified: Secondary | ICD-10-CM

## 2019-06-12 ENCOUNTER — Other Ambulatory Visit: Payer: Self-pay | Admitting: Family Medicine

## 2019-06-12 DIAGNOSIS — I10 Essential (primary) hypertension: Secondary | ICD-10-CM

## 2019-06-14 DIAGNOSIS — I272 Pulmonary hypertension, unspecified: Secondary | ICD-10-CM | POA: Diagnosis not present

## 2019-06-24 ENCOUNTER — Other Ambulatory Visit: Payer: PPO

## 2019-07-01 ENCOUNTER — Ambulatory Visit: Payer: PPO | Admitting: Family Medicine

## 2019-07-02 ENCOUNTER — Encounter: Payer: Self-pay | Admitting: Family Medicine

## 2019-07-02 ENCOUNTER — Telehealth: Payer: Self-pay

## 2019-07-02 ENCOUNTER — Other Ambulatory Visit: Payer: Self-pay

## 2019-07-02 ENCOUNTER — Ambulatory Visit (INDEPENDENT_AMBULATORY_CARE_PROVIDER_SITE_OTHER): Payer: PPO | Admitting: Family Medicine

## 2019-07-02 DIAGNOSIS — M160 Bilateral primary osteoarthritis of hip: Secondary | ICD-10-CM | POA: Diagnosis not present

## 2019-07-02 DIAGNOSIS — G8929 Other chronic pain: Secondary | ICD-10-CM | POA: Diagnosis not present

## 2019-07-02 DIAGNOSIS — M25552 Pain in left hip: Secondary | ICD-10-CM | POA: Diagnosis not present

## 2019-07-02 NOTE — Progress Notes (Signed)
Virtual Visit via Telephone The purpose of this virtual visit is to provide medical care while limiting exposure to the novel coronavirus (COVID19) for both patient and office staff.  Consent was obtained for phone visit:  Yes.   Answered questions that patient had about telehealth interaction:  Yes.   I discussed the limitations, risks, security and privacy concerns of performing an evaluation and management service by telephone. I also discussed with the patient that there may be a patient responsible charge related to this service. The patient expressed understanding and agreed to proceed.  Patient Location: Home Provider Location: [facility])  ---------------------------------------------------------------------- Chief Complaint  Patient presents with  . Hip Pain    S: Reviewed CMA documentation. I have called patient and gathered additional HPI as follows:  LEFT HIP PAIN, CHRONIC Reports new flare up, with symptoms 3-4 weeks ago, without inciting injury - describes with Left hip pain localized pain, usually with getting up out of bed and walking and getting to bathroom, can persist in morning for few hour, worse when first wakes up, then it gradually improves. - She is taking Tramadol 50mg  QID with some benefit of reducing pain - Taking Tylenol PRN with benefit, does cause nose bleed occasionally - Taking Gabapentin 100mg  TID already Denies any fall or injury or redness swelling   Denies any high risk travel to areas of current concern for COVID19. Denies any known or suspected exposure to person with or possibly with COVID19.  Denies any fevers, chills, sweats, body ache, cough, shortness of breath, sinus pain or pressure, headache, abdominal pain, diarrhea  Past Medical History:  Diagnosis Date  . 1st degree AV block   . Arthritis   . Chronic diastolic CHF (congestive heart failure) ([place])    a. echo 2014: EF 55-60%, nl wall motion, GR1DD, mild MR,  moder mitral prolapse, PASP 40 mm Hg; b. echo 09/2015: EF 60-65%, nl wall motion, GR1DD, mod MR, mild to mod TR, PASP 56 mm Hg  . COPD (chronic obstructive pulmonary disease) ([place])   . Degenerative disc disease, lumbar   . Gastro-esophageal reflux   . Hyperlipidemia   . Mitral prolapse   . Mitral regurgitation   . Osteoporosis    Social History   Tobacco Use  . Smoking status: Never Smoker  . Smokeless tobacco: Never Used  Substance Use Topics  . Alcohol use: No    Alcohol/week: 0.0 standard drinks  . Drug use: No    Current Outpatient Medications:  .  acetaminophen (TYLENOL) 500 MG tablet, Take 500 mg by mouth every 6 (six) hours as needed., Disp: , Rfl:  .  albuterol (PROVENTIL) (2.5 MG/3ML) 0.083% nebulizer solution, Take 3 mLs (2.5 mg total) by nebulization every 6 (six) hours as needed for wheezing or shortness of breath., Disp: 75 mL, Rfl: 12 .  aspirin EC 81 MG tablet, Take 1 tablet (81 mg total) by mouth daily., Disp: 90 tablet, Rfl: 3 .  Calcium Gluconate 500 MG CAPS, Take 2 capsules by mouth daily. , Disp: , Rfl:  .  gabapentin (NEURONTIN) 100 MG capsule, Take 1 capsule (100 mg total) by mouth 3 (three) times daily., Disp: 270 capsule, Rfl: 1 .  levothyroxine (SYNTHROID, LEVOTHROID) 25 MCG tablet, Take 1 tablet (25 mcg total) by mouth daily before breakfast., Disp: 90 tablet, Rfl: 1 .  metoprolol tartrate (LOPRESSOR) 25 MG tablet, Take 0.5 tablets (12.5 mg total) by mouth 2 (two) times daily., Disp: 90 tablet, Rfl: 1 .  mirabegron ER (MYRBETRIQ) 25 MG TB24 tablet, Take 1 tablet (25 mg total) by mouth daily., Disp: 30 tablet, Rfl: 5 .  Multiple Vitamin (MULTI-VITAMIN DAILY PO), Take 1 tablet by mouth daily. , Disp: , Rfl:  .  [place] (OYSTER CALCIUM) 500 MG TABS, Take 500 mg of elemental calcium by mouth 2 (two) times daily., Disp: , Rfl:  .  polyethylene glycol powder (GLYCOLAX/MIRALAX) powder, Take 17-34 g by mouth daily as needed., Disp: 250 g, Rfl: 2 .  potassium  chloride (K-DUR,KLOR-CON) 10 MEQ tablet, Take 10 mEq by mouth 3 (three) times daily., Disp: , Rfl:  .  rosuvastatin (CRESTOR) 10 MG tablet, Take 1 tablet (10 mg total) by mouth at bedtime., Disp: 90 tablet, Rfl: 3 .  sildenafil (REVATIO) 20 MG tablet, Take 1 tablet (20 mg total) by mouth 3 (three) times daily. For pulmonary hypertension, Disp: 90 tablet, Rfl: 5 .  torsemide (DEMADEX) 20 MG tablet, Take 2 tablets (40 mg total) by mouth 2 (two) times daily., Disp: 120 tablet, Rfl: 5 .  traMADol (ULTRAM) 50 MG tablet, Take 1 tablet (50 mg total) by mouth every 6 (six) hours as needed for moderate pain., Disp: 120 tablet, Rfl: 2  Depression screen [facility] 2/9 07/02/2019 09/23/2018 08/19/2018  Decreased Interest 0 0 0  Down, Depressed, Hopeless 0 0 0  PHQ - 2 Score 0 0 0  Altered sleeping - - -  Tired, decreased energy - - -  Change in appetite - - -  Feeling bad or failure about yourself  - - -  Trouble concentrating - - -  Moving slowly or fidgety/restless - - -  Suicidal thoughts - - -  PHQ-9 Score - - -  Difficult doing work/chores - - -    GAD 7 : Generalized Anxiety Score 09/04/2016  Nervous, Anxious, on Edge 0  Control/stop worrying 0  Worry too much - different things 0  Trouble relaxing 0  Restless 0  Easily annoyed or irritable 0  Afraid - awful might happen 0  Total GAD 7 Score 0  Anxiety Difficulty Not difficult at all    -------------------------------------------------------------------------- O: No physical exam performed due to remote telephone encounter.   No results found for this or any previous visit (from the past 2160 hour(s)).  -------------------------------------------------------------------------- A&P:  Problem List Items Addressed This Visit    Chronic left hip pain - Primary   Primary osteoarthritis of both hips     Acute on chronic L Hip pain, likely trochanteric bursitis given history vs known underlying OA/DJD given other joints. not responding to  conservative therapy  Plan: 1. Already on Tramadol for back pain, advised she can take 1 extra tab in AM since that is worse time for hip pain help her mobility and function, otherwise continue QID dose 2. May increase Tylenol dosing as advised 3. Continue Gabapentin, may increase dose as advised caution sedation 4. Encouraged use of heating pad 1-2x daily for now then PRN. 5. If not improving can return to office consider troch bursa injection vs prednisone burst therapy vs referral to ortho  No orders of the defined types were placed in this encounter.   Follow-up: - Return in 2 weeks L hip pain arthritis if not improve  Patient verbalizes understanding with the above medical recommendations including the limitation of remote medical advice.  Specific follow-up and call-back criteria were given for patient to follow-up or seek medical care more urgently if needed.   - Time spent in direct  consultation with patient on phone: 12 minutes  Nobie Putnam, [facility] 07/02/2019, 10:44 AM

## 2019-07-02 NOTE — Patient Instructions (Signed)
AVS info given by phone. 

## 2019-07-02 NOTE — Telephone Encounter (Signed)
Opened in error

## 2019-07-09 ENCOUNTER — Telehealth: Payer: Self-pay | Admitting: Family Medicine

## 2019-07-09 ENCOUNTER — Telehealth: Payer: Self-pay

## 2019-07-09 NOTE — Telephone Encounter (Signed)
Patient fall in the bathroom yesterday has a back pain wanted to know can she up her tramadol suggested to go to urgent care if she is hurting a lot instead of making change so they can evaluate better with further testing or schedule an appointment later today or tomorrow. Patient denies to schedule any appointment and just wanted to do med's change.

## 2019-07-09 NOTE — Telephone Encounter (Signed)
See previous telephone note from earlier today 07/09/19.  Asa Lente spoke with patient and we believe she could use a "Drive Adjustable Height Rollator" walker.  I can hand write a rx for it and we can fax to [facility] if they need, if they do not fax [place] the forms.  Will be able to do this when I return to office on Monday 07/13/19.  Nobie Putnam, [facility] 07/09/2019, 5:50 PM

## 2019-07-09 NOTE — Telephone Encounter (Signed)
[facility] called back and said that they need you to fax a rx for the walker to the place you gave her.  She could not give me any information.

## 2019-07-09 NOTE — Telephone Encounter (Signed)
Called patient. She is asking if she can take an extra dose of Tramadol. I advised her that she can, she already takes x 2 dose in AM and then 1 every 6 hours so total of 5 pills in 24 hours, I advised MAX dose is 6 in 24 hours, if she needs, only temporary. She is in pain from back but still able to function and doing alright, she denies concern of fracture, declines going to urgent care, she has help at home with husband. She will rest today.   She is asking about a walker, we did eval about 1 week ago virtual on phone, I advised her that she can contact [place] gave her phone # she can check with them how to get it covered by insurance and which TYPE of walker she needs, they can fax [place] order form or if need we can send a written rx to them to receive the order form, she needs to let us know which type of walker  Jennifer Skinner, [facility] 07/09/2019, 12:56 PM

## 2019-07-09 NOTE — Telephone Encounter (Signed)
Advised patient to call [place] supply and give them her choice she want not long and has wheels on it ( I think could be Drive adjustable height rollator) since it is covered by her insurance and they can fax the forms.

## 2019-07-13 NOTE — Telephone Encounter (Signed)
Rx, office notes and insurance info faxed to [phone].

## 2019-07-15 DIAGNOSIS — I272 Pulmonary hypertension, unspecified: Secondary | ICD-10-CM | POA: Diagnosis not present

## 2019-07-16 ENCOUNTER — Other Ambulatory Visit: Payer: Self-pay | Admitting: Family Medicine

## 2019-07-16 DIAGNOSIS — I272 Pulmonary hypertension, unspecified: Secondary | ICD-10-CM

## 2019-08-04 ENCOUNTER — Other Ambulatory Visit: Payer: Self-pay | Admitting: Family Medicine

## 2019-08-04 ENCOUNTER — Telehealth: Payer: Self-pay | Admitting: Family Medicine

## 2019-08-04 DIAGNOSIS — M48062 Spinal stenosis, lumbar region with neurogenic claudication: Secondary | ICD-10-CM

## 2019-08-04 DIAGNOSIS — G8929 Other chronic pain: Secondary | ICD-10-CM

## 2019-08-04 MED ORDER — TRAMADOL HCL 50 MG PO TABS: 50.0000 mg | ORAL_TABLET | Freq: Four times a day (QID) | ORAL | 2 refills | Status: DC | PRN

## 2019-08-04 NOTE — Telephone Encounter (Signed)
Refilled.  Jennifer Skinner, [facility] 08/04/2019, 5:12 PM

## 2019-08-04 NOTE — Telephone Encounter (Signed)
Pt called requesting refill on  tramadol

## 2019-08-05 ENCOUNTER — Other Ambulatory Visit: Payer: PPO

## 2019-08-12 ENCOUNTER — Ambulatory Visit: Payer: PPO | Admitting: Family Medicine

## 2019-08-15 DIAGNOSIS — I272 Pulmonary hypertension, unspecified: Secondary | ICD-10-CM | POA: Diagnosis not present

## 2019-08-26 ENCOUNTER — Other Ambulatory Visit: Payer: PPO

## 2019-08-26 ENCOUNTER — Other Ambulatory Visit: Payer: Self-pay

## 2019-08-26 ENCOUNTER — Other Ambulatory Visit: Payer: Self-pay | Admitting: Nurse Practitioner

## 2019-08-26 DIAGNOSIS — I10 Essential (primary) hypertension: Secondary | ICD-10-CM

## 2019-08-26 DIAGNOSIS — E7800 Pure hypercholesterolemia, unspecified: Secondary | ICD-10-CM | POA: Diagnosis not present

## 2019-08-26 DIAGNOSIS — R7309 Other abnormal glucose: Secondary | ICD-10-CM

## 2019-08-26 DIAGNOSIS — K219 Gastro-esophageal reflux disease without esophagitis: Secondary | ICD-10-CM

## 2019-08-26 DIAGNOSIS — E034 Atrophy of thyroid (acquired): Secondary | ICD-10-CM | POA: Diagnosis not present

## 2019-08-27 ENCOUNTER — Other Ambulatory Visit: Payer: PPO

## 2019-08-27 ENCOUNTER — Other Ambulatory Visit: Payer: Self-pay | Admitting: Nurse Practitioner

## 2019-08-27 ENCOUNTER — Other Ambulatory Visit: Payer: Self-pay | Admitting: Family Medicine

## 2019-08-27 DIAGNOSIS — M48062 Spinal stenosis, lumbar region with neurogenic claudication: Secondary | ICD-10-CM

## 2019-08-27 DIAGNOSIS — M545 Low back pain, unspecified: Secondary | ICD-10-CM

## 2019-08-27 DIAGNOSIS — G8929 Other chronic pain: Secondary | ICD-10-CM

## 2019-08-27 LAB — CBC WITH DIFFERENTIAL/PLATELET
Absolute Monocytes: 492 cells/uL (ref 200–950)
Basophils Absolute: 20 cells/uL (ref 0–200)
Basophils Relative: 0.5 %
Eosinophils Absolute: 72 cells/uL (ref 15–500)
Eosinophils Relative: 1.8 %
HCT: 37.3 % (ref 35.0–45.0)
Hemoglobin: 12.5 g/dL (ref 11.7–15.5)
Lymphs Abs: 680 cells/uL — ABNORMAL LOW (ref 850–3900)
MCH: 33.6 pg — ABNORMAL HIGH (ref 27.0–33.0)
MCHC: 33.5 g/dL (ref 32.0–36.0)
MCV: 100.3 fL — ABNORMAL HIGH (ref 80.0–100.0)
MPV: 10 fL (ref 7.5–12.5)
Monocytes Relative: 12.3 %
Neutro Abs: 2736 cells/uL (ref 1500–7800)
Neutrophils Relative %: 68.4 %
Platelets: 210 10*3/uL (ref 140–400)
RBC: 3.72 10*6/uL — ABNORMAL LOW (ref 3.80–5.10)
RDW: 12.1 % (ref 11.0–15.0)
Total Lymphocyte: 17 %
WBC: 4 10*3/uL (ref 3.8–10.8)

## 2019-08-27 LAB — COMPREHENSIVE METABOLIC PANEL
AG Ratio: 1.3 (calc) (ref 1.0–2.5)
ALT: 27 U/L (ref 6–29)
AST: 33 U/L (ref 10–35)
Albumin: 3.7 g/dL (ref 3.6–5.1)
Alkaline phosphatase (APISO): 67 U/L (ref 37–153)
BUN: 22 mg/dL (ref 7–25)
CO2: 30 mmol/L (ref 20–32)
Calcium: 8.9 mg/dL (ref 8.6–10.4)
Chloride: 99 mmol/L (ref 98–110)
Creat: 0.65 mg/dL (ref 0.60–0.88)
Globulin: 2.9 g/dL (calc) (ref 1.9–3.7)
Glucose, Bld: 88 mg/dL (ref 65–99)
Potassium: 4.2 mmol/L (ref 3.5–5.3)
Sodium: 137 mmol/L (ref 135–146)
Total Bilirubin: 0.4 mg/dL (ref 0.2–1.2)
Total Protein: 6.6 g/dL (ref 6.1–8.1)

## 2019-08-27 LAB — TSH: TSH: 3.62 mIU/L (ref 0.40–4.50)

## 2019-08-27 LAB — LIPID PANEL
Cholesterol: 129 mg/dL (ref <200)
HDL: 51 mg/dL (ref >=50)
LDL Cholesterol (Calc): 64 mg/dL (calc)
Non-HDL Cholesterol (Calc): 78 mg/dL (calc) (ref <130)
Total CHOL/HDL Ratio: 2.5 (calc) (ref <5.0)
Triglycerides: 56 mg/dL (ref <150)

## 2019-08-27 LAB — HEMOGLOBIN A1C
Hgb A1c MFr Bld: 5.6 % of total Hgb (ref <5.7)
Mean Plasma Glucose: 114 (calc)
eAG (mmol/L): 6.3 (calc)

## 2019-08-27 MED ORDER — TRAMADOL HCL 50 MG PO TABS: 50.0000 mg | ORAL_TABLET | Freq: Four times a day (QID) | ORAL | 0 refills | Status: DC | PRN

## 2019-08-27 MED ORDER — TRAMADOL HCL 50 MG PO TABS: ORAL_TABLET | ORAL | 0 refills | Status: DC

## 2019-08-27 NOTE — Telephone Encounter (Signed)
Pt said that she take 5 tramadol a day 2 in the morning 3 at night ,said that she do not have enough until  Wednesday for her appt. Wanted to know f you would send in a prescription for more until  Wednesday

## 2019-08-27 NOTE — Telephone Encounter (Signed)
Patient's prescription is only for 1 tablet every 6 hours.  She is not taking this medication correctly and this is why she is running out early.  I will send 5 days of medication for four tablets daily.  She can discuss dose increase with Dr. Raliegh Ip at the appointment.    - Previous note 07/02/2019 Dr. Raliegh Ip suggested she take extra tab for 100 mg in am, but Rx was not increased.

## 2019-09-02 ENCOUNTER — Ambulatory Visit (INDEPENDENT_AMBULATORY_CARE_PROVIDER_SITE_OTHER): Payer: PPO | Admitting: Family Medicine

## 2019-09-02 ENCOUNTER — Other Ambulatory Visit: Payer: Self-pay

## 2019-09-02 ENCOUNTER — Encounter: Payer: Self-pay | Admitting: Family Medicine

## 2019-09-02 VITALS — BP 105/86 | HR 72 | Resp 16 | Ht <= 58 in | Wt 81.6 lb

## 2019-09-02 DIAGNOSIS — M545 Low back pain: Secondary | ICD-10-CM

## 2019-09-02 DIAGNOSIS — Z23 Encounter for immunization: Secondary | ICD-10-CM | POA: Diagnosis not present

## 2019-09-02 DIAGNOSIS — M25552 Pain in left hip: Secondary | ICD-10-CM | POA: Diagnosis not present

## 2019-09-02 DIAGNOSIS — G8929 Other chronic pain: Secondary | ICD-10-CM

## 2019-09-02 DIAGNOSIS — M48062 Spinal stenosis, lumbar region with neurogenic claudication: Secondary | ICD-10-CM

## 2019-09-02 MED ORDER — LIDOCAINE HCL (PF) 1 % IJ SOLN
4.0000 mL | Freq: Once | INTRAMUSCULAR | Status: AC
  Administered 2019-09-02: 4 mL

## 2019-09-02 MED ORDER — TRAMADOL HCL 50 MG PO TABS: ORAL_TABLET | ORAL | 2 refills | Status: DC

## 2019-09-02 MED ORDER — METHYLPREDNISOLONE ACETATE 40 MG/ML IJ SUSP
40.0000 mg | Freq: Once | INTRAMUSCULAR | Status: AC
  Administered 2019-09-02: 40 mg via INTRA_ARTICULAR

## 2019-09-02 NOTE — Progress Notes (Signed)
Subjective:    Patient ID: Jennifer Skinner, female    DOB: 1931/11/01, 85 y.o.   MRN: [id number]  Jennifer Skinner is a 85 y.o. female presenting on 09/02/2019 for Back Pain (refill tramadol )   HPI   LEFT HIP PAIN, CHRONIC / Osteoarthritis Last visit 06/2019 virtual, advised could increase Tramadol by 1 extra dose, and continued other medication treatment recommendations. Asked to return for injection or refer to Ortho if not improve - Now today she returns for persistent L hip pain. Seems episodic worsening flares worse with excess ambulation and activity, get stiff worse pain if sitting or laying for a while then up and walking - She is taking Tramadol 50mg  x 2 in AM for 100mg  then 3 additional doses throughout day, with some benefit of reducing pain - Taking Tylenol PRN with benefit, does cause nose bleed occasionally - Taking Gabapentin 100mg  TID already - Not seen Orthopedics - She would like injection today Denies any fall or injury or redness swelling  Additional complaints Reports multiple skin spots on face R temple and L near nasal area with rough raised spot, without ulceration - she has Dermatology has not been back to them, they are not healing, thinks one of them is caused by oxygen tubing.  Health Maintenance: Due for Flu Shot, will receive today    Depression screen [employer] 2/9 09/02/2019 07/02/2019 09/23/2018  Decreased Interest 0 0 0  Down, Depressed, Hopeless 0 0 0  PHQ - 2 Score 0 0 0  Altered sleeping 0 - -  Tired, decreased energy 0 - -  Change in appetite 0 - -  Feeling bad or failure about yourself  0 - -  Trouble concentrating 0 - -  Moving slowly or fidgety/restless 0 - -  Suicidal thoughts 0 - -  PHQ-9 Score 0 - -  Difficult doing work/chores - - -    Past Medical History:  Diagnosis Date  . 1st degree AV block   . Arthritis   . Chronic diastolic CHF (congestive heart failure) ([place])    a. echo 2014: EF 55-60%, nl wall motion, GR1DD, mild MR, moder  mitral prolapse, PASP 40 mm Hg; b. echo 09/2015: EF 60-65%, nl wall motion, GR1DD, mod MR, mild to mod TR, PASP 56 mm Hg  . COPD (chronic obstructive pulmonary disease) ([place])   . Degenerative disc disease, lumbar   . Gastro-esophageal reflux   . Hyperlipidemia   . Mitral prolapse   . Mitral regurgitation   . Osteoporosis    Past Surgical History:  Procedure Laterality Date  . ABDOMINAL HYSTERECTOMY    . APPENDECTOMY    . DILATION AND CURETTAGE OF UTERUS    . HYSTEROTOMY    . right breast biopsy    . right eye      surgery  . TONSILLECTOMY     Social History   Socioeconomic History  . Marital status: Married    Spouse name: Not on file  . Number of children: Not on file  . Years of education: Not on file  . Highest education level: Not on file  Occupational History  . Not on file  Social Needs  . Financial resource strain: Not hard at all  . Food insecurity    Worry: Never true    Inability: Never true  . Transportation needs    Medical: No    Non-medical: No  Tobacco Use  . Smoking status: Never Smoker  . Smokeless tobacco: Never Used  Substance and Sexual Activity  . Alcohol use: No    Alcohol/week: 0.0 standard drinks  . Drug use: No  . Sexual activity: Never  Lifestyle  . Physical activity    Days per week: 0 days    Minutes per session: 0 min  . Stress: Not at all  Relationships  . Social Herbalist on phone: Twice a week    Gets together: Never    Attends religious service: More than 4 times per year    Active member of club or organization: No    Attends meetings of clubs or organizations: Never    Relationship status: Married  . Intimate partner violence    Fear of current or ex partner: No    Emotionally abused: No    Physically abused: No    Forced sexual activity: No  Other Topics Concern  . Not on file  Social History Narrative  . Not on file   Family History  Problem Relation Age of Onset  . CVA Mother 39  . Heart attack  Father 32  . Throat cancer Sister   . CAD Brother    Current Outpatient Medications on File Prior to Visit  Medication Sig  . acetaminophen (TYLENOL) 500 MG tablet Take 500 mg by mouth every 6 (six) hours as needed.  Marland Kitchen albuterol (PROVENTIL) (2.5 MG/3ML) 0.083% nebulizer solution Take 3 mLs (2.5 mg total) by nebulization every 6 (six) hours as needed for wheezing or shortness of breath.  Marland Kitchen aspirin EC 81 MG tablet Take 1 tablet (81 mg total) by mouth daily.  . Calcium Gluconate 500 MG CAPS Take 2 capsules by mouth daily.   Marland Kitchen gabapentin (NEURONTIN) 100 MG capsule Take 1 capsule (100 mg total) by mouth 3 (three) times daily.  Marland Kitchen levothyroxine (SYNTHROID, LEVOTHROID) 25 MCG tablet Take 1 tablet (25 mcg total) by mouth daily before breakfast.  . metoprolol tartrate (LOPRESSOR) 25 MG tablet Take 0.5 tablets (12.5 mg total) by mouth 2 (two) times daily.  . mirabegron ER (MYRBETRIQ) 25 MG TB24 tablet Take 1 tablet (25 mg total) by mouth daily.  . Multiple Vitamin (MULTI-VITAMIN DAILY PO) Take 1 tablet by mouth daily.   [name] (OYSTER CALCIUM) 500 MG TABS Take 500 mg of elemental calcium by mouth 2 (two) times daily.  . polyethylene glycol powder (GLYCOLAX/MIRALAX) powder Take 17-34 g by mouth daily as needed.  . potassium chloride (K-DUR,KLOR-CON) 10 MEQ tablet Take 10 mEq by mouth 3 (three) times daily.  . rosuvastatin (CRESTOR) 10 MG tablet Take 1 tablet (10 mg total) by mouth at bedtime.  . sildenafil (REVATIO) 20 MG tablet Take 1 tablet (20 mg total) by mouth 3 (three) times daily. For pulmonary hypertension  . torsemide (DEMADEX) 20 MG tablet Take 2 tablets (40 mg total) by mouth 2 (two) times daily.   No current facility-administered medications on file prior to visit.     Review of Systems Per HPI unless specifically indicated above       Objective:    BP 105/86   Pulse 72   Resp 16   Ht 4\' 8"  (1.422 m)   Wt 81 lb 9.6 oz (37 kg)   SpO2 98%   BMI 18.29 kg/m   Wt Readings  from Last 3 Encounters:  09/02/19 81 lb 9.6 oz (37 kg)  10/23/18 88 lb (39.9 kg)  09/23/18 87 lb 2 oz (39.5 kg)    Physical Exam Vitals signs and nursing note reviewed.  Constitutional:      General: She is not in acute distress.    Appearance: She is well-developed. She is not diaphoretic.     Comments: Thin appearing 85 year old chronically ill female, comfortable and pleasant, cooperative  HENT:     Head: Normocephalic and atraumatic.  Eyes:     General:        Right eye: No discharge.        Left eye: No discharge.     Conjunctiva/sclera: Conjunctivae normal.     Pupils: Pupils are equal, round, and reactive to light.  Neck:     Musculoskeletal: Normal range of motion and neck supple.  Cardiovascular:     Rate and Rhythm: Normal rate and regular rhythm.  Pulmonary:     Effort: Pulmonary effort is normal. No respiratory distress.     Breath sounds: No wheezing or rales.  Abdominal:     General: Bowel sounds are normal. There is no distension.     Palpations: Abdomen is soft. There is no mass.     Tenderness: There is no abdominal tenderness.  Musculoskeletal: Normal range of motion.        General: No tenderness.     Comments: Stable since last visit, still with muscle wasting atrophy reduced bulk in upper body extremities and face temples bilaterally  Upper / Lower Extremities: - Normal strength bilateral upper extremities 5/5, lower extremities 5/5  Left hip tender over trochanter and also piriformis gluteal region, has some restricted ROM due to pain and stiffness, able to stand and transfer unassisted  Lymphadenopathy:     Cervical: No cervical adenopathy.  Skin:    General: Skin is warm and dry.     Findings: No erythema or rash.     Comments: Two areas on face - 1 in R temple region with dry rough patch slightly red scaly appearance. Other left of nasolabial area with keratotic lesion again slightly scaly appearing, non tender.  Neurological:     Mental Status:  She is alert and oriented to person, place, and time.     Comments: Distal sensation intact to light touch all extremities  Psychiatric:        Behavior: Behavior normal.     Comments: Well groomed, good eye contact, normal speech and thoughts      ________________________________________________________ PROCEDURE NOTE Date: 09/02/19 Left Hip Corticosteroid injection Discussed benefits and risks (including pain, bleeding, infection, steroid flare). Verbal consent given by patient. Medication:  1 cc Depo-medrol 40mg  and 4 cc Lidocaine 1% without epi Time Out taken  Trochanteric bursa Patient in lateral decubitus position with affected LEFT hip superior. Landmarks identified over LEFT greater trochanter. Palpated to identify point of maximum tenderness over bony process for her was more posterior aspect. Area cleansed with alcohol wipes. Using 21 gauage and 1, 1/2 inch needle, Left trochanteric bursa space was injected (with above listed medication) with needle to bone contact then slightly withdrawn to inject. Cold spray used for superficial anesthetic. Sterile bandage placed. Patient tolerated procedure well without bleeding or paresthesias. No complications.   Results for orders placed or performed in visit on 08/26/19  CBC with Differential/Platelet  Result Value Ref Range   WBC 4.0 3.8 - 10.8 Thousand/uL   RBC 3.72 (L) 3.80 - 5.10 Million/uL   Hemoglobin 12.5 11.7 - 15.5 g/dL   HCT 37.3 35.0 - 45.0 %   MCV 100.3 (H) 80.0 - 100.0 fL   MCH 33.6 (H) 27.0 - 33.0 pg  MCHC 33.5 32.0 - 36.0 g/dL   RDW 12.1 11.0 - 15.0 %   Platelets 210 140 - 400 Thousand/uL   MPV 10.0 7.5 - 12.5 fL   Neutro Abs 2,736 1,500 - 7,800 cells/uL   Lymphs Abs 680 (L) 850 - 3,900 cells/uL   Absolute Monocytes 492 200 - 950 cells/uL   Eosinophils Absolute 72 15 - 500 cells/uL   Basophils Absolute 20 0 - 200 cells/uL   Neutrophils Relative % 68.4 %   Total Lymphocyte 17.0 %   Monocytes Relative 12.3 %    Eosinophils Relative 1.8 %   Basophils Relative 0.5 %  Lipid panel  Result Value Ref Range   Cholesterol 129 <200 mg/dL   HDL 51 > OR = 50 mg/dL   Triglycerides 56 <150 mg/dL   LDL Cholesterol (Calc) 64 mg/dL (calc)   Total CHOL/HDL Ratio 2.5 <5.0 (calc)   Non-HDL Cholesterol (Calc) 78 <130 mg/dL (calc)  Comprehensive metabolic panel  Result Value Ref Range   Glucose, Bld 88 65 - 99 mg/dL   BUN 22 7 - 25 mg/dL   Creat 0.65 0.60 - 0.88 mg/dL   BUN/Creatinine Ratio NOT APPLICABLE 6 - 22 (calc)   Sodium 137 135 - 146 mmol/L   Potassium 4.2 3.5 - 5.3 mmol/L   Chloride 99 98 - 110 mmol/L   CO2 30 20 - 32 mmol/L   Calcium 8.9 8.6 - 10.4 mg/dL   Total Protein 6.6 6.1 - 8.1 g/dL   Albumin 3.7 3.6 - 5.1 g/dL   Globulin 2.9 1.9 - 3.7 g/dL (calc)   AG Ratio 1.3 1.0 - 2.5 (calc)   Total Bilirubin 0.4 0.2 - 1.2 mg/dL   Alkaline phosphatase (APISO) 67 37 - 153 U/L   AST 33 10 - 35 U/L   ALT 27 6 - 29 U/L  TSH  Result Value Ref Range   TSH 3.62 0.40 - 4.50 mIU/L  HgB A1c  Result Value Ref Range   Hgb A1c MFr Bld 5.6 <5.7 % of total Hgb   Mean Plasma Glucose 114 (calc)   eAG (mmol/L) 6.3 (calc)      Assessment & Plan:   Problem List Items Addressed This Visit    Chronic back pain (Chronic)   Relevant Medications   traMADol (ULTRAM) 50 MG tablet   lidocaine (PF) (XYLOCAINE) 1 % injection 4 mL   methylPREDNISolone acetate (DEPO-MEDROL) injection 40 mg   Other Relevant Orders   Ambulatory referral to Orthopedic Surgery   Chronic left hip pain - Primary   Relevant Medications   traMADol (ULTRAM) 50 MG tablet   lidocaine (PF) (XYLOCAINE) 1 % injection 4 mL   methylPREDNISolone acetate (DEPO-MEDROL) injection 40 mg   Other Relevant Orders   Ambulatory referral to Orthopedic Surgery   Lumbar stenosis with neurogenic claudication   Relevant Medications   traMADol (ULTRAM) 50 MG tablet   lidocaine (PF) (XYLOCAINE) 1 % injection 4 mL   methylPREDNISolone acetate (DEPO-MEDROL)  injection 40 mg    Other Visit Diagnoses    Needs flu shot       Relevant Orders   Flu Vaccine QUAD High Dose(Fluad) (Completed)     Subacute on chronic gradual worsening problem with L Hip pain, likely underlying OA/DJD and some combination of Lumbar spinal stenosis as well - now may have flare of trochanteric bursitis given history and exam with point tenderness without known injury or trauma. H/o OA in other joints. No prior hip problems or imaging. -  No radiation of pain or radicular symptoms but has some lower extremity tingling episodic   Plan: 1. LEFT Hip trochanteric injection today - see procedure note 2. Re order Tramadol for enough pills for dosing 5 x daily, usually take 2 in AM and 3 throughout rest of day 3. Use Gabapentin, Tylenol, Heating pad  Referral to [facility] given duration and severity of her chronic hip pain - looking for further diagnostic and management approach, may need intra-articular hip joint injection in future or guided PT regimen or possible procedure.   Orders Placed This Encounter  Procedures  . Flu Vaccine QUAD High Dose(Fluad)  . Ambulatory referral to Orthopedic Surgery    Referral Priority:   Routine    Referral Type:   Surgical    Referral Reason:   Specialty Services Required    Referred to Provider:   Lovell Sheehan, MD    Requested Specialty:   Orthopedic Surgery    Number of Visits Requested:   1    #Dermatology / AKs Spots on face possibly AK lesions vs hyperkeratotic spot on L lower face - she should return to Dermatology as established already for possible cryotherapy freezing to treat.   Meds ordered this encounter  Medications  . traMADol (ULTRAM) 50 MG tablet    Sig: Take two tablets (100 mg total) in am.  Take 1 tablet (50 mg total) every 6 hours as needed for moderate pain up to 3 additional doses per day.    Dispense:  150 tablet    Refill:  2  . lidocaine (PF) (XYLOCAINE) 1 % injection 4 mL  .  methylPREDNISolone acetate (DEPO-MEDROL) injection 40 mg    Follow up plan: Return in about 3 months (around 12/02/2019), or if symptoms worsen or fail to improve, for left hip pain.  Nobie Putnam, [facility] 09/02/2019, 11:11 AM

## 2019-09-02 NOTE — Patient Instructions (Addendum)
Thank you for coming to the office today.  You received a Left Hip Joint steroid injection today. - Lidocaine numbing medicine may ease the pain initially for a few hours until it wears off - As discussed, you may experience a "steroid flare" this evening or within 24-48 hours, anytime medicine is injected into an inflamed joint it can cause the pain to get worse temporarily - Everyone responds differently to these injections, it depends on the patient and the severity of the joint problem, it may provide anywhere from days to weeks, to months of relief. Ideal response is >6 months relief - Try to take it easy for next 1-2 days, avoid over activity and strain on joint (limit walking for hip) - Recommend the following:   - For swelling - rest, compression sleeve / ACE wrap, elevation, and ice packs as needed for first few days   - For pain in future may use heating pad or moist heat as needed  Medication Refilled Tramadol  Referral to [facility]   Please schedule a Follow-up Appointment to: Return in about 3 months (around 12/02/2019), or if symptoms worsen or fail to improve, for left hip pain.  If you have any other questions or concerns, please feel free to call the office or send a message through [place]. You may also schedule an earlier appointment if necessary.  Additionally, you may be receiving a survey about your experience at our office within a few days to 1 week by e-mail or mail. We value your feedback.  Nobie Putnam, DO [place]

## 2019-09-17 DIAGNOSIS — I272 Pulmonary hypertension, unspecified: Secondary | ICD-10-CM | POA: Diagnosis not present

## 2019-09-22 DIAGNOSIS — M7918 Myalgia, other site: Secondary | ICD-10-CM | POA: Diagnosis not present

## 2019-09-22 DIAGNOSIS — M47816 Spondylosis without myelopathy or radiculopathy, lumbar region: Secondary | ICD-10-CM | POA: Diagnosis not present

## 2019-09-22 DIAGNOSIS — M1612 Unilateral primary osteoarthritis, left hip: Secondary | ICD-10-CM | POA: Diagnosis not present

## 2019-09-22 DIAGNOSIS — M25552 Pain in left hip: Secondary | ICD-10-CM | POA: Diagnosis not present

## 2019-10-06 ENCOUNTER — Other Ambulatory Visit: Payer: Self-pay

## 2019-10-06 DIAGNOSIS — I890 Lymphedema, not elsewhere classified: Secondary | ICD-10-CM

## 2019-10-06 DIAGNOSIS — I5032 Chronic diastolic (congestive) heart failure: Secondary | ICD-10-CM

## 2019-10-06 MED ORDER — TORSEMIDE 20 MG PO TABS: 40.0000 mg | ORAL_TABLET | Freq: Two times a day (BID) | ORAL | 5 refills | Status: DC

## 2019-10-18 DIAGNOSIS — I272 Pulmonary hypertension, unspecified: Secondary | ICD-10-CM | POA: Diagnosis not present

## 2019-11-17 DIAGNOSIS — I272 Pulmonary hypertension, unspecified: Secondary | ICD-10-CM | POA: Diagnosis not present

## 2019-12-07 ENCOUNTER — Telehealth: Payer: Self-pay | Admitting: Family Medicine

## 2019-12-07 DIAGNOSIS — I10 Essential (primary) hypertension: Secondary | ICD-10-CM

## 2019-12-07 MED ORDER — POTASSIUM CHLORIDE CRYS ER 10 MEQ PO TBCR: 10.0000 meq | EXTENDED_RELEASE_TABLET | Freq: Three times a day (TID) | ORAL | 1 refills | Status: DC

## 2019-12-07 NOTE — Telephone Encounter (Signed)
Pt called requesting refill on potassium

## 2019-12-08 ENCOUNTER — Encounter: Payer: Self-pay | Admitting: Family Medicine

## 2019-12-08 ENCOUNTER — Other Ambulatory Visit: Payer: Self-pay

## 2019-12-08 ENCOUNTER — Ambulatory Visit (INDEPENDENT_AMBULATORY_CARE_PROVIDER_SITE_OTHER): Payer: PPO | Admitting: Family Medicine

## 2019-12-08 DIAGNOSIS — I10 Essential (primary) hypertension: Secondary | ICD-10-CM | POA: Diagnosis not present

## 2019-12-08 DIAGNOSIS — R7309 Other abnormal glucose: Secondary | ICD-10-CM

## 2019-12-08 DIAGNOSIS — E7800 Pure hypercholesterolemia, unspecified: Secondary | ICD-10-CM | POA: Diagnosis not present

## 2019-12-08 NOTE — Patient Instructions (Addendum)
   Please schedule a Follow-up Appointment to: Return in about 6 months (around 06/07/2020) for 6 month follow-up.  If you have any other questions or concerns, please feel free to call the office or send a message through [place]. You may also schedule an earlier appointment if necessary.  Additionally, you may be receiving a survey about your experience at our office within a few days to 1 week by e-mail or mail. We value your feedback.  Nobie Putnam, DO [place]

## 2019-12-08 NOTE — Progress Notes (Signed)
Virtual Visit via Telephone The purpose of this virtual visit is to provide medical care while limiting exposure to the novel coronavirus (COVID19) for both patient and office staff.  Consent was obtained for phone visit:  Yes.   Answered questions that patient had about telehealth interaction:  Yes.   I discussed the limitations, risks, security and privacy concerns of performing an evaluation and management service by telephone. I also discussed with the patient that there may be a patient responsible charge related to this service. The patient expressed understanding and agreed to proceed.  Patient Location: Home Provider Location: [facility])  ---------------------------------------------------------------------- Chief Complaint  Patient presents with  . Hypertension    S: Reviewed CMA documentation. I have called patient and gathered additional HPI as follows:  Pre-Diabetes Last A1c trend A1c 6.3 in 08/2019 Not checking sugar Meds: Not on med No significant change in diet, mostly balanced. Denies hypoglycemia, polyuria, visual changes, numbness or tingling.  HYPERLIPIDEMIA: - Reports no concerns. Last lipid panel 08/2019, controlled  - Currently taking Rosuvastatin, tolerating well without side effects or myalgias  Denies any high risk travel to areas of current concern for COVID19. Denies any known or suspected exposure to person with or possibly with COVID19.  Denies any fevers, chills, sweats, body ache, cough, shortness of breath, sinus pain or pressure, headache, abdominal pain, diarrhea  Past Medical History:  Diagnosis Date  . 1st degree AV block   . Arthritis   . Chronic diastolic CHF (congestive heart failure) ([place])    a. echo 2014: EF 55-60%, nl wall motion, GR1DD, mild MR, moder mitral prolapse, PASP 40 mm Hg; b. echo 09/2015: EF 60-65%, nl wall motion, GR1DD, mod MR, mild to mod TR, PASP 56 mm Hg  . COPD (chronic obstructive pulmonary  disease) ([place])   . Degenerative disc disease, lumbar   . Gastro-esophageal reflux   . Hyperlipidemia   . Mitral prolapse   . Mitral regurgitation   . Osteoporosis    Social History   Tobacco Use  . Smoking status: Never Smoker  . Smokeless tobacco: Never Used  Substance Use Topics  . Alcohol use: No    Alcohol/week: 0.0 standard drinks  . Drug use: No    Current Outpatient Medications:  .  acetaminophen (TYLENOL) 500 MG tablet, Take 500 mg by mouth every 6 (six) hours as needed., Disp: , Rfl:  .  albuterol (PROVENTIL) (2.5 MG/3ML) 0.083% nebulizer solution, Take 3 mLs (2.5 mg total) by nebulization every 6 (six) hours as needed for wheezing or shortness of breath., Disp: 75 mL, Rfl: 12 .  aspirin EC 81 MG tablet, Take 1 tablet (81 mg total) by mouth daily., Disp: 90 tablet, Rfl: 3 .  Calcium Gluconate 500 MG CAPS, Take 2 capsules by mouth daily. , Disp: , Rfl:  .  gabapentin (NEURONTIN) 100 MG capsule, Take 1 capsule (100 mg total) by mouth 3 (three) times daily., Disp: 270 capsule, Rfl: 1 .  levothyroxine (SYNTHROID, LEVOTHROID) 25 MCG tablet, Take 1 tablet (25 mcg total) by mouth daily before breakfast., Disp: 90 tablet, Rfl: 1 .  metoprolol tartrate (LOPRESSOR) 25 MG tablet, Take 0.5 tablets (12.5 mg total) by mouth 2 (two) times daily., Disp: 90 tablet, Rfl: 1 .  mirabegron ER (MYRBETRIQ) 25 MG TB24 tablet, Take 1 tablet (25 mg total) by mouth daily., Disp: 30 tablet, Rfl: 5 .  Multiple Vitamin (MULTI-VITAMIN DAILY PO), Take 1 tablet by mouth daily. , Disp: , Rfl:  .  Oyster Shell (OYSTER CALCIUM) 500 MG TABS, Take 500 mg of elemental calcium by mouth 2 (two) times daily., Disp: , Rfl:  .  polyethylene glycol powder (GLYCOLAX/MIRALAX) powder, Take 17-34 g by mouth daily as needed., Disp: 250 g, Rfl: 2 .  potassium chloride (KLOR-CON) 10 MEQ tablet, Take 1 tablet (10 mEq total) by mouth 3 (three) times daily., Disp: 270 tablet, Rfl: 1 .  rosuvastatin (CRESTOR) 10 MG tablet, Take 1  tablet (10 mg total) by mouth at bedtime., Disp: 90 tablet, Rfl: 3 .  sildenafil (REVATIO) 20 MG tablet, Take 1 tablet (20 mg total) by mouth 3 (three) times daily. For pulmonary hypertension, Disp: 90 tablet, Rfl: 3 .  torsemide (DEMADEX) 20 MG tablet, Take 2 tablets (40 mg total) by mouth 2 (two) times daily., Disp: 120 tablet, Rfl: 5 .  traMADol (ULTRAM) 50 MG tablet, Take two tablets (100 mg total) in am.  Take 1 tablet (50 mg total) every 6 hours as needed for moderate pain up to 3 additional doses per day., Disp: 150 tablet, Rfl: 2  Depression screen [facility] 2/9 12/08/2019 09/02/2019 07/02/2019  Decreased Interest 0 0 0  Down, Depressed, Hopeless 0 0 0  PHQ - 2 Score 0 0 0  Altered sleeping - 0 -  Tired, decreased energy - 0 -  Change in appetite - 0 -  Feeling bad or failure about yourself  - 0 -  Trouble concentrating - 0 -  Moving slowly or fidgety/restless - 0 -  Suicidal thoughts - 0 -  PHQ-9 Score - 0 -  Difficult doing work/chores - - -    GAD 7 : Generalized Anxiety Score 09/04/2016  Nervous, Anxious, on Edge 0  Control/stop worrying 0  Worry too much - different things 0  Trouble relaxing 0  Restless 0  Easily annoyed or irritable 0  Afraid - awful might happen 0  Total GAD 7 Score 0  Anxiety Difficulty Not difficult at all    -------------------------------------------------------------------------- O: No physical exam performed due to remote telephone encounter.  Lab results reviewed.  No results found for this or any previous visit (from the past 2160 hour(s)).  -------------------------------------------------------------------------- A&P:  Problem List Items Addressed This Visit    Hyperlipidemia   Essential hypertension - Primary   Elevated hemoglobin A1c     #HTN Controlled on current regimen. No changes today  #Elevated A1c Last result 5.6, well controlled Not due for repeat, defer for now Encourage improve lifestyle diet as  discussed  #HLD Controlled on current statin therapy  No orders of the defined types were placed in this encounter.   Follow-up: - Return in 6 months for 6 month follow-up  Patient verbalizes understanding with the above medical recommendations including the limitation of remote medical advice.  Specific follow-up and call-back criteria were given for patient to follow-up or seek medical care more urgently if needed.   - Time spent in direct consultation with patient on phone: 6 minutes  Nobie Putnam, [facility] 12/08/2019, 10:59 AM

## 2019-12-14 ENCOUNTER — Other Ambulatory Visit: Payer: Self-pay | Admitting: Family Medicine

## 2019-12-14 DIAGNOSIS — I272 Pulmonary hypertension, unspecified: Secondary | ICD-10-CM

## 2019-12-16 ENCOUNTER — Other Ambulatory Visit: Payer: Self-pay | Admitting: Family Medicine

## 2019-12-16 DIAGNOSIS — I272 Pulmonary hypertension, unspecified: Secondary | ICD-10-CM

## 2019-12-16 MED ORDER — SILDENAFIL CITRATE 20 MG PO TABS: 20.0000 mg | ORAL_TABLET | Freq: Three times a day (TID) | ORAL | 3 refills | Status: DC

## 2019-12-16 NOTE — Telephone Encounter (Signed)
Pt. called requesting refill on sildenafil

## 2019-12-18 DIAGNOSIS — I272 Pulmonary hypertension, unspecified: Secondary | ICD-10-CM | POA: Diagnosis not present

## 2020-01-06 ENCOUNTER — Other Ambulatory Visit: Payer: Self-pay | Admitting: Family Medicine

## 2020-01-06 ENCOUNTER — Telehealth: Payer: Self-pay | Admitting: Family Medicine

## 2020-01-06 DIAGNOSIS — M5136 Other intervertebral disc degeneration, lumbar region: Secondary | ICD-10-CM

## 2020-01-06 DIAGNOSIS — M5416 Radiculopathy, lumbar region: Secondary | ICD-10-CM

## 2020-01-06 DIAGNOSIS — M47817 Spondylosis without myelopathy or radiculopathy, lumbosacral region: Secondary | ICD-10-CM

## 2020-01-06 DIAGNOSIS — M51369 Other intervertebral disc degeneration, lumbar region without mention of lumbar back pain or lower extremity pain: Secondary | ICD-10-CM

## 2020-01-06 NOTE — Telephone Encounter (Signed)
Send for approval. 

## 2020-01-06 NOTE — Telephone Encounter (Signed)
Pt called requesting refill on gabapentin

## 2020-01-18 ENCOUNTER — Other Ambulatory Visit: Payer: Self-pay

## 2020-01-18 DIAGNOSIS — G8929 Other chronic pain: Secondary | ICD-10-CM

## 2020-01-18 DIAGNOSIS — M48062 Spinal stenosis, lumbar region with neurogenic claudication: Secondary | ICD-10-CM

## 2020-01-18 DIAGNOSIS — I272 Pulmonary hypertension, unspecified: Secondary | ICD-10-CM | POA: Diagnosis not present

## 2020-01-18 MED ORDER — TRAMADOL HCL 50 MG PO TABS: ORAL_TABLET | ORAL | 2 refills | Status: DC

## 2020-01-19 ENCOUNTER — Other Ambulatory Visit: Payer: Self-pay

## 2020-01-19 ENCOUNTER — Ambulatory Visit (INDEPENDENT_AMBULATORY_CARE_PROVIDER_SITE_OTHER): Payer: PPO | Admitting: Family Medicine

## 2020-01-19 ENCOUNTER — Encounter: Payer: Self-pay | Admitting: Family Medicine

## 2020-01-19 VITALS — Temp 98.4°F

## 2020-01-19 DIAGNOSIS — R61 Generalized hyperhidrosis: Secondary | ICD-10-CM

## 2020-01-19 DIAGNOSIS — R040 Epistaxis: Secondary | ICD-10-CM | POA: Diagnosis not present

## 2020-01-19 NOTE — Progress Notes (Signed)
Virtual Visit via Telephone The purpose of this virtual visit is to provide medical care while limiting exposure to the novel coronavirus (COVID19) for both patient and office staff.  Consent was obtained for phone visit:  Yes.   Answered questions that patient had about telehealth interaction:  Yes.   I discussed the limitations, risks, security and privacy concerns of performing an evaluation and management service by telephone. I also discussed with the patient that there may be a patient responsible charge related to this service. The patient expressed understanding and agreed to proceed.  Patient Location: Home Provider Location: [facility])  ---------------------------------------------------------------------- Chief Complaint  Patient presents with  . Epistaxis    Rt side nostril bleed. She state she only notice the nosebleeds when she blows her nose  x 4 days     S: Reviewed CMA documentation. I have called patient and gathered additional HPI as follows:  RIGHT EPISTAXIS Reports that symptoms started x 4 days ago onset with episodic nosebleeds. Seems to have onset after blowing nose, recurrent episodes with small to moderate bleeding usually bleeds small amount and then if she blows nose again or later will scratch and it may bleed again. Not having oozing or dripping blood out of nose. Did not have any nasal or facial injury or trauma or contusion. She takes ASA 81mg  daily. Not on anticoagulant. She has not had significant sinus or nose bleeds before. Denies any loss of smell, headache, sinus pain or pressure congestion cough  Additional complaint - Night sweats: reports feels overheated at times and overnight can wake up in a sweat. She says usually doesn't happen but had episode recently, thinks laying next to husband if cold out his body temperature was warm. She has no daytime symptoms and no other associated concerns with this Denies unintentional  weight loss, malaise fatigue or fever or other systemic symptoms  Denies any high risk travel to areas of current concern for COVID19. Denies any known or suspected exposure to person with or possibly with COVID19.  Denies any fevers, chills, body ache, cough, shortness of breath, sinus pain or pressure, headache, abdominal pain, diarrhea  Past Medical History:  Diagnosis Date  . 1st degree AV block   . Arthritis   . Chronic diastolic CHF (congestive heart failure) ([place])    a. echo 2014: EF 55-60%, nl wall motion, GR1DD, mild MR, moder mitral prolapse, PASP 40 mm Hg; b. echo 09/2015: EF 60-65%, nl wall motion, GR1DD, mod MR, mild to mod TR, PASP 56 mm Hg  . COPD (chronic obstructive pulmonary disease) ([place])   . Degenerative disc disease, lumbar   . Gastro-esophageal reflux   . Hyperlipidemia   . Mitral prolapse   . Mitral regurgitation   . Osteoporosis    Social History   Tobacco Use  . Smoking status: Never Smoker  . Smokeless tobacco: Never Used  Substance Use Topics  . Alcohol use: No    Alcohol/week: 0.0 standard drinks  . Drug use: No    Current Outpatient Medications:  .  albuterol (PROVENTIL) (2.5 MG/3ML) 0.083% nebulizer solution, Take 3 mLs (2.5 mg total) by nebulization every 6 (six) hours as needed for wheezing or shortness of breath., Disp: 75 mL, Rfl: 12 .  aspirin EC 81 MG tablet, Take 1 tablet (81 mg total) by mouth daily., Disp: 90 tablet, Rfl: 3 .  Calcium Gluconate 500 MG CAPS, Take 2 capsules by mouth daily. , Disp: , Rfl:  .  gabapentin (  NEURONTIN) 100 MG capsule, Take 1 capsule (100 mg total) by mouth 3 (three) times daily., Disp: 270 capsule, Rfl: 2 .  levothyroxine (SYNTHROID, LEVOTHROID) 25 MCG tablet, Take 1 tablet (25 mcg total) by mouth daily before breakfast., Disp: 90 tablet, Rfl: 1 .  metoprolol tartrate (LOPRESSOR) 25 MG tablet, Take 0.5 tablets (12.5 mg total) by mouth 2 (two) times daily., Disp: 90 tablet, Rfl: 1 .  mirabegron ER (MYRBETRIQ) 25  MG TB24 tablet, Take 1 tablet (25 mg total) by mouth daily., Disp: 30 tablet, Rfl: 5 .  Multiple Vitamin (MULTI-VITAMIN DAILY PO), Take 1 tablet by mouth daily. , Disp: , Rfl:  .  Oyster Shell (OYSTER CALCIUM) 500 MG TABS, Take 500 mg of elemental calcium by mouth 2 (two) times daily., Disp: , Rfl:  .  polyethylene glycol powder (GLYCOLAX/MIRALAX) powder, Take 17-34 g by mouth daily as needed., Disp: 250 g, Rfl: 2 .  potassium chloride (KLOR-CON) 10 MEQ tablet, Take 1 tablet (10 mEq total) by mouth 3 (three) times daily., Disp: 270 tablet, Rfl: 1 .  rosuvastatin (CRESTOR) 10 MG tablet, Take 1 tablet (10 mg total) by mouth at bedtime., Disp: 90 tablet, Rfl: 3 .  sildenafil (REVATIO) 20 MG tablet, Take 1 tablet (20 mg total) by mouth 3 (three) times daily. For pulmonary hypertension, Disp: 90 tablet, Rfl: 3 .  torsemide (DEMADEX) 20 MG tablet, Take 2 tablets (40 mg total) by mouth 2 (two) times daily., Disp: 120 tablet, Rfl: 5 .  traMADol (ULTRAM) 50 MG tablet, Take two tablets (100 mg total) in am.  Take 1 tablet (50 mg total) every 6 hours as needed for moderate pain up to 3 additional doses per day., Disp: 150 tablet, Rfl: 2 .  acetaminophen (TYLENOL) 500 MG tablet, Take 500 mg by mouth every 6 (six) hours as needed., Disp: , Rfl:   Depression screen [facility] 2/9 12/08/2019 09/02/2019 07/02/2019  Decreased Interest 0 0 0  Down, Depressed, Hopeless 0 0 0  PHQ - 2 Score 0 0 0  Altered sleeping - 0 -  Tired, decreased energy - 0 -  Change in appetite - 0 -  Feeling bad or failure about yourself  - 0 -  Trouble concentrating - 0 -  Moving slowly or fidgety/restless - 0 -  Suicidal thoughts - 0 -  PHQ-9 Score - 0 -  Difficult doing work/chores - - -    GAD 7 : Generalized Anxiety Score 09/04/2016  Nervous, Anxious, on Edge 0  Control/stop worrying 0  Worry too much - different things 0  Trouble relaxing 0  Restless 0  Easily annoyed or irritable 0  Afraid - awful might happen 0  Total GAD 7  Score 0  Anxiety Difficulty Not difficult at all    -------------------------------------------------------------------------- O: No physical exam performed due to remote telephone encounter.  Lab results reviewed.  No results found for this or any previous visit (from the past 2160 hour(s)).  -------------------------------------------------------------------------- A&P:  Problem List Items Addressed This Visit    None    Visit Diagnoses    Epistaxis    -  Primary  Acute R sided recurrent anterior epistaxis over past 4 days, likely trigger is mechanical with blowing nose and triggered minor bleed and likely inc risk on ASA 81mg  daily but not anticoagulation - No sinus acute symptoms - No injury  Plan: 1. Reassurance, seems self limited now already improved - Continue ASA - Reviewed precautions / home treatment - use OTC Neosporin  for nares for 7-10 days provide moisture and protect, reduce risk infection - May use nasal saline spray - May use Afrin PRN ONLY if acute bleeding wont stop. F/u if not improving.     Night sweat        History suggests more temperature related instability cause, with recent  x2 night sweats No other associated features of concern at this time Less likely hormonal, age 37 usually not common problem for her Already on gabapentin can lessen night sweat Monitor for now, reassurance   No orders of the defined types were placed in this encounter.   Follow-up: - Return as needed if not improved  Patient verbalizes understanding with the above medical recommendations including the limitation of remote medical advice.  Specific follow-up and call-back criteria were given for patient to follow-up or seek medical care more urgently if needed.   - Time spent in direct consultation with patient on phone: 8 minutes  Nobie Putnam, [facility] 01/19/2020, 2:25 PM

## 2020-02-15 DIAGNOSIS — I272 Pulmonary hypertension, unspecified: Secondary | ICD-10-CM | POA: Diagnosis not present

## 2020-02-23 ENCOUNTER — Ambulatory Visit: Payer: PPO

## 2020-03-01 ENCOUNTER — Ambulatory Visit: Payer: PPO

## 2020-03-10 ENCOUNTER — Other Ambulatory Visit: Payer: Self-pay | Admitting: Family Medicine

## 2020-03-10 DIAGNOSIS — E034 Atrophy of thyroid (acquired): Secondary | ICD-10-CM

## 2020-03-11 ENCOUNTER — Other Ambulatory Visit: Payer: Self-pay | Admitting: Family Medicine

## 2020-03-11 DIAGNOSIS — I10 Essential (primary) hypertension: Secondary | ICD-10-CM

## 2020-03-11 MED ORDER — METOPROLOL TARTRATE 25 MG PO TABS: 12.5000 mg | ORAL_TABLET | Freq: Two times a day (BID) | ORAL | 1 refills | Status: DC

## 2020-03-15 ENCOUNTER — Ambulatory Visit (INDEPENDENT_AMBULATORY_CARE_PROVIDER_SITE_OTHER): Payer: PPO

## 2020-03-15 DIAGNOSIS — Z0000 Encounter for general adult medical examination without abnormal findings: Secondary | ICD-10-CM

## 2020-03-15 NOTE — Progress Notes (Signed)
Subjective:   Jennifer Skinner is a 86 y.o. female who presents for Medicare Annual (Subsequent) preventive examination.  This visit is being conducted via phone call  - after an attmept to do on video chat - due to the COVID-19 pandemic. This patient has given me verbal consent via phone to conduct this visit, patient states they are participating from their home address. Some vital signs may be absent or patient reported.   Patient identification: identified by name, DOB, and current address.     Review of Systems:   Cardiac Risk Factors include: advanced age (>47men, >45 women);dyslipidemia;hypertension     Objective:     Vitals: There were no vitals taken for this visit.  There is no height or weight on file to calculate BMI.  Advanced Directives 03/15/2020 01/07/2018 09/05/2017 09/02/2017 08/26/2017 03/19/2017 03/04/2017  Does Patient Have a Medical Advance Directive? Yes No No Yes Yes Yes No  Type of Advance Directive Living will;Healthcare Power of [place];Living will [place];Living will [place];Living will -  Does patient want to make changes to medical advance directive? - - - - - - -  Copy of [place] in Chart? No - copy requested - - - - - -  Would patient like information on creating a medical advance directive? - No - Patient declined - - - - -    Tobacco Social History   Tobacco Use  Smoking Status Never Smoker  Smokeless Tobacco Never Used     Counseling given: Not Answered   Clinical Intake:                       Past Medical History:  Diagnosis Date  . 1st degree AV block   . Arthritis   . Chronic diastolic CHF (congestive heart failure) ([place])    a. echo 2014: EF 55-60%, nl wall motion, GR1DD, mild MR, moder mitral prolapse, PASP 40 mm Hg; b. echo 09/2015: EF 60-65%, nl wall motion, GR1DD, mod MR, mild to mod TR, PASP 56 mm Hg  . COPD (chronic  obstructive pulmonary disease) ([place])   . Degenerative disc disease, lumbar   . Gastro-esophageal reflux   . Hyperlipidemia   . Mitral prolapse   . Mitral regurgitation   . Osteoporosis    Past Surgical History:  Procedure Laterality Date  . ABDOMINAL HYSTERECTOMY    . APPENDECTOMY    . DILATION AND CURETTAGE OF UTERUS    . HYSTEROTOMY    . right breast biopsy    . right eye      surgery  . TONSILLECTOMY     Family History  Problem Relation Age of Onset  . CVA Mother 12  . Heart attack Father 63  . Throat cancer Sister   . CAD Brother    Social History   Socioeconomic History  . Marital status: Married    Spouse name: Not on file  . Number of children: Not on file  . Years of education: Not on file  . Highest education level: Not on file  Occupational History  . Not on file  Tobacco Use  . Smoking status: Never Smoker  . Smokeless tobacco: Never Used  Substance and Sexual Activity  . Alcohol use: No    Alcohol/week: 0.0 standard drinks  . Drug use: No  . Sexual activity: Never  Other Topics Concern  . Not on file  Social  History Narrative  . Not on file   Social Determinants of Health   Financial Resource Strain:   . Difficulty of Paying Living Expenses:   Food Insecurity:   . Worried About Charity fundraiser in the Last Year:   . Arboriculturist in the Last Year:   Transportation Needs:   . Film/video editor (Medical):   Marland Kitchen Lack of Transportation (Non-Medical):   Physical Activity:   . Days of Exercise per Week:   . Minutes of Exercise per Session:   Stress:   . Feeling of Stress :   Social Connections:   . Frequency of Communication with Friends and Family:   . Frequency of Social Gatherings with Friends and Family:   . Attends Religious Services:   . Active Member of Clubs or Organizations:   . Attends Archivist Meetings:   Marland Kitchen Marital Status:     Outpatient Encounter Medications as of 03/15/2020  Medication Sig  . acetaminophen  (TYLENOL) 500 MG tablet Take 500 mg by mouth every 6 (six) hours as needed.  Marland Kitchen albuterol (PROVENTIL) (2.5 MG/3ML) 0.083% nebulizer solution Take 3 mLs (2.5 mg total) by nebulization every 6 (six) hours as needed for wheezing or shortness of breath.  Marland Kitchen aspirin EC 81 MG tablet Take 1 tablet (81 mg total) by mouth daily.  . Calcium Gluconate 500 MG CAPS Take 2 capsules by mouth daily.   Marland Kitchen gabapentin (NEURONTIN) 100 MG capsule Take 1 capsule (100 mg total) by mouth 3 (three) times daily.  Marland Kitchen levothyroxine (SYNTHROID) 25 MCG tablet Take 1 tablet (25 mcg total) by mouth daily before breakfast.  . metoprolol tartrate (LOPRESSOR) 25 MG tablet Take 0.5 tablets (12.5 mg total) by mouth 2 (two) times daily.  . mirabegron ER (MYRBETRIQ) 25 MG TB24 tablet Take 1 tablet (25 mg total) by mouth daily.  . Multiple Vitamin (MULTI-VITAMIN DAILY PO) Take 1 tablet by mouth daily.   [name] (OYSTER CALCIUM) 500 MG TABS Take 500 mg of elemental calcium by mouth 2 (two) times daily.  . polyethylene glycol powder (GLYCOLAX/MIRALAX) powder Take 17-34 g by mouth daily as needed.  . potassium chloride (KLOR-CON) 10 MEQ tablet Take 1 tablet (10 mEq total) by mouth 3 (three) times daily.  . potassium chloride (KLOR-CON) 10 MEQ tablet   . rosuvastatin (CRESTOR) 10 MG tablet Take 1 tablet (10 mg total) by mouth at bedtime.  . sildenafil (REVATIO) 20 MG tablet Take 1 tablet (20 mg total) by mouth 3 (three) times daily. For pulmonary hypertension  . torsemide (DEMADEX) 20 MG tablet Take 2 tablets (40 mg total) by mouth 2 (two) times daily.  . traMADol (ULTRAM) 50 MG tablet Take two tablets (100 mg total) in am.  Take 1 tablet (50 mg total) every 6 hours as needed for moderate pain up to 3 additional doses per day.   No facility-administered encounter medications on file as of 03/15/2020.    Activities of Daily Living In your present state of health, do you have any difficulty performing the following activities: 03/15/2020  12/08/2019  Hearing? N N  Comment no hearing aids -  Vision? N N  Comment eyeglasses, [facility] -  Difficulty concentrating or making decisions? N N  Walking or climbing stairs? N Y  Dressing or bathing? N N  Doing errands, shopping? Y N  Comment husband drives -  Conservation officer, nature and eating ? N -  Using the Toilet? N -  In  the past six months, have you accidently leaked urine? Y -  Comment pad for protection -  Do you have problems with loss of bowel control? N -  Managing your Medications? N -  Managing your Finances? N -  Housekeeping or managing your Housekeeping? N -  Some recent data might be hidden    Patient Care Team: Olin Hauser, DO as PCP - General (Family Medicine) Alisa Graff, FNP as Nurse Practitioner (Cardiology) Minna Merritts, MD as Consulting Physician (Cardiology)    Assessment:   This is a routine wellness examination for [place].  Exercise Activities and Dietary recommendations Current Exercise Habits: The patient has a physically strenuous job, but has no regular exercise apart from work., Exercise limited by: None identified  Goals Addressed   None     Fall Risk: Fall Risk  03/15/2020 12/08/2019 09/02/2019 07/02/2019 09/23/2018  Falls in the past year? 0 0 1 0 No  Number falls in past yr: 0 0 0 - -  Injury with Fall? 0 0 0 - -  Follow up - Falls evaluation completed - Falls evaluation completed -    FALL RISK PREVENTION PERTAINING TO THE HOME:  Any stairs in or around the home? Yes  back door  If so, are there any without handrails? No   Home free of loose throw rugs in walkways, pet beds, electrical cords, etc? Yes  Adequate lighting in your home to reduce risk of falls? Yes   ASSISTIVE DEVICES UTILIZED TO PREVENT FALLS:  Life alert? No  Use of a cane, walker or w/c? No  Grab bars in the bathroom? No  Shower chair or bench in shower? No  Elevated toilet seat or a handicapped toilet? No   DME ORDERS:  DME  order needed?  No   TIMED UP AND GO:  Unable to perform    Depression Screen PHQ 2/9 Scores 03/15/2020 12/08/2019 09/02/2019 07/02/2019  PHQ - 2 Score 0 0 0 0  PHQ- 9 Score - - 0 -     Cognitive Function     6CIT Screen 03/04/2018  What Year? 0 points  What month? 0 points  What time? 0 points  Count back from 20 0 points  Months in reverse 0 points  Repeat phrase 2 points  Total Score 2    Immunization History  Administered Date(s) Administered  . Fluad Quad(high Dose 65+) 09/02/2019  . Influenza, High Dose Seasonal PF 09/27/2015, 09/25/2016  . Influenza,inj,quad, With Preservative 10/08/2018  . Influenza-Unspecified 09/30/2012, 09/25/2017, 10/07/2018  . Moderna SARS-COVID-2 Vaccination 02/08/2020, 03/10/2020  . Pneumococcal Conjugate-13 05/18/2014  . Pneumococcal Polysaccharide-23 09/30/2012    Qualifies for Shingles Vaccine? Yes  Zostavax completed n/a. Due for Shingrix. Education has been provided regarding the importance of this vaccine. Pt has been advised to call insurance company to determine out of pocket expense. Advised may also receive vaccine at local pharmacy or Health Dept. Verbalized acceptance and understanding.  Tdap: up to date   Flu Vaccine: up to date   Pneumococcal Vaccine: up to date   Covid-19 Vaccine:   Completed vaccines  Screening Tests Health Maintenance  Topic Date Due  . INFLUENZA VACCINE  07/10/2020  . TETANUS/TDAP  10/16/2025  . DEXA SCAN  Completed  . PNA vac Low Risk Adult  Completed    Cancer Screenings:  Colorectal Screening: no longer required   Mammogram: no longer required   Bone Density: Completed 2009  Lung Cancer Screening: (Low Dose CT Chest recommended  if Age 7-80 years, 45 pack-year currently smoking OR have quit w/in 15years.) does not qualify.     Additional Screening:  Hepatitis C Screening: does not qualify  Vision Screening: Recommended annual ophthalmology exams for early detection of glaucoma and  other disorders of the eye. Is the patient up to date with their annual eye exam?  Yes  Who is the provider or what is the name of the office in which the pt attends annual eye exams? [facility] .  Dental Screening: Recommended annual dental exams for proper oral hygiene  Community Resource Referral:  CRR required this visit?  No       Plan:  I have personally reviewed and addressed the Medicare Annual Wellness questionnaire and have noted the following in the patient's chart:  A. Medical and social history B. Use of alcohol, tobacco or illicit drugs  C. Current medications and supplements D. Functional ability and status E.  Nutritional status F.  Physical activity G. Advance directives H. List of other physicians I.  Hospitalizations, surgeries, and ER visits in previous 12 months J.  [place] such as hearing and vision if needed, cognitive and depression L. Referrals and appointments   In addition, I have reviewed and discussed with patient certain preventive protocols, quality metrics, and best practice recommendations. A written personalized care plan for preventive services as well as general preventive health recommendations were provided to patient.  Signed,    Bevelyn Ngo, LPN  [id number] Nurse Health Advisor   Nurse Notes: none

## 2020-03-15 NOTE — Patient Instructions (Signed)
Jennifer Skinner , Thank you for taking time to come for your Medicare Wellness Visit. I appreciate your ongoing commitment to your health goals. Please review the following plan we discussed and let me know if I can assist you in the future.   Screening recommendations/referrals: Colonoscopy:no longer required Mammogram: no longer required Bone Density: completed 2009 Recommended yearly ophthalmology/optometry visit for glaucoma screening and checkup Recommended yearly dental visit for hygiene and checkup  Vaccinations: Influenza vaccine: up to date  Pneumococcal vaccine: up to date  Tdap vaccine: up to date  Shingles vaccine: shingrix eligible   Covid-19: completed   Advanced directives: Please bring a copy of your health care power of attorney and living will to the office at your convenience.  Conditions/risks identified: none   Next appointment: Follow up in one year for your annual wellness visit.    Preventive Care 2 Years and Older, Female Preventive care refers to lifestyle choices and visits with your health care provider that can promote health and wellness. What does preventive care include?  A yearly physical exam. This is also called an annual well check.  Dental exams once or twice a year.  Routine eye exams. Ask your health care provider how often you should have your eyes checked.  Personal lifestyle choices, including:  Daily care of your teeth and gums.  Regular physical activity.  Eating a healthy diet.  Avoiding tobacco and drug use.  Limiting alcohol use.  Practicing safe sex.  Taking low-dose aspirin every day.  Taking vitamin and mineral supplements as recommended by your health care provider. What happens during an annual well check? The services and screenings done by your health care provider during your annual well check will depend on your age, overall health, lifestyle risk factors, and family history of disease. Counseling  Your health  care provider may ask you questions about your:  Alcohol use.  Tobacco use.  Drug use.  Emotional well-being.  Home and relationship well-being.  Sexual activity.  Eating habits.  History of falls.  Memory and ability to understand (cognition).  Work and work Statistician.  Reproductive health. Screening  You may have the following tests or measurements:  Height, weight, and BMI.  Blood pressure.  Lipid and cholesterol levels. These may be checked every 5 years, or more frequently if you are over 63 years old.  Skin check.  Lung cancer screening. You may have this screening every year starting at age 3 if you have a 30-pack-year history of smoking and currently smoke or have quit within the past 15 years.  Fecal occult blood test (FOBT) of the stool. You may have this test every year starting at age 80.  Flexible sigmoidoscopy or colonoscopy. You may have a sigmoidoscopy every 5 years or a colonoscopy every 10 years starting at age 24.  Hepatitis C blood test.  Hepatitis B blood test.  Sexually transmitted disease (STD) testing.  Diabetes screening. This is done by checking your blood sugar (glucose) after you have not eaten for a while (fasting). You may have this done every 1-3 years.  Bone density scan. This is done to screen for osteoporosis. You may have this done starting at age 33.  Mammogram. This may be done every 1-2 years. Talk to your health care provider about how often you should have regular mammograms. Talk with your health care provider about your test results, treatment options, and if necessary, the need for more tests. Vaccines  Your health care provider may recommend  certain vaccines, such as:  Influenza vaccine. This is recommended every year.  Tetanus, diphtheria, and acellular pertussis (Tdap, Td) vaccine. You may need a Td booster every 10 years.  Zoster vaccine. You may need this after age 52.  Pneumococcal 13-valent conjugate  (PCV13) vaccine. One dose is recommended after age 56.  Pneumococcal polysaccharide (PPSV23) vaccine. One dose is recommended after age 34. Talk to your health care provider about which screenings and vaccines you need and how often you need them. This information is not intended to replace advice given to you by your health care provider. Make sure you discuss any questions you have with your health care provider. Document Released: 12/23/2015 Document Revised: 08/15/2016 Document Reviewed: 09/27/2015 Elsevier Interactive Patient Education  2017 [place] Prevention in the Home Falls can cause injuries. They can happen to people of all ages. There are many things you can do to make your home safe and to help prevent falls. What can I do on the outside of my home?  Regularly fix the edges of walkways and driveways and fix any cracks.  Remove anything that might make you trip as you walk through a door, such as a raised step or threshold.  Trim any bushes or trees on the path to your home.  Use bright outdoor lighting.  Clear any walking paths of anything that might make someone trip, such as rocks or tools.  Regularly check to see if handrails are loose or broken. Make sure that both sides of any steps have handrails.  Any raised decks and porches should have guardrails on the edges.  Have any leaves, snow, or ice cleared regularly.  Use sand or salt on walking paths during winter.  Clean up any spills in your garage right away. This includes oil or grease spills. What can I do in the bathroom?  Use night lights.  Install grab bars by the toilet and in the tub and shower. Do not use towel bars as grab bars.  Use non-skid mats or decals in the tub or shower.  If you need to sit down in the shower, use a plastic, non-slip stool.  Keep the floor dry. Clean up any water that spills on the floor as soon as it happens.  Remove soap buildup in the tub or shower  regularly.  Attach bath mats securely with double-sided non-slip rug tape.  Do not have throw rugs and other things on the floor that can make you trip. What can I do in the bedroom?  Use night lights.  Make sure that you have a light by your bed that is easy to reach.  Do not use any sheets or blankets that are too big for your bed. They should not hang down onto the floor.  Have a firm chair that has side arms. You can use this for support while you get dressed.  Do not have throw rugs and other things on the floor that can make you trip. What can I do in the kitchen?  Clean up any spills right away.  Avoid walking on wet floors.  Keep items that you use a lot in easy-to-reach places.  If you need to reach something above you, use a strong step stool that has a grab bar.  Keep electrical cords out of the way.  Do not use floor polish or wax that makes floors slippery. If you must use wax, use non-skid floor wax.  Do not have throw rugs and other  things on the floor that can make you trip. What can I do with my stairs?  Do not leave any items on the stairs.  Make sure that there are handrails on both sides of the stairs and use them. Fix handrails that are broken or loose. Make sure that handrails are as long as the stairways.  Check any carpeting to make sure that it is firmly attached to the stairs. Fix any carpet that is loose or worn.  Avoid having throw rugs at the top or bottom of the stairs. If you do have throw rugs, attach them to the floor with carpet tape.  Make sure that you have a light switch at the top of the stairs and the bottom of the stairs. If you do not have them, ask someone to add them for you. What else can I do to help prevent falls?  Wear shoes that:  Do not have high heels.  Have rubber bottoms.  Are comfortable and fit you well.  Are closed at the toe. Do not wear sandals.  If you use a stepladder:  Make sure that it is fully  opened. Do not climb a closed stepladder.  Make sure that both sides of the stepladder are locked into place.  Ask someone to hold it for you, if possible.  Clearly mark and make sure that you can see:  Any grab bars or handrails.  First and last steps.  Where the edge of each step is.  Use tools that help you move around (mobility aids) if they are needed. These include:  Canes.  Walkers.  Scooters.  Crutches.  Turn on the lights when you go into a dark area. Replace any light bulbs as soon as they burn out.  Set up your furniture so you have a clear path. Avoid moving your furniture around.  If any of your floors are uneven, fix them.  If there are any pets around you, be aware of where they are.  Review your medicines with your doctor. Some medicines can make you feel dizzy. This can increase your chance of falling. Ask your doctor what other things that you can do to help prevent falls. This information is not intended to replace advice given to you by your health care provider. Make sure you discuss any questions you have with your health care provider. Document Released: 09/22/2009 Document Revised: 05/03/2016 Document Reviewed: 12/31/2014 Elsevier Interactive Patient Education  2017 [employer].

## 2020-03-17 DIAGNOSIS — I272 Pulmonary hypertension, unspecified: Secondary | ICD-10-CM | POA: Diagnosis not present

## 2020-04-13 ENCOUNTER — Other Ambulatory Visit: Payer: Self-pay | Admitting: Family Medicine

## 2020-04-13 DIAGNOSIS — I272 Pulmonary hypertension, unspecified: Secondary | ICD-10-CM

## 2020-04-13 NOTE — Telephone Encounter (Signed)
Requested Prescriptions  Pending Prescriptions Disp Refills  . sildenafil (REVATIO) 20 MG tablet [Pharmacy Med Name: SILDENAFIL 20 MG TABLET] 270 tablet 1    Sig: Take 1 tablet (20 mg total) by mouth 3 (three) times daily. For pulmonary hypertension     Urology: Erectile Dysfunction Agents Passed - 04/13/2020  1:00 PM      Passed - Last BP in normal range    BP Readings from Last 1 Encounters:  09/02/19 105/86         Passed - Valid encounter within last 12 months    Recent Outpatient Visits          2 months ago Epistaxis   [place], DO   4 months ago Essential hypertension   [place], DO   7 months ago Chronic left hip pain   [place], DO   9 months ago Chronic left hip pain   [place], DO   1 year ago Chronic midline low back pain without sciatica   [place], Devonne Doughty, DO

## 2020-04-16 DIAGNOSIS — I272 Pulmonary hypertension, unspecified: Secondary | ICD-10-CM | POA: Diagnosis not present

## 2020-04-21 ENCOUNTER — Other Ambulatory Visit: Payer: Self-pay | Admitting: Family Medicine

## 2020-04-21 DIAGNOSIS — M48062 Spinal stenosis, lumbar region with neurogenic claudication: Secondary | ICD-10-CM

## 2020-04-21 DIAGNOSIS — G8929 Other chronic pain: Secondary | ICD-10-CM

## 2020-04-21 MED ORDER — TRAMADOL HCL 50 MG PO TABS: ORAL_TABLET | ORAL | 2 refills | Status: DC

## 2020-04-21 NOTE — Telephone Encounter (Signed)
Medication Refill - Medication: traMADol (ULTRAM) 50 MG tablet   Has the patient contacted their pharmacy? Yes.   (Agent: If no, request that the patient contact the pharmacy for the refill.) (Agent: If yes, when and what did the pharmacy advise?)  Preferred Pharmacy (with phone number or street name):  [facility] [place], [address]  [place] [address]  Phone: [phone] Fax: [phone]     Agent: Please be advised that RX refills may take up to 3 business days. We ask that you follow-up with your pharmacy.

## 2020-05-05 ENCOUNTER — Other Ambulatory Visit: Payer: Self-pay

## 2020-05-05 ENCOUNTER — Encounter: Payer: Self-pay | Admitting: Family Medicine

## 2020-05-05 ENCOUNTER — Ambulatory Visit (INDEPENDENT_AMBULATORY_CARE_PROVIDER_SITE_OTHER): Payer: PPO | Admitting: Family Medicine

## 2020-05-05 VITALS — BP 111/54 | HR 77 | Temp 97.7°F | Resp 16 | Ht <= 58 in | Wt 81.6 lb

## 2020-05-05 DIAGNOSIS — N3281 Overactive bladder: Secondary | ICD-10-CM | POA: Diagnosis not present

## 2020-05-05 DIAGNOSIS — N3000 Acute cystitis without hematuria: Secondary | ICD-10-CM | POA: Diagnosis not present

## 2020-05-05 LAB — POCT URINALYSIS DIPSTICK
Bilirubin, UA: NEGATIVE
Blood, UA: POSITIVE
Glucose, UA: NEGATIVE
Ketones, UA: NEGATIVE
Nitrite, UA: POSITIVE
Protein, UA: POSITIVE — AB
Spec Grav, UA: 1.01 (ref 1.010–1.025)
Urobilinogen, UA: NEGATIVE E.U./dL — AB
pH, UA: 5 (ref 5.0–8.0)

## 2020-05-05 MED ORDER — CIPROFLOXACIN HCL 500 MG PO TABS: 500.0000 mg | ORAL_TABLET | Freq: Two times a day (BID) | ORAL | 0 refills | Status: DC

## 2020-05-05 MED ORDER — MIRABEGRON ER 25 MG PO TB24: 25.0000 mg | ORAL_TABLET | Freq: Every day | ORAL | 11 refills | Status: DC

## 2020-05-05 NOTE — Patient Instructions (Addendum)
Thank you for coming to the office today.  RESTART Myrbetriq once daily for Over Active Bladder / Incontinence  1. You have a Urinary Tract Infection - this is very common, your symptoms are reassuring and you should get better within 1 week on the antibiotics - Start Cipro 500mg  2 times daily for next 5 days, complete entire course, even if feeling better - We sent urine for a culture, we will call you within next few days if we need to change antibiotics - Please drink plenty of fluids, improve hydration over next 1 week  If symptoms worsening, developing nausea / vomiting, worsening back pain, fevers / chills / sweats, then please return for re-evaluation sooner.   Please schedule a Follow-up Appointment to: Return in about 1 week (around 05/12/2020), or if symptoms worsen or fail to improve, for UTI.  If you have any other questions or concerns, please feel free to call the office or send a message through [place]. You may also schedule an earlier appointment if necessary.  Additionally, you may be receiving a survey about your experience at our office within a few days to 1 week by e-mail or mail. We value your feedback.  Nobie Putnam, DO [place]

## 2020-05-05 NOTE — Progress Notes (Signed)
Subjective:    Patient ID: Jennifer Skinner, female    DOB: October 01, 1931, 86 y.o.   MRN: [id number]  Jennifer Skinner is a 86 y.o. female presenting on 05/05/2020 for Urinary Tract Infection (onset 3 days painful, frequency and hesitancy to urinate denies abdominal or back pain) and Urinary Incontinence  Patient presents for a same day appointment.  HPI   UTI / Dysuria / Urinary Urgency Incontinence Reports chronic history of urinary urgency and incontinence. Recent in 3 days worsening with dysuria now. She has history of diastolic CHF, followed by Cardiology Torsemide twice a day, says that this makes her urinate more, otherwise reduced amount. Prior UTI feels similar Known history incomplete emptying, OAB. She was on Myrbetriq in past with good results, does not know why she stopped, may have run out of refills. She does not know names of meds, and does not recall Denies any fevers chills sweats nausea vomiting blood in urine   Depression screen [facility] 2/9 03/15/2020 12/08/2019 09/02/2019  Decreased Interest 0 0 0  Down, Depressed, Hopeless 0 0 0  PHQ - 2 Score 0 0 0  Altered sleeping - - 0  Tired, decreased energy - - 0  Change in appetite - - 0  Feeling bad or failure about yourself  - - 0  Trouble concentrating - - 0  Moving slowly or fidgety/restless - - 0  Suicidal thoughts - - 0  PHQ-9 Score - - 0  Difficult doing work/chores - - -    Social History   Tobacco Use  . Smoking status: Never Smoker  . Smokeless tobacco: Never Used  Substance Use Topics  . Alcohol use: No    Alcohol/week: 0.0 standard drinks  . Drug use: No    Review of Systems Per HPI unless specifically indicated above     Objective:    BP (!) 111/54   Pulse 77   Temp 97.7 F (36.5 C) (Temporal)   Resp 16   Ht 4\' 8"  (1.422 m)   Wt 81 lb 9.6 oz (37 kg)   SpO2 98% Comment: 3 liters of oxygen  BMI 18.29 kg/m   Wt Readings from Last 3 Encounters:  05/05/20 81 lb 9.6 oz (37 kg)  09/02/19 81 lb  9.6 oz (37 kg)  10/23/18 88 lb (39.9 kg)    Physical Exam Vitals and nursing note reviewed.  Constitutional:      General: She is not in acute distress.    Appearance: She is well-developed. She is not diaphoretic.     Comments: Well-appearing chronically ill elderly 86 yr old thin female comfortable, cooperative  HENT:     Head: Normocephalic and atraumatic.  Eyes:     General:        Right eye: No discharge.        Left eye: No discharge.     Conjunctiva/sclera: Conjunctivae normal.  Cardiovascular:     Rate and Rhythm: Normal rate.  Pulmonary:     Effort: Pulmonary effort is normal.     Comments: On [place] supplemental oxygen Skin:    General: Skin is warm and dry.     Findings: No erythema or rash.  Neurological:     Mental Status: She is alert and oriented to person, place, and time.  Psychiatric:        Behavior: Behavior normal.     Comments: Well groomed, good eye contact, normal speech and thoughts       Results for orders placed  or performed in visit on 05/05/20  POCT urinalysis dipstick  Result Value Ref Range   Color, UA amber    Clarity, UA cloudy    Glucose, UA Negative Negative   Bilirubin, UA neg    Ketones, UA neg    Spec Grav, UA 1.020 1.010 - 1.025   Blood, UA negative    pH, UA 7.0 5.0 - 8.0   Protein, UA Positive (A) Negative   Urobilinogen, UA negative (A) 0.2 or 1.0 E.U./dL   Nitrite, UA positive    Leukocytes, UA Large (3+) (A) Negative   Appearance     Odor        Assessment & Plan:   Problem List Items Addressed This Visit    OAB (overactive bladder)   Relevant Medications   mirabegron ER (MYRBETRIQ) 25 MG TB24 tablet    Other Visit Diagnoses    Acute cystitis without hematuria    -  Primary   Relevant Medications   ciprofloxacin (CIPRO) 500 MG tablet   Other Relevant Orders   POCT urinalysis dipstick (Completed)   Urine Culture      Clinically consistent with UTI and confirmed on UA. No recent UTIs or abx courses.  Prior  similar E Coli UTI Allergy to PCN Tolerated Cipro in past Also with co morbid OAB Urinary Urgency Incontinence No concern for pyelo today (no systemic symptoms, neg fever, back pain, n/v).  Plan: 1. Ordered Urine culture 2. Cipro 500 BID x 5 days 3. Restart Myrbetriq 25mg  daily - discussed importance of treating bladder dysfunction to avoid UTI. May refer to Urology in future if indicated  RTC if no improvement 1-2 weeks, red flags given to return sooner   Meds ordered this encounter  Medications  . mirabegron ER (MYRBETRIQ) 25 MG TB24 tablet    Sig: Take 1 tablet (25 mg total) by mouth daily.    Dispense:  30 tablet    Refill:  11  . DISCONTD: ciprofloxacin (CIPRO) 500 MG tablet    Sig: Take 1 tablet (500 mg total) by mouth 2 (two) times daily. One po bid x 7 days    Dispense:  10 tablet    Refill:  0  . ciprofloxacin (CIPRO) 500 MG tablet    Sig: Take 1 tablet (500 mg total) by mouth 2 (two) times daily. 5 days    Dispense:  10 tablet    Refill:  0      Follow up plan: Return in about 1 week (around 05/12/2020), or if symptoms worsen or fail to improve, for UTI.   Nobie Putnam, [facility] 05/05/2020, 11:10 AM

## 2020-05-06 LAB — URINE CULTURE
MICRO NUMBER:: 10528054
SPECIMEN QUALITY:: ADEQUATE

## 2020-05-10 ENCOUNTER — Telehealth: Payer: Self-pay

## 2020-05-10 ENCOUNTER — Other Ambulatory Visit: Payer: Self-pay | Admitting: Family Medicine

## 2020-05-10 DIAGNOSIS — N3000 Acute cystitis without hematuria: Secondary | ICD-10-CM

## 2020-05-10 MED ORDER — SULFAMETHOXAZOLE-TRIMETHOPRIM 800-160 MG PO TABS: 1.0000 | ORAL_TABLET | Freq: Two times a day (BID) | ORAL | 0 refills | Status: AC

## 2020-05-10 NOTE — Telephone Encounter (Signed)
I called patient and told her that Dr. Raliegh Ip has the request for the medication.  She is still wanting to talk to Dr. Raliegh Ip about her problems.  I advised the patient that Dr. Raliegh Ip is very busy seeing patients and may not be able to call back till after clinic today and if she need to talk to him in depth, she would need to make an appointment.

## 2020-05-10 NOTE — Telephone Encounter (Signed)
Medication Refill - Medication:  ciprofloxacin (CIPRO) 500 MG tablet   Has the patient contacted their pharmacy?  Yes. Patient is out of medication but still having UTI symptoms. Please advise.   Preferred Pharmacy (with phone number or street name):  [facility], [place] Phone:  [phone]  Fax:  [phone]     Agent: Please be advised that RX refills may take up to 3 business days. We ask that you follow-up with your pharmacy.

## 2020-05-10 NOTE — Telephone Encounter (Signed)
Explain patient about Abx changes she understood very well no further question.

## 2020-05-10 NOTE — Telephone Encounter (Signed)
Requested medication (s) are due for refill today: no  Requested medication (s) are on the active medication list: yes  Last refill:  05/05/2020  Future visit scheduled:no  Notes to clinic:  Patient is out of medication but still having UTI symptoms. Please    Requested Prescriptions  Pending Prescriptions Disp Refills   ciprofloxacin (CIPRO) 500 MG tablet 10 tablet 0    Sig: Take 1 tablet (500 mg total) by mouth 2 (two) times daily. 5 days      Off-Protocol Failed - 05/10/2020  8:44 AM      Failed - Medication not assigned to a protocol, review manually.      Passed - Valid encounter within last 12 months    Recent Outpatient Visits           5 days ago Acute cystitis without hematuria   [place], DO   3 months ago Epistaxis   [place], DO   5 months ago Essential hypertension   [place], DO   8 months ago Chronic left hip pain   [place], DO   10 months ago Chronic left hip pain   [place], Devonne Doughty, [place]

## 2020-05-10 NOTE — Telephone Encounter (Signed)
Thanks. I have just responded to result note. Here is copy of that note  I checked previous urine culture from 2018, there was history of some cipro resistance. Since not resolved - I will send new rx antibiotic Bactrim 1 tablet twice a day for 7 days to [facility]. She can finish this one, and continue Myrbetriq. If still not improved we can then consider Urology.  Nobie Putnam, [facility] 05/10/2020, 12:10 PM

## 2020-05-10 NOTE — Telephone Encounter (Signed)
See other result note message was concerned about finishing Cipro and still has Sxs left sometime while urinating but not ready for referral, she did not know if she needed another round of Abx ?

## 2020-05-10 NOTE — Telephone Encounter (Signed)
Copied from [place] [id number]. Topic: General - Inquiry >> May 10, 2020  9:16 AM Mathis Bud wrote: Reason for CRM: Patient is requesting a call from PCP. Patient did not disclose what it was regarding too. Call back [phone]

## 2020-05-10 NOTE — Addendum Note (Signed)
Addended by: Olin Hauser on: 05/10/2020 12:04 PM   Modules accepted: Orders

## 2020-05-10 NOTE — Telephone Encounter (Signed)
Patient is checking status on medication refill.

## 2020-05-10 NOTE — Telephone Encounter (Signed)
Patient is requesting Caryl Pina to call back regarding this medication. Patient is confused if she should be taking medication or not.

## 2020-05-17 DIAGNOSIS — I272 Pulmonary hypertension, unspecified: Secondary | ICD-10-CM | POA: Diagnosis not present

## 2020-05-20 ENCOUNTER — Encounter: Payer: Self-pay | Admitting: Family Medicine

## 2020-05-20 ENCOUNTER — Other Ambulatory Visit: Payer: Self-pay

## 2020-05-20 ENCOUNTER — Ambulatory Visit (INDEPENDENT_AMBULATORY_CARE_PROVIDER_SITE_OTHER): Payer: PPO | Admitting: Family Medicine

## 2020-05-20 DIAGNOSIS — M25552 Pain in left hip: Secondary | ICD-10-CM | POA: Diagnosis not present

## 2020-05-20 DIAGNOSIS — G8929 Other chronic pain: Secondary | ICD-10-CM

## 2020-05-20 DIAGNOSIS — M160 Bilateral primary osteoarthritis of hip: Secondary | ICD-10-CM | POA: Diagnosis not present

## 2020-05-20 NOTE — Progress Notes (Signed)
Virtual Visit via Telephone The purpose of this virtual visit is to provide medical care while limiting exposure to the novel coronavirus (COVID19) for both patient and office staff.  Consent was obtained for phone visit:  Yes.   Answered questions that patient had about telehealth interaction:  Yes.   I discussed the limitations, risks, security and privacy concerns of performing an evaluation and management service by telephone. I also discussed with the patient that there may be a patient responsible charge related to this service. The patient expressed understanding and agreed to proceed.  Patient Location: Home Provider Location: [facility])  ---------------------------------------------------------------------- Chief Complaint  Patient presents with  . Hip Pain    S: Reviewed CMA documentation. I have called patient and gathered additional HPI as follows:  LEFT HIP PAIN, CHRONIC / Osteoarthritis Last dedicated visit to L hip pain was 08/2019, Tramadol was increased and she was given L hip injection greater trochanter bursitis, limited results, next month she was referred to [facility] Orthopedic Dr Candelaria Stagers and had evaluation, she has complicated OA/DJD history of spine as well in past with Dr Sharlet [place] and injections, limited results. She was advised future physical therapy and home exercises and medicines. - Today she reports overall she is still having persistent L hip pain and stiffness. She says has flare up worse with activity and walking. Worse in am at times then worse by evening after walking. - She is taking Tramadol 50mg  x 2 in AM for 100mg  then 3 additional doses throughout day, with some benefit of reducingpain - now adhering to this dosing better. In past she would not take full dose - She takes Tylenol Ext Str 500mg  1-2 times a day only, has not inc dose due to nose bleed. - She still takes Aspirin 81mg  daily due to history of eye hemorrhage in  past. They had her on full aspirin 325 then cardiology Dr Rockey Situ reduced her to 81mg  - Taking Gabapentin 100mg  TIDalready Denies any fall or injury or redness swelling  Denies any fevers, chills, sweats, body ache, cough, shortness of breath, sinus pain or pressure, headache, abdominal pain, diarrhea  Past Medical History:  Diagnosis Date  . 1st degree AV block   . Arthritis   . Chronic diastolic CHF (congestive heart failure) ([place])    a. echo 2014: EF 55-60%, nl wall motion, GR1DD, mild MR, moder mitral prolapse, PASP 40 mm Hg; b. echo 09/2015: EF 60-65%, nl wall motion, GR1DD, mod MR, mild to mod TR, PASP 56 mm Hg  . COPD (chronic obstructive pulmonary disease) ([place])   . Degenerative disc disease, lumbar   . Gastro-esophageal reflux   . Hyperlipidemia   . Mitral prolapse   . Mitral regurgitation   . Osteoporosis    Social History   Tobacco Use  . Smoking status: Never Smoker  . Smokeless tobacco: Never Used  Vaping Use  . Vaping Use: Never used  Substance Use Topics  . Alcohol use: No    Alcohol/week: 0.0 standard drinks  . Drug use: No    Current Outpatient Medications:  .  acetaminophen (TYLENOL) 500 MG tablet, Take 500 mg by mouth every 6 (six) hours as needed., Disp: , Rfl:  .  albuterol (PROVENTIL) (2.5 MG/3ML) 0.083% nebulizer solution, Take 3 mLs (2.5 mg total) by nebulization every 6 (six) hours as needed for wheezing or shortness of breath., Disp: 75 mL, Rfl: 12 .  aspirin EC 81 MG tablet, Take 1 tablet (81 mg total)  by mouth daily., Disp: 90 tablet, Rfl: 3 .  Calcium Gluconate 500 MG CAPS, Take 2 capsules by mouth daily. , Disp: , Rfl:  .  ciprofloxacin (CIPRO) 500 MG tablet, Take 1 tablet (500 mg total) by mouth 2 (two) times daily. 5 days, Disp: 10 tablet, Rfl: 0 .  gabapentin (NEURONTIN) 100 MG capsule, Take 1 capsule (100 mg total) by mouth 3 (three) times daily., Disp: 270 capsule, Rfl: 2 .  levothyroxine (SYNTHROID) 25 MCG tablet, Take 1 tablet (25 mcg  total) by mouth daily before breakfast., Disp: 90 tablet, Rfl: 0 .  metoprolol tartrate (LOPRESSOR) 25 MG tablet, Take 0.5 tablets (12.5 mg total) by mouth 2 (two) times daily., Disp: 90 tablet, Rfl: 1 .  mirabegron ER (MYRBETRIQ) 25 MG TB24 tablet, Take 1 tablet (25 mg total) by mouth daily., Disp: 30 tablet, Rfl: 11 .  Multiple Vitamin (MULTI-VITAMIN DAILY PO), Take 1 tablet by mouth daily. , Disp: , Rfl:  .  Oyster Shell (OYSTER CALCIUM) 500 MG TABS, Take 500 mg of elemental calcium by mouth 2 (two) times daily., Disp: , Rfl:  .  polyethylene glycol powder (GLYCOLAX/MIRALAX) powder, Take 17-34 g by mouth daily as needed., Disp: 250 g, Rfl: 2 .  potassium chloride (KLOR-CON) 10 MEQ tablet, Take 1 tablet (10 mEq total) by mouth 3 (three) times daily., Disp: 270 tablet, Rfl: 1 .  potassium chloride (KLOR-CON) 10 MEQ tablet, , Disp: , Rfl:  .  rosuvastatin (CRESTOR) 10 MG tablet, Take 1 tablet (10 mg total) by mouth at bedtime., Disp: 90 tablet, Rfl: 3 .  sildenafil (REVATIO) 20 MG tablet, Take 1 tablet (20 mg total) by mouth 3 (three) times daily. For pulmonary hypertension, Disp: 270 tablet, Rfl: 1 .  torsemide (DEMADEX) 20 MG tablet, Take 2 tablets (40 mg total) by mouth 2 (two) times daily., Disp: 120 tablet, Rfl: 5 .  traMADol (ULTRAM) 50 MG tablet, Take two tablets (100 mg total) in am.  Take 1 tablet (50 mg total) every 6 hours as needed for moderate pain up to 3 additional doses per day., Disp: 150 tablet, Rfl: 2  Depression screen [facility] 2/9 03/15/2020 12/08/2019 09/02/2019  Decreased Interest 0 0 0  Down, Depressed, Hopeless 0 0 0  PHQ - 2 Score 0 0 0  Altered sleeping - - 0  Tired, decreased energy - - 0  Change in appetite - - 0  Feeling bad or failure about yourself  - - 0  Trouble concentrating - - 0  Moving slowly or fidgety/restless - - 0  Suicidal thoughts - - 0  PHQ-9 Score - - 0  Difficult doing work/chores - - -    GAD 7 : Generalized Anxiety Score 09/04/2016  Nervous,  Anxious, on Edge 0  Control/stop worrying 0  Worry too much - different things 0  Trouble relaxing 0  Restless 0  Easily annoyed or irritable 0  Afraid - awful might happen 0  Total GAD 7 Score 0  Anxiety Difficulty Not difficult at all    -------------------------------------------------------------------------- O: No physical exam performed due to remote telephone encounter.  Lab results reviewed.  Recent Results (from the past 2160 hour(s))  Urine Culture     Status: None   Collection Time: 05/05/20 11:44 AM   Specimen: Urine  Result Value Ref Range   MICRO NUMBER: [id number]    SPECIMEN QUALITY: Adequate    Sample Source URINE, CLEAN CATCH    STATUS: FINAL    ISOLATE 1:  Growth of mixed flora was isolated, suggesting probable contamination. No further testing will be performed. If clinically indicated, recollection using a method to minimize contamination, with prompt transfer to Urine Culture Transport Tube, is  recommended.   POCT urinalysis dipstick     Status: Abnormal   Collection Time: 05/05/20  2:55 PM  Result Value Ref Range   Color, UA amber    Clarity, UA cloudy    Glucose, UA Negative Negative   Bilirubin, UA neg    Ketones, UA neg    Spec Grav, UA 1.010 1.010 - 1.025   Blood, UA positive    pH, UA 5.0 5.0 - 8.0   Protein, UA Positive (A) Negative   Urobilinogen, UA negative (A) 0.2 or 1.0 E.U./dL   Nitrite, UA positive    Leukocytes, UA Large (3+) (A) Negative   Appearance     Odor      -------------------------------------------------------------------------- A&P:  Problem List Items Addressed This Visit    Primary osteoarthritis of both hips   Chronic left hip pain - Primary     Subacute on chronic gradual worsening problem with L Hip pain, likely underlying OA/DJD and some combination of Lumbar spinal stenosis as well. - previously with [facility] pain management injection therapy for spine and Orthopedics with limited improvement. On  advanced medication management with some relief. Still has functional difficulties Age 55 frail elderly patient with other comorbid conditions impacting her muscle strength and function overall can increase burden of her hip pain - No radiation of pain or radicular symptoms but has some lower extremity tingling episodic  S/p L hip greater trochanteric injection by me 08/2019- limited result  Plan: Discussion on med dosing again today - CONTINUE Tramadol dosing 5 x daily, usually take 2 in AM and 3 throughout rest of day - INCREASE Tylenol Ext Str 500mg  x 2 = 1000mg  TID. She should SKIP or HOLD Aspirin 81 on days that she is taking more Tylenol. To avoid nose bleed. -Advised she may DC aspirin in future, she is on statin and age 24 may be wise to avoid this in future, she would like to keep it if needed.  Use Gabapentin, Tylenol, Heating pad  May return to Orthopedics for Physical therapy or we can work on ordering.  No orders of the defined types were placed in this encounter.   Follow-up: - Return as needed. May return to Orthopedics [address] Candelaria Stagers)  Patient verbalizes understanding with the above medical recommendations including the limitation of remote medical advice.  Specific follow-up and call-back criteria were given for patient to follow-up or seek medical care more urgently if needed.   - Time spent in direct consultation with patient on phone: 7 minutes   Nobie Putnam, [facility] 05/20/2020, 3:46 PM

## 2020-05-27 ENCOUNTER — Other Ambulatory Visit: Payer: Self-pay | Admitting: Family Medicine

## 2020-05-27 DIAGNOSIS — I5032 Chronic diastolic (congestive) heart failure: Secondary | ICD-10-CM

## 2020-05-27 DIAGNOSIS — I890 Lymphedema, not elsewhere classified: Secondary | ICD-10-CM

## 2020-06-02 ENCOUNTER — Other Ambulatory Visit: Payer: Self-pay | Admitting: Family Medicine

## 2020-06-02 DIAGNOSIS — I10 Essential (primary) hypertension: Secondary | ICD-10-CM

## 2020-06-02 NOTE — Telephone Encounter (Signed)
Requested Prescriptions  Pending Prescriptions Disp Refills  . potassium chloride (KLOR-CON) 10 MEQ tablet [Pharmacy Med Name: POTASSIUM CL ER 10 MEQ TABLET] 270 tablet 0    Sig: Take 1 tablet (10 mEq total) by mouth 3 (three) times daily.     Endocrinology:  Minerals - Potassium Supplementation Passed - 06/02/2020 12:44 PM      Passed - K in normal range and within 360 days    Potassium  Date Value Ref Range Status  08/26/2019 4.2 3.5 - 5.3 mmol/L Final  09/12/2013 4.0 3.5 - 5.1 mmol/L Final         Passed - Cr in normal range and within 360 days    Creat  Date Value Ref Range Status  08/26/2019 0.65 0.60 - 0.88 mg/dL Final    Comment:    For patients >36 years of age, the reference limit for Creatinine is approximately 13% higher for people identified as African-American. Renella Cunas - Valid encounter within last 12 months    Recent Outpatient Visits          1 week ago Chronic left hip pain   [place], DO   4 weeks ago Acute cystitis without hematuria   [place], DO   4 months ago Epistaxis   [place], DO   5 months ago Essential hypertension   [place], DO   9 months ago Chronic left hip pain   [place], Devonne Doughty, [place]

## 2020-06-06 ENCOUNTER — Other Ambulatory Visit: Payer: Self-pay | Admitting: Family Medicine

## 2020-06-06 DIAGNOSIS — E034 Atrophy of thyroid (acquired): Secondary | ICD-10-CM

## 2020-06-06 NOTE — Telephone Encounter (Signed)
Requested Prescriptions  Pending Prescriptions Disp Refills  . potassium chloride (KLOR-CON) 10 MEQ tablet [Pharmacy Med Name: POTASSIUM CL ER 10 MEQ TABLET] 270 tablet 0    Sig: Take 1 tablet (10 mEq total) by mouth 3 (three) times daily.     Endocrinology:  Minerals - Potassium Supplementation Passed - 06/06/2020  9:11 AM      Passed - K in normal range and within 360 days    Potassium  Date Value Ref Range Status  08/26/2019 4.2 3.5 - 5.3 mmol/L Final  09/12/2013 4.0 3.5 - 5.1 mmol/L Final         Passed - Cr in normal range and within 360 days    Creat  Date Value Ref Range Status  08/26/2019 0.65 0.60 - 0.88 mg/dL Final    Comment:    For patients >55 years of age, the reference limit for Creatinine is approximately 13% higher for people identified as African-American. Renella Cunas - Valid encounter within last 12 months    Recent Outpatient Visits          2 weeks ago Chronic left hip pain   [facility] [place], Devonne Dought[place], DO   1 month ago Acute cystitis without hematuria   [facility] [place], Devonne Dought[place], DO   4 months ago Epistaxis   [facility] [place], Devonne Doughty, DO   6 months ago Essential hypertension   [place], DO   9 months ago Chronic left hip pain   [facility] [place], Devonne Doughty, DO             . levothyroxine (SYNTHROID) 25 MCG tablet [Pharmacy Med Name: LEVOTHYROXINE 25 MCG TABLET] 90 tablet 0    Sig: Take 1 tablet (25 mcg total) by mouth daily before breakfast.     Endocrinology:  Hypothyroid Agents Failed - 06/06/2020  9:11 AM      Failed - TSH needs to be rechecked within 3 months after an abnormal result. Refill until TSH is due.      Passed - TSH in normal range and within 360 days    TSH  Date Value Ref Range Status  08/26/2019 3.62 0.40 - 4.50 mIU/L Final         Passed - Valid encounter within last  12 months    Recent Outpatient Visits          2 weeks ago Chronic left hip pain   [facility] [place], Devonne Doughty, DO   1 month ago Acute cystitis without hematuria   [place], DO   4 months ago Epistaxis   [facility] Olin Hauser, DO   6 months ago Essential hypertension   [place], DO   9 months ago Chronic left hip pain   [place], DO             Noted that Potassium refill sent on 6/24 did not show confirmation of pharmacy receipt.  Resent Rx refill for 90 day supply.  Last TSH normal 08/2019; refilled 90 day supply at this time.

## 2020-06-16 DIAGNOSIS — I272 Pulmonary hypertension, unspecified: Secondary | ICD-10-CM | POA: Diagnosis not present

## 2020-07-06 ENCOUNTER — Encounter: Payer: Self-pay | Admitting: Family Medicine

## 2020-07-06 ENCOUNTER — Other Ambulatory Visit: Payer: Self-pay

## 2020-07-06 ENCOUNTER — Ambulatory Visit (INDEPENDENT_AMBULATORY_CARE_PROVIDER_SITE_OTHER): Payer: PPO | Admitting: Family Medicine

## 2020-07-06 DIAGNOSIS — M545 Low back pain, unspecified: Secondary | ICD-10-CM

## 2020-07-06 DIAGNOSIS — G8929 Other chronic pain: Secondary | ICD-10-CM | POA: Diagnosis not present

## 2020-07-06 MED ORDER — BACLOFEN 10 MG PO TABS: 5.0000 mg | ORAL_TABLET | Freq: Three times a day (TID) | ORAL | 2 refills | Status: DC | PRN

## 2020-07-06 NOTE — Progress Notes (Signed)
Virtual Visit via Telephone The purpose of this virtual visit is to provide medical care while limiting exposure to the novel coronavirus (COVID19) for both patient and office staff.  Consent was obtained for phone visit:  Yes.   Answered questions that patient had about telehealth interaction:  Yes.   I discussed the limitations, risks, security and privacy concerns of performing an evaluation and management service by telephone. I also discussed with the patient that there may be a patient responsible charge related to this service. The patient expressed understanding and agreed to proceed.  Patient Location: Home Provider Location: [facility])  ---------------------------------------------------------------------- Chief Complaint  Patient presents with  . Back Pain    onset 4 days     S: Reviewed CMA documentation. I have called patient and gathered additional HPI as follows:  Acute on Chronic Mid/Upper Back Pain Known problem in past with prior back pain similar. History of osteoarthritis. Spinal stenosis Last visit 05/20/20 for chronic L hip pain, treated that time with improvement now.  Reports that symptoms started 4 days ago approx, onset with some abdominal pain, now has resolved and it has moved to her upper half of her back and midline.  She has question about Tylenol - takes 1 in AM and occasionally taking 1 later in day. She has some Baclofen left over from past, had forgotten about it, asking if she can take it.  - She is taking Tramadol 50mg x 2 in AM for 100mg  then 3 additional doses throughout day, doing well with some significant benefit now adhering better. - Taking Gabapentin 100mg  TID - She still takes Aspirin 81mg  daily due to history of eye hemorrhage in past  Denies any fall or injury or redness swelling, dysuria, hematuria  Denies any fevers, chills, sweats, body ache, cough, shortness of breath, headache, abdominal pain,  diarrhea  Past Medical History:  Diagnosis Date  . 1st degree AV block   . Arthritis   . Chronic diastolic CHF (congestive heart failure) ([place])    a. echo 2014: EF 55-60%, nl wall motion, GR1DD, mild MR, moder mitral prolapse, PASP 40 mm Hg; b. echo 09/2015: EF 60-65%, nl wall motion, GR1DD, mod MR, mild to mod TR, PASP 56 mm Hg  . COPD (chronic obstructive pulmonary disease) ([place])   . Degenerative disc disease, lumbar   . Gastro-esophageal reflux   . Hyperlipidemia   . Mitral prolapse   . Mitral regurgitation   . Osteoporosis    Social History   Tobacco Use  . Smoking status: Never Smoker  . Smokeless tobacco: Never Used  Vaping Use  . Vaping Use: Never used  Substance Use Topics  . Alcohol use: No    Alcohol/week: 0.0 standard drinks  . Drug use: No    Current Outpatient Medications:  .  acetaminophen (TYLENOL) 500 MG tablet, Take 500 mg by mouth every 6 (six) hours as needed., Disp: , Rfl:  .  albuterol (PROVENTIL) (2.5 MG/3ML) 0.083% nebulizer solution, Take 3 mLs (2.5 mg total) by nebulization every 6 (six) hours as needed for wheezing or shortness of breath., Disp: 75 mL, Rfl: 12 .  aspirin EC 81 MG tablet, Take 1 tablet (81 mg total) by mouth daily., Disp: 90 tablet, Rfl: 3 .  Calcium Gluconate 500 MG CAPS, Take 2 capsules by mouth daily. , Disp: , Rfl:  .  gabapentin (NEURONTIN) 100 MG capsule, Take 1 capsule (100 mg total) by mouth 3 (three) times daily., Disp: 270 capsule, Rfl:  2 .  levothyroxine (SYNTHROID) 25 MCG tablet, Take 1 tablet (25 mcg total) by mouth daily before breakfast., Disp: 90 tablet, Rfl: 0 .  metoprolol tartrate (LOPRESSOR) 25 MG tablet, Take 0.5 tablets (12.5 mg total) by mouth 2 (two) times daily., Disp: 90 tablet, Rfl: 1 .  mirabegron ER (MYRBETRIQ) 25 MG TB24 tablet, Take 1 tablet (25 mg total) by mouth daily., Disp: 30 tablet, Rfl: 11 .  Multiple Vitamin (MULTI-VITAMIN DAILY PO), Take 1 tablet by mouth daily. , Disp: , Rfl:  .  Oyster Shell  (OYSTER CALCIUM) 500 MG TABS, Take 500 mg of elemental calcium by mouth 2 (two) times daily., Disp: , Rfl:  .  polyethylene glycol powder (GLYCOLAX/MIRALAX) powder, Take 17-34 g by mouth daily as needed., Disp: 250 g, Rfl: 2 .  potassium chloride (KLOR-CON) 10 MEQ tablet, , Disp: , Rfl:  .  potassium chloride (KLOR-CON) 10 MEQ tablet, Take 1 tablet (10 mEq total) by mouth 3 (three) times daily., Disp: 270 tablet, Rfl: 0 .  potassium chloride (KLOR-CON) 10 MEQ tablet, Take 1 tablet (10 mEq total) by mouth 3 (three) times daily., Disp: 270 tablet, Rfl: 0 .  rosuvastatin (CRESTOR) 10 MG tablet, Take 1 tablet (10 mg total) by mouth at bedtime., Disp: 90 tablet, Rfl: 3 .  sildenafil (REVATIO) 20 MG tablet, Take 1 tablet (20 mg total) by mouth 3 (three) times daily. For pulmonary hypertension, Disp: 270 tablet, Rfl: 1 .  torsemide (DEMADEX) 20 MG tablet, Take 2 tablets (40 mg total) by mouth 2 (two) times daily., Disp: 120 tablet, Rfl: 3 .  traMADol (ULTRAM) 50 MG tablet, Take two tablets (100 mg total) in am.  Take 1 tablet (50 mg total) every 6 hours as needed for moderate pain up to 3 additional doses per day., Disp: 150 tablet, Rfl: 2 .  baclofen (LIORESAL) 10 MG tablet, Take 0.5-1 tablets (5-10 mg total) by mouth 3 (three) times daily as needed for muscle spasms., Disp: 30 each, Rfl: 2  Depression screen [facility] 2/9 03/15/2020 12/08/2019 09/02/2019  Decreased Interest 0 0 0  Down, Depressed, Hopeless 0 0 0  PHQ - 2 Score 0 0 0  Altered sleeping - - 0  Tired, decreased energy - - 0  Change in appetite - - 0  Feeling bad or failure about yourself  - - 0  Trouble concentrating - - 0  Moving slowly or fidgety/restless - - 0  Suicidal thoughts - - 0  PHQ-9 Score - - 0  Difficult doing work/chores - - -    GAD 7 : Generalized Anxiety Score 09/04/2016  Nervous, Anxious, on Edge 0  Control/stop worrying 0  Worry too much - different things 0  Trouble relaxing 0  Restless 0  Easily annoyed or irritable  0  Afraid - awful might happen 0  Total GAD 7 Score 0  Anxiety Difficulty Not difficult at all    -------------------------------------------------------------------------- O: No physical exam performed due to remote telephone encounter.  Lab results reviewed.  Recent Results (from the past 2160 hour(s))  Urine Culture     Status: None   Collection Time: 05/05/20 11:44 AM   Specimen: Urine  Result Value Ref Range   MICRO NUMBER: [id number]    SPECIMEN QUALITY: Adequate    Sample Source URINE, CLEAN CATCH    STATUS: FINAL    ISOLATE 1:      Growth of mixed flora was isolated, suggesting probable contamination. No further testing will be performed. If clinically  indicated, recollection using a method to minimize contamination, with prompt transfer to Urine Culture Transport Tube, is  recommended.   POCT urinalysis dipstick     Status: Abnormal   Collection Time: 05/05/20  2:55 PM  Result Value Ref Range   Color, UA amber    Clarity, UA cloudy    Glucose, UA Negative Negative   Bilirubin, UA neg    Ketones, UA neg    Spec Grav, UA 1.010 1.010 - 1.025   Blood, UA positive    pH, UA 5.0 5.0 - 8.0   Protein, UA Positive (A) Negative   Urobilinogen, UA negative (A) 0.2 or 1.0 E.U./dL   Nitrite, UA positive    Leukocytes, UA Large (3+) (A) Negative   Appearance     Odor      -------------------------------------------------------------------------- A&P:  Problem List Items Addressed This Visit    Chronic back pain - Primary (Chronic)   Relevant Medications   baclofen (LIORESAL) 10 MG tablet     Acute on chronic bilateral upper/mid back pain without sciatica Suspect likely due to muscle spasm/strain, without known injury or trauma.  In setting of known chronic back pain, OA/DJD, spinal stenosis - No red flag symptoms. Negative SLR for radiculopathy - Inadequate conservative therapy   Plan: 1. INCREASE Tylenol dose to extra str 500mg  x 2 = 1000mg  TID. She was under  dosing at 500mg  AM and occasionally 500mg  PM. 2. Re order muscle relaxant with Baclofen 10mg  tabs - take 5-10mg  up to TID PRN, titrate up as tolerated caution sedation. She has done well on this before, had old bottle needed new rx 4. CONTINUE current Tramadol 50mg  x 2 in AM then 3 more doses throughout day, doing well on this has refills. 5. CONTINUE Gabapentin 161m TID  Encouraged use of heating pad 1-2x daily for now then PRN Follow-up 4-6 weeks if not improved for re-evaluation, consider X-ray imaging, trial of PT, and possibly referral to Orthopedic   Meds ordered this encounter  Medications  . baclofen (LIORESAL) 10 MG tablet    Sig: Take 0.5-1 tablets (5-10 mg total) by mouth 3 (three) times daily as needed for muscle spasms.    Dispense:  30 each    Refill:  2    Follow-up: As needed 4 weeks if not improving Consider X-ray  Patient verbalizes understanding with the above medical recommendations including the limitation of remote medical advice.  Specific follow-up and call-back criteria were given for patient to follow-up or seek medical care more urgently if needed.   - Time spent in direct consultation with patient on phone: 10 minutes   Nobie Putnam, [facility] 07/06/2020, 10:59 AM

## 2020-07-12 ENCOUNTER — Ambulatory Visit
Admission: RE | Admit: 2020-07-12 | Discharge: 2020-07-12 | Disposition: A | Discharge status: Home / Self Care | Payer: PPO | Source: Ambulatory Visit | Attending: Family Medicine | Admitting: Family Medicine

## 2020-07-12 ENCOUNTER — Encounter: Payer: Self-pay | Admitting: Family Medicine

## 2020-07-12 ENCOUNTER — Ambulatory Visit (INDEPENDENT_AMBULATORY_CARE_PROVIDER_SITE_OTHER): Payer: PPO | Admitting: Family Medicine

## 2020-07-12 ENCOUNTER — Other Ambulatory Visit: Payer: Self-pay

## 2020-07-12 VITALS — BP 110/50 | HR 72 | Temp 97.7°F | Resp 16 | Ht <= 58 in | Wt 81.0 lb

## 2020-07-12 DIAGNOSIS — R1084 Generalized abdominal pain: Secondary | ICD-10-CM | POA: Insufficient documentation

## 2020-07-12 DIAGNOSIS — M545 Low back pain, unspecified: Secondary | ICD-10-CM

## 2020-07-12 DIAGNOSIS — G8929 Other chronic pain: Secondary | ICD-10-CM | POA: Diagnosis not present

## 2020-07-12 DIAGNOSIS — K5900 Constipation, unspecified: Secondary | ICD-10-CM | POA: Insufficient documentation

## 2020-07-12 DIAGNOSIS — M4184 Other forms of scoliosis, thoracic region: Secondary | ICD-10-CM | POA: Diagnosis not present

## 2020-07-12 DIAGNOSIS — J410 Simple chronic bronchitis: Secondary | ICD-10-CM

## 2020-07-12 DIAGNOSIS — E43 Unspecified severe protein-calorie malnutrition: Secondary | ICD-10-CM | POA: Diagnosis not present

## 2020-07-12 DIAGNOSIS — I5032 Chronic diastolic (congestive) heart failure: Secondary | ICD-10-CM

## 2020-07-12 DIAGNOSIS — M5136 Other intervertebral disc degeneration, lumbar region: Secondary | ICD-10-CM | POA: Diagnosis not present

## 2020-07-12 NOTE — Patient Instructions (Addendum)
Thank you for coming to the office today.  X-rays today Stay tuned for update  Stay tuned for phone call from [place]. To do home visit to help you with symptoms.  For Constipation (less frequent bowel movement that can be hard dry or involve straining).  Recommend trying OTC Miralax 17g = 1 capful in large glass water once daily for now, try several days to see if working, goal is soft stool or BM 1-2 times daily, if too loose then reduce dose or try every other day. If not effective may need to increase it to 2 doses at once in AM or may do 1 in morning and 1 in afternoon/evening  - This medicine is very safe and can be used often without any problem and will not make you dehydrated. It is good for use on AS NEEDED BASIS or even MAINTENANCE therapy for longer term for several days to weeks at a time to help regulate bowel movements  Other more natural remedies or preventative treatment: - Increase hydration with water - Increase fiber in diet (high fiber foods = vegetables, leafy greens, oats/grains) - May take OTC Fiber supplement (metamucil powder or pill/gummy) - May try OTC Probiotic  Please schedule a Follow-up Appointment to: Return if symptoms worsen or fail to improve.  If you have any other questions or concerns, please feel free to call the office or send a message through [place]. You may also schedule an earlier appointment if necessary.  Additionally, you may be receiving a survey about your experience at our office within a few days to 1 week by e-mail or mail. We value your feedback.  Nobie Putnam, DO [place]

## 2020-07-12 NOTE — Progress Notes (Signed)
Subjective:    Patient ID: Jennifer Skinner, female    DOB: 06/26/1931, 86 y.o.   MRN: [id number]  Jennifer Skinner is a 86 y.o. female presenting on 07/12/2020 for Abdominal Pain (onset 5 days) and Back Pain (chronic)   HPI   Epigastric Abdominal Pain Reports new onset abdominal upper mid abdominal pain worse after eating meal, seems to be worse with movement, especially if lifting self up from seated position Weight has been steady in past 3 months Denies heartburn, but has history of GERD Denies nausea vomiting Admits constipation  Follow-up Acute on Chronic Mid/Upper Back Pain Known problem in past with prior back pain similar. History of osteoarthritis. Spinal stenosis Last visit 05/20/20 for chronic L hip pain, treated that time with improvement now.  Reports that symptoms started recently with now flare of upper back pain again, had this before then her dose of Tramadol was increased seems to return now. Admits chronic back pain She is taking baclofen, and tylenol higher dose - She is taking Tramadol 50mg x 2 in AM for 100mg  then 3 additional doses throughout day, doing well with some significant benefit now adhering better. - Taking Gabapentin 100mg  TID - She still takes Aspirin 81mg  daily due to history of eye hemorrhage in past  Denies any fall or injury or redness swelling, dysuria, hematuria  Denies any fevers, chills, sweats, body ache, cough, shortness of breath, headache, abdominal pain, diarrhea    Depression screen [facility] 2/9 03/15/2020 12/08/2019 09/02/2019  Decreased Interest 0 0 0  Down, Depressed, Hopeless 0 0 0  PHQ - 2 Score 0 0 0  Altered sleeping - - 0  Tired, decreased energy - - 0  Change in appetite - - 0  Feeling bad or failure about yourself  - - 0  Trouble concentrating - - 0  Moving slowly or fidgety/restless - - 0  Suicidal thoughts - - 0  PHQ-9 Score - - 0  Difficult doing work/chores - - -    Social History   Tobacco Use  . Smoking  status: Never Smoker  . Smokeless tobacco: Never Used  Vaping Use  . Vaping Use: Never used  Substance Use Topics  . Alcohol use: No    Alcohol/week: 0.0 standard drinks  . Drug use: No    Review of Systems Per HPI unless specifically indicated above     Objective:    BP (!) 110/50   Pulse 72   Temp 97.7 F (36.5 C) (Temporal)   Resp 16   Ht 4\' 8"  (1.422 m)   Wt 81 lb (36.7 kg)   SpO2 98% Comment: on 3 liter of O2  BMI 18.16 kg/m   Wt Readings from Last 3 Encounters:  07/12/20 81 lb (36.7 kg)  05/05/20 81 lb 9.6 oz (37 kg)  09/02/19 81 lb 9.6 oz (37 kg)    Physical Exam Vitals and nursing note reviewed.  Constitutional:      General: She is not in acute distress.    Appearance: She is well-developed. She is not diaphoretic.     Comments: Chronically ill and thin appearing 86 yr female, mostly comfortable, cooperative  HENT:     Head: Normocephalic and atraumatic.  Eyes:     General:        Right eye: No discharge.        Left eye: No discharge.     Conjunctiva/sclera: Conjunctivae normal.  Neck:     Thyroid: No thyromegaly.  Cardiovascular:  Rate and Rhythm: Normal rate and regular rhythm.     Heart sounds: Normal heart sounds. No murmur heard.   Pulmonary:     Effort: Pulmonary effort is normal. No respiratory distress.     Breath sounds: Normal breath sounds. No wheezing or rales.     Comments: On supplemental o2 via [place] Abdominal:     General: Bowel sounds are decreased. There is no distension.     Palpations: Abdomen is soft. There is no mass.     Tenderness: There is abdominal tenderness in the suprapubic area and left lower quadrant.     Hernia: No hernia is present.  Musculoskeletal:        General: Normal range of motion.     Cervical back: Normal range of motion and neck supple.  Lymphadenopathy:     Cervical: No cervical adenopathy.  Skin:    General: Skin is warm and dry.     Findings: No erythema or rash.  Neurological:     Mental  Status: She is alert and oriented to person, place, and time.  Psychiatric:        Behavior: Behavior normal.     Comments: Well groomed, good eye contact, normal speech and thoughts     CLINICAL DATA:  Generalized abdominal pain with constipation for 2-3 days.  EXAM: ABDOMEN - 1 VIEW  COMPARISON:  Radiographs 08/19/2017  FINDINGS: Two supine views the abdomen demonstrate a moderate amount of stool throughout the colon. The bowel gas pattern is nonobstructive. There is no bowel wall thickening or supine evidence of free intraperitoneal air. There are several new curvilinear densities within the right mid which may reflect ingested pill fragments. Left pelvic calcifications are grossly stable. Compression deformities at T11 and L1 are grossly stable compared chest CTA 01/08/2018. There is a convex left thoracolumbar scoliosis.  IMPRESSION: 1. Moderate colonic stool burden without evidence of bowel obstruction. 2. Grossly stable compression deformities of T11 and L1.   Electronically Signed   By: Richardean Sale M.D.   On: 07/12/2020 12:22  ----------------  CLINICAL DATA:  Chronic mid back pain.  History of osteoarthritis.  EXAM: THORACIC SPINE - 3 VIEWS  COMPARISON:  Chest CTA 01/08/2018. Thoracic MRI 12/28/2013. Radiographs 01/07/2018 and 12/07/2013.  FINDINGS: There are 12 rib-bearing thoracic type vertebral bodies. There is a grossly stable convex left thoracolumbar kyphoscoliosis. Chronic compression deformities at T6, T11, T12 and L1 are grossly stable. There are possible new mild compression deformities at T8 and T9. There is no widening of the interpedicular distance. The bones are demineralized. Subsegmental atelectasis is present at both lung bases.  IMPRESSION: Possible new mild compression deformities at T8 and T9 compared with CT 01/08/2018, age indeterminate. Grossly stable chronic compression deformities at T6, T11, T12 and  L1.   Electronically Signed   By: Richardean Sale M.D.   On: 07/12/2020 12:29   Results for orders placed or performed in visit on 05/05/20  Urine Culture   Specimen: Urine  Result Value Ref Range   MICRO NUMBER: [id number]    SPECIMEN QUALITY: Adequate    Sample Source URINE, CLEAN CATCH    STATUS: FINAL    ISOLATE 1:      Growth of mixed flora was isolated, suggesting probable contamination. No further testing will be performed. If clinically indicated, recollection using a method to minimize contamination, with prompt transfer to Urine Culture Transport Tube, is  recommended.   POCT urinalysis dipstick  Result Value Ref Range   Color,  UA amber    Clarity, UA cloudy    Glucose, UA Negative Negative   Bilirubin, UA neg    Ketones, UA neg    Spec Grav, UA 1.010 1.010 - 1.025   Blood, UA positive    pH, UA 5.0 5.0 - 8.0   Protein, UA Positive (A) Negative   Urobilinogen, UA negative (A) 0.2 or 1.0 E.U./dL   Nitrite, UA positive    Leukocytes, UA Large (3+) (A) Negative   Appearance     Odor        Assessment & Plan:   Problem List Items Addressed This Visit    Chronic back pain - Primary (Chronic)   Relevant Orders   DG Thoracic Spine W/Swimmers    Other Visit Diagnoses    Constipation, unspecified constipation type       Relevant Orders   DG Abd 1 View   Abdominal pain, generalized       Relevant Orders   DG Abd 1 View      Lower abdominal pain with constipation Likely opioid induced on Tramadol History is suggestive, no other obvious GI red flags KUB today, confirms moderate stool burden, inadequately treated at home Start Miralax regimen, has medicine at home  Back pain, chronic, recurrent Known spinal deformity and prior compression fractures / OA/DJD multiple areas of T/L spine Has chronic functional back pain, already treated on Tramadol and muscle relaxant/Tylenol Had underdosed in past, now reviewed dosing seems to be more adequately treating it  but it seems persistent  Given frailty and multiple chronic conditions, with frequent symptomology with pain, constipation, dyspnea/o2 needs, will refer to [facility] for consultation and home visits for this  86 year old patient who may benefit from additional therapeutic symptom management advice.   Orders Placed This Encounter  Procedures  . DG Abd 1 View    Standing Status:   Future    Number of Occurrences:   1    Standing Expiration Date:   01/12/2021    Order Specific Question:   Reason for Exam (SYMPTOM  OR DIAGNOSIS REQUIRED)    Answer:   generalized midline abdominal pain, constipated, no BM in 2-3 days    Order Specific Question:   Preferred imaging location?    Answer:   [facility] Thoracic Spine W/Swimmers    Standing Status:   Future    Number of Occurrences:   1    Standing Expiration Date:   01/12/2021    Order Specific Question:   Reason for Exam (SYMPTOM  OR DIAGNOSIS REQUIRED)    Answer:   chronic mid back pain, osteoarthritis, rule out compression injury    Order Specific Question:   Preferred imaging location?    Answer:   [facility]    Order Specific Question:   Radiology Contrast Protocol - do NOT remove file path    Answer:   [email or web address]  . Amb Referral to Palliative Care    Referral Priority:   Routine    Referral Type:   Consultation    Number of Visits Requested:   1     No orders of the defined types were placed in this encounter.     Follow up plan: Return if symptoms worsen or fail to improve.   Nobie Putnam, DO [facility] 07/12/2020, 11:11 AM

## 2020-07-15 ENCOUNTER — Telehealth: Payer: Self-pay | Admitting: Primary Care

## 2020-07-15 NOTE — Telephone Encounter (Signed)
Called patient to offer to schedule the Palliative Consult, no answer - left message with reason for call along with my name and contact number, requesting a return call.

## 2020-07-17 DIAGNOSIS — I272 Pulmonary hypertension, unspecified: Secondary | ICD-10-CM | POA: Diagnosis not present

## 2020-07-18 ENCOUNTER — Telehealth: Payer: Self-pay

## 2020-07-18 NOTE — Telephone Encounter (Signed)
Patient notified

## 2020-07-18 NOTE — Telephone Encounter (Signed)
She should call [facility] to schedule new patient home visit. They called her 07/15/20 but the patient did not answer to schedule.  She can call [phone] to schedule home visit.  We already spoke to [place] about her back pain with the X-rays on her spine.  She has osteoarthritis and prior compression fractures in past.  She has constipation and stool build up, but if the miralax is bothering her she can stop for now.  Nobie Putnam, [facility] 07/18/2020, 12:11 PM

## 2020-07-18 NOTE — Telephone Encounter (Signed)
As per patient for her constipation she had taken Miralax 1 packet as suggested, but Saturday after supper she had stool incontinence which was really worst only once so stopped taking Miralax on weekend Sunday was not bad but today--Monday after breakfast had another episode of stool incontinence which is not worst like previous one, she also has back pain more on Left side and abdominal pain under her rib cage. She wants to know what is causing the pain ?

## 2020-07-18 NOTE — Telephone Encounter (Signed)
Copied from [place] [id number]. Topic: General - Other >> Jul 18, 2020 10:26 AM Yvette Rack wrote: Reason for CRM: Pt stated she needs to speak with her doctor regarding Miralax. Pt did not want to go in to detail about it. She stated the doctor is familiar and would know what she is talking about. Pt requests call back asap. Cb# [phone]

## 2020-07-21 ENCOUNTER — Other Ambulatory Visit: Payer: Self-pay | Admitting: Family Medicine

## 2020-07-21 DIAGNOSIS — M48062 Spinal stenosis, lumbar region with neurogenic claudication: Secondary | ICD-10-CM

## 2020-07-21 DIAGNOSIS — G8929 Other chronic pain: Secondary | ICD-10-CM

## 2020-07-21 NOTE — Telephone Encounter (Signed)
Requested medication (s) are due for refill today:  Yes  Requested medication (s) are on the active medication list:  Yes  Future visit scheduled:  No  Last Refill: 04/21/20; #150/ RF x 2  Notes to Clinic:  medication is not delegated.  Requested Prescriptions  Pending Prescriptions Disp Refills   traMADol (ULTRAM) 50 MG tablet [Pharmacy Med Name: TRAMADOL HCL 50 MG TABLET] 150 tablet 0    Sig: Take two tablets (100 mg total) in am.  Take 1 tablet (50 mg total) every 6 hours as needed for moderate pain up to 3 additional doses per day.      Not Delegated - Analgesics:  Opioid Agonists Failed - 07/21/2020 10:14 AM      Failed - This refill cannot be delegated      Failed - Urine Drug Screen completed in last 360 days.      Passed - Valid encounter within last 6 months    Recent Outpatient Visits           1 week ago Chronic midline low back pain without sciatica   [place], DO   2 weeks ago Chronic midline low back pain without sciatica   [place], DO   2 months ago Chronic left hip pain   [place], DO   2 months ago Acute cystitis without hematuria   [facility] Olin Hauser, DO   6 months ago Epistaxis   [place], Devonne [place]ought[place], [place]

## 2020-08-16 ENCOUNTER — Telehealth: Payer: Self-pay | Admitting: Primary Care

## 2020-08-16 NOTE — Telephone Encounter (Signed)
Spoke with patient regarding Palliative services and she has declined services at this time.  She didn't feel that we could help her with her pain issues, other than tell her to take a pain pill.  I asked patient if she wanted me to cancel the referral and she said yes.  I instructed her that I would cancel the referral and notify MD and I also told her that if she changed her mind in the future to please contact MD office and let them know and they could send [place] another referral and she was in agreement with this.

## 2020-08-17 DIAGNOSIS — I272 Pulmonary hypertension, unspecified: Secondary | ICD-10-CM | POA: Diagnosis not present

## 2020-08-18 ENCOUNTER — Other Ambulatory Visit: Payer: Self-pay | Admitting: Family Medicine

## 2020-08-18 DIAGNOSIS — M48062 Spinal stenosis, lumbar region with neurogenic claudication: Secondary | ICD-10-CM

## 2020-08-18 DIAGNOSIS — G8929 Other chronic pain: Secondary | ICD-10-CM

## 2020-08-18 NOTE — Telephone Encounter (Signed)
Requested medication (s) are due for refill today: yes  Requested medication (s) are on the active medication list: yes  Last refill:  07/21/20 #150  Future visit scheduled: no  Notes to clinic:  Please review for refill. Refill not delegated per protocol.    Requested Prescriptions  Pending Prescriptions Disp Refills   traMADol (ULTRAM) 50 MG tablet [Pharmacy Med Name: TRAMADOL HCL 50 MG TABLET] 150 tablet 0    Sig: Take two tablets (100 mg total) in am. Take 1 tablet (50 mg total) every 6 hours as needed for moderate pain up to 3 additional doses per day.      Not Delegated - Analgesics:  Opioid Agonists Failed - 08/18/2020  1:12 PM      Failed - This refill cannot be delegated      Failed - Urine Drug Screen completed in last 360 days.      Passed - Valid encounter within last 6 months    Recent Outpatient Visits           1 month ago Chronic midline low back pain without sciatica   [place], DO   1 month ago Chronic midline low back pain without sciatica   [place], DO   3 months ago Chronic left hip pain   [place], DO   3 months ago Acute cystitis without hematuria   [place], DO   7 months ago Epistaxis   [place], Devonne Doughty, [place]

## 2020-08-24 ENCOUNTER — Other Ambulatory Visit: Payer: Self-pay | Admitting: Family Medicine

## 2020-08-24 DIAGNOSIS — E7800 Pure hypercholesterolemia, unspecified: Secondary | ICD-10-CM

## 2020-08-24 NOTE — Telephone Encounter (Signed)
Requested medication (s) are due for refill today -yes  Requested medication (s) are on the active medication list -yes  Future visit scheduled -no  Last refill: 05/26/20  Notes to clinic: Fails all lab protocol- over 1 year- sent for review and possible scheduling  Requested Prescriptions  Pending Prescriptions Disp Refills   rosuvastatin (CRESTOR) 10 MG tablet [Pharmacy Med Name: ROSUVASTATIN CALCIUM 10 MG TAB] 90 tablet 0    Sig: Take 1 tablet (10 mg total) by mouth at bedtime.      Cardiovascular:  Antilipid - Statins Failed - 08/24/2020  9:09 AM      Failed - Total Cholesterol in normal range and within 360 days    Cholesterol  Date Value Ref Range Status  08/26/2019 129 <200 mg/dL Final          Failed - LDL in normal range and within 360 days    LDL Cholesterol (Calc)  Date Value Ref Range Status  08/26/2019 64 mg/dL (calc) Final    Comment:    Reference range: <100 . Desirable range <100 mg/dL for primary prevention;   <70 mg/dL for patients with CHD or diabetic patients  with > or = 2 CHD risk factors. Marland Kitchen LDL-C is now calculated using the Martin-Hopkins  calculation, which is a validated novel method providing  better accuracy than the Friedewald equation in the  estimation of LDL-C.  Cresenciano Genre et al. Annamaria Helling. 2637;858(85): 2061-2068  (http://education.[facility].com/faq/FAQ164)           Failed - HDL in normal range and within 360 days    HDL  Date Value Ref Range Status  08/26/2019 51 > OR = 50 mg/dL Final          Failed - Triglycerides in normal range and within 360 days    Triglycerides  Date Value Ref Range Status  08/26/2019 56 <150 mg/dL Final          Passed - Patient is not pregnant      Passed - Valid encounter within last 12 months    Recent Outpatient Visits           1 month ago Chronic midline low back pain without sciatica   [place], DO   1 month ago Chronic midline low back pain  without sciatica   [place], DO   3 months ago Chronic left hip pain   [place], DO   3 months ago Acute cystitis without hematuria   [place], DO   7 months ago Epistaxis   [place], DO                  Requested Prescriptions  Pending Prescriptions Disp Refills   rosuvastatin (CRESTOR) 10 MG tablet [Pharmacy Med Name: ROSUVASTATIN CALCIUM 10 MG TAB] 90 tablet 0    Sig: Take 1 tablet (10 mg total) by mouth at bedtime.      Cardiovascular:  Antilipid - Statins Failed - 08/24/2020  9:09 AM      Failed - Total Cholesterol in normal range and within 360 days    Cholesterol  Date Value Ref Range Status  08/26/2019 129 <200 mg/dL Final          Failed - LDL in normal range and within 360 days    LDL Cholesterol (Calc)  Date Value Ref Range Status  08/26/2019 64 mg/dL (calc) Final    Comment:    Reference range: <100 . Desirable range <100 mg/dL for primary prevention;   <70 mg/dL for patients with CHD or diabetic patients  with > or = 2 CHD risk factors. Marland Kitchen LDL-C is now calculated using the Martin-Hopkins  calculation, which is a validated novel method providing  better accuracy than the Friedewald equation in the  estimation of LDL-C.  Cresenciano Genre et al. Annamaria Helling. 8841;660(63): 2061-2068  (http://education.[facility].com/faq/FAQ164)           Failed - HDL in normal range and within 360 days    HDL  Date Value Ref Range Status  08/26/2019 51 > OR = 50 mg/dL Final          Failed - Triglycerides in normal range and within 360 days    Triglycerides  Date Value Ref Range Status  08/26/2019 56 <150 mg/dL Final          Passed - Patient is not pregnant      Passed - Valid encounter within last 12 months    Recent Outpatient Visits           1 month ago Chronic midline low back pain  without sciatica   [place], DO   1 month ago Chronic midline low back pain without sciatica   [place], DO   3 months ago Chronic left hip pain   [place], DO   3 months ago Acute cystitis without hematuria   [facility] [place], Devonne Doughty, DO   7 months ago Epistaxis   [place], Devonne Doughty, [place]

## 2020-09-02 ENCOUNTER — Other Ambulatory Visit: Payer: Self-pay | Admitting: Family Medicine

## 2020-09-02 DIAGNOSIS — E034 Atrophy of thyroid (acquired): Secondary | ICD-10-CM

## 2020-09-05 ENCOUNTER — Other Ambulatory Visit: Payer: Self-pay | Admitting: Family Medicine

## 2020-09-05 DIAGNOSIS — I10 Essential (primary) hypertension: Secondary | ICD-10-CM

## 2020-09-09 ENCOUNTER — Telehealth: Payer: Self-pay | Admitting: Family Medicine

## 2020-09-09 NOTE — Telephone Encounter (Signed)
Received refill request on 09/09/2020 but Rx already send on 09/21

## 2020-09-12 ENCOUNTER — Other Ambulatory Visit: Payer: Self-pay | Admitting: Family Medicine

## 2020-09-12 DIAGNOSIS — I890 Lymphedema, not elsewhere classified: Secondary | ICD-10-CM

## 2020-09-12 DIAGNOSIS — I5032 Chronic diastolic (congestive) heart failure: Secondary | ICD-10-CM

## 2020-09-14 ENCOUNTER — Other Ambulatory Visit: Payer: Self-pay | Admitting: Family Medicine

## 2020-09-14 DIAGNOSIS — I5032 Chronic diastolic (congestive) heart failure: Secondary | ICD-10-CM

## 2020-09-14 DIAGNOSIS — I890 Lymphedema, not elsewhere classified: Secondary | ICD-10-CM

## 2020-09-14 MED ORDER — TORSEMIDE 20 MG PO TABS: 40.0000 mg | ORAL_TABLET | Freq: Two times a day (BID) | ORAL | 1 refills | Status: DC

## 2020-09-14 NOTE — Telephone Encounter (Signed)
PT need a refill  torsemide (DEMADEX) 20 MG tablet [[id number]]  [place], [place] - [place] [address]  Phone: [phone] Fax: [phone]

## 2020-09-14 NOTE — Addendum Note (Signed)
Addended by: Olin Hauser on: 09/14/2020 01:00 PM   Modules accepted: Orders

## 2020-09-14 NOTE — Addendum Note (Signed)
Addended by: Elliot Cousin on: 09/14/2020 10:48 AM   Modules accepted: Orders

## 2020-09-16 DIAGNOSIS — I272 Pulmonary hypertension, unspecified: Secondary | ICD-10-CM | POA: Diagnosis not present

## 2020-09-19 ENCOUNTER — Ambulatory Visit: Payer: Self-pay

## 2020-09-19 ENCOUNTER — Other Ambulatory Visit: Payer: Self-pay | Admitting: Family Medicine

## 2020-09-19 DIAGNOSIS — G8929 Other chronic pain: Secondary | ICD-10-CM

## 2020-09-19 DIAGNOSIS — M48062 Spinal stenosis, lumbar region with neurogenic claudication: Secondary | ICD-10-CM

## 2020-09-19 DIAGNOSIS — M545 Low back pain, unspecified: Secondary | ICD-10-CM

## 2020-09-19 NOTE — Telephone Encounter (Signed)
Requested medication (s) are due for refill today- yes  Requested medication (s) are on the active medication list -yes  Future visit scheduled -no  Last refill: 08/18/20  Notes to clinic: Request non delegated Rx  Requested Prescriptions  Pending Prescriptions Disp Refills   traMADol (ULTRAM) 50 MG tablet [Pharmacy Med Name: TRAMADOL HCL 50 MG TABLET] 150 tablet 0    Sig: Take two tablets (100 mg total) in am. Take 1 tablet (50 mg total) every 6 hours as needed for moderate pain up to 3 additional doses per day.      Not Delegated - Analgesics:  Opioid Agonists Failed - 09/19/2020  9:31 AM      Failed - This refill cannot be delegated      Failed - Urine Drug Screen completed in last 360 days.      Passed - Valid encounter within last 6 months    Recent Outpatient Visits           2 months ago Chronic midline low back pain without sciatica   [place], DO   2 months ago Chronic midline low back pain without sciatica   [place], DO   4 months ago Chronic left hip pain   [facility] [place], Devonne Doughty, DO   4 months ago Acute cystitis without hematuria   [facility] [place], Devonne Doughty, DO   8 months ago Epistaxis   [place], DO                  Requested Prescriptions  Pending Prescriptions Disp Refills   traMADol (ULTRAM) 50 MG tablet [Pharmacy Med Name: TRAMADOL HCL 50 MG TABLET] 150 tablet 0    Sig: Take two tablets (100 mg total) in am. Take 1 tablet (50 mg total) every 6 hours as needed for moderate pain up to 3 additional doses per day.      Not Delegated - Analgesics:  Opioid Agonists Failed - 09/19/2020  9:31 AM      Failed - This refill cannot be delegated      Failed - Urine Drug Screen completed in last 360 days.      Passed - Valid encounter within last 6 months    Recent  Outpatient Visits           2 months ago Chronic midline low back pain without sciatica   [place], DO   2 months ago Chronic midline low back pain without sciatica   [place], DO   4 months ago Chronic left hip pain   [place], DO   4 months ago Acute cystitis without hematuria   [facility] Olin Hauser, DO   8 months ago Epistaxis   [place], Devonne Doughty, [place]

## 2020-09-19 NOTE — Telephone Encounter (Signed)
Personally reviewed [place] today, 09/19/20.  According to PMP aware, pt last received a refill on 08/18/2020.  Refill of her controlled substance will be provided today.

## 2020-09-19 NOTE — Telephone Encounter (Signed)
Pt. Reports she started "feeling trembling all over yesterday." Has hand tremors "trying to drink my coffee." No other symptoms. Request virtual visit today. No availability with her PCP. No answer on Flow coordinator  Line. Please advise pt.  Reason for Disposition  Shuddering (trembling) occurs without an obvious cause  Answer Assessment - Initial Assessment Questions 1. APPEARANCE of MOVEMENT: "What did the jerking or twitching look like?" (e.g., body area)     Trembling 2. ONSET: "When did this start happening?" (e.g., hours, days, weeks, months ago)     Yesterday 3. DURATION: "How long does the jerk, twitch, or spasm last?"     Constant 4. FREQUENCY:  "How often does this happen?"      New 5. WHEN: "When does this happen?" (e.g., while awake, while falling asleep, while sleeping)     Constant 6. CAUSE: "What do you think caused the tremors?"     Unsure 7. OTHER SYMPTOMS: "Are there any other symptoms?" (e.g., fever, headache)     No 8. PREGNANCY: "Is there any chance you are pregnant?" "When was your last menstrual period?"     No  Protocols used: MUSCLE [facility]ERKS - TICS - [facility]

## 2020-09-20 ENCOUNTER — Encounter: Payer: Self-pay | Admitting: Family Medicine

## 2020-09-20 ENCOUNTER — Other Ambulatory Visit: Payer: Self-pay

## 2020-09-20 ENCOUNTER — Ambulatory Visit (INDEPENDENT_AMBULATORY_CARE_PROVIDER_SITE_OTHER): Payer: PPO | Admitting: Family Medicine

## 2020-09-20 VITALS — BP 111/51 | HR 86 | Temp 97.7°F | Resp 16 | Ht <= 58 in | Wt 78.0 lb

## 2020-09-20 DIAGNOSIS — R251 Tremor, unspecified: Secondary | ICD-10-CM | POA: Diagnosis not present

## 2020-09-20 NOTE — Progress Notes (Signed)
Subjective:    Patient ID: [name], female    DOB: June 20, 1931, 86 y.o.   MRN: [id number]  Jennifer Skinner is a 86 y.o. female presenting on 09/20/2020 for Numbness (onset 3 days) and Tremors (as per patient same feeling but internal onset 3 days)  Patient presents for a same day appointment. Here with husband Rosaria Ferries  HPI   Trembling / Tremors, internal She reports symptoms onset for past 3 days, no changes or cause that she is aware. New symptom. She has chronic pain from arthritis she has been out of Tramadol for few days while I was out of office and was waiting on rx sent in, it was sent yesterday afternoon on 09/19/20 by Cyndia Skeeters, FNP, she has not picked it up yet. She has not had fall. No chest pain or headache, or weakness. She does not have shaking or visible tremors if picks up something or does activity. She is taking Gabapentin currently  Health Maintenance: Due Flu vaccine, not ready to get it today  Depression screen [facility] 2/9 03/15/2020 12/08/2019 09/02/2019  Decreased Interest 0 0 0  Down, Depressed, Hopeless 0 0 0  PHQ - 2 Score 0 0 0  Altered sleeping - - 0  Tired, decreased energy - - 0  Change in appetite - - 0  Feeling bad or failure about yourself  - - 0  Trouble concentrating - - 0  Moving slowly or fidgety/restless - - 0  Suicidal thoughts - - 0  PHQ-9 Score - - 0  Difficult doing work/chores - - -    Social History   Tobacco Use  . Smoking status: Never Smoker  . Smokeless tobacco: Never Used  Vaping Use  . Vaping Use: Never used  Substance Use Topics  . Alcohol use: No    Alcohol/week: 0.0 standard drinks  . Drug use: No    Review of Systems Per HPI unless specifically indicated above     Objective:    BP (!) 111/51   Pulse 86   Temp 97.7 F (36.5 C) (Temporal)   Resp 16   Ht 4\' 8"  (1.422 m)   Wt 78 lb (35.4 kg)   SpO2 100% Comment: on three liter of Oxygen  BMI 17.49 kg/m   Wt Readings from Last 3 Encounters:  09/20/20  78 lb (35.4 kg)  07/12/20 81 lb (36.7 kg)  05/05/20 81 lb 9.6 oz (37 kg)    Physical Exam Vitals and nursing note reviewed.  Constitutional:      General: She is not in acute distress.    Appearance: She is well-developed. She is not diaphoretic.     Comments: Chronically ill and thin appearing 86 yr female, mostly comfortable, cooperative  HENT:     Head: Normocephalic and atraumatic.  Eyes:     General:        Right eye: No discharge.        Left eye: No discharge.     Conjunctiva/sclera: Conjunctivae normal.  Neck:     Thyroid: No thyromegaly.  Cardiovascular:     Rate and Rhythm: Normal rate and regular rhythm.     Heart sounds: Normal heart sounds. No murmur heard.   Pulmonary:     Effort: Pulmonary effort is normal. No respiratory distress.     Breath sounds: Normal breath sounds. No wheezing or rales.     Comments: On supplemental o2 via [place] Abdominal:     General: Bowel sounds are decreased. There  is no distension.     Palpations: Abdomen is soft. There is no mass.     Tenderness: There is abdominal tenderness in the suprapubic area and left lower quadrant.     Hernia: No hernia is present.  Musculoskeletal:        General: Normal range of motion.     Cervical back: Normal range of motion and neck supple.  Lymphadenopathy:     Cervical: No cervical adenopathy.  Skin:    General: Skin is warm and dry.     Findings: No erythema or rash.  Neurological:     General: No focal deficit present.     Mental Status: She is alert and oriented to person, place, and time.     Sensory: No sensory deficit.     Comments: No focal tremors  Psychiatric:        Behavior: Behavior normal.     Comments: Well groomed, good eye contact, normal speech and thoughts        Results for orders placed or performed in visit on 05/05/20  Urine Culture   Specimen: Urine  Result Value Ref Range   MICRO NUMBER: [id number]    SPECIMEN QUALITY: Adequate    Sample Source URINE, CLEAN CATCH     STATUS: FINAL    ISOLATE 1:      Growth of mixed flora was isolated, suggesting probable contamination. No further testing will be performed. If clinically indicated, recollection using a method to minimize contamination, with prompt transfer to Urine Culture Transport Tube, is  recommended.   POCT urinalysis dipstick  Result Value Ref Range   Color, UA amber    Clarity, UA cloudy    Glucose, UA Negative Negative   Bilirubin, UA neg    Ketones, UA neg    Spec Grav, UA 1.010 1.010 - 1.025   Blood, UA positive    pH, UA 5.0 5.0 - 8.0   Protein, UA Positive (A) Negative   Urobilinogen, UA negative (A) 0.2 or 1.0 E.U./dL   Nitrite, UA positive    Leukocytes, UA Large (3+) (A) Negative   Appearance     Odor        Assessment & Plan:   Problem List Items Addressed This Visit    None    Visit Diagnoses    Tremors of nervous system    -  Primary   Relevant Orders   CBC with Differential/Platelet   COMPLETE METABOLIC PANEL WITH GFR      Uncertain etiology History is not consistent Last labs 08/2019 On polpyharmacy could be med side effect, also as discussed today she is out of Tramadol for several days, normally takes daily, possibly a withdrawal side effect, it was sent in to pharmacy yesterday while I was out of office, on 09/19/20, she has not picked it up yet. Advised her to pick it up today and resume for her chronic pain Check CMET + CBC today, will follow up lab results, for any electrolyte abnormality  She should follow-up sooner if significant change or worsening, may need hospital evaluation if severe change weakness or other symptoms.  Also she declined last palliative referral, advised her that it is not for home care but for intermittent medical evaluation for symptom management at home, once every 4+ weeks approximately, I still think she needs this but she declines at this time.  No orders of the defined types were placed in this encounter.     Follow up  plan: Return  in about 4 weeks (around 10/18/2020), or if symptoms worsen or fail to improve, for follow-up.   Nobie Putnam, DO [facility] 09/20/2020, 11:45 AM

## 2020-09-20 NOTE — Patient Instructions (Addendum)
Thank you for coming to the office today.  Labs today, stay tuned for results.  Tramadol was ordered yesterday at 230pm - check with pharmacy  Please schedule a Follow-up Appointment to: Return in about 4 weeks (around 10/18/2020), or if symptoms worsen or fail to improve, for follow-up.  If you have any other questions or concerns, please feel free to call the office or send a message through [place]. You may also schedule an earlier appointment if necessary.  Additionally, you may be receiving a survey about your experience at our office within a few days to 1 week by e-mail or mail. We value your feedback.  Nobie Putnam, DO [place]

## 2020-09-21 LAB — COMPLETE METABOLIC PANEL WITH GFR
AG Ratio: 1.2 (calc) (ref 1.0–2.5)
ALT: 82 U/L — ABNORMAL HIGH (ref 6–29)
AST: 79 U/L — ABNORMAL HIGH (ref 10–35)
Albumin: 3.9 g/dL (ref 3.6–5.1)
Alkaline phosphatase (APISO): 74 U/L (ref 37–153)
BUN: 21 mg/dL (ref 7–25)
CO2: 29 mmol/L (ref 20–32)
Calcium: 9.5 mg/dL (ref 8.6–10.4)
Chloride: 101 mmol/L (ref 98–110)
Creat: 0.67 mg/dL (ref 0.60–0.88)
GFR, Est African American: 92 mL/min/{1.73_m2} (ref >=60)
GFR, Est Non African American: 79 mL/min/{1.73_m2} (ref >=60)
Globulin: 3.2 g/dL (calc) (ref 1.9–3.7)
Glucose, Bld: 77 mg/dL (ref 65–99)
Potassium: 4.6 mmol/L (ref 3.5–5.3)
Sodium: 141 mmol/L (ref 135–146)
Total Bilirubin: 0.4 mg/dL (ref 0.2–1.2)
Total Protein: 7.1 g/dL (ref 6.1–8.1)

## 2020-09-21 LAB — CBC WITH DIFFERENTIAL/PLATELET
Absolute Monocytes: 524 cells/uL (ref 200–950)
Basophils Absolute: 40 cells/uL (ref 0–200)
Basophils Relative: 0.9 %
Eosinophils Absolute: 22 cells/uL (ref 15–500)
Eosinophils Relative: 0.5 %
HCT: 40.1 % (ref 35.0–45.0)
Hemoglobin: 13.4 g/dL (ref 11.7–15.5)
Lymphs Abs: 713 cells/uL — ABNORMAL LOW (ref 850–3900)
MCH: 33.7 pg — ABNORMAL HIGH (ref 27.0–33.0)
MCHC: 33.4 g/dL (ref 32.0–36.0)
MCV: 100.8 fL — ABNORMAL HIGH (ref 80.0–100.0)
MPV: 10 fL (ref 7.5–12.5)
Monocytes Relative: 11.9 %
Neutro Abs: 3102 cells/uL (ref 1500–7800)
Neutrophils Relative %: 70.5 %
Platelets: 257 10*3/uL (ref 140–400)
RBC: 3.98 10*6/uL (ref 3.80–5.10)
RDW: 11.9 % (ref 11.0–15.0)
Total Lymphocyte: 16.2 %
WBC: 4.4 10*3/uL (ref 3.8–10.8)

## 2020-10-06 ENCOUNTER — Other Ambulatory Visit: Payer: Self-pay | Admitting: Family Medicine

## 2020-10-06 DIAGNOSIS — M5416 Radiculopathy, lumbar region: Secondary | ICD-10-CM

## 2020-10-06 DIAGNOSIS — M5136 Other intervertebral disc degeneration, lumbar region: Secondary | ICD-10-CM

## 2020-10-06 DIAGNOSIS — M47817 Spondylosis without myelopathy or radiculopathy, lumbosacral region: Secondary | ICD-10-CM

## 2020-10-06 MED ORDER — GABAPENTIN 100 MG PO CAPS: 100.0000 mg | ORAL_CAPSULE | Freq: Three times a day (TID) | ORAL | 1 refills | Status: DC

## 2020-10-06 NOTE — Telephone Encounter (Signed)
PT need a refill / she out medications  gabapentin (NEURONTIN) 100 MG capsule [[id number]]  [place],  - [place] [address]  Phone: [phone] Fax: [phone]

## 2020-10-17 DIAGNOSIS — I272 Pulmonary hypertension, unspecified: Secondary | ICD-10-CM | POA: Diagnosis not present

## 2020-10-18 ENCOUNTER — Other Ambulatory Visit: Payer: Self-pay | Admitting: Family Medicine

## 2020-10-18 DIAGNOSIS — G8929 Other chronic pain: Secondary | ICD-10-CM

## 2020-10-18 DIAGNOSIS — M545 Low back pain, unspecified: Secondary | ICD-10-CM

## 2020-10-18 DIAGNOSIS — M48062 Spinal stenosis, lumbar region with neurogenic claudication: Secondary | ICD-10-CM

## 2020-10-18 NOTE — Telephone Encounter (Signed)
Requested medication (s) are due for refill today: yes  Requested medication (s) are on the active medication list: yes  Last refill:  09/19/20  Future visit scheduled: no  Notes to clinic: not delegated   Requested Prescriptions  Pending Prescriptions Disp Refills   traMADol (ULTRAM) 50 MG tablet [Pharmacy Med Name: TRAMADOL HCL 50 MG TABLET] 150 tablet 0    Sig: Take two tablets (100 mg total) in am. Take 1 tablet (50 mg total) every 6 hours as needed for moderate pain up to 3 additional doses per day.      Not Delegated - Analgesics:  Opioid Agonists Failed - 10/18/2020  8:57 AM      Failed - This refill cannot be delegated      Failed - Urine Drug Screen completed in last 360 days      Passed - Valid encounter within last 6 months    Recent Outpatient Visits           4 weeks ago Tremors of nervous system   [place], DO   3 months ago Chronic midline low back pain without sciatica   [place], DO   3 months ago Chronic midline low back pain without sciatica   [place], DO   5 months ago Chronic left hip pain   [place], DO   5 months ago Acute cystitis without hematuria   [place], Devonne Doughty, DO

## 2020-11-02 ENCOUNTER — Other Ambulatory Visit: Payer: Self-pay | Admitting: Family Medicine

## 2020-11-02 DIAGNOSIS — I890 Lymphedema, not elsewhere classified: Secondary | ICD-10-CM

## 2020-11-02 DIAGNOSIS — I5032 Chronic diastolic (congestive) heart failure: Secondary | ICD-10-CM

## 2020-11-15 ENCOUNTER — Other Ambulatory Visit: Payer: Self-pay | Admitting: Family Medicine

## 2020-11-15 DIAGNOSIS — E7800 Pure hypercholesterolemia, unspecified: Secondary | ICD-10-CM

## 2020-11-15 NOTE — Telephone Encounter (Signed)
Future appt 12/27/2020 per patient request. Need labs

## 2020-11-28 ENCOUNTER — Other Ambulatory Visit: Payer: Self-pay | Admitting: Family Medicine

## 2020-11-28 DIAGNOSIS — I10 Essential (primary) hypertension: Secondary | ICD-10-CM

## 2020-12-05 ENCOUNTER — Other Ambulatory Visit: Payer: Self-pay | Admitting: Family Medicine

## 2020-12-05 DIAGNOSIS — E034 Atrophy of thyroid (acquired): Secondary | ICD-10-CM

## 2020-12-05 DIAGNOSIS — I5032 Chronic diastolic (congestive) heart failure: Secondary | ICD-10-CM

## 2020-12-05 DIAGNOSIS — I890 Lymphedema, not elsewhere classified: Secondary | ICD-10-CM

## 2020-12-05 NOTE — Telephone Encounter (Signed)
Requested Prescriptions  Pending Prescriptions Disp Refills   torsemide (DEMADEX) 20 MG tablet [Pharmacy Med Name: TORSEMIDE 20 MG TABLET] 120 tablet 0    Sig: Take 2 tablets (40 mg total) by mouth 2 (two) times daily.     Cardiovascular:  Diuretics - Loop Passed - 12/05/2020  8:53 AM      Passed - K in normal range and within 360 days    Potassium  Date Value Ref Range Status  09/20/2020 4.6 3.5 - 5.3 mmol/L Final  09/12/2013 4.0 3.5 - 5.1 mmol/L Final         Passed - Ca in normal range and within 360 days    Calcium  Date Value Ref Range Status  09/20/2020 9.5 8.6 - 10.4 mg/dL Final   Calcium, Total  Date Value Ref Range Status  09/12/2013 9.0 8.5 - 10.1 mg/dL Final         Passed - Na in normal range and within 360 days    Sodium  Date Value Ref Range Status  09/20/2020 141 135 - 146 mmol/L Final  08/18/2018 141 134 - 144 mmol/L Final  09/12/2013 138 136 - 145 mmol/L Final         Passed - Cr in normal range and within 360 days    Creat  Date Value Ref Range Status  09/20/2020 0.67 0.60 - 0.88 mg/dL Final    Comment:    For patients >31 years of age, the reference limit for Creatinine is approximately 13% higher for people identified as African-American. .          Passed - Last BP in normal range    BP Readings from Last 1 Encounters:  09/20/20 (!) 111/51         Passed - Valid encounter within last 6 months    Recent Outpatient Visits          2 months ago Tremors of nervous system   [place], DO   4 months ago Chronic midline low back pain without sciatica   [place], DO   5 months ago Chronic midline low back pain without sciatica   [place], DO   6 months ago Chronic left hip pain   [place], DO   7 months ago Acute cystitis without hematuria   [place], DO      Future Appointments            In 3 weeks [place], [facility], [facility]            levothyroxine (SYNTHROID) 25 MCG tablet [Pharmacy Med Name: LEVOTHYROXINE 25 MCG TABLET] 90 tablet     Sig: Take 1 tablet (25 mcg total) by mouth daily before breakfast.     Endocrinology:  Hypothyroid Agents Failed - 12/05/2020  8:53 AM      Failed - TSH needs to be rechecked within 3 months after an abnormal result. Refill until TSH is due.      Failed - TSH in normal range and within 360 days    TSH  Date Value Ref Range Status  08/26/2019 3.62 0.40 - 4.50 mIU/L Final         Passed - Valid encounter within last 12 months    Recent Outpatient Visits          2  months ago Tremors of nervous system   [facility] [place], Netta Neat, DO   4 months ago Chronic midline low back pain without sciatica   [facility] [place], Netta Neat, DO   5 months ago Chronic midline low back pain without sciatica   [facility] Smitty Cords, DO   6 months ago Chronic left hip pain   [facility] Smitty Cords, DO   7 months ago Acute cystitis without hematuria   [facility] [place] Charon, Netta Neat, DO      Future Appointments            In 3 weeks [place] Charon, Netta Neat, DO [facility], [facility]

## 2020-12-05 NOTE — Telephone Encounter (Signed)
Requested medication (s) are due for refill today: yes  Requested medication (s) are on the active medication list: yes  Last refill:  09/02/20  Future visit scheduled: yes  Notes to clinic:  overdue lab work   Requested Prescriptions  Pending Prescriptions Disp Refills   levothyroxine (SYNTHROID) 25 MCG tablet [Pharmacy Med Name: LEVOTHYROXINE 25 MCG TABLET] 90 tablet     Sig: Take 1 tablet (25 mcg total) by mouth daily before breakfast.      Endocrinology:  Hypothyroid Agents Failed - 12/05/2020  8:53 AM      Failed - TSH needs to be rechecked within 3 months after an abnormal result. Refill until TSH is due.      Failed - TSH in normal range and within 360 days    TSH  Date Value Ref Range Status  08/26/2019 3.62 0.40 - 4.50 mIU/L Final          Passed - Valid encounter within last 12 months    Recent Outpatient Visits           2 months ago Tremors of nervous system   [facility] [place], Netta Neat, DO   4 months ago Chronic midline low back pain without sciatica   [facility] [place], Netta Neat, DO   5 months ago Chronic midline low back pain without sciatica   [facility] Smitty Cords, DO   6 months ago Chronic left hip pain   [facility] Smitty Cords, DO   7 months ago Acute cystitis without hematuria   [facility] [place], Netta Neat, DO       Future Appointments             In 3 weeks Althea Charon, Netta Neat, DO [facility], [facility]              Signed Prescriptions Disp Refills   torsemide (DEMADEX) 20 MG tablet 120 tablet 0    Sig: Take 2 tablets (40 mg total) by mouth 2 (two) times daily.      Cardiovascular:  Diuretics - Loop Passed - 12/05/2020  8:53 AM      Passed - K in normal range and within 360 days    Potassium  Date Value Ref Range Status  09/20/2020 4.6 3.5 - 5.3 mmol/L Final  09/12/2013 4.0 3.5 -  5.1 mmol/L Final          Passed - Ca in normal range and within 360 days    Calcium  Date Value Ref Range Status  09/20/2020 9.5 8.6 - 10.4 mg/dL Final   Calcium, Total  Date Value Ref Range Status  09/12/2013 9.0 8.5 - 10.1 mg/dL Final          Passed - Na in normal range and within 360 days    Sodium  Date Value Ref Range Status  09/20/2020 141 135 - 146 mmol/L Final  08/18/2018 141 134 - 144 mmol/L Final  09/12/2013 138 136 - 145 mmol/L Final          Passed - Cr in normal range and within 360 days    Creat  Date Value Ref Range Status  09/20/2020 0.67 0.60 - 0.88 mg/dL Final    Comment:    For patients >39 years of age, the reference limit for Creatinine is approximately 13% higher for people identified as African-American. Verna Czech -  Last BP in normal range    BP Readings from Last 1 Encounters:  09/20/20 (!) 111/51          Passed - Valid encounter within last 6 months    Recent Outpatient Visits           2 months ago Tremors of nervous system   [place], DO   4 months ago Chronic midline low back pain without sciatica   [place], DO   5 months ago Chronic midline low back pain without sciatica   [place], DO   6 months ago Chronic left hip pain   [place], DO   7 months ago Acute cystitis without hematuria   [facility] [place], Devonne Doughty, DO       Future Appointments             In 3 weeks [place], Devonne Doughty, DO [facility], [facility]

## 2020-12-07 ENCOUNTER — Other Ambulatory Visit: Payer: Self-pay | Admitting: Family Medicine

## 2020-12-07 DIAGNOSIS — I10 Essential (primary) hypertension: Secondary | ICD-10-CM

## 2020-12-17 DIAGNOSIS — I272 Pulmonary hypertension, unspecified: Secondary | ICD-10-CM | POA: Diagnosis not present

## 2020-12-20 ENCOUNTER — Other Ambulatory Visit: Payer: Self-pay | Admitting: Family Medicine

## 2020-12-20 DIAGNOSIS — G8929 Other chronic pain: Secondary | ICD-10-CM

## 2020-12-20 DIAGNOSIS — M48062 Spinal stenosis, lumbar region with neurogenic claudication: Secondary | ICD-10-CM

## 2020-12-20 NOTE — Telephone Encounter (Signed)
Requested medication (s) are due for refill today: yes  Requested medication (s) are on the active medication list: yes  Last refill: 10/18/2020  Future visit scheduled: yes  Notes to clinic:  this refill cannot be delegated    Requested Prescriptions  Pending Prescriptions Disp Refills   traMADol (ULTRAM) 50 MG tablet 150 tablet 2    Sig: Take two tablets (100 mg total) in am.  Take 1 tablet (50 mg total) every 6 hours as needed for moderate pain up to 3 additional doses per day.      There is no refill protocol information for this order

## 2020-12-20 NOTE — Telephone Encounter (Signed)
Medication Refill - Medication: traMADol (ULTRAM) 50 MG tablet (patient completley out)    Has the patient contacted their pharmacy? Yes.    (Agent: If yes, when and what did the pharmacy advise?) Pharmacy advised several request has been sent and to contact PCP office directly  Preferred Pharmacy (with phone number or street name):  [facility], [place] Phone:  [phone]  Fax:  [phone]       Agent: Please be advised that RX refills may take up to 3 business days. We ask that you follow-up with your pharmacy.

## 2020-12-22 MED ORDER — TRAMADOL HCL 50 MG PO TABS: ORAL_TABLET | ORAL | 2 refills | Status: DC

## 2020-12-27 ENCOUNTER — Ambulatory Visit (INDEPENDENT_AMBULATORY_CARE_PROVIDER_SITE_OTHER): Payer: PPO | Admitting: Family Medicine

## 2020-12-27 ENCOUNTER — Other Ambulatory Visit: Payer: Self-pay

## 2020-12-27 DIAGNOSIS — Z5329 Procedure and treatment not carried out because of patient's decision for other reasons: Secondary | ICD-10-CM

## 2020-12-28 NOTE — Progress Notes (Signed)
No Show appointment.  Jennifer Skinner, [facility] 12/28/2020, 10:24 AM

## 2021-01-02 ENCOUNTER — Other Ambulatory Visit: Payer: Self-pay | Admitting: Family Medicine

## 2021-01-02 DIAGNOSIS — I272 Pulmonary hypertension, unspecified: Secondary | ICD-10-CM

## 2021-01-17 ENCOUNTER — Other Ambulatory Visit: Payer: Self-pay | Admitting: Family Medicine

## 2021-01-17 DIAGNOSIS — I10 Essential (primary) hypertension: Secondary | ICD-10-CM

## 2021-01-20 ENCOUNTER — Telehealth: Payer: Self-pay | Admitting: Cardiovascular Disease

## 2021-01-20 NOTE — Telephone Encounter (Signed)
3 attempts to schedule fu appt from recall list.   Deleting recall.   

## 2021-01-24 ENCOUNTER — Other Ambulatory Visit: Payer: Self-pay | Admitting: Family Medicine

## 2021-01-24 DIAGNOSIS — G8929 Other chronic pain: Secondary | ICD-10-CM

## 2021-01-24 DIAGNOSIS — I10 Essential (primary) hypertension: Secondary | ICD-10-CM

## 2021-01-24 DIAGNOSIS — M48062 Spinal stenosis, lumbar region with neurogenic claudication: Secondary | ICD-10-CM

## 2021-01-24 MED ORDER — METOPROLOL TARTRATE 25 MG PO TABS: 12.5000 mg | ORAL_TABLET | Freq: Two times a day (BID) | ORAL | 3 refills | Status: DC

## 2021-01-24 MED ORDER — TRAMADOL HCL 50 MG PO TABS: 50.0000 mg | ORAL_TABLET | Freq: Four times a day (QID) | ORAL | 2 refills | Status: DC | PRN

## 2021-01-26 ENCOUNTER — Other Ambulatory Visit: Payer: Self-pay | Admitting: Family Medicine

## 2021-01-26 DIAGNOSIS — I890 Lymphedema, not elsewhere classified: Secondary | ICD-10-CM

## 2021-01-26 DIAGNOSIS — I5032 Chronic diastolic (congestive) heart failure: Secondary | ICD-10-CM

## 2021-02-02 ENCOUNTER — Ambulatory Visit: Payer: PPO | Admitting: Family Medicine

## 2021-02-02 ENCOUNTER — Other Ambulatory Visit: Payer: Self-pay | Admitting: Family Medicine

## 2021-02-03 ENCOUNTER — Ambulatory Visit: Payer: PPO | Admitting: Family Medicine

## 2021-02-03 ENCOUNTER — Telehealth: Payer: Self-pay | Admitting: Primary Care

## 2021-02-03 NOTE — Telephone Encounter (Signed)
Spoke with patient about the Palliative referral and patient has declined services again, she doesn't feel that she needs our services just yet.  Told patient I would cancel the referral and notify Dr. Parks Ranger of this and she was in agreement.

## 2021-02-09 ENCOUNTER — Ambulatory Visit: Payer: PPO | Admitting: Family Medicine

## 2021-02-20 ENCOUNTER — Encounter: Payer: Self-pay | Admitting: Family Medicine

## 2021-02-20 ENCOUNTER — Other Ambulatory Visit: Payer: Self-pay

## 2021-02-20 ENCOUNTER — Ambulatory Visit (INDEPENDENT_AMBULATORY_CARE_PROVIDER_SITE_OTHER): Payer: PPO | Admitting: Family Medicine

## 2021-02-20 VITALS — Ht 60.0 in | Wt 78.0 lb

## 2021-02-20 DIAGNOSIS — N3000 Acute cystitis without hematuria: Secondary | ICD-10-CM | POA: Diagnosis not present

## 2021-02-20 MED ORDER — CIPROFLOXACIN HCL 500 MG PO TABS: 500.0000 mg | ORAL_TABLET | Freq: Two times a day (BID) | ORAL | 0 refills | Status: DC

## 2021-02-20 NOTE — Progress Notes (Signed)
Virtual Visit via Telephone The purpose of this virtual visit is to provide medical care while limiting exposure to the novel coronavirus (COVID19) for both patient and office staff.  Consent was obtained for phone visit:  Yes.   Answered questions that patient had about telehealth interaction:  Yes.   I discussed the limitations, risks, security and privacy concerns of performing an evaluation and management service by telephone. I also discussed with the patient that there may be a patient responsible charge related to this service. The patient expressed understanding and agreed to proceed.  Patient Location: Home Provider Location: [facility] (Office)  Participants in virtual visit: - Patient: Jennifer Skinner - CMA: Orinda Kenner, [place] - Provider: Dr Parks Ranger  ---------------------------------------------------------------------- Chief Complaint  Patient presents with  . painful urination    S: Reviewed CMA documentation. I have called patient and gathered additional HPI as follows:  Acute Cystitis UTI Reports that symptoms started 2-3 days ago dysuria with urination and urinary frequency similar to prior UTI. Unable to come to office now for urine sample. Previously treated with Cipro earlier in 2021 with success. PCN allergy  Denies any known or suspected exposure to person with or possibly with COVID19.  Denies any fevers, chills, sweats, body ache, cough, shortness of breath, sinus pain or pressure, headache, abdominal pain, diarrhea  Past Medical History:  Diagnosis Date  . 1st degree AV block   . Arthritis   . Chronic diastolic CHF (congestive heart failure) ([place])    a. echo 2014: EF 55-60%, nl wall motion, GR1DD, mild MR, moder mitral prolapse, PASP 40 mm Hg; b. echo 09/2015: EF 60-65%, nl wall motion, GR1DD, mod MR, mild to mod TR, PASP 56 mm Hg  . COPD (chronic obstructive pulmonary disease) ([place])   . Degenerative disc disease, lumbar   .  Gastro-esophageal reflux   . Hyperlipidemia   . Mitral prolapse   . Mitral regurgitation   . Osteoporosis    Social History   Tobacco Use  . Smoking status: Never Smoker  . Smokeless tobacco: Never Used  Vaping Use  . Vaping Use: Never used  Substance Use Topics  . Alcohol use: No    Alcohol/week: 0.0 standard drinks  . Drug use: No    Current Outpatient Medications:  .  acetaminophen (TYLENOL) 500 MG tablet, Take 500 mg by mouth every 6 (six) hours as needed., Disp: , Rfl:  .  albuterol (PROVENTIL) (2.5 MG/3ML) 0.083% nebulizer solution, Take 3 mLs (2.5 mg total) by nebulization every 6 (six) hours as needed for wheezing or shortness of breath., Disp: 75 mL, Rfl: 12 .  aspirin EC 81 MG tablet, Take 1 tablet (81 mg total) by mouth daily., Disp: 90 tablet, Rfl: 3 .  baclofen (LIORESAL) 10 MG tablet, Take 0.5-1 tablets (5-10 mg total) by mouth 3 (three) times daily as needed for muscle spasms., Disp: 30 each, Rfl: 2 .  Calcium Gluconate 500 MG CAPS, Take 2 capsules by mouth daily. , Disp: , Rfl:  .  ciprofloxacin (CIPRO) 500 MG tablet, Take 1 tablet (500 mg total) by mouth 2 (two) times daily., Disp: 14 tablet, Rfl: 0 .  gabapentin (NEURONTIN) 100 MG capsule, Take 1 capsule (100 mg total) by mouth 3 (three) times daily., Disp: 270 capsule, Rfl: 1 .  levothyroxine (SYNTHROID) 25 MCG tablet, Take 1 tablet (25 mcg total) by mouth daily before breakfast., Disp: 90 tablet, Rfl: 0 .  metoprolol tartrate (LOPRESSOR) 25 MG tablet, Take 0.5  tablets (12.5 mg total) by mouth 2 (two) times daily., Disp: 90 tablet, Rfl: 3 .  mirabegron ER (MYRBETRIQ) 25 MG TB24 tablet, Take 1 tablet (25 mg total) by mouth daily., Disp: 30 tablet, Rfl: 11 .  Multiple Vitamin (MULTI-VITAMIN DAILY PO), Take 1 tablet by mouth daily. , Disp: , Rfl:  .  Oyster Shell (OYSTER CALCIUM) 500 MG TABS, Take 500 mg of elemental calcium by mouth 2 (two) times daily., Disp: , Rfl:  .  polyethylene glycol powder (GLYCOLAX/MIRALAX)  powder, Take 17-34 g by mouth daily as needed., Disp: 250 g, Rfl: 2 .  potassium chloride (KLOR-CON) 10 MEQ tablet, Take 1 tablet (10 mEq total) by mouth 3 (three) times daily., Disp: 270 tablet, Rfl: 0 .  rosuvastatin (CRESTOR) 10 MG tablet, Take 1 tablet (10 mg total) by mouth at bedtime., Disp: 90 tablet, Rfl: 0 .  sildenafil (REVATIO) 20 MG tablet, Take 1 tablet (20 mg total) by mouth 3 (three) times daily. For pulmonary hypertension, Disp: 90 tablet, Rfl: 2 .  torsemide (DEMADEX) 20 MG tablet, Take 2 tablets (40 mg total) by mouth 2 (two) times daily., Disp: 120 tablet, Rfl: 0 .  traMADol (ULTRAM) 50 MG tablet, Take 1-2 tablets (50-100 mg total) by mouth every 6 (six) hours as needed for moderate pain. Take up to max 6 pills per 24 hours., Disp: 180 tablet, Rfl: 2  Depression screen [facility] 2/9 03/15/2020 12/08/2019 09/02/2019  Decreased Interest 0 0 0  Down, Depressed, Hopeless 0 0 0  PHQ - 2 Score 0 0 0  Altered sleeping - - 0  Tired, decreased energy - - 0  Change in appetite - - 0  Feeling bad or failure about yourself  - - 0  Trouble concentrating - - 0  Moving slowly or fidgety/restless - - 0  Suicidal thoughts - - 0  PHQ-9 Score - - 0  Difficult doing work/chores - - -    GAD 7 : Generalized Anxiety Score 09/04/2016  Nervous, Anxious, on Edge 0  Control/stop worrying 0  Worry too much - different things 0  Trouble relaxing 0  Restless 0  Easily annoyed or irritable 0  Afraid - awful might happen 0  Total GAD 7 Score 0  Anxiety Difficulty Not difficult at all    -------------------------------------------------------------------------- O: No physical exam performed due to remote telephone encounter.  Lab results reviewed.  No results found for this or any previous visit (from the past 2160 hour(s)).  -------------------------------------------------------------------------- A&P:  Problem List Items Addressed This Visit   None   Visit Diagnoses    Acute cystitis  without hematuria    -  Primary   Relevant Medications   ciprofloxacin (CIPRO) 500 MG tablet     Clinically consistent with UTI Virtual visit, patient unable to come in for UA / Urine culture, discussed the downside to treating this but she has had prior UTI similar and review prior cultures  No concern for pyelo today (no systemic symptoms, neg fever, back pain, n/v).  Plan: 1. Empiric Cipro 500 BID x 7 days 2. Improve PO hydration RTC if no improvement 1-2 weeks, red flags given to return sooner    Meds ordered this encounter  Medications  . ciprofloxacin (CIPRO) 500 MG tablet    Sig: Take 1 tablet (500 mg total) by mouth 2 (two) times daily.    Dispense:  14 tablet    Refill:  0    Follow-up: - Return in 1 week as needed  if unresolved  Patient verbalizes understanding with the above medical recommendations including the limitation of remote medical advice.  Specific follow-up and call-back criteria were given for patient to follow-up or seek medical care more urgently if needed.   - Time spent in direct consultation with patient on phone: 7 minutes  Nobie Putnam, [facility] 02/20/2021, 11:41 AM

## 2021-02-24 ENCOUNTER — Other Ambulatory Visit: Payer: Self-pay | Admitting: Family Medicine

## 2021-02-24 DIAGNOSIS — I5032 Chronic diastolic (congestive) heart failure: Secondary | ICD-10-CM

## 2021-02-24 DIAGNOSIS — I890 Lymphedema, not elsewhere classified: Secondary | ICD-10-CM

## 2021-02-27 ENCOUNTER — Other Ambulatory Visit: Payer: Self-pay | Admitting: Family Medicine

## 2021-02-27 DIAGNOSIS — E034 Atrophy of thyroid (acquired): Secondary | ICD-10-CM

## 2021-03-01 ENCOUNTER — Other Ambulatory Visit: Payer: Self-pay | Admitting: Family Medicine

## 2021-03-01 DIAGNOSIS — E7800 Pure hypercholesterolemia, unspecified: Secondary | ICD-10-CM

## 2021-03-01 NOTE — Telephone Encounter (Signed)
  Notes to clinic: Patient is due for appointment on 07/2021 Review for refill until  appt   Requested Prescriptions  Pending Prescriptions Disp Refills   rosuvastatin (CRESTOR) 10 MG tablet [Pharmacy Med Name: ROSUVASTATIN CALCIUM 10 MG TAB] 90 tablet 0    Sig: Take 1 tablet (10 mg total) by mouth at bedtime.      Cardiovascular:  Antilipid - Statins Failed - 03/01/2021 10:07 AM      Failed - Total Cholesterol in normal range and within 360 days    Cholesterol  Date Value Ref Range Status  08/26/2019 129 <200 mg/dL Final          Failed - LDL in normal range and within 360 days    LDL Cholesterol (Calc)  Date Value Ref Range Status  08/26/2019 64 mg/dL (calc) Final    Comment:    Reference range: <100 . Desirable range <100 mg/dL for primary prevention;   <70 mg/dL for patients with CHD or diabetic patients  with > or = 2 CHD risk factors. Marland Kitchen LDL-C is now calculated using the Martin-Hopkins  calculation, which is a validated novel method providing  better accuracy than the Friedewald equation in the  estimation of LDL-C.  Cresenciano Genre et al. Annamaria Helling. 5277;824(23): 2061-2068  (http://education.[employer].com/faq/FAQ164)           Failed - HDL in normal range and within 360 days    HDL  Date Value Ref Range Status  08/26/2019 51 > OR = 50 mg/dL Final          Failed - Triglycerides in normal range and within 360 days    Triglycerides  Date Value Ref Range Status  08/26/2019 56 <150 mg/dL Final          Passed - Patient is not pregnant      Passed - Valid encounter within last 12 months    Recent Outpatient Visits           1 week ago Acute cystitis without hematuria   [place], DO   2 months ago No-show for appointment   [place], DO   5 months ago Tremors of nervous system   [place], DO   7 months ago Chronic midline low  back pain without sciatica   [place], DO   7 months ago Chronic midline low back pain without sciatica   [place], DO       Future Appointments             In 3 weeks Gollan, Kathlene November, MD [facility], [place]   In 4 months Parks Ranger, Devonne Doughty, [facility], [facility]

## 2021-03-03 ENCOUNTER — Encounter: Payer: Self-pay | Admitting: Family Medicine

## 2021-03-03 ENCOUNTER — Other Ambulatory Visit: Payer: Self-pay

## 2021-03-03 ENCOUNTER — Ambulatory Visit (INDEPENDENT_AMBULATORY_CARE_PROVIDER_SITE_OTHER): Payer: PPO | Admitting: Family Medicine

## 2021-03-03 VITALS — Ht 59.0 in | Wt 78.0 lb

## 2021-03-03 DIAGNOSIS — R310 Gross hematuria: Secondary | ICD-10-CM

## 2021-03-03 DIAGNOSIS — N3001 Acute cystitis with hematuria: Secondary | ICD-10-CM | POA: Diagnosis not present

## 2021-03-03 MED ORDER — CIPROFLOXACIN HCL 500 MG PO TABS: 500.0000 mg | ORAL_TABLET | Freq: Two times a day (BID) | ORAL | 0 refills | Status: DC

## 2021-03-03 NOTE — Progress Notes (Signed)
Virtual Visit via Telephone The purpose of this virtual visit is to provide medical care while limiting exposure to the novel coronavirus (COVID19) for both patient and office staff.  Consent was obtained for phone visit:  Yes.   Answered questions that patient had about telehealth interaction:  Yes.   I discussed the limitations, risks, security and privacy concerns of performing an evaluation and management service by telephone. I also discussed with the patient that there may be a patient responsible charge related to this service. The patient expressed understanding and agreed to proceed.  Patient Location: Home Provider Location: [facility] (Office)  Participants in virtual visit: - Patient: Jennifer Skinner - CMA: Orinda Kenner, [place] - Provider: Dr Parks Ranger  ---------------------------------------------------------------------- Chief Complaint  Patient presents with  . Urinary Frequency    S: Reviewed CMA documentation. I have called patient and gathered additional HPI as follows:  Recurrent UTI / Gross Hematuria Reports that symptoms started with urinary retention, difficulty with voiding, she put pressure on abdomen for bladder with urination but still difficulty. Has had few more episodes of bleeding, admits she can see the blood with gross hematuria. - the cipro last week helped 7 day course did improve then worsened again.  Unable to come to office now for urine sample. Previously treated with Cipro earlier in 2021 with success. PCN allergy  Denies any known or suspected exposure to person with or possibly with COVID19.  Denies any fevers, chills, sweats, body ache, cough, shortness of breath, sinus pain or pressure, headache, abdominal pain, diarrhea  Past Medical History:  Diagnosis Date  . 1st degree AV block   . Arthritis   . Chronic diastolic CHF (congestive heart failure) ([place])    a. echo 2014: EF 55-60%, nl wall motion, GR1DD, mild MR,  moder mitral prolapse, PASP 40 mm Hg; b. echo 09/2015: EF 60-65%, nl wall motion, GR1DD, mod MR, mild to mod TR, PASP 56 mm Hg  . COPD (chronic obstructive pulmonary disease) ([place])   . Degenerative disc disease, lumbar   . Gastro-esophageal reflux   . Hyperlipidemia   . Mitral prolapse   . Mitral regurgitation   . Osteoporosis    Social History   Tobacco Use  . Smoking status: Never Smoker  . Smokeless tobacco: Never Used  Vaping Use  . Vaping Use: Never used  Substance Use Topics  . Alcohol use: No    Alcohol/week: 0.0 standard drinks  . Drug use: No    Current Outpatient Medications:  .  acetaminophen (TYLENOL) 500 MG tablet, Take 500 mg by mouth every 6 (six) hours as needed., Disp: , Rfl:  .  albuterol (PROVENTIL) (2.5 MG/3ML) 0.083% nebulizer solution, Take 3 mLs (2.5 mg total) by nebulization every 6 (six) hours as needed for wheezing or shortness of breath., Disp: 75 mL, Rfl: 12 .  aspirin EC 81 MG tablet, Take 1 tablet (81 mg total) by mouth daily., Disp: 90 tablet, Rfl: 3 .  baclofen (LIORESAL) 10 MG tablet, Take 0.5-1 tablets (5-10 mg total) by mouth 3 (three) times daily as needed for muscle spasms., Disp: 30 each, Rfl: 2 .  Calcium Gluconate 500 MG CAPS, Take 2 capsules by mouth daily. , Disp: , Rfl:  .  ciprofloxacin (CIPRO) 500 MG tablet, Take 1 tablet (500 mg total) by mouth 2 (two) times daily. For 5 more days, Disp: 10 tablet, Rfl: 0 .  gabapentin (NEURONTIN) 100 MG capsule, Take 1 capsule (100 mg total) by mouth  3 (three) times daily., Disp: 270 capsule, Rfl: 1 .  levothyroxine (SYNTHROID) 25 MCG tablet, Take 1 tablet (25 mcg total) by mouth daily before breakfast., Disp: 90 tablet, Rfl: 0 .  metoprolol tartrate (LOPRESSOR) 25 MG tablet, Take 0.5 tablets (12.5 mg total) by mouth 2 (two) times daily., Disp: 90 tablet, Rfl: 3 .  mirabegron ER (MYRBETRIQ) 25 MG TB24 tablet, Take 1 tablet (25 mg total) by mouth daily., Disp: 30 tablet, Rfl: 11 .  Multiple Vitamin  (MULTI-VITAMIN DAILY PO), Take 1 tablet by mouth daily. , Disp: , Rfl:  .  Oyster Shell (OYSTER CALCIUM) 500 MG TABS, Take 500 mg of elemental calcium by mouth 2 (two) times daily., Disp: , Rfl:  .  polyethylene glycol powder (GLYCOLAX/MIRALAX) powder, Take 17-34 g by mouth daily as needed., Disp: 250 g, Rfl: 2 .  potassium chloride (KLOR-CON) 10 MEQ tablet, Take 1 tablet (10 mEq total) by mouth 3 (three) times daily., Disp: 270 tablet, Rfl: 0 .  rosuvastatin (CRESTOR) 10 MG tablet, Take 1 tablet (10 mg total) by mouth at bedtime., Disp: 90 tablet, Rfl: 1 .  sildenafil (REVATIO) 20 MG tablet, Take 1 tablet (20 mg total) by mouth 3 (three) times daily. For pulmonary hypertension, Disp: 90 tablet, Rfl: 2 .  torsemide (DEMADEX) 20 MG tablet, Take 2 tablets (40 mg total) by mouth 2 (two) times daily., Disp: 120 tablet, Rfl: 0 .  traMADol (ULTRAM) 50 MG tablet, Take 1-2 tablets (50-100 mg total) by mouth every 6 (six) hours as needed for moderate pain. Take up to max 6 pills per 24 hours., Disp: 180 tablet, Rfl: 2  Depression screen [facility] 2/9 03/15/2020 12/08/2019 09/02/2019  Decreased Interest 0 0 0  Down, Depressed, Hopeless 0 0 0  PHQ - 2 Score 0 0 0  Altered sleeping - - 0  Tired, decreased energy - - 0  Change in appetite - - 0  Feeling bad or failure about yourself  - - 0  Trouble concentrating - - 0  Moving slowly or fidgety/restless - - 0  Suicidal thoughts - - 0  PHQ-9 Score - - 0  Difficult doing work/chores - - -    GAD 7 : Generalized Anxiety Score 09/04/2016  Nervous, Anxious, on Edge 0  Control/stop worrying 0  Worry too much - different things 0  Trouble relaxing 0  Restless 0  Easily annoyed or irritable 0  Afraid - awful might happen 0  Total GAD 7 Score 0  Anxiety Difficulty Not difficult at all    -------------------------------------------------------------------------- O: No physical exam performed due to remote telephone encounter.  Lab results reviewed.  No  results found for this or any previous visit (from the past 2160 hour(s)).  -------------------------------------------------------------------------- A&P:  Problem List Items Addressed This Visit   None   Visit Diagnoses    Acute cystitis with hematuria    -  Primary   Relevant Medications   ciprofloxacin (CIPRO) 500 MG tablet   Gross hematuria         Clinically consistent with UTI now with hematuria - seems either recurrent issue or can have other pathology causing the bleeding, seems more gross hematuria now.  Again Virtual visit, patient unable to come in for UA / Urine culture, discussed the downside to treating this but she has had prior UTI similar and review prior cultures  No concern for pyelo today (no systemic symptoms, neg fever, back pain, n/v).  Plan: Advised patient that I would strongly recommend  Urology referral at this time given bleeding and difficulty voiding. She declines referral again. I have limited options for her at this point, she was unable to go to urgent care or ED She prefers to trial the Cipro once more since she did improve on it. Will agree to order Cipro 500 BID x 5 more days if she calls [place] early next week and if unresolved we will refer to Urology  Again I advised her that we could refer to Palliative Care for home visits for symptom management help, since difficulty with multiple chronic conditions and frailty difficult to treat outpatient at this time.   Meds ordered this encounter  Medications  . ciprofloxacin (CIPRO) 500 MG tablet    Sig: Take 1 tablet (500 mg total) by mouth 2 (two) times daily. For 5 more days    Dispense:  10 tablet    Refill:  0    Follow-up: Call office ASAP early next week if not better, will need Urology referral  Patient verbalizes understanding with the above medical recommendations including the limitation of remote medical advice.  Specific follow-up and call-back criteria were given for patient to  follow-up or seek medical care more urgently if needed.   - Time spent in direct consultation with patient on phone: 8 minutes   Nobie Putnam, [facility] 03/03/2021, 11:46 AM

## 2021-03-10 ENCOUNTER — Other Ambulatory Visit: Payer: Self-pay | Admitting: Family Medicine

## 2021-03-10 DIAGNOSIS — I10 Essential (primary) hypertension: Secondary | ICD-10-CM

## 2021-03-22 ENCOUNTER — Ambulatory Visit: Payer: PPO | Admitting: Cardiovascular Disease

## 2021-03-23 ENCOUNTER — Other Ambulatory Visit: Payer: Self-pay | Admitting: Family Medicine

## 2021-03-23 DIAGNOSIS — I5032 Chronic diastolic (congestive) heart failure: Secondary | ICD-10-CM

## 2021-03-23 DIAGNOSIS — I890 Lymphedema, not elsewhere classified: Secondary | ICD-10-CM

## 2021-03-23 NOTE — Telephone Encounter (Signed)
  Notes to clinic:  Patient is not due for appointment until 07/25/2021 Review for refill until that time    Requested Prescriptions  Pending Prescriptions Disp Refills   torsemide (DEMADEX) 20 MG tablet [Pharmacy Med Name: TORSEMIDE 20 MG TABLET] 120 tablet 0    Sig: Take 2 tablets (40 mg total) by mouth 2 (two) times daily.      Cardiovascular:  Diuretics - Loop Failed - 03/23/2021 11:01 AM      Failed - Valid encounter within last 6 months    Recent Outpatient Visits           2 weeks ago Acute cystitis with hematuria   [place], DO   1 month ago Acute cystitis without hematuria   [place], DO   2 months ago No-show for appointment   [facility] [place], Devonne Doughty, DO   6 months ago Tremors of nervous system   [place], DO   8 months ago Chronic midline low back pain without sciatica   [place], DO       Future Appointments             In 4 months Parks Ranger, Devonne Doughty, DO [facility], [place] in normal range and within 360 days    Potassium  Date Value Ref Range Status  09/20/2020 4.6 3.5 - 5.3 mmol/L Final  09/12/2013 4.0 3.5 - 5.1 mmol/L Final          Passed - Ca in normal range and within 360 days    Calcium  Date Value Ref Range Status  09/20/2020 9.5 8.6 - 10.4 mg/dL Final   Calcium, Total  Date Value Ref Range Status  09/12/2013 9.0 8.5 - 10.1 mg/dL Final          Passed - Na in normal range and within 360 days    Sodium  Date Value Ref Range Status  09/20/2020 141 135 - 146 mmol/L Final  08/18/2018 141 134 - 144 mmol/L Final  09/12/2013 138 136 - 145 mmol/L Final          Passed - Cr in normal range and within 360 days    Creat  Date Value Ref Range Status  09/20/2020 0.67 0.60 - 0.88 mg/dL Final     Comment:    For patients >59 years of age, the reference limit for Creatinine is approximately 13% higher for people identified as African-American. .           Passed - Last BP in normal range    BP Readings from Last 1 Encounters:  09/20/20 (!) 111/51

## 2021-03-29 ENCOUNTER — Other Ambulatory Visit: Payer: Self-pay | Admitting: Family Medicine

## 2021-03-29 DIAGNOSIS — I272 Pulmonary hypertension, unspecified: Secondary | ICD-10-CM

## 2021-04-10 ENCOUNTER — Other Ambulatory Visit: Payer: Self-pay | Admitting: Family Medicine

## 2021-04-10 DIAGNOSIS — M47817 Spondylosis without myelopathy or radiculopathy, lumbosacral region: Secondary | ICD-10-CM

## 2021-04-10 DIAGNOSIS — M5136 Other intervertebral disc degeneration, lumbar region: Secondary | ICD-10-CM

## 2021-04-10 DIAGNOSIS — M5416 Radiculopathy, lumbar region: Secondary | ICD-10-CM

## 2021-04-25 ENCOUNTER — Ambulatory Visit: Payer: PPO

## 2021-05-09 ENCOUNTER — Ambulatory Visit: Payer: PPO

## 2021-05-09 ENCOUNTER — Telehealth: Payer: Self-pay

## 2021-05-09 NOTE — Telephone Encounter (Signed)
This nurse called patient in order to perform scheduled telephonic AWV for both her and her spouse. She stated that she does not have time right now to do it. We rescheduled to 6/28.

## 2021-05-15 ENCOUNTER — Other Ambulatory Visit: Payer: Self-pay | Admitting: Family Medicine

## 2021-05-15 DIAGNOSIS — M48062 Spinal stenosis, lumbar region with neurogenic claudication: Secondary | ICD-10-CM

## 2021-05-15 DIAGNOSIS — M545 Low back pain, unspecified: Secondary | ICD-10-CM

## 2021-05-15 NOTE — Telephone Encounter (Signed)
Requested medication (s) are due for refill today: yes  Requested medication (s) are on the active medication list: yes   Last refill: 04/07/2021  Future visit scheduled: yes   Notes to clinic: this refill cannot be delegated    Requested Prescriptions  Pending Prescriptions Disp Refills   traMADol (ULTRAM) 50 MG tablet [Pharmacy Med Name: TRAMADOL HCL 50 MG TABLET] 180 tablet 0    Sig: Take 1-2 tablets (50-100 mg total) by mouth every 6 (six) hours as needed for moderate pain. Take up to max 6 pills per 24 hours.      Not Delegated - Analgesics:  Opioid Agonists Failed - 05/15/2021  9:55 AM      Failed - This refill cannot be delegated      Failed - Urine Drug Screen completed in last 360 days      Failed - Valid encounter within last 6 months    Recent Outpatient Visits           2 months ago Acute cystitis with hematuria   [place], DO   2 months ago Acute cystitis without hematuria   [place], DO   4 months ago No-show for appointment   [place], DO   7 months ago Tremors of nervous system   [place], DO   10 months ago Chronic midline low back pain without sciatica   [place], DO       Future Appointments             In 3 weeks  [facility], [place]   In 1 month Gollan, Kathlene November, MD [facility], [place]   In 2 months Parks Ranger, Devonne Doughty, [facility], [facility]

## 2021-05-18 ENCOUNTER — Other Ambulatory Visit: Payer: Self-pay | Admitting: Family Medicine

## 2021-05-18 DIAGNOSIS — N3281 Overactive bladder: Secondary | ICD-10-CM

## 2021-05-22 ENCOUNTER — Other Ambulatory Visit: Payer: Self-pay | Admitting: Family Medicine

## 2021-05-22 DIAGNOSIS — I272 Pulmonary hypertension, unspecified: Secondary | ICD-10-CM

## 2021-06-06 ENCOUNTER — Telehealth: Payer: Self-pay

## 2021-06-06 ENCOUNTER — Ambulatory Visit: Payer: PPO

## 2021-06-06 NOTE — Telephone Encounter (Signed)
This nurse got a message from the office that the patient and her spouse showed up at the office for their visits. They told the office that they were 5 minutes from home. I called from 1055 to 1135 in order to do the visits. When she answered she stated that she wanted to reschedule for another day. Rescheduled to 06/20/2021.

## 2021-06-08 ENCOUNTER — Other Ambulatory Visit: Payer: Self-pay | Admitting: Family Medicine

## 2021-06-08 DIAGNOSIS — E034 Atrophy of thyroid (acquired): Secondary | ICD-10-CM

## 2021-06-08 DIAGNOSIS — I10 Essential (primary) hypertension: Secondary | ICD-10-CM

## 2021-06-15 ENCOUNTER — Other Ambulatory Visit: Payer: Self-pay | Admitting: Family Medicine

## 2021-06-15 DIAGNOSIS — I890 Lymphedema, not elsewhere classified: Secondary | ICD-10-CM

## 2021-06-15 DIAGNOSIS — I5032 Chronic diastolic (congestive) heart failure: Secondary | ICD-10-CM

## 2021-06-15 NOTE — Telephone Encounter (Signed)
   Notes to clinic:  Patient has upcoming appt on 07/25/2021 Review for refill until that time    Requested Prescriptions  Pending Prescriptions Disp Refills   torsemide (DEMADEX) 20 MG tablet [Pharmacy Med Name: TORSEMIDE 20 MG TABLET] 120 tablet 0    Sig: Take 2 tablets (40 mg total) by mouth 2 (two) times daily.      Cardiovascular:  Diuretics - Loop Failed - 06/15/2021 10:48 AM      Failed - Valid encounter within last 6 months    Recent Outpatient Visits           3 months ago Acute cystitis with hematuria   [place], DO   3 months ago Acute cystitis without hematuria   [place], DO   5 months ago No-show for appointment   [facility] [place], Devonne Doughty, DO   8 months ago Tremors of nervous system   [place], DO   11 months ago Chronic midline low back pain without sciatica   [place], DO       Future Appointments             In 5 days  [facility], [place]   In 1 week Rockey Situ, Kathlene November, MD [facility], [facility]   In 1 month [place], Devonne Doughty, [facility], [place] in normal range and within 360 days    Potassium  Date Value Ref Range Status  09/20/2020 4.6 3.5 - 5.3 mmol/L Final  09/12/2013 4.0 3.5 - 5.1 mmol/L Final          Passed - Ca in normal range and within 360 days    Calcium  Date Value Ref Range Status  09/20/2020 9.5 8.6 - 10.4 mg/dL Final   Calcium, Total  Date Value Ref Range Status  09/12/2013 9.0 8.5 - 10.1 mg/dL Final          Passed - Na in normal range and within 360 days    Sodium  Date Value Ref Range Status  09/20/2020 141 135 - 146 mmol/L Final  08/18/2018 141 134 - 144 mmol/L Final  09/12/2013 138 136 - 145 mmol/L Final          Passed - Cr in normal range  and within 360 days    Creat  Date Value Ref Range Status  09/20/2020 0.67 0.60 - 0.88 mg/dL Final    Comment:    For patients >82 years of age, the reference limit for Creatinine is approximately 13% higher for people identified as African-American. .           Passed - Last BP in normal range    BP Readings from Last 1 Encounters:  09/20/20 (!) 111/51

## 2021-06-20 ENCOUNTER — Other Ambulatory Visit: Payer: Self-pay | Admitting: Family Medicine

## 2021-06-20 ENCOUNTER — Ambulatory Visit (INDEPENDENT_AMBULATORY_CARE_PROVIDER_SITE_OTHER): Payer: PPO

## 2021-06-20 VITALS — Ht 59.0 in | Wt 77.0 lb

## 2021-06-20 DIAGNOSIS — Z0000 Encounter for general adult medical examination without abnormal findings: Secondary | ICD-10-CM

## 2021-06-20 DIAGNOSIS — E034 Atrophy of thyroid (acquired): Secondary | ICD-10-CM

## 2021-06-20 DIAGNOSIS — I272 Pulmonary hypertension, unspecified: Secondary | ICD-10-CM

## 2021-06-20 NOTE — Patient Instructions (Signed)
[name] , Thank you for taking time to come for your Medicare Wellness Visit. I appreciate your ongoing commitment to your health goals. Please review the following plan we discussed and let me know if I can assist you in the future.   Screening recommendations/referrals: Colonoscopy: not required Mammogram: not required Bone Density: completed 12/11/2007 Recommended yearly ophthalmology/optometry visit for glaucoma screening and checkup Recommended yearly dental visit for hygiene and checkup  Vaccinations: Influenza vaccine: due 07/10/2021 Pneumococcal vaccine: completed 01/14/2015 Tdap vaccine: completed 10/17/2015, due 10/16/2025 Shingles vaccine: discussed   Covid-19: 03/10/2020, 02/08/2020  Advanced directives: Please bring a copy of your POA (Power of Attorney) and/or Living Will to your next appointment.   Conditions/risks identified: none  Next appointment: Follow up in one year for your annual wellness visit    Preventive Care 65 Years and Older, Female Preventive care refers to lifestyle choices and visits with your health care provider that can promote health and wellness. What does preventive care include? A yearly physical exam. This is also called an annual well check. Dental exams once or twice a year. Routine eye exams. Ask your health care provider how often you should have your eyes checked. Personal lifestyle choices, including: Daily care of your teeth and gums. Regular physical activity. Eating a healthy diet. Avoiding tobacco and drug use. Limiting alcohol use. Practicing safe sex. Taking low-dose aspirin every day. Taking vitamin and mineral supplements as recommended by your health care provider. What happens during an annual well check? The services and screenings done by your health care provider during your annual well check will depend on your age, overall health, lifestyle risk factors, and family history of disease. Counseling  Your health care provider  may ask you questions about your: Alcohol use. Tobacco use. Drug use. Emotional well-being. Home and relationship well-being. Sexual activity. Eating habits. History of falls. Memory and ability to understand (cognition). Work and work Statistician. Reproductive health. Screening  You may have the following tests or measurements: Height, weight, and BMI. Blood pressure. Lipid and cholesterol levels. These may be checked every 5 years, or more frequently if you are over 31 years old. Skin check. Lung cancer screening. You may have this screening every year starting at age 78 if you have a 30-pack-year history of smoking and currently smoke or have quit within the past 15 years. Fecal occult blood test (FOBT) of the stool. You may have this test every year starting at age 42. Flexible sigmoidoscopy or colonoscopy. You may have a sigmoidoscopy every 5 years or a colonoscopy every 10 years starting at age 93. Hepatitis C blood test. Hepatitis B blood test. Sexually transmitted disease (STD) testing. Diabetes screening. This is done by checking your blood sugar (glucose) after you have not eaten for a while (fasting). You may have this done every 1-3 years. Bone density scan. This is done to screen for osteoporosis. You may have this done starting at age 66. Mammogram. This may be done every 1-2 years. Talk to your health care provider about how often you should have regular mammograms. Talk with your health care provider about your test results, treatment options, and if necessary, the need for more tests. Vaccines  Your health care provider may recommend certain vaccines, such as: Influenza vaccine. This is recommended every year. Tetanus, diphtheria, and acellular pertussis (Tdap, Td) vaccine. You may need a Td booster every 10 years. Zoster vaccine. You may need this after age 36. Pneumococcal 13-valent conjugate (PCV13) vaccine. One dose is  recommended after age 66. Pneumococcal  polysaccharide (PPSV23) vaccine. One dose is recommended after age 43. Talk to your health care provider about which screenings and vaccines you need and how often you need them. This information is not intended to replace advice given to you by your health care provider. Make sure you discuss any questions you have with your health care provider. Document Released: 12/23/2015 Document Revised: 08/15/2016 Document Reviewed: 09/27/2015 Elsevier Interactive Patient Education  2017 [place] Prevention in the Home Falls can cause injuries. They can happen to people of all ages. There are many things you can do to make your home safe and to help prevent falls. What can I do on the outside of my home? Regularly fix the edges of walkways and driveways and fix any cracks. Remove anything that might make you trip as you walk through a door, such as a raised step or threshold. Trim any bushes or trees on the path to your home. Use bright outdoor lighting. Clear any walking paths of anything that might make someone trip, such as rocks or tools. Regularly check to see if handrails are loose or broken. Make sure that both sides of any steps have handrails. Any raised decks and porches should have guardrails on the edges. Have any leaves, snow, or ice cleared regularly. Use sand or salt on walking paths during winter. Clean up any spills in your garage right away. This includes oil or grease spills. What can I do in the bathroom? Use night lights. Install grab bars by the toilet and in the tub and shower. Do not use towel bars as grab bars. Use non-skid mats or decals in the tub or shower. If you need to sit down in the shower, use a plastic, non-slip stool. Keep the floor dry. Clean up any water that spills on the floor as soon as it happens. Remove soap buildup in the tub or shower regularly. Attach bath mats securely with double-sided non-slip rug tape. Do not have throw rugs and other  things on the floor that can make you trip. What can I do in the bedroom? Use night lights. Make sure that you have a light by your bed that is easy to reach. Do not use any sheets or blankets that are too big for your bed. They should not hang down onto the floor. Have a firm chair that has side arms. You can use this for support while you get dressed. Do not have throw rugs and other things on the floor that can make you trip. What can I do in the kitchen? Clean up any spills right away. Avoid walking on wet floors. Keep items that you use a lot in easy-to-reach places. If you need to reach something above you, use a strong step stool that has a grab bar. Keep electrical cords out of the way. Do not use floor polish or wax that makes floors slippery. If you must use wax, use non-skid floor wax. Do not have throw rugs and other things on the floor that can make you trip. What can I do with my stairs? Do not leave any items on the stairs. Make sure that there are handrails on both sides of the stairs and use them. Fix handrails that are broken or loose. Make sure that handrails are as long as the stairways. Check any carpeting to make sure that it is firmly attached to the stairs. Fix any carpet that is loose or worn. Avoid having  throw rugs at the top or bottom of the stairs. If you do have throw rugs, attach them to the floor with carpet tape. Make sure that you have a light switch at the top of the stairs and the bottom of the stairs. If you do not have them, ask someone to add them for you. What else can I do to help prevent falls? Wear shoes that: Do not have high heels. Have rubber bottoms. Are comfortable and fit you well. Are closed at the toe. Do not wear sandals. If you use a stepladder: Make sure that it is fully opened. Do not climb a closed stepladder. Make sure that both sides of the stepladder are locked into place. Ask someone to hold it for you, if possible. Clearly  mark and make sure that you can see: Any grab bars or handrails. First and last steps. Where the edge of each step is. Use tools that help you move around (mobility aids) if they are needed. These include: Canes. Walkers. Scooters. Crutches. Turn on the lights when you go into a dark area. Replace any light bulbs as soon as they burn out. Set up your furniture so you have a clear path. Avoid moving your furniture around. If any of your floors are uneven, fix them. If there are any pets around you, be aware of where they are. Review your medicines with your doctor. Some medicines can make you feel dizzy. This can increase your chance of falling. Ask your doctor what other things that you can do to help prevent falls. This information is not intended to replace advice given to you by your health care provider. Make sure you discuss any questions you have with your health care provider. Document Released: 09/22/2009 Document Revised: 05/03/2016 Document Reviewed: 12/31/2014 Elsevier Interactive Patient Education  2017 [employer].

## 2021-06-20 NOTE — Progress Notes (Signed)
I connected with Cecilie Kicks today by telephone and verified that I am speaking with the correct person using two identifiers. Location patient: home Location provider: work Persons participating in the virtual visit: Rindi, [place] LPN.   I discussed the limitations, risks, security and privacy concerns of performing an evaluation and management service by telephone and the availability of in person appointments. I also discussed with the patient that there may be a patient responsible charge related to this service. The patient expressed understanding and verbally consented to this telephonic visit.    Interactive audio and video telecommunications were attempted between this provider and patient, however failed, due to patient having technical difficulties OR patient did not have access to video capability.  We continued and completed visit with audio only.     Vital signs may be patient reported or missing.  Subjective:   Jennifer Skinner is a 87 y.o. female who presents for Medicare Annual (Subsequent) preventive examination.  Review of Systems     Cardiac Risk Factors include: advanced age (>34men, >6 women);sedentary lifestyle     Objective:    Today's Vitals   06/20/21 1056  Weight: 77 lb (34.9 kg)  Height: 4\' 11"  (1.499 m)   Body mass index is 15.55 kg/m.  Advanced Directives 06/20/2021 03/15/2020 01/07/2018 09/05/2017 09/02/2017 08/26/2017 03/19/2017  Does Patient Have a Medical Advance Directive? Yes Yes No No Yes Yes Yes  Type of Paramedic of [place];Living will Living will;Healthcare Power of [place];Living will [place];Living will [place];Living will  Does patient want to make changes to medical advance directive? - - - - - - -  Copy of [place] in Chart? No - copy requested No - copy requested - - - - -  Would patient like  information on creating a medical advance directive? - - No - Patient declined - - - -    Current Medications (verified) Outpatient Encounter Medications as of 06/20/2021  Medication Sig   acetaminophen (TYLENOL) 500 MG tablet Take 500 mg by mouth every 6 (six) hours as needed.   albuterol (PROVENTIL) (2.5 MG/3ML) 0.083% nebulizer solution Take 3 mLs (2.5 mg total) by nebulization every 6 (six) hours as needed for wheezing or shortness of breath.   aspirin EC 81 MG tablet Take 1 tablet (81 mg total) by mouth daily.   gabapentin (NEURONTIN) 100 MG capsule Take 1 capsule (100 mg total) by mouth 3 (three) times daily.   levothyroxine (SYNTHROID) 25 MCG tablet Take 1 tablet (25 mcg total) by mouth daily before breakfast.   metoprolol tartrate (LOPRESSOR) 25 MG tablet Take 0.5 tablets (12.5 mg total) by mouth 2 (two) times daily.   Multiple Vitamin (MULTI-VITAMIN DAILY PO) Take 1 tablet by mouth daily.    MYRBETRIQ 25 MG TB24 tablet Take 1 tablet (25 mg total) by mouth daily.   Oyster Shell (OYSTER CALCIUM) 500 MG TABS Take 500 mg of elemental calcium by mouth 2 (two) times daily.   polyethylene glycol powder (GLYCOLAX/MIRALAX) powder Take 17-34 g by mouth daily as needed.   potassium chloride (KLOR-CON) 10 MEQ tablet Take 1 tablet (10 mEq total) by mouth 3 (three) times daily.   rosuvastatin (CRESTOR) 10 MG tablet Take 1 tablet (10 mg total) by mouth at bedtime.   sildenafil (REVATIO) 20 MG tablet Take 1 tablet (20 mg total) by mouth 3 (three) times daily. For pulmonary hypertension  torsemide (DEMADEX) 20 MG tablet Take 2 tablets (40 mg total) by mouth 2 (two) times daily.   traMADol (ULTRAM) 50 MG tablet Take 1-2 tablets (50-100 mg total) by mouth every 6 (six) hours as needed for moderate pain. Take up to max 6 pills per 24 hours.   baclofen (LIORESAL) 10 MG tablet Take 0.5-1 tablets (5-10 mg total) by mouth 3 (three) times daily as needed for muscle spasms.   Calcium Gluconate 500 MG CAPS Take  2 capsules by mouth daily.    ciprofloxacin (CIPRO) 500 MG tablet Take 1 tablet (500 mg total) by mouth 2 (two) times daily. For 5 more days   No facility-administered encounter medications on file as of 06/20/2021.    Allergies (verified) Penicillins   History: Past Medical History:  Diagnosis Date   1st degree AV block    Arthritis    Chronic diastolic CHF (congestive heart failure) ([place])    a. echo 2014: EF 55-60%, nl wall motion, GR1DD, mild MR, moder mitral prolapse, PASP 40 mm Hg; b. echo 09/2015: EF 60-65%, nl wall motion, GR1DD, mod MR, mild to mod TR, PASP 56 mm Hg   COPD (chronic obstructive pulmonary disease) (HCC)    Degenerative disc disease, lumbar    Gastro-esophageal reflux    Hyperlipidemia    Mitral prolapse    Mitral regurgitation    Osteoporosis    Past Surgical History:  Procedure Laterality Date   ABDOMINAL HYSTERECTOMY     APPENDECTOMY     DILATION AND CURETTAGE OF UTERUS     HYSTEROTOMY     right breast biopsy     right eye      surgery   TONSILLECTOMY     Family History  Problem Relation Age of Onset   CVA Mother 87   Heart attack Father 36   Throat cancer Sister    CAD Brother    Social History   Socioeconomic History   Marital status: Married    Spouse name: Not on file   Number of children: Not on file   Years of education: Not on file   Highest education level: Not on file  Occupational History   Not on file  Tobacco Use   Smoking status: Never   Smokeless tobacco: Never  Vaping Use   Vaping Use: Never used  Substance and Sexual Activity   Alcohol use: No    Alcohol/week: 0.0 standard drinks   Drug use: No   Sexual activity: Not Currently  Other Topics Concern   Not on file  Social History Narrative   Not on file   Social Determinants of Health   Financial Resource Strain: Low Risk    Difficulty of Paying Living Expenses: Not hard at all  Food Insecurity: No Food Insecurity   Worried About Charity fundraiser in the  Last Year: Never true   Arboriculturist in the Last Year: Never true  Transportation Needs: No Transportation Needs   Lack of Transportation (Medical): No   Lack of Transportation (Non-Medical): No  Physical Activity: Inactive   Days of Exercise per Week: 0 days   Minutes of Exercise per Session: 0 min  Stress: No Stress Concern Present   Feeling of Stress : Not at all  Social Connections: Not on file    Tobacco Counseling Counseling given: Not Answered   Clinical Intake:  Pre-visit preparation completed: Yes  Pain : No/denies pain     Nutritional Status: BMI <19  Underweight  Nutritional Risks: None Diabetes: No  How often do you need to have someone help you when you read instructions, pamphlets, or other written materials from your doctor or pharmacy?: 1 - Never What is the last grade level you completed in school?: 12th grade  Diabetic? no  Interpreter Needed?: No  Information entered by :: NAllen LPN   Activities of Daily Living In your present state of health, do you have any difficulty performing the following activities: 06/20/2021  Hearing? N  Vision? Y  Difficulty concentrating or making decisions? Y  Walking or climbing stairs? N  Dressing or bathing? N  Doing errands, shopping? N  Preparing Food and eating ? N  Using the Toilet? N  In the past six months, have you accidently leaked urine? N  Do you have problems with loss of bowel control? N  Managing your Medications? N  Managing your Finances? N  Housekeeping or managing your Housekeeping? N  Some recent data might be hidden    Patient Care Team: Olin Hauser, DO as PCP - General (Family Medicine) Alisa Graff, FNP as Nurse Practitioner (Cardiology) Minna Merritts, MD as Consulting Physician (Cardiology)  Indicate any recent Medical Services you may have received from other than [facility] providers in the past year (date may be approximate).     Assessment:   This is a routine  wellness examination for Jennifer Skinner.  Hearing/Vision screen Vision Screening - Comments:: No regular eye exams, [facility]  Dietary issues and exercise activities discussed: Current Exercise Habits: The patient does not participate in regular exercise at present   Goals Addressed             This Visit's Progress    Patient Stated       06/20/2021, no goals        Depression Screen PHQ 2/9 Scores 06/20/2021 03/15/2020 12/08/2019 09/02/2019 07/02/2019 09/23/2018 08/19/2018  PHQ - 2 Score 0 0 0 0 0 0 0  PHQ- 9 Score - - - 0 - - -    Fall Risk Fall Risk  06/20/2021 07/06/2020 03/15/2020 12/08/2019 09/02/2019  Falls in the past year? 0 0 0 0 1  Number falls in past yr: - 0 0 0 0  Injury with Fall? - 0 0 0 0  Risk for fall due to : Impaired balance/gait;Medication side effect - - - -  Follow up Falls evaluation completed;Education provided;Falls prevention discussed Falls evaluation completed - Falls evaluation completed -    FALL RISK PREVENTION PERTAINING TO THE HOME:  Any stairs in or around the home? Yes  If so, are there any without handrails? No  Home free of loose throw rugs in walkways, pet beds, electrical cords, etc? Yes  Adequate lighting in your home to reduce risk of falls? Yes   ASSISTIVE DEVICES UTILIZED TO PREVENT FALLS:  Life alert? No  Use of a cane, walker or w/c? No  Grab bars in the bathroom? Yes  Shower chair or bench in shower? No  Elevated toilet seat or a handicapped toilet? No   TIMED UP AND GO:  Was the test performed? No .      Cognitive Function:     6CIT Screen 06/20/2021 03/04/2018  What Year? 0 points 0 points  What month? 0 points 0 points  What time? 0 points 0 points  Count back from 20 2 points 0 points  Months in reverse 0 points 0 points  Repeat phrase 2 points 2 points  Total  Score 4 2    Immunizations Immunization History  Administered Date(s) Administered   Fluad Quad(high Dose 65+) 09/02/2019   Influenza, High  Dose Seasonal PF 09/27/2015, 09/25/2016   Influenza,inj,quad, With Preservative 10/08/2018   Influenza-Unspecified 09/30/2012, 09/25/2017, 10/07/2018   Moderna Sars-Covid-2 Vaccination 02/08/2020, 03/10/2020   Pneumococcal Conjugate-13 05/18/2014   Pneumococcal Polysaccharide-23 09/30/2012    TDAP status: Up to date  Flu Vaccine status: Up to date  Pneumococcal vaccine status: Up to date  Covid-19 vaccine status: Completed vaccines  Qualifies for Shingles Vaccine? Yes   Zostavax completed No   Shingrix Completed?: No.    Education has been provided regarding the importance of this vaccine. Patient has been advised to call insurance company to determine out of pocket expense if they have not yet received this vaccine. Advised may also receive vaccine at local pharmacy or Health Dept. Verbalized acceptance and understanding.  Screening Tests Health Maintenance  Topic Date Due   Zoster Vaccines- Shingrix (1 of 2) Never done   COVID-19 Vaccine (3 - Moderna risk series) 04/07/2020   INFLUENZA VACCINE  07/10/2021   TETANUS/TDAP  10/16/2025   DEXA SCAN  Completed   PNA vac Low Risk Adult  Completed   HPV VACCINES  Aged Out    Health Maintenance  Health Maintenance Due  Topic Date Due   Zoster Vaccines- Shingrix (1 of 2) Never done   COVID-19 Vaccine (3 - Moderna risk series) 04/07/2020    Colorectal cancer screening: No longer required.   Mammogram status: No longer required due to age.  Bone Density status: Completed 06/09/2008.   Lung Cancer Screening: (Low Dose CT Chest recommended if Age 29-80 years, 30 pack-year currently smoking OR have quit w/in 15years.) does not qualify.   Lung Cancer Screening Referral: no  Additional Screening:  Hepatitis C Screening: does not qualify;   Vision Screening: Recommended annual ophthalmology exams for early detection of glaucoma and other disorders of the eye. Is the patient up to date with their annual eye exam?  No  Who is the  provider or what is the name of the office in which the patient attends annual eye exams? [facility] If pt is not established with a provider, would they like to be referred to a provider to establish care? No .   Dental Screening: Recommended annual dental exams for proper oral hygiene  Community Resource Referral / Chronic Care Management: CRR required this visit?  No   CCM required this visit?  No      Plan:     I have personally reviewed and noted the following in the patient's chart:   Medical and social history Use of alcohol, tobacco or illicit drugs  Current medications and supplements including opioid prescriptions.  Functional ability and status Nutritional status Physical activity Advanced directives List of other physicians Hospitalizations, surgeries, and ER visits in previous 12 months Vitals Screenings to include cognitive, depression, and falls Referrals and appointments  In addition, I have reviewed and discussed with patient certain preventive protocols, quality metrics, and best practice recommendations. A written personalized care plan for preventive services as well as general preventive health recommendations were provided to patient.     Kellie Simmering, LPN   [id number]   Nurse Notes:

## 2021-06-28 ENCOUNTER — Ambulatory Visit: Payer: PPO | Admitting: Cardiovascular Disease

## 2021-07-10 ENCOUNTER — Other Ambulatory Visit: Payer: Self-pay

## 2021-07-10 DIAGNOSIS — M5416 Radiculopathy, lumbar region: Secondary | ICD-10-CM

## 2021-07-10 DIAGNOSIS — M5136 Other intervertebral disc degeneration, lumbar region: Secondary | ICD-10-CM

## 2021-07-10 DIAGNOSIS — M47817 Spondylosis without myelopathy or radiculopathy, lumbosacral region: Secondary | ICD-10-CM

## 2021-07-10 MED ORDER — GABAPENTIN 100 MG PO CAPS: 100.0000 mg | ORAL_CAPSULE | Freq: Three times a day (TID) | ORAL | 0 refills | Status: DC

## 2021-07-25 ENCOUNTER — Encounter: Payer: Self-pay | Admitting: Family Medicine

## 2021-07-25 ENCOUNTER — Other Ambulatory Visit: Payer: Self-pay | Admitting: Family Medicine

## 2021-07-25 ENCOUNTER — Other Ambulatory Visit: Payer: Self-pay

## 2021-07-25 ENCOUNTER — Ambulatory Visit (INDEPENDENT_AMBULATORY_CARE_PROVIDER_SITE_OTHER): Payer: PPO | Admitting: Family Medicine

## 2021-07-25 DIAGNOSIS — M5136 Other intervertebral disc degeneration, lumbar region: Secondary | ICD-10-CM

## 2021-07-25 DIAGNOSIS — M5416 Radiculopathy, lumbar region: Secondary | ICD-10-CM | POA: Diagnosis not present

## 2021-07-25 DIAGNOSIS — N3281 Overactive bladder: Secondary | ICD-10-CM

## 2021-07-25 DIAGNOSIS — M47817 Spondylosis without myelopathy or radiculopathy, lumbosacral region: Secondary | ICD-10-CM

## 2021-07-25 DIAGNOSIS — E034 Atrophy of thyroid (acquired): Secondary | ICD-10-CM | POA: Diagnosis not present

## 2021-07-25 DIAGNOSIS — E7800 Pure hypercholesterolemia, unspecified: Secondary | ICD-10-CM

## 2021-07-25 DIAGNOSIS — M48062 Spinal stenosis, lumbar region with neurogenic claudication: Secondary | ICD-10-CM

## 2021-07-25 DIAGNOSIS — M545 Low back pain, unspecified: Secondary | ICD-10-CM

## 2021-07-25 MED ORDER — LEVOTHYROXINE SODIUM 25 MCG PO TABS: 25.0000 ug | ORAL_TABLET | Freq: Every day | ORAL | 3 refills | Status: AC

## 2021-07-25 MED ORDER — ROSUVASTATIN CALCIUM 10 MG PO TABS: 10.0000 mg | ORAL_TABLET | Freq: Every day | ORAL | 3 refills | Status: AC

## 2021-07-25 MED ORDER — MIRABEGRON ER 25 MG PO TB24: 25.0000 mg | ORAL_TABLET | Freq: Every day | ORAL | 5 refills | Status: AC

## 2021-07-25 MED ORDER — GABAPENTIN 100 MG PO CAPS: 100.0000 mg | ORAL_CAPSULE | Freq: Three times a day (TID) | ORAL | 3 refills | Status: AC

## 2021-07-25 NOTE — Telephone Encounter (Signed)
Requested medication (s) are due for refill today: yes  Requested medication (s) are on the active medication list: yes  Last refill:  05/15/21 #180  Future visit scheduled:yes  Notes to clinic:  med not delegated to NT to RF   Requested Prescriptions  Pending Prescriptions Disp Refills   traMADol (ULTRAM) 50 MG tablet [Pharmacy Med Name: TRAMADOL HCL 50 MG TABLET] 180 tablet 0    Sig: Take 1-2 tablets (50-100 mg total) by mouth every 6 (six) hours as needed for moderate pain. Take up to max 6 pills per 24 hours.     Not Delegated - Analgesics:  Opioid Agonists Failed - 07/25/2021  9:29 AM      Failed - This refill cannot be delegated      Failed - Urine Drug Screen completed in last 360 days      Passed - Valid encounter within last 6 months    Recent Outpatient Visits           4 months ago Acute cystitis with hematuria   [place], DO   5 months ago Acute cystitis without hematuria   [place], DO   7 months ago No-show for appointment   [facility] Olin Hauser, DO   10 months ago Tremors of nervous system   [place], DO   1 year ago Chronic midline low back pain without sciatica   [place], Devonne Doughty, DO       Future Appointments             Today Parks [place] Devonne Doughty, [facility], [place]   In 1 month Gollan, Kathlene November, MD [facility], [place]   In 11 months  [facility], [facility]

## 2021-07-25 NOTE — Patient Instructions (Addendum)
Thank you for coming to the office today.  Likely side effect from Tramadol with drawal, restart today, and ordered additional 2 refills, for up to 3 months.  You can refill Metoprolol anytime it has refills  Please schedule a Follow-up Appointment to: Return in about 3 months (around 10/25/2021) for 3 month follow-up Med Refills/ Chronic Pain.  If you have any other questions or concerns, please feel free to call the office or send a message through [place]. You may also schedule an earlier appointment if necessary.  Additionally, you may be receiving a survey about your experience at our office within a few days to 1 week by e-mail or mail. We value your feedback.  Nobie Putnam, DO [place]

## 2021-07-25 NOTE — Progress Notes (Signed)
Subjective:    Patient ID: Jennifer Skinner, female    DOB: May 09, 1931, 87 y.o.   MRN: [id number]  Jennifer Skinner is a 87 y.o. female presenting on 07/25/2021 for Tremors   HPI   Trembling / Tremors, internal Reports feeling "off" similar to prior times when ran out of Tramadol withdrawal out of med today, she called in request 1 hour ago, taking 5-6 pills per day lasting 180 pills up to 30 days - It does help her back pain and allow her to function. Improves mobility  OAB Currently on Myrbetriq, some results. Needs refill  HLD Tolerating low dose Rosuvastatin '10mg'$  No side effects Needs refill  Hypothyroidism Continues on very low dose levothyroxine 67mg Reviewed outside lab.  [facility] 06/20/2021 03/15/2020 12/08/2019  Decreased Interest 0 0 0  Down, Depressed, Hopeless 0 0 0  PHQ - 2 Score 0 0 0  Altered sleeping - - -  Tired, decreased energy - - -  Change in appetite - - -  Feeling bad or failure about yourself  - - -  Trouble concentrating - - -  Moving slowly or fidgety/restless - - -  Suicidal thoughts - - -  PHQ-9 Score - - -  Difficult doing work/chores - - -    Social History   Tobacco Use   Smoking status: Never   Smokeless tobacco: Never  Vaping Use   Vaping Use: Never used  Substance Use Topics   Alcohol use: No    Alcohol/week: 0.0 standard drinks   Drug use: No    Review of Systems Per HPI unless specifically indicated above     Objective:    BP (!) 122/46   Pulse 83   Ht '4\' 11"'$  (1.499 m)   Wt 76 lb 12.8 oz (34.8 kg)   SpO2 94%   BMI 15.51 kg/m   Wt Readings from Last 3 Encounters:  07/25/21 76 lb 12.8 oz (34.8 kg)  06/20/21 77 lb (34.9 kg)  03/03/21 78 lb (35.4 kg)    Physical Exam Vitals and nursing note reviewed.  Constitutional:      General: She is not in acute distress.    Appearance: Normal appearance. She is well-developed. She is not diaphoretic.     Comments: Thin cachectic 87year old female  comfortable, cooperative  HENT:     Head: Normocephalic and atraumatic.  Eyes:     General:        Right eye: No discharge.        Left eye: No discharge.     Conjunctiva/sclera: Conjunctivae normal.  Cardiovascular:     Rate and Rhythm: Normal rate.  Pulmonary:     Effort: Pulmonary effort is normal.  Musculoskeletal:     Comments: Chronic curvature of spine with exaggerated thoracic kyphosis  Skin:    General: Skin is warm and dry.     Findings: No erythema or rash.  Neurological:     Mental Status: She is alert and oriented to person, place, and time.  Psychiatric:        Mood and Affect: Mood normal.        Behavior: Behavior normal.        Thought Content: Thought content normal.     Comments: Well groomed, good eye contact, normal speech and thoughts       Results for orders placed or performed in visit on 09/20/20  CBC with Differential/Platelet  Result Value Ref Range   WBC  4.4 3.8 - 10.8 Thousand/uL   RBC 3.98 3.80 - 5.10 Million/uL   Hemoglobin 13.4 11.7 - 15.5 g/dL   HCT 40.1 35.0 - 45.0 %   MCV 100.8 (H) 80.0 - 100.0 fL   MCH 33.7 (H) 27.0 - 33.0 pg   MCHC 33.4 32.0 - 36.0 g/dL   RDW 11.9 11.0 - 15.0 %   Platelets 257 140 - 400 Thousand/uL   MPV 10.0 7.5 - 12.5 fL   Neutro Abs 3,102 1,500 - 7,800 cells/uL   Lymphs Abs 713 (L) 850 - 3,900 cells/uL   Absolute Monocytes 524 200 - 950 cells/uL   Eosinophils Absolute 22 15 - 500 cells/uL   Basophils Absolute 40 0 - 200 cells/uL   Neutrophils Relative % 70.5 %   Total Lymphocyte 16.2 %   Monocytes Relative 11.9 %   Eosinophils Relative 0.5 %   Basophils Relative 0.9 %  COMPLETE METABOLIC PANEL WITH GFR  Result Value Ref Range   Glucose, Bld 77 65 - 99 mg/dL   BUN 21 7 - 25 mg/dL   Creat 0.67 0.60 - 0.88 mg/dL   GFR, Est Non African American 79 > OR = 60 mL/min/1.50m   GFR, Est African American 92 > OR = 60 mL/min/1.752m  BUN/Creatinine Ratio NOT APPLICABLE 6 - 22 (calc)   Sodium 141 135 - 146 mmol/L    Potassium 4.6 3.5 - 5.3 mmol/L   Chloride 101 98 - 110 mmol/L   CO2 29 20 - 32 mmol/L   Calcium 9.5 8.6 - 10.4 mg/dL   Total Protein 7.1 6.1 - 8.1 g/dL   Albumin 3.9 3.6 - 5.1 g/dL   Globulin 3.2 1.9 - 3.7 g/dL (calc)   AG Ratio 1.2 1.0 - 2.5 (calc)   Total Bilirubin 0.4 0.2 - 1.2 mg/dL   Alkaline phosphatase (APISO) 74 37 - 153 U/L   AST 79 (H) 10 - 35 U/L   ALT 82 (H) 6 - 29 U/L      Assessment & Plan:   Problem List Items Addressed This Visit     OAB (overactive bladder)   Relevant Medications   mirabegron ER (MYRBETRIQ) 25 MG TB24 tablet   Lumbar radiculitis   Relevant Medications   gabapentin (NEURONTIN) 100 MG capsule   Lumbar and sacral osteoarthritis   Relevant Medications   gabapentin (NEURONTIN) 100 MG capsule   Hypothyroidism   Relevant Medications   levothyroxine (SYNTHROID) 25 MCG tablet   Hyperlipidemia   Relevant Medications   rosuvastatin (CRESTOR) 10 MG tablet   Degeneration of intervertebral disc of lumbar region   Relevant Medications   gabapentin (NEURONTIN) 100 MG capsule    Chronoic Back Pain Lumbar OA/DJD Radicular symptoms Continue on current Tramadol therapy - likely with withdrawal symptoms today out of med, will re order, seems to improve her function overall. Tolerating well without complication  Hypothyroidism Due for refill low dose Levothyroxine 2535mdaily  HLD Controlled on Rosuvastatin '10mg'$  refill today  OAB Improved on Myrbetriq will re order.  Meds ordered this encounter  Medications   gabapentin (NEURONTIN) 100 MG capsule    Sig: Take 1 capsule (100 mg total) by mouth 3 (three) times daily.    Dispense:  270 capsule    Refill:  3   levothyroxine (SYNTHROID) 25 MCG tablet    Sig: Take 1 tablet (25 mcg total) by mouth daily before breakfast.    Dispense:  90 tablet    Refill:  3   mirabegron  ER (MYRBETRIQ) 25 MG TB24 tablet    Sig: Take 1 tablet (25 mg total) by mouth daily.    Dispense:  30 tablet    Refill:  5    rosuvastatin (CRESTOR) 10 MG tablet    Sig: Take 1 tablet (10 mg total) by mouth at bedtime.    Dispense:  90 tablet    Refill:  3     Follow up plan: Return in about 3 months (around 10/25/2021) for 3 month follow-up Med Refills/ Chronic Pain.   Nobie Putnam, [facility] 07/25/2021, 10:56 AM

## 2021-07-26 ENCOUNTER — Other Ambulatory Visit: Payer: Self-pay | Admitting: Family Medicine

## 2021-07-26 DIAGNOSIS — I272 Pulmonary hypertension, unspecified: Secondary | ICD-10-CM

## 2021-07-26 NOTE — Telephone Encounter (Signed)
Future OV 11/06/21. Approved per protocol. Requested Prescriptions  Pending Prescriptions Disp Refills  . sildenafil (REVATIO) 20 MG tablet [Pharmacy Med Name: SILDENAFIL 20 MG TABLET] 90 tablet 0    Sig: Take 1 tablet (20 mg total) by mouth 3 (three) times daily. For pulmonary hypertension     Urology: Erectile Dysfunction Agents Failed - 07/26/2021  3:09 PM      Failed - Last BP in normal range    BP Readings from Last 1 Encounters:  07/25/21 (!) 122/46         Passed - Valid encounter within last 12 months    Recent Outpatient Visits          Yesterday Lumbar and sacral osteoarthritis   [place], DO   4 months ago Acute cystitis with hematuria   [place], DO   5 months ago Acute cystitis without hematuria   [place], DO   7 months ago No-show for appointment   [facility] Olin Hauser, DO   10 months ago Tremors of nervous system   [place], DO      Future Appointments            In 1 month Gollan, Kathlene November, MD [facility], [place]   In 3 months Parks [place], Devonne Doughty, [facility], [place]   In 11 months  [facility], [place]

## 2021-08-30 ENCOUNTER — Other Ambulatory Visit: Payer: Self-pay

## 2021-08-30 ENCOUNTER — Ambulatory Visit (INDEPENDENT_AMBULATORY_CARE_PROVIDER_SITE_OTHER): Payer: PPO | Admitting: Internal Medicine

## 2021-08-30 ENCOUNTER — Encounter: Payer: Self-pay | Admitting: Internal Medicine

## 2021-08-30 ENCOUNTER — Ambulatory Visit
Admission: RE | Admit: 2021-08-30 | Discharge: 2021-08-30 | Disposition: A | Discharge status: Home / Self Care | Payer: PPO | Attending: Internal Medicine | Admitting: Internal Medicine

## 2021-08-30 ENCOUNTER — Ambulatory Visit
Admission: RE | Admit: 2021-08-30 | Discharge: 2021-08-30 | Disposition: A | Discharge status: Home / Self Care | Payer: PPO | Source: Ambulatory Visit | Attending: Internal Medicine | Admitting: Internal Medicine

## 2021-08-30 VITALS — BP 118/46 | HR 88 | Temp 97.5°F | Ht 59.0 in | Wt 76.4 lb

## 2021-08-30 DIAGNOSIS — K623 Rectal prolapse: Secondary | ICD-10-CM

## 2021-08-30 DIAGNOSIS — K5900 Constipation, unspecified: Secondary | ICD-10-CM | POA: Diagnosis not present

## 2021-08-30 DIAGNOSIS — K5901 Slow transit constipation: Secondary | ICD-10-CM | POA: Insufficient documentation

## 2021-08-30 DIAGNOSIS — R140 Abdominal distension (gaseous): Secondary | ICD-10-CM | POA: Diagnosis not present

## 2021-08-30 DIAGNOSIS — Z23 Encounter for immunization: Secondary | ICD-10-CM | POA: Diagnosis not present

## 2021-08-30 NOTE — Progress Notes (Signed)
Subjective:    Patient ID: Jennifer Skinner, female    DOB: Apr 17, 1931, 87 y.o.   MRN: [id number]  HPI  Pt presents to the clinic today with c/o rectal pain due to constipation. She reports this started 5 days ago. She has been having some liquid stool leaking around what she feels like is impacted stool in her rectum. She denies abdominal pain, nausea, vomiting or blood in her stool. She has been using Vaseline but this has not seemed to help. She is prescribed Mirilax but admits the she does not take this. She does take Milk of Magnesium but reports this is for reflux, not to have a BM.  Review of Systems     Past Medical History:  Diagnosis Date   1st degree AV block    Arthritis    Chronic diastolic CHF (congestive heart failure) ([place])    a. echo 2014: EF 55-60%, nl wall motion, GR1DD, mild MR, moder mitral prolapse, PASP 40 mm Hg; b. echo 09/2015: EF 60-65%, nl wall motion, GR1DD, mod MR, mild to mod TR, PASP 56 mm Hg   COPD (chronic obstructive pulmonary disease) (HCC)    Degenerative disc disease, lumbar    Gastro-esophageal reflux    Hyperlipidemia    Mitral prolapse    Mitral regurgitation    Osteoporosis     Current Outpatient Medications  Medication Sig Dispense Refill   acetaminophen (TYLENOL) 500 MG tablet Take 500 mg by mouth every 6 (six) hours as needed.     albuterol (PROVENTIL) (2.5 MG/3ML) 0.083% nebulizer solution Take 3 mLs (2.5 mg total) by nebulization every 6 (six) hours as needed for wheezing or shortness of breath. 75 mL 12   aspirin EC 81 MG tablet Take 1 tablet (81 mg total) by mouth daily. 90 tablet 3   baclofen (LIORESAL) 10 MG tablet Take 0.5-1 tablets (5-10 mg total) by mouth 3 (three) times daily as needed for muscle spasms. 30 each 2   Calcium Gluconate 500 MG CAPS Take 2 capsules by mouth daily.      gabapentin (NEURONTIN) 100 MG capsule Take 1 capsule (100 mg total) by mouth 3 (three) times daily. 270 capsule 3   levothyroxine (SYNTHROID) 25 MCG  tablet Take 1 tablet (25 mcg total) by mouth daily before breakfast. 90 tablet 3   metoprolol tartrate (LOPRESSOR) 25 MG tablet Take 0.5 tablets (12.5 mg total) by mouth 2 (two) times daily. 90 tablet 3   mirabegron ER (MYRBETRIQ) 25 MG TB24 tablet Take 1 tablet (25 mg total) by mouth daily. 30 tablet 5   Multiple Vitamin (MULTI-VITAMIN DAILY PO) Take 1 tablet by mouth daily.      Oyster Shell (OYSTER CALCIUM) 500 MG TABS Take 500 mg of elemental calcium by mouth 2 (two) times daily.     polyethylene glycol powder (GLYCOLAX/MIRALAX) powder Take 17-34 g by mouth daily as needed. 250 g 2   potassium chloride (KLOR-CON) 10 MEQ tablet Take 1 tablet (10 mEq total) by mouth 3 (three) times daily. 270 tablet 0   rosuvastatin (CRESTOR) 10 MG tablet Take 1 tablet (10 mg total) by mouth at bedtime. 90 tablet 3   sildenafil (REVATIO) 20 MG tablet Take 1 tablet (20 mg total) by mouth 3 (three) times daily. For pulmonary hypertension 90 tablet 0   torsemide (DEMADEX) 20 MG tablet Take 2 tablets (40 mg total) by mouth 2 (two) times daily. 120 tablet 2   traMADol (ULTRAM) 50 MG tablet Take 1-2 tablets (50-100  mg total) by mouth every 6 (six) hours as needed for moderate pain. Take up to max 6 pills per 24 hours. 180 tablet 2   No current facility-administered medications for this visit.    Allergies  Allergen Reactions   Penicillins     Rash     Family History  Problem Relation Age of Onset   CVA Mother 33   Heart attack Father 61   Throat cancer Sister    CAD Brother     Social History   Socioeconomic History   Marital status: Married    Spouse name: Not on file   Number of children: Not on file   Years of education: Not on file   Highest education level: Not on file  Occupational History   Not on file  Tobacco Use   Smoking status: Never   Smokeless tobacco: Never  Vaping Use   Vaping Use: Never used  Substance and Sexual Activity   Alcohol use: No    Alcohol/week: 0.0 standard  drinks   Drug use: No   Sexual activity: Not Currently  Other Topics Concern   Not on file  Social History Narrative   Not on file   Social Determinants of Health   Financial Resource Strain: Low Risk    Difficulty of Paying Living Expenses: Not hard at all  Food Insecurity: No Food Insecurity   Worried About Charity fundraiser in the Last Year: Never true   [place] in the Last Year: Never true  Transportation Needs: No Transportation Needs   Lack of Transportation (Medical): No   Lack of Transportation (Non-Medical): No  Physical Activity: Inactive   Days of Exercise per Week: 0 days   Minutes of Exercise per Session: 0 min  Stress: No Stress Concern Present   Feeling of Stress : Not at all  Social Connections: Not on file  Intimate Partner Violence: Not on file     Constitutional: Denies fever, malaise, fatigue, headache or abrupt weight changes.  Respiratory: Denies difficulty breathing, shortness of breath, cough or sputum production.   Cardiovascular: Denies chest pain, chest tightness, palpitations or swelling in the hands or feet.  Gastrointestinal: Pt reports rectal pain, constipation. Denies abdominal pain, bloating, diarrhea or blood in the stool.   No other specific complaints in a complete review of systems (except as listed in HPI above).  Objective:   Physical Exam   BP (!) 118/46 (BP Location: Right Arm, Patient Position: Sitting, Cuff Size: Small)   Pulse 88   Temp (!) 97.5 F (36.4 C) (Temporal)   Ht 4\' 11"  (1.499 m)   Wt 76 lb 6.4 oz (34.7 kg)   SpO2 97%   BMI 15.43 kg/m   Wt Readings from Last 3 Encounters:  07/25/21 76 lb 12.8 oz (34.8 kg)  06/20/21 77 lb (34.9 kg)  03/03/21 78 lb (35.4 kg)    General: Appears her stated age, chronically ill appearing, in NAD. Skin: Warm, dry and intact.  Cardiovascular: Normal rate and rhythm.  Pulmonary/Chest: Normal effort and positive vesicular breath sounds. No respiratory distress. No  wheezes, rales or ronchi noted.  Abdomen: Distended, but nontender. Hyperactive bowel sounds. Rectal: Mild rectal prolapse noted. Soft stool noted in the rectal vault, no evidence of impaction. Hemoccult negative stool. Musculoskeletal: Gait slow, unsteady without device. Neurological: Alert and oriented.  BMET    Component Value Date/Time   NA 141 09/20/2020 1151   NA 141 08/18/2018 1144   NA  138 09/12/2013 2205   K 4.6 09/20/2020 1151   K 4.0 09/12/2013 2205   CL 101 09/20/2020 1151   CL 104 09/12/2013 2205   CO2 29 09/20/2020 1151   CO2 27 09/12/2013 2205   GLUCOSE 77 09/20/2020 1151   GLUCOSE 80 09/12/2013 2205   BUN 21 09/20/2020 1151   BUN 27 08/18/2018 1144   BUN 15 09/12/2013 2205   CREATININE 0.67 09/20/2020 1151   CALCIUM 9.5 09/20/2020 1151   CALCIUM 9.0 09/12/2013 2205   GFRNONAA 79 09/20/2020 1151   GFRAA 92 09/20/2020 1151    Lipid Panel     Component Value Date/Time   CHOL 129 08/26/2019 1012   TRIG 56 08/26/2019 1012   HDL 51 08/26/2019 1012   CHOLHDL 2.5 08/26/2019 1012   VLDL 22 01/07/2018 2105   LDLCALC 64 08/26/2019 1012    CBC    Component Value Date/Time   WBC 4.4 09/20/2020 1151   RBC 3.98 09/20/2020 1151   HGB 13.4 09/20/2020 1151   HGB 13.5 09/12/2013 2205   HCT 40.1 09/20/2020 1151   HCT 38.5 09/12/2013 2205   PLT 257 09/20/2020 1151   PLT 243 09/12/2013 2205   MCV 100.8 (H) 09/20/2020 1151   MCV 95 09/12/2013 2205   MCH 33.7 (H) 09/20/2020 1151   MCHC 33.4 09/20/2020 1151   RDW 11.9 09/20/2020 1151   RDW 13.2 09/12/2013 2205   LYMPHSABS 713 (L) 09/20/2020 1151   LYMPHSABS 1.3 09/12/2013 2205   MONOABS 0.8 01/07/2018 1042   MONOABS 1.0 (H) 09/12/2013 2205   EOSABS 22 09/20/2020 1151   EOSABS 0.1 09/12/2013 2205   BASOSABS 40 09/20/2020 1151   BASOSABS 0.1 09/12/2013 2205    Hgb A1C Lab Results  Component Value Date   HGBA1C 5.6 08/26/2019           Assessment & Plan:   Abdominal Distention, Constipation, Rectal  Prolapse:  KUB today She is refusing an enema Start Mirilax 17 gm PO 2 x day until having normal BM Use Dulcolax suppository at bedtime until having normal BM Encouraged adequate water intake and high fiber diet  Will follow up after xray, return precautions discussed Webb Silversmith, NP This visit occurred during the SARS-CoV-2 public health emergency.  Safety protocols were in place, including screening questions prior to the visit, additional usage of staff PPE, and extensive cleaning of exam room while observing appropriate contact time as indicated for disinfecting solutions.

## 2021-08-30 NOTE — Addendum Note (Signed)
Addended by: Wilson Singer on: 08/30/2021 10:01 AM   Modules accepted: Orders

## 2021-08-30 NOTE — Patient Instructions (Signed)

## 2021-08-31 ENCOUNTER — Telehealth: Payer: Self-pay

## 2021-08-31 NOTE — Telephone Encounter (Signed)
Copied from [place] [id number]. Topic: General - Other >> Aug 31, 2021  8:13 AM Alanda Slim E wrote: Reason for CRM: Pt asked if Rollene Fare can call her back today / she stated it was urgent / she stated she is doing what Rollene Fare advised about her bowels / but nothing is working Lawyer advise

## 2021-08-31 NOTE — Telephone Encounter (Signed)
The pt called back complaining of rectal pain and pressure. She said she have increase her fluid intake and took the mirilax with very little relief. She complain that it is impacted and the only way she can get it out is to dig it out. The patient state she will be willing to try a enema now. I informed her that she can get a fleet enema from the drug store, but be very careful with placement. She is concern about the pain it might cause. I recommended lubricating it with Vaseline. Please advise

## 2021-09-01 NOTE — Telephone Encounter (Signed)
She had soft stool in the rectal vault. Was she taking the Dulcolax with the Mirilax as recommended? Agree with Fleets enema which she declined at the office visit. If no improvement would try Mag Citrate or go to ER for eval.

## 2021-09-01 NOTE — Telephone Encounter (Signed)
The pt was notified of these recommendations on yesterday.

## 2021-09-05 ENCOUNTER — Other Ambulatory Visit: Payer: Self-pay | Admitting: Family Medicine

## 2021-09-05 DIAGNOSIS — I272 Pulmonary hypertension, unspecified: Secondary | ICD-10-CM

## 2021-09-12 ENCOUNTER — Ambulatory Visit: Payer: PPO | Admitting: Cardiovascular Disease

## 2021-09-18 ENCOUNTER — Other Ambulatory Visit: Payer: Self-pay | Admitting: Family Medicine

## 2021-09-18 DIAGNOSIS — I890 Lymphedema, not elsewhere classified: Secondary | ICD-10-CM

## 2021-09-18 DIAGNOSIS — I5032 Chronic diastolic (congestive) heart failure: Secondary | ICD-10-CM

## 2021-09-19 NOTE — Telephone Encounter (Signed)
Requested medications are due for refill today yes  Requested medications are on the active medication list yes  Last refill 08/17/21  Last visit 08/30/21  Future visit scheduled 11/06/21  Notes to clinic Failed protocol due to no valid labs within 360 days, please assess.

## 2021-09-21 ENCOUNTER — Other Ambulatory Visit: Payer: Self-pay

## 2021-09-21 ENCOUNTER — Ambulatory Visit (INDEPENDENT_AMBULATORY_CARE_PROVIDER_SITE_OTHER): Payer: PPO | Admitting: Family Medicine

## 2021-09-21 ENCOUNTER — Encounter: Payer: Self-pay | Admitting: Family Medicine

## 2021-09-21 VITALS — Ht 59.0 in | Wt 76.0 lb

## 2021-09-21 DIAGNOSIS — K5901 Slow transit constipation: Secondary | ICD-10-CM | POA: Diagnosis not present

## 2021-09-21 NOTE — Progress Notes (Signed)
Virtual Visit via Telephone The purpose of this virtual visit is to provide medical care while limiting exposure to the novel coronavirus (COVID19) for both patient and office staff.  Consent was obtained for phone visit:  Yes.   Answered questions that patient had about telehealth interaction:  Yes.   I discussed the limitations, risks, security and privacy concerns of performing an evaluation and management service by telephone. I also discussed with the patient that there may be a patient responsible charge related to this service. The patient expressed understanding and agreed to proceed.  Patient Location: Home Provider Location: [facility] (Office)  Participants in virtual visit: - Patient: Jennifer Skinner - CMA: Orinda Kenner, [place] - Provider: Dr Parks Ranger  ---------------------------------------------------------------------- Chief Complaint  Patient presents with   Constipation    S: Reviewed CMA documentation. I have called patient and gathered additional HPI as follows:  Constipation Reports that symptoms started >1 month ago, she has had visit here at [facility] for constipation, had X-ray showed large stool, non obstructed. She was given instructions for Miralax dosing and suppository if needed. She had success with BMs but then too often, stopped miralax and takes sometimes now. She wears diaper and has some soiling Changing her diet to help  Denies any fevers, chills, sweats, body ache, cough, shortness of breath, sinus pain or pressure, headache, abdominal pain, diarrhea, dark stool or blood in stool or abdominal pain  Past Medical History:  Diagnosis Date   1st degree AV block    Arthritis    Chronic diastolic CHF (congestive heart failure) ([place])    a. echo 2014: EF 55-60%, nl wall motion, GR1DD, mild MR, moder mitral prolapse, PASP 40 mm Hg; b. echo 09/2015: EF 60-65%, nl wall motion, GR1DD, mod MR, mild to mod TR, PASP 56 mm Hg   COPD (chronic  obstructive pulmonary disease) (HCC)    Degenerative disc disease, lumbar    Gastro-esophageal reflux    Hyperlipidemia    Mitral prolapse    Mitral regurgitation    Osteoporosis    Social History   Tobacco Use   Smoking status: Never   Smokeless tobacco: Never  Vaping Use   Vaping Use: Never used  Substance Use Topics   Alcohol use: No    Alcohol/week: 0.0 standard drinks   Drug use: No    Current Outpatient Medications:    acetaminophen (TYLENOL) 500 MG tablet, Take 500 mg by mouth every 6 (six) hours as needed., Disp: , Rfl:    aspirin EC 81 MG tablet, Take 1 tablet (81 mg total) by mouth daily., Disp: 90 tablet, Rfl: 3   Calcium Gluconate 500 MG CAPS, Take 2 capsules by mouth daily. , Disp: , Rfl:    gabapentin (NEURONTIN) 100 MG capsule, Take 1 capsule (100 mg total) by mouth 3 (three) times daily., Disp: 270 capsule, Rfl: 3   levothyroxine (SYNTHROID) 25 MCG tablet, Take 1 tablet (25 mcg total) by mouth daily before breakfast., Disp: 90 tablet, Rfl: 3   metoprolol tartrate (LOPRESSOR) 25 MG tablet, Take 0.5 tablets (12.5 mg total) by mouth 2 (two) times daily., Disp: 90 tablet, Rfl: 3   mirabegron ER (MYRBETRIQ) 25 MG TB24 tablet, Take 1 tablet (25 mg total) by mouth daily., Disp: 30 tablet, Rfl: 5   Oyster Shell (OYSTER CALCIUM) 500 MG TABS, Take 500 mg of elemental calcium by mouth 2 (two) times daily., Disp: , Rfl:    potassium chloride (KLOR-CON) 10 MEQ tablet, Take 1  tablet (10 mEq total) by mouth 3 (three) times daily., Disp: 270 tablet, Rfl: 0   rosuvastatin (CRESTOR) 10 MG tablet, Take 1 tablet (10 mg total) by mouth at bedtime., Disp: 90 tablet, Rfl: 3   sildenafil (REVATIO) 20 MG tablet, Take 1 tablet (20 mg total) by mouth 3 (three) times daily. For pulmonary hypertension, Disp: 90 tablet, Rfl: 1   torsemide (DEMADEX) 20 MG tablet, Take 2 tablets (40 mg total) by mouth 2 (two) times daily., Disp: 120 tablet, Rfl: 0   traMADol (ULTRAM) 50 MG tablet, Take 1-2 tablets  (50-100 mg total) by mouth every 6 (six) hours as needed for moderate pain. Take up to max 6 pills per 24 hours., Disp: 180 tablet, Rfl: 2   Multiple Vitamin (MULTI-VITAMIN DAILY PO), Take 1 tablet by mouth daily.  (Patient not taking: No sig reported), Disp: , Rfl:    polyethylene glycol powder (GLYCOLAX/MIRALAX) powder, Take 17-34 g by mouth daily as needed. (Patient not taking: No sig reported), Disp: 250 g, Rfl: 2  Depression screen [facility] 2/9 06/20/2021 03/15/2020 12/08/2019  Decreased Interest 0 0 0  Down, Depressed, Hopeless 0 0 0  PHQ - 2 Score 0 0 0  Altered sleeping - - -  Tired, decreased energy - - -  Change in appetite - - -  Feeling bad or failure about yourself  - - -  Trouble concentrating - - -  Moving slowly or fidgety/restless - - -  Suicidal thoughts - - -  PHQ-9 Score - - -  Difficult doing work/chores - - -    GAD 7 : Generalized Anxiety Score 09/04/2016  Nervous, Anxious, on Edge 0  Control/stop worrying 0  Worry too much - different things 0  Trouble relaxing 0  Restless 0  Easily annoyed or irritable 0  Afraid - awful might happen 0  Total GAD 7 Score 0  Anxiety Difficulty Not difficult at all    -------------------------------------------------------------------------- O: No physical exam performed due to remote telephone encounter.  Lab results reviewed.  No results found for this or any previous visit (from the past 2160 hour(s)).  DG Abd 1 ViewPerformed 08/30/2021 Final result  Study Result CLINICAL DATA: Constipation  EXAM: ABDOMEN - 1 VIEW  COMPARISON: 07/12/2020  FINDINGS: Nonobstructed gas pattern with large volume of stool throughout the colon and rectum. Calcified phleboliths left pelvis. Ovoid opacities within the central and left mid abdomen possible pill fragments. Scoliosis. Aortic atherosclerosis.  IMPRESSION: Nonobstructed bowel-gas pattern with large quantity of stool in the colon and rectum   Electronically Signed By: Donavan Foil M.D. On: 08/30/2021 22:51  -------------------------------------------------------------------------- A&P:     Problem List Items Addressed This Visit   None Visit Diagnoses     Slow transit constipation    -  Primary       Clinically with constipation with episodic loose stool fecal soiling after laxative use She was on regimen with miralax and suppository and would have loose stools Discussed limitations today with options, she has known constipation and large stool on last recent x-ray, but medications cause her to have loose stools We discussed intermittent dosing Miralax every 1-3 days, may need intermittent rather than continuous or higher dose She can use Imodium PRN with CAUTION only temporarily once in a while if needed due to persistent loose stool, should not take too often risk of constipation worse   No orders of the defined types were placed in this encounter.   Follow-up:  Patient verbalizes understanding  with the above medical recommendations including the limitation of remote medical advice.  Specific follow-up and call-back criteria were given for patient to follow-up or seek medical care more urgently if needed.   - Time spent in direct consultation with patient on phone: 8 minutes   Nobie Putnam, [facility] 09/21/2021, 11:54 AM

## 2021-09-21 NOTE — Patient Instructions (Signed)
° °  Please schedule a Follow-up Appointment to: No follow-ups on file. ° °If you have any other questions or concerns, please feel free to call the office or send a message through MyChart. You may also schedule an earlier appointment if necessary. ° °Additionally, you may be receiving a survey about your experience at our office within a few days to 1 week by e-mail or mail. We value your feedback. ° °Careli Luzader, DO °[facility] °

## 2021-10-03 NOTE — Progress Notes (Signed)
Cardiology Office Note    Date:  10/04/2021   ID:  Jennifer Skinner, DOB 1931-04-03, MRN [id number]  PCP:  Olin Hauser, DO  Cardiologist:  Ida Rogue, MD  Electrophysiologist:  None   Chief Complaint: Follow-up  History of Present Illness:   Jennifer Skinner is a 87 y.o. female with history of HFpEF, pulmonary hypertension, COPD, mitral valve prolapse with regurgitation, HTN, HLD, and chronic back pain with gait instability who presents for follow-up of HFpEF, pulmonary hypertension, and mitral valve disease.  Nuclear stress test from 10/2017 showed no significant ischemia and was overall low risk.  Most recent echo from 12/2017 demonstrated an EF of 55 to 60%, no regional wall motion abnormalities, mild concentric LVH, grade 1 diastolic dysfunction, moderate to severe mitral regurgitation directed eccentrically, moderately dilated left atrium, moderately dilated right ventricle with normal wall thickness and moderately to severely reduced RV systolic function, moderately dilated right atrium, moderate tricuspid regurgitation, and severely elevated PASP estimated at 70 mmHg.  She was last seen in our office in 08/2018, at which time she was taking torsemide 80 mg twice daily, which was felt to be likely an error.  Torsemide dose was subsequently decreased to 40 mg twice daily by the [facility] CHF clinic the next day.  She was last seen by the [facility] CHF clinic in 09/2018.  She comes in today accompanied by her son and reports she is doing well from a cardiac perspective.  No chest pain, dyspnea, palpitations, dizziness, presyncope, or syncope.  Her main complaint at this time is back pain.  She has also noted some soft tissue swelling involving the left second digit.  Her weight is down 8 pounds today when compared to her last visit in 2019.  She reports a good appetite.  She is tolerating aspirin, metoprolol, Crestor, and torsemide.  She is not taking supplemental  potassium.  She does watch her salt intake and drinks less than 2 L of fluid per day.  She does not have any active cardiac issues or concerns at this time.   Labs independently reviewed: 09/2020 - BUN 21, SCr 0.67, potassium 4.6, albumin 3.9, AST 79, ALT 82, HGB 13.4, PLT 257 08/2019 - A1c 5.6, TSH normal, TC 129, TG 56, HDL 51, LDL 64  Past Medical History:  Diagnosis Date   1st degree AV block    Arthritis    Chronic diastolic CHF (congestive heart failure) ([place])    a. echo 2014: EF 55-60%, nl wall motion, GR1DD, mild MR, moder mitral prolapse, PASP 40 mm Hg; b. echo 09/2015: EF 60-65%, nl wall motion, GR1DD, mod MR, mild to mod TR, PASP 56 mm Hg   COPD (chronic obstructive pulmonary disease) (HCC)    Degenerative disc disease, lumbar    Gastro-esophageal reflux    Hyperlipidemia    Mitral prolapse    Mitral regurgitation    Osteoporosis     Past Surgical History:  Procedure Laterality Date   ABDOMINAL HYSTERECTOMY     APPENDECTOMY     DILATION AND CURETTAGE OF UTERUS     HYSTEROTOMY     right breast biopsy     right eye      surgery   TONSILLECTOMY      Current Medications: Current Meds  Medication Sig   acetaminophen (TYLENOL) 500 MG tablet Take 500 mg by mouth every 6 (six) hours as needed.   aspirin EC 81 MG tablet Take 1 tablet (81 mg total) by mouth daily.  Calcium Gluconate 500 MG CAPS Take 2 capsules by mouth daily.    gabapentin (NEURONTIN) 100 MG capsule Take 1 capsule (100 mg total) by mouth 3 (three) times daily.   levothyroxine (SYNTHROID) 25 MCG tablet Take 1 tablet (25 mcg total) by mouth daily before breakfast.   metoprolol tartrate (LOPRESSOR) 25 MG tablet Take 0.5 tablets (12.5 mg total) by mouth 2 (two) times daily.   mirabegron ER (MYRBETRIQ) 25 MG TB24 tablet Take 1 tablet (25 mg total) by mouth daily.   Multiple Vitamin (MULTI-VITAMIN DAILY PO) Take 1 tablet by mouth daily.   Oyster Shell (OYSTER CALCIUM) 500 MG TABS Take 500 mg of elemental  calcium by mouth 2 (two) times daily.   polyethylene glycol powder (GLYCOLAX/MIRALAX) powder Take 17-34 g by mouth daily as needed.   rosuvastatin (CRESTOR) 10 MG tablet Take 1 tablet (10 mg total) by mouth at bedtime.   sildenafil (REVATIO) 20 MG tablet Take 1 tablet (20 mg total) by mouth 3 (three) times daily. For pulmonary hypertension   torsemide (DEMADEX) 20 MG tablet Take 2 tablets (40 mg total) by mouth 2 (two) times daily.   traMADol (ULTRAM) 50 MG tablet Take 1-2 tablets (50-100 mg total) by mouth every 6 (six) hours as needed for moderate pain. Take up to max 6 pills per 24 hours.    Allergies:   Penicillins   Social History   Socioeconomic History   Marital status: Married    Spouse name: Not on file   Number of children: Not on file   Years of education: Not on file   Highest education level: Not on file  Occupational History   Not on file  Tobacco Use   Smoking status: Never   Smokeless tobacco: Never  Vaping Use   Vaping Use: Never used  Substance and Sexual Activity   Alcohol use: No    Alcohol/week: 0.0 standard drinks   Drug use: No   Sexual activity: Not Currently  Other Topics Concern   Not on file  Social History Narrative   Not on file   Social Determinants of Health   Financial Resource Strain: Low Risk    Difficulty of Paying Living Expenses: Not hard at all  Food Insecurity: No Food Insecurity   Worried About Charity fundraiser in the Last Year: Never true   [place] in the Last Year: Never true  Transportation Needs: No Transportation Needs   Lack of Transportation (Medical): No   Lack of Transportation (Non-Medical): No  Physical Activity: Inactive   Days of Exercise per Week: 0 days   Minutes of Exercise per Session: 0 min  Stress: No Stress Concern Present   Feeling of Stress : Not at all  Social Connections: Not on file     Family History:  The patient's family history includes CAD in her brother; CVA (age of onset: 67) in  her mother; Heart attack (age of onset: 21) in her father; Throat cancer in her sister.  ROS:   Review of Systems  Constitutional:  Positive for malaise/fatigue. Negative for chills, diaphoresis, fever and weight loss.  HENT:  Negative for congestion.   Eyes:  Negative for discharge and redness.  Respiratory:  Negative for cough, sputum production, shortness of breath and wheezing.   Cardiovascular:  Negative for chest pain, palpitations, orthopnea, claudication, leg swelling and PND.  Gastrointestinal:  Negative for abdominal pain, blood in stool, heartburn, melena, nausea and vomiting.  Musculoskeletal:  Positive for back  pain. Negative for falls and myalgias.  Skin:  Negative for rash.  Neurological:  Negative for dizziness, tingling, tremors, sensory change, speech change, focal weakness, loss of consciousness and weakness.  Endo/Heme/Allergies:  Does not bruise/bleed easily.  Psychiatric/Behavioral:  Negative for substance abuse. The patient is not nervous/anxious.   All other systems reviewed and are negative.   EKGs/Labs/Other Studies Reviewed:    Studies reviewed were summarized above. The additional studies were reviewed today:  2D echo 12/2017: - Left ventricle: The cavity size was normal. There was mild    concentric hypertrophy. Systolic function was normal. The    estimated ejection fraction was in the range of 55% to 60%. Wall    motion was normal; there were no regional wall motion    abnormalities. Doppler parameters are consistent with abnormal    left ventricular relaxation (grade 1 diastolic dysfunction).  - Ventricular septum: The contour showed diastolic flattening.  - Mitral valve: There was moderate to severe regurgitation directed    eccentrically.  - Left atrium: The atrium was moderately dilated.  - Right ventricle: The cavity size was moderately dilated. Wall    thickness was normal. Systolic function was moderately to    severely reduced.  - Right  atrium: The atrium was moderately dilated.  - Tricuspid valve: There was moderate regurgitation.  - Pulmonary arteries: Systolic pressure was severely elevated. PA    peak pressure: 70 mm Hg (S).  __________  [facility] 10/2017: Pharmacological myocardial perfusion imaging study with no significant  ischemia Normal wall motion, EF estimated at 88% No EKG changes concerning for ischemia at peak stress or in recovery. Resting EKG with T wave abnormality inferior leads, V3/V4/V5, PVCs noted Low risk scan __________  2D echo 10/2015: - Left ventricle: The cavity size was normal. Systolic function was    normal. The estimated ejection fraction was in the range of 60%    to 65%. Wall motion was normal; there were no regional wall    motion abnormalities. Doppler parameters are consistent with    abnormal left ventricular relaxation (grade 1 diastolic    dysfunction).  - Mitral valve: Prolapse. There was moderate regurgitation.  - Left atrium: The atrium was moderately dilated.  - Right ventricle: Systolic function was normal.  - Right atrium: The atrium was mildly dilated.  - Tricuspid valve: There was mild-moderate regurgitation.  - Pulmonary arteries: The main pulmonary artery was mildly dilated.    Systolic pressure was moderately elevated. PA peak pressure: 56    mm Hg (S).   Impressions:   - Pleural effusion noted on the left, 6.1 cm  __________  2D echo 03/2013: - Left ventricle: The cavity size was normal. Wall thickness    was normal. Systolic function was normal. The estimated    ejection fraction was in the range of 55% to 60%. Wall    motion was normal; there were no regional wall motion    abnormalities. Doppler parameters are consistent with    abnormal left ventricular relaxation (grade 1 diastolic    dysfunction).  - Aortic valve: Mild regurgitation.  - Mitral valve: Moderate prolapse, involving the anterior    leaflet and the posterior leaflet. Moderate  regurgitation.  - Right atrium: The atrium was mildly dilated.  - Atrial septum: No defect or patent foramen ovale was    identified.  - Pulmonary arteries: PA peak pressure: 33mm Hg (S). __________  2D echo 01/2012: - Left ventricle: The cavity size was normal. Wall  thickness    was normal. Systolic function was normal. The estimated    ejection fraction was in the range of 55% to 60%. Wall    motion was normal; there were no regional wall motion    abnormalities. Doppler parameters are consistent with    abnormal left ventricular relaxation (grade 1 diastolic    dysfunction).  - Aortic valve: Probably trileaflet. There was no stenosis.    Trivial regurgitation.  - Mitral valve: Mildly thickened leaflets . Moderate    myxomatous degeneration. Moderate regurgitation.  - Left atrium: The atrium was moderately dilated.  - Right ventricle: The cavity size was mildly dilated.    Systolic function was normal.  - Right atrium: The atrium was mildly dilated.  - Tricuspid valve: Peak RV-RA gradient: 61mm Hg (S).  - Pulmonary arteries: PA peak pressure: 64mm Hg (S).  - Inferior vena cava: The vessel was normal in size; the    respirophasic diameter changes were in the normal range (=    50%); findings are consistent with normal central venous    pressure.  Impressions:   - Normal LV size and systolic function, EF 12-75%. Mitral    valve prolapse with moderate mitral regurgitation. Mildly    dilated RV with normal systoilc function.    EKG:  EKG is ordered today.  The EKG ordered today demonstrates NSR, 71 bpm, right axis deviation, significant baseline artifact, no acute ST-T changes  Recent Labs: No results found for requested labs within last 8760 hours.  Recent Lipid Panel    Component Value Date/Time   CHOL 129 08/26/2019 1012   TRIG 56 08/26/2019 1012   HDL 51 08/26/2019 1012   CHOLHDL 2.5 08/26/2019 1012   VLDL 22 01/07/2018 2105   LDLCALC 64 08/26/2019 1012    PHYSICAL  EXAM:    VS:  BP 100/60 (BP Location: Right Arm, Patient Position: Sitting, Cuff Size: Normal)   Pulse 71   Ht 4' (1.219 m)   Wt 75 lb (34 kg)   BMI 22.89 kg/m   BMI: Body mass index is 22.89 kg/m.  Physical Exam Vitals reviewed.  Constitutional:      Appearance: She is well-developed.  HENT:     Head: Normocephalic and atraumatic.  Eyes:     General:        Right eye: No discharge.        Left eye: No discharge.  Neck:     Vascular: No JVD.  Cardiovascular:     Rate and Rhythm: Normal rate and regular rhythm.     Pulses:          Posterior tibial pulses are 2+ on the right side and 2+ on the left side.     Heart sounds: S1 normal and S2 normal. Heart sounds not distant. No midsystolic click and no opening snap. Murmur heard.  High-pitched blowing holosystolic murmur is present with a grade of 2/6 at the apex.    No friction rub.  Pulmonary:     Effort: Pulmonary effort is normal. No respiratory distress.     Breath sounds: Normal breath sounds. No decreased breath sounds, wheezing or rales.  Chest:     Chest wall: No tenderness.  Abdominal:     General: There is no distension.     Palpations: Abdomen is soft.     Tenderness: There is no abdominal tenderness.  Musculoskeletal:     Cervical back: Normal range of motion.     Comments: Significant thoracic  kyphosis noted. Mild erythema and soft tissue swelling involving the left second digit.  Skin:    General: Skin is warm and dry.     Nails: There is no clubbing.  Neurological:     Mental Status: She is alert and oriented to person, place, and time.  Psychiatric:        Speech: Speech normal.        Behavior: Behavior normal.        Thought Content: Thought content normal.        Judgment: Judgment normal.    Wt Readings from Last 3 Encounters:  10/04/21 75 lb (34 kg)  09/21/21 76 lb (34.5 kg)  08/30/21 76 lb 6.4 oz (34.7 kg)     ASSESSMENT & PLAN:   HFpEF: She appears euvolemic and well compensated.  She  remains on torsemide 40 mg twice daily.  She has not been taking supplemental potassium with this.  Update echo and CMP.  CHF education.  Mitral valve prolapse with moderate to severe mitral regurgitation: Update transthoracic echo.  She is uncertain if she would want to proceed with any invasive treatment modalities, and it remains uncertain if she would be a candidate given her comorbid conditions including significant thoracic kyphosis, underweight status, advanced age, and underlying COPD.  Pulmonary hypertension: Largely asymptomatic.  She remains on torsemide as above.  Check echo as above.  HTN: Blood pressure is stable in the office today.  She remains on metoprolol.  HLD: LDL 64 in 08/2019.  She remains on rosuvastatin.  Underweight: Her weight is down 8 pounds today when compared to her last clinic visit in 08/2018.  She reports a good appetite.  Consider supplementing with protein shakes.  Paronychia: No red flag symptoms at this time.  Recommend warm compress.  Will forward note to her PCP for further management.  Disposition: F/u with Dr. Rockey Situ or an APP in 2 months.   Medication Adjustments/Labs and Tests Ordered: Current medicines are reviewed at length with the patient today.  Concerns regarding medicines are outlined above. Medication changes, Labs and Tests ordered today are summarized above and listed in the Patient Instructions accessible in Encounters.   Signed, Christell Faith, PA-C 10/04/2021 1:14 PM     [address] [place], [place] [id number] [phone]

## 2021-10-04 ENCOUNTER — Encounter: Payer: Self-pay | Admitting: Physician Assistant

## 2021-10-04 ENCOUNTER — Ambulatory Visit (INDEPENDENT_AMBULATORY_CARE_PROVIDER_SITE_OTHER): Payer: PPO | Admitting: Physician Assistant

## 2021-10-04 ENCOUNTER — Other Ambulatory Visit: Payer: Self-pay

## 2021-10-04 VITALS — BP 100/60 | HR 71 | Ht <= 58 in | Wt 75.0 lb

## 2021-10-04 DIAGNOSIS — L03012 Cellulitis of left finger: Secondary | ICD-10-CM | POA: Diagnosis not present

## 2021-10-04 DIAGNOSIS — E782 Mixed hyperlipidemia: Secondary | ICD-10-CM | POA: Diagnosis not present

## 2021-10-04 DIAGNOSIS — I10 Essential (primary) hypertension: Secondary | ICD-10-CM

## 2021-10-04 DIAGNOSIS — I5032 Chronic diastolic (congestive) heart failure: Secondary | ICD-10-CM | POA: Diagnosis not present

## 2021-10-04 DIAGNOSIS — I341 Nonrheumatic mitral (valve) prolapse: Secondary | ICD-10-CM

## 2021-10-04 DIAGNOSIS — I080 Rheumatic disorders of both mitral and aortic valves: Secondary | ICD-10-CM

## 2021-10-04 DIAGNOSIS — R636 Underweight: Secondary | ICD-10-CM | POA: Diagnosis not present

## 2021-10-04 DIAGNOSIS — I272 Pulmonary hypertension, unspecified: Secondary | ICD-10-CM | POA: Diagnosis not present

## 2021-10-04 NOTE — Patient Instructions (Signed)
Medication Instructions:  Your physician recommends that you continue on your current medications as directed. Please refer to the Current Medication list given to you today.  *If you need a refill on your cardiac medications before your next appointment, please call your pharmacy*   Lab Work:  CMP to be drawn in office today.  If you have labs (blood work) drawn today and your tests are completely normal, you will receive your results only by: [place] (if you have MyChart) OR A paper copy in the mail If you have any lab test that is abnormal or we need to change your treatment, we will call you to review the results.   Testing/Procedures:  Your physician has requested that you have an echocardiogram. Echocardiography is a painless test that uses sound waves to create images of your heart. It provides your doctor with information about the size and shape of your heart and how well your heart's chambers and valves are working. This procedure takes approximately one hour. There are no restrictions for this procedure.    Follow-Up: At [facility], you and your health needs are our priority.  As part of our continuing mission to provide you with exceptional heart care, we have created designated Provider Care Teams.  These Care Teams include your primary Cardiologist (physician) and Advanced Practice Providers (APPs -  Physician Assistants and Nurse Practitioners) who all work together to provide you with the care you need, when you need it.  We recommend signing up for the patient portal called "MyChart".  Sign up information is provided on this After Visit Summary.  MyChart is used to connect with patients for Virtual Visits (Telemedicine).  Patients are able to view lab/test results, encounter notes, upcoming appointments, etc.  Non-urgent messages can be sent to your provider as well.   To learn more about what you can do with MyChart, go to [email or web address].    Your  next appointment:   2 month(s)  The format for your next appointment:   In Person  Provider:   You may see Ida Rogue, MD or one of the following Advanced Practice Providers on your designated Care Team:   Murray Hodgkins, NP Christell Faith, PA-C Marrianne Mood, PA-C Cadence Kathlen Mody, [place]   Other Instructions  Follow up with your Primary Care Provider about your low oxygen levels.

## 2021-10-05 ENCOUNTER — Telehealth: Payer: Self-pay | Admitting: Physician Assistant

## 2021-10-05 ENCOUNTER — Other Ambulatory Visit: Payer: Self-pay | Admitting: Family Medicine

## 2021-10-05 ENCOUNTER — Telehealth: Payer: Self-pay

## 2021-10-05 DIAGNOSIS — M48062 Spinal stenosis, lumbar region with neurogenic claudication: Secondary | ICD-10-CM

## 2021-10-05 DIAGNOSIS — G8929 Other chronic pain: Secondary | ICD-10-CM

## 2021-10-05 DIAGNOSIS — I890 Lymphedema, not elsewhere classified: Secondary | ICD-10-CM

## 2021-10-05 DIAGNOSIS — I5032 Chronic diastolic (congestive) heart failure: Secondary | ICD-10-CM

## 2021-10-05 NOTE — Telephone Encounter (Signed)
Copied from [facility] [phone]. Topic: General - Inquiry >> Oct 05, 2021  9:47 AM Greggory Keen D wrote: Pt called saying her cardiologist told her she needs an antibiotic for her ingrown fingernail. She did not want to make an appt with Dr. Raliegh Ip to be seen.  [facility]

## 2021-10-05 NOTE — Telephone Encounter (Signed)
Patient calling for update on getting an antibiotic .  Patient states Jennifer Skinner was going to talk to pcp and get meds ordered but the pharmacy doesn't have anything yet .

## 2021-10-05 NOTE — Telephone Encounter (Signed)
Requested medications are due for refill today yes  Requested medications are on the active medication list yes  Last refill 09/05/21  Last visit 07/25/21  Future visit scheduled 11/06/21  Notes to clinic Tramadol is not delegated, Demadex failed protocol of labs within 360 days, Tramadol UDS out of date, please assess.  Requested Prescriptions  Pending Prescriptions Disp Refills   torsemide (DEMADEX) 20 MG tablet [Pharmacy Med Name: TORSEMIDE 20 MG TABLET] 120 tablet 0    Sig: Take 2 tablets (40 mg total) by mouth 2 (two) times daily.     Cardiovascular:  Diuretics - Loop Failed - 10/05/2021  9:23 AM      Failed - K in normal range and within 360 days    Potassium  Date Value Ref Range Status  09/20/2020 4.6 3.5 - 5.3 mmol/L Final  09/12/2013 4.0 3.5 - 5.1 mmol/L Final          Failed - Ca in normal range and within 360 days    Calcium  Date Value Ref Range Status  09/20/2020 9.5 8.6 - 10.4 mg/dL Final   Calcium, Total  Date Value Ref Range Status  09/12/2013 9.0 8.5 - 10.1 mg/dL Final          Failed - Na in normal range and within 360 days    Sodium  Date Value Ref Range Status  09/20/2020 141 135 - 146 mmol/L Final  08/18/2018 141 134 - 144 mmol/L Final  09/12/2013 138 136 - 145 mmol/L Final          Failed - Cr in normal range and within 360 days    Creat  Date Value Ref Range Status  09/20/2020 0.67 0.60 - 0.88 mg/dL Final    Comment:    For patients >56 years of age, the reference limit for Creatinine is approximately 13% higher for people identified as African-American. .           Passed - Last BP in normal range    BP Readings from Last 1 Encounters:  10/04/21 100/60          Passed - Valid encounter within last 6 months    Recent Outpatient Visits           2 weeks ago Slow transit constipation   [place], DO   1 month ago Slow transit constipation   [facility] [place], Coralie Keens, NP   2 months ago Lumbar and sacral osteoarthritis   [place], DO   7 months ago Acute cystitis with hematuria   [place], DO   7 months ago Acute cystitis without hematuria   [place], DO       Future Appointments             In 1 month Parks Ranger, Devonne Doughty, DO [facility], [place]   In 2 months [place], MD [facility], [facility]   In 8 months  [facility], [facility]             traMADol (ULTRAM) 50 MG tablet [Pharmacy Med Name: TRAMADOL HCL 50 MG TABLET] 180 tablet 0    Sig: Take 1-2 tablets (50-100 mg total) by mouth every 6 (six) hours as needed for moderate pain. Take up to max 6 pills per 24 hours.     Not Delegated - Analgesics:  Opioid Agonists Failed - 10/05/2021  9:23 AM      Failed - This refill cannot be delegated      Failed - Urine Drug Screen completed in last 360 days      Passed - Valid encounter within last 6 months    Recent Outpatient Visits           2 weeks ago Slow transit constipation   [place], DO   1 month ago Slow transit constipation   [facility] [place], Coralie Keens, NP   2 months ago Lumbar and sacral osteoarthritis   [place], DO   7 months ago Acute cystitis with hematuria   [place], DO   7 months ago Acute cystitis without hematuria   [place], DO       Future Appointments             In 1 month Parks Ranger, Devonne Doughty, DO [facility], [place]   In 2 months Gollan, Kathlene November, MD [facility], [place]   In 8 months  [facility], [facility]

## 2021-10-05 NOTE — Telephone Encounter (Signed)
Spoke with patient and reviewed that chart with message was sent to Dr. Parks Ranger. She stated her pharmacy has not received any medication for this. Reviewed that her PCP would be the one to order something for that and she may want to call her primary care provider office. She verbalized understanding with no further questions at this time.

## 2021-10-06 LAB — COMPREHENSIVE METABOLIC PANEL
ALT: 28 IU/L (ref 0–32)
AST: 33 IU/L (ref 0–40)
Albumin/Globulin Ratio: 1.2 (ref 1.2–2.2)
Albumin: 3.6 g/dL (ref 3.6–4.6)
Alkaline Phosphatase: 102 IU/L (ref 44–121)
BUN/Creatinine Ratio: 35 — ABNORMAL HIGH (ref 12–28)
BUN: 25 mg/dL (ref 8–27)
Bilirubin Total: 0.2 mg/dL (ref 0.0–1.2)
CO2: 30 mmol/L — ABNORMAL HIGH (ref 20–29)
Calcium: 8.7 mg/dL (ref 8.7–10.3)
Chloride: 96 mmol/L (ref 96–106)
Creatinine, Ser: 0.72 mg/dL (ref 0.57–1.00)
Globulin, Total: 3.1 g/dL (ref 1.5–4.5)
Glucose: 70 mg/dL (ref 70–99)
Potassium: 4.7 mmol/L (ref 3.5–5.2)
Sodium: 139 mmol/L (ref 134–144)
Total Protein: 6.7 g/dL (ref 6.0–8.5)
eGFR: 80 mL/min/{1.73_m2} (ref >59)

## 2021-10-06 NOTE — Telephone Encounter (Signed)
Patient states all out of Torsemide 40 mg. She is asking for short supply to pharmacy.  [place], [place] Phone:  [phone]  Fax:  [phone]

## 2021-10-06 NOTE — Telephone Encounter (Signed)
Last filled 09/19/21 #120 - too soon Requested Prescriptions  Pending Prescriptions Disp Refills  . torsemide (DEMADEX) 20 MG tablet [Pharmacy Med Name: TORSEMIDE 20 MG TABLET] 120 tablet 0    Sig: Take 2 tablets (40 mg total) by mouth 2 (two) times daily.     Cardiovascular:  Diuretics - Loop Passed - 10/06/2021 11:13 AM      Passed - K in normal range and within 360 days    Potassium  Date Value Ref Range Status  10/04/2021 4.7 3.5 - 5.2 mmol/L Final  09/12/2013 4.0 3.5 - 5.1 mmol/L Final         Passed - Ca in normal range and within 360 days    Calcium  Date Value Ref Range Status  10/04/2021 8.7 8.7 - 10.3 mg/dL Final   Calcium, Total  Date Value Ref Range Status  09/12/2013 9.0 8.5 - 10.1 mg/dL Final         Passed - Na in normal range and within 360 days    Sodium  Date Value Ref Range Status  10/04/2021 139 134 - 144 mmol/L Final  09/12/2013 138 136 - 145 mmol/L Final         Passed - Cr in normal range and within 360 days    Creat  Date Value Ref Range Status  09/20/2020 0.67 0.60 - 0.88 mg/dL Final    Comment:    For patients >56 years of age, the reference limit for Creatinine is approximately 13% higher for people identified as African-American. .    Creatinine, Ser  Date Value Ref Range Status  10/04/2021 0.72 0.57 - 1.00 mg/dL Final         Passed - Last BP in normal range    BP Readings from Last 1 Encounters:  10/04/21 100/60         Passed - Valid encounter within last 6 months    Recent Outpatient Visits          2 weeks ago Slow transit constipation   [place], DO   1 month ago Slow transit constipation   [facility], Coralie Keens, NP   2 months ago Lumbar and sacral osteoarthritis   [place], DO   7 months ago Acute cystitis with hematuria   [place], DO   7 months ago Acute cystitis  without hematuria   [place], DO      Future Appointments            In 1 month [facility], [place]   In 2 months [place], MD [facility], [facility]   In 8 months  [facility], [facility]           . traMADol (ULTRAM) 50 MG tablet [Pharmacy Med Name: TRAMADOL HCL 50 MG TABLET] 180 tablet 0    Sig: Take 1-2 tablets (50-100 mg total) by mouth every 6 (six) hours as needed for moderate pain. Take up to max 6 pills per 24 hours.     Not Delegated - Analgesics:  Opioid Agonists Failed - 10/06/2021 11:13 AM      Failed - This refill cannot be delegated      Failed - Urine Drug Screen completed in last 360 days      Passed - Valid encounter within last 6 months  Recent Outpatient Visits          2 weeks ago Slow transit constipation   [place], DO   1 month ago Slow transit constipation   [facility] [place], Coralie Keens, NP   2 months ago Lumbar and sacral osteoarthritis   [place], DO   7 months ago Acute cystitis with hematuria   [place], DO   7 months ago Acute cystitis without hematuria   [place], DO      Future Appointments            In 1 month Parks Ranger, Devonne Doughty, DO [facility], [place]   In 2 months [place] Situ, Kathlene November, MD [facility], [place]   In 8 months  [facility], [place]

## 2021-10-06 NOTE — Telephone Encounter (Signed)
Unfortunately Dr. Raliegh Ip is out of the office and this is something that she will have to be seen for.  The office is closed today.  If she cannot wait until next week, would recommend urgent care over the weekend.

## 2021-10-06 NOTE — Telephone Encounter (Signed)
Requested medication (s) are due for refill today - yes  Requested medication (s) are on the active medication list -yes  Future visit scheduled -yes  Last refill: 07/25/21 #180 2 RF  Notes to clinic: Request RF: non delegated Rx  Requested Prescriptions  Pending Prescriptions Disp Refills   traMADol (ULTRAM) 50 MG tablet [Pharmacy Med Name: TRAMADOL HCL 50 MG TABLET] 180 tablet 0    Sig: Take 1-2 tablets (50-100 mg total) by mouth every 6 (six) hours as needed for moderate pain. Take up to max 6 pills per 24 hours.     Not Delegated - Analgesics:  Opioid Agonists Failed - 10/06/2021 11:13 AM      Failed - This refill cannot be delegated      Failed - Urine Drug Screen completed in last 360 days      Passed - Valid encounter within last 6 months    Recent Outpatient Visits           2 weeks ago Slow transit constipation   [place], DO   1 month ago Slow transit constipation   [facility] [place], Coralie Keens, NP   2 months ago Lumbar and sacral osteoarthritis   [place], DO   7 months ago Acute cystitis with hematuria   [place], DO   7 months ago Acute cystitis without hematuria   [place], DO       Future Appointments             In 1 month [facility], [place]   In 2 months [place], MD [facility], [facility]   In 8 months  [facility], [facility]            Refused Prescriptions Disp Refills   torsemide (DEMADEX) 20 MG tablet [Pharmacy Med Name: TORSEMIDE 20 MG TABLET] 120 tablet 0    Sig: Take 2 tablets (40 mg total) by mouth 2 (two) times daily.     Cardiovascular:  Diuretics - Loop Passed - 10/06/2021 11:13 AM      Passed - K in normal range and within 360 days    Potassium  Date Value Ref Range  Status  10/04/2021 4.7 3.5 - 5.2 mmol/L Final  09/12/2013 4.0 3.5 - 5.1 mmol/L Final          Passed - Ca in normal range and within 360 days    Calcium  Date Value Ref Range Status  10/04/2021 8.7 8.7 - 10.3 mg/dL Final   Calcium, Total  Date Value Ref Range Status  09/12/2013 9.0 8.5 - 10.1 mg/dL Final          Passed - Na in normal range and within 360 days    Sodium  Date Value Ref Range Status  10/04/2021 139 134 - 144 mmol/L Final  09/12/2013 138 136 - 145 mmol/L Final          Passed - Cr in normal range and within 360 days    Creat  Date Value Ref Range Status  09/20/2020 0.67 0.60 - 0.88 mg/dL Final    Comment:    For patients >41 years of age, the reference limit for Creatinine is approximately 13% higher for people identified as African-American. .    Creatinine, Ser  Date Value Ref Range Status  10/04/2021 0.72  0.57 - 1.00 mg/dL Final          Passed - Last BP in normal range    BP Readings from Last 1 Encounters:  10/04/21 100/60          Passed - Valid encounter within last 6 months    Recent Outpatient Visits           2 weeks ago Slow transit constipation   [place], DO   1 month ago Slow transit constipation   [facility] [place], Coralie Keens, NP   2 months ago Lumbar and sacral osteoarthritis   [place], DO   7 months ago Acute cystitis with hematuria   [place], DO   7 months ago Acute cystitis without hematuria   [place], DO       Future Appointments             In 1 month Parks Ranger, Devonne Doughty, DO [facility] [place], [place]   In 2 months [place], MD [facility], [facility]   In 8 months  [facility], [facility]               Requested Prescriptions  Pending Prescriptions Disp Refills    traMADol (ULTRAM) 50 MG tablet [Pharmacy Med Name: TRAMADOL HCL 50 MG TABLET] 180 tablet 0    Sig: Take 1-2 tablets (50-100 mg total) by mouth every 6 (six) hours as needed for moderate pain. Take up to max 6 pills per 24 hours.     Not Delegated - Analgesics:  Opioid Agonists Failed - 10/06/2021 11:13 AM      Failed - This refill cannot be delegated      Failed - Urine Drug Screen completed in last 360 days      Passed - Valid encounter within last 6 months    Recent Outpatient Visits           2 weeks ago Slow transit constipation   [place], DO   1 month ago Slow transit constipation   [facility] [place], Coralie Keens, NP   2 months ago Lumbar and sacral osteoarthritis   [place], DO   7 months ago Acute cystitis with hematuria   [place], DO   7 months ago Acute cystitis without hematuria   [place], DO       Future Appointments             In 1 month [facility], [place]   In 2 months [place], MD [facility], [facility]   In 8 months  [facility], [facility]            Refused Prescriptions Disp Refills   torsemide (DEMADEX) 20 MG tablet [Pharmacy Med Name: TORSEMIDE 20 MG TABLET] 120 tablet 0    Sig: Take 2 tablets (40 mg total) by mouth 2 (two) times daily.     Cardiovascular:  Diuretics - Loop Passed - 10/06/2021 11:13 AM      Passed - K in normal range and within 360 days    Potassium  Date Value Ref Range Status  10/04/2021 4.7 3.5 - 5.2 mmol/L Final  09/12/2013 4.0  3.5 - 5.1 mmol/L Final          Passed - Ca in normal range and within 360 days    Calcium  Date Value Ref Range Status  10/04/2021 8.7 8.7 - 10.3 mg/dL Final   Calcium, Total  Date Value Ref Range Status  09/12/2013 9.0 8.5 -  10.1 mg/dL Final          Passed - Na in normal range and within 360 days    Sodium  Date Value Ref Range Status  10/04/2021 139 134 - 144 mmol/L Final  09/12/2013 138 136 - 145 mmol/L Final          Passed - Cr in normal range and within 360 days    Creat  Date Value Ref Range Status  09/20/2020 0.67 0.60 - 0.88 mg/dL Final    Comment:    For patients >50 years of age, the reference limit for Creatinine is approximately 13% higher for people identified as African-American. .    Creatinine, Ser  Date Value Ref Range Status  10/04/2021 0.72 0.57 - 1.00 mg/dL Final          Passed - Last BP in normal range    BP Readings from Last 1 Encounters:  10/04/21 100/60          Passed - Valid encounter within last 6 months    Recent Outpatient Visits           2 weeks ago Slow transit constipation   [place], DO   1 month ago Slow transit constipation   [facility] [place], Coralie Keens, NP   2 months ago Lumbar and sacral osteoarthritis   [place], DO   7 months ago Acute cystitis with hematuria   [place], DO   7 months ago Acute cystitis without hematuria   [place], DO       Future Appointments             In 1 month [facility], [place]   In 2 months Gollan, Kathlene November, MD [facility], [place]   In 8 months  [facility], [facility]

## 2021-10-09 ENCOUNTER — Telehealth: Payer: Self-pay | Admitting: Family Medicine

## 2021-10-09 NOTE — Telephone Encounter (Signed)
Could you contact patient to check in with her - she notified her cardiologist when she saw them that she had an infected Left finger/nail bed dx with Paronychia of finger and advised warm compresses.  Can you check to see if this is improving or if she needs assistance with it? We can order an antibiotic course if not improving and then follow-up with her after  Nobie Putnam, [facility] 10/09/2021, 12:43 PM

## 2021-10-17 ENCOUNTER — Other Ambulatory Visit: Payer: Self-pay | Admitting: Family Medicine

## 2021-10-17 DIAGNOSIS — I5032 Chronic diastolic (congestive) heart failure: Secondary | ICD-10-CM

## 2021-10-17 DIAGNOSIS — I890 Lymphedema, not elsewhere classified: Secondary | ICD-10-CM

## 2021-10-18 NOTE — Telephone Encounter (Signed)
Requested Prescriptions  Pending Prescriptions Disp Refills  . torsemide (DEMADEX) 20 MG tablet [Pharmacy Med Name: TORSEMIDE 20 MG TABLET] 360 tablet 0    Sig: Take 2 tablets (40 mg total) by mouth 2 (two) times daily.     Cardiovascular:  Diuretics - Loop Passed - 10/17/2021 10:25 AM      Passed - K in normal range and within 360 days    Potassium  Date Value Ref Range Status  10/04/2021 4.7 3.5 - 5.2 mmol/L Final  09/12/2013 4.0 3.5 - 5.1 mmol/L Final         Passed - Ca in normal range and within 360 days    Calcium  Date Value Ref Range Status  10/04/2021 8.7 8.7 - 10.3 mg/dL Final   Calcium, Total  Date Value Ref Range Status  09/12/2013 9.0 8.5 - 10.1 mg/dL Final         Passed - Na in normal range and within 360 days    Sodium  Date Value Ref Range Status  10/04/2021 139 134 - 144 mmol/L Final  09/12/2013 138 136 - 145 mmol/L Final         Passed - Cr in normal range and within 360 days    Creat  Date Value Ref Range Status  09/20/2020 0.67 0.60 - 0.88 mg/dL Final    Comment:    For patients >11 years of age, the reference limit for Creatinine is approximately 13% higher for people identified as African-American. .    Creatinine, Ser  Date Value Ref Range Status  10/04/2021 0.72 0.57 - 1.00 mg/dL Final         Passed - Last BP in normal range    BP Readings from Last 1 Encounters:  10/04/21 100/60         Passed - Valid encounter within last 6 months    Recent Outpatient Visits          3 weeks ago Slow transit constipation   [place], DO   1 month ago Slow transit constipation   [facility] [place], Coralie Keens, NP   2 months ago Lumbar and sacral osteoarthritis   [place], DO   7 months ago Acute cystitis with hematuria   [place], DO   8 months ago Acute cystitis without hematuria   [place], DO      Future Appointments            In 2 weeks Parks Ranger, Devonne Doughty, [facility], [place]   In 1 month Gollan, Kathlene November, MD [facility], [place]   In 8 months  [facility] [place], [facility]

## 2021-11-06 ENCOUNTER — Encounter: Payer: Self-pay | Admitting: Family Medicine

## 2021-11-06 ENCOUNTER — Ambulatory Visit (INDEPENDENT_AMBULATORY_CARE_PROVIDER_SITE_OTHER): Payer: PPO | Admitting: Family Medicine

## 2021-11-06 ENCOUNTER — Other Ambulatory Visit: Payer: Self-pay

## 2021-11-06 VITALS — BP 122/53 | HR 84 | Ht <= 58 in | Wt 80.0 lb

## 2021-11-06 DIAGNOSIS — K625 Hemorrhage of anus and rectum: Secondary | ICD-10-CM

## 2021-11-06 DIAGNOSIS — K623 Rectal prolapse: Secondary | ICD-10-CM | POA: Diagnosis not present

## 2021-11-06 DIAGNOSIS — E43 Unspecified severe protein-calorie malnutrition: Secondary | ICD-10-CM | POA: Diagnosis not present

## 2021-11-06 DIAGNOSIS — K648 Other hemorrhoids: Secondary | ICD-10-CM | POA: Diagnosis not present

## 2021-11-06 DIAGNOSIS — N3281 Overactive bladder: Secondary | ICD-10-CM

## 2021-11-06 NOTE — Patient Instructions (Addendum)
Thank you for coming to the office today.  [facility] [address] [place],  [place]  [address] Phone: [phone]  Referral sent, stay tuned for apt. Likely to be protruding internal hemorrhoid causing bleeding and some rectal prolapse.  Keep on miralax to avoid straining.  You should have enough tramadol for up to max 6 per day you can take 1 extra if you like.  Keep on Myrbetriq for urination  Please schedule a Follow-up Appointment to: Return in about 3 months (around 02/06/2022) for 3 month follow-up back pain, constipation/hemorrhoids.  If you have any other questions or concerns, please feel free to call the office or send a message through [place]. You may also schedule an earlier appointment if necessary.  Additionally, you may be receiving a survey about your experience at our office within a few days to 1 week by e-mail or mail. We value your feedback.  Nobie Putnam, DO [place]

## 2021-11-06 NOTE — Progress Notes (Signed)
Subjective:    Patient ID: Jennifer Skinner, female    DOB: March 30, 1931, 87 y.o.   MRN: [id number]  Jennifer Skinner is a 87 y.o. female presenting on 11/06/2021 for Pain   HPI  Follow-up Acute on Chronic Mid/Upper Back Pain Known problem in past with prior back pain similar. History of osteoarthritis. Spinal stenosis On medication chronically with good results, taking Tylenol and Tramadol regularly with good results but has had worse back pain flare up. - Taking Tramadol 22m x 2 in AM, x 1 at 10am, and x 1 at 2pm, and x 1 at bedtime   Reports that symptoms started recently with now flare of upper back pain again, had this before then her dose of Tramadol was increased seems to return now. Admits chronic back pain  She is taking baclofen, and tylenol higher dose  - She is taking Tramadol 55mx 2 in AM for 10076mhen 3 additional doses throughout day, doing well with some significant benefit now adhering better. - Taking Gabapentin 100m44mD - She still takes Aspirin 81mg11mly due to history of eye hemorrhage in past  Internal Hemorrhoid, prolapse Constipation She has had difficulty with large hemorrhoid with some bleeding, wears diaper/depend  Protein-calorie Malnutrition, severe Today weight gain based on scale.  OAB On Myrbetriq 25mg 18my, she now has issues with bladder not functioning properly, often will try to void but cannot.   Depression screen PHQ 2/[facility]/11/2021 03/15/2020 12/08/2019  Decreased Interest 0 0 0  Down, Depressed, Hopeless 0 0 0  PHQ - 2 Score 0 0 0  Altered sleeping - - -  Tired, decreased energy - - -  Change in appetite - - -  Feeling bad or failure about yourself  - - -  Trouble concentrating - - -  Moving slowly or fidgety/restless - - -  Suicidal thoughts - - -  PHQ-9 Score - - -  Difficult doing work/chores - - -    Social History   Tobacco Use   Smoking status: Never   Smokeless tobacco: Never  Vaping Use   Vaping Use: Never used   Substance Use Topics   Alcohol use: No    Alcohol/week: 0.0 standard drinks   Drug use: No    Review of Systems Per HPI unless specifically indicated above     Objective:    BP (!) 122/53   Pulse 84   Ht 4' (1.219 m)   Wt 80 lb (36.3 kg)   SpO2 90%   BMI 24.41 kg/m   Wt Readings from Last 3 Encounters:  11/06/21 80 lb (36.3 kg)  10/04/21 75 lb (34 kg)  09/21/21 76 lb (34.5 kg)    Physical Exam Vitals and nursing note reviewed.  Constitutional:      General: She is not in acute distress.    Appearance: Normal appearance. She is well-developed. She is not diaphoretic.     Comments: Thin cachectic 89 yea35old female comfortable, cooperative  HENT:     Head: Normocephalic and atraumatic.  Eyes:     General:        Right eye: No discharge.        Left eye: No discharge.     Conjunctiva/sclera: Conjunctivae normal.  Cardiovascular:     Rate and Rhythm: Normal rate.  Pulmonary:     Effort: Pulmonary effort is normal.  Musculoskeletal:     Comments: Chronic curvature of spine with exaggerated thoracic kyphosis  Skin:  General: Skin is warm and dry.     Findings: No erythema or rash.  Neurological:     Mental Status: She is alert and oriented to person, place, and time.  Psychiatric:        Mood and Affect: Mood normal.        Behavior: Behavior normal.        Thought Content: Thought content normal.     Comments: Well groomed, good eye contact, normal speech and thoughts      Results for orders placed or performed in visit on 10/04/21  Comp Met (CMET)  Result Value Ref Range   Glucose 70 70 - 99 mg/dL   BUN 25 8 - 27 mg/dL   Creatinine, Ser 0.72 0.57 - 1.00 mg/dL   eGFR 80 >59 mL/min/1.73   BUN/Creatinine Ratio 35 (H) 12 - 28   Sodium 139 134 - 144 mmol/L   Potassium 4.7 3.5 - 5.2 mmol/L   Chloride 96 96 - 106 mmol/L   CO2 30 (H) 20 - 29 mmol/L   Calcium 8.7 8.7 - 10.3 mg/dL   Total Protein 6.7 6.0 - 8.5 g/dL   Albumin 3.6 3.6 - 4.6 g/dL   Globulin,  Total 3.1 1.5 - 4.5 g/dL   Albumin/Globulin Ratio 1.2 1.2 - 2.2   Bilirubin Total 0.2 0.0 - 1.2 mg/dL   Alkaline Phosphatase 102 44 - 121 IU/L   AST 33 0 - 40 IU/L   ALT 28 0 - 32 IU/L      Assessment & Plan:   Problem List Items Addressed This Visit     Protein-calorie malnutrition, severe (HCC)   OAB (overactive bladder)   Other Visit Diagnoses     Prolapsed internal hemorrhoids    -  Primary   Relevant Orders   Ambulatory referral to General Surgery   Rectal prolapse       Relevant Orders   Ambulatory referral to General Surgery   Rectal bleeding       Relevant Orders   Ambulatory referral to General Surgery       Prolapsed Hemorrhoids / Rectal Prolapse / Rectal Bleeding Given constellation of symptoms complicating this scenario, will refer to Gen Surgery for more structural evaluation and management. Symptom relief for hemorrhoid is unlikely to resolve this problem topically. - Refer to [facility]  OAB Continue Myrbetriq May need Urology consult in future if issue with structural bladder dysfunction as she describes sometimes voiding only successful with manual pressure at times.  Protein calorie malnutrition Improved wt gain today  Chronic Back Pain Acute flare See prior records, OA/DJD spinal stenosis lumbar DDD On opiate with Tramadol regularly for pain has managed pain mostly, some breakthrough pain, discussed max dose is 6 pills per 24 hours, she is currently at 5 per day, may take extra pill if need, continue Tylenol PRN Refill when ready  Orders Placed This Encounter  Procedures   Ambulatory referral to General Surgery    Referral Priority:   Routine    Referral Type:   Surgical    Referral Reason:   Specialty Services Required    Requested Specialty:   General Surgery    Number of Visits Requested:   1     No orders of the defined types were placed in this encounter.     Follow up plan: Return in about 3 months (around 02/06/2022) for 3  month follow-up back pain, constipation/hemorrhoids.   Nobie Putnam, [facility] 11/06/2021, 11:01 AM

## 2021-11-13 ENCOUNTER — Other Ambulatory Visit: Payer: Self-pay | Admitting: Family Medicine

## 2021-11-13 DIAGNOSIS — I272 Pulmonary hypertension, unspecified: Secondary | ICD-10-CM

## 2021-11-13 NOTE — Telephone Encounter (Signed)
Rx refilled 09/05/2021 #90 with 1 refill. Pt should have enough medication to last until 02/2022.

## 2021-11-13 NOTE — Telephone Encounter (Signed)
Requested Prescriptions  Pending Prescriptions Disp Refills  . sildenafil (REVATIO) 20 MG tablet [Pharmacy Med Name: SILDENAFIL 20 MG TABLET] 90 tablet 2    Sig: Take 1 tablet (20 mg total) by mouth 3 (three) times daily. For pulmonary hypertension     Urology: Erectile Dysfunction Agents Passed - 11/13/2021  2:54 PM      Passed - Last BP in normal range    BP Readings from Last 1 Encounters:  11/06/21 (!) 122/53         Passed - Valid encounter within last 12 months    Recent Outpatient Visits          1 week ago Prolapsed internal hemorrhoids   [place], DO   1 month ago Slow transit constipation   [place], DO   2 months ago Slow transit constipation   [facility] [place], Coralie Keens, NP   3 months ago Lumbar and sacral osteoarthritis   [place], DO   8 months ago Acute cystitis with hematuria   [place], DO      Future Appointments            In 1 month Gollan, Kathlene November, MD [facility], [place]   In 2 months [place], Devonne Doughty, [facility], [place]   In 7 months  [facility], [facility]

## 2021-11-16 ENCOUNTER — Encounter: Payer: Self-pay | Admitting: Surgery

## 2021-11-16 ENCOUNTER — Other Ambulatory Visit: Payer: Self-pay | Admitting: Family Medicine

## 2021-11-16 ENCOUNTER — Ambulatory Visit (INDEPENDENT_AMBULATORY_CARE_PROVIDER_SITE_OTHER): Payer: PPO | Admitting: Surgery

## 2021-11-16 ENCOUNTER — Other Ambulatory Visit: Payer: Self-pay

## 2021-11-16 VITALS — BP 115/61 | HR 77 | Temp 98.5°F | Ht <= 58 in | Wt 80.8 lb

## 2021-11-16 DIAGNOSIS — G8929 Other chronic pain: Secondary | ICD-10-CM

## 2021-11-16 DIAGNOSIS — N8189 Other female genital prolapse: Secondary | ICD-10-CM | POA: Insufficient documentation

## 2021-11-16 DIAGNOSIS — M48062 Spinal stenosis, lumbar region with neurogenic claudication: Secondary | ICD-10-CM

## 2021-11-16 DIAGNOSIS — N814 Uterovaginal prolapse, unspecified: Secondary | ICD-10-CM

## 2021-11-16 DIAGNOSIS — K648 Other hemorrhoids: Secondary | ICD-10-CM | POA: Diagnosis not present

## 2021-11-16 NOTE — Patient Instructions (Signed)
You may use Preparation H for any flair up of your hemorrhoids.   You should take a fiber supplement every day to keep your stools regular. You can use Fiber gummies or Benefiber powder in liquid.  Follow-up with our office as needed.  Please call and ask to speak with a nurse if you develop questions or concerns.

## 2021-11-16 NOTE — Progress Notes (Signed)
Patient ID: Jennifer Skinner, female   DOB: 1930/12/28, 87 y.o.   MRN: [id number]  Chief Complaint: Bright red blood associated with hemorrhoids.  History of Present Illness Jennifer Skinner is a 87 y.o. female with hemorrhoids/hemorrhoidal prolapse for years.  She had an exacerbation 2 to 3 weeks ago, with associated increased discomfort, and some bright red bleeding.  She takes prune juice and eats prunes as her form of bowel control.  She reports her bowel movements are typically spontaneous, as she wears a diaper.  Probably every other day.  She is careful not to overdo the prune juice as she does not want to have diarrhea.  She does not take any regular fiber supplementation.  She is only applied Vaseline to the hemorrhoids.  She does report utilizing sitz bath's.  She reports that she has had no issues with easy reduction of the hemorrhoids and no bleeding or inflammatory issues for the last 4 days. Seems concerned primarily about the degree of blood loss, not wanting to require a transfusion.  Past Medical History Past Medical History:  Diagnosis Date   1st degree AV block    Arthritis    Chronic diastolic CHF (congestive heart failure) ([place])    a. echo 2014: EF 55-60%, nl wall motion, GR1DD, mild MR, moder mitral prolapse, PASP 40 mm Hg; b. echo 09/2015: EF 60-65%, nl wall motion, GR1DD, mod MR, mild to mod TR, PASP 56 mm Hg   COPD (chronic obstructive pulmonary disease) (HCC)    Degenerative disc disease, lumbar    Gastro-esophageal reflux    Hyperlipidemia    Mitral prolapse    Mitral regurgitation    Osteoporosis       Past Surgical History:  Procedure Laterality Date   ABDOMINAL HYSTERECTOMY     APPENDECTOMY     DILATION AND CURETTAGE OF UTERUS     HYSTEROTOMY     right breast biopsy     right eye      surgery   TONSILLECTOMY      Allergies  Allergen Reactions   Penicillins     Rash     Current Outpatient Medications  Medication Sig Dispense Refill    acetaminophen (TYLENOL) 500 MG tablet Take 500 mg by mouth every 6 (six) hours as needed.     aspirin EC 81 MG tablet Take 1 tablet (81 mg total) by mouth daily. 90 tablet 3   Calcium Gluconate 500 MG CAPS Take 2 capsules by mouth daily.      gabapentin (NEURONTIN) 100 MG capsule Take 1 capsule (100 mg total) by mouth 3 (three) times daily. 270 capsule 3   levothyroxine (SYNTHROID) 25 MCG tablet Take 1 tablet (25 mcg total) by mouth daily before breakfast. 90 tablet 3   metoprolol tartrate (LOPRESSOR) 25 MG tablet Take 0.5 tablets (12.5 mg total) by mouth 2 (two) times daily. 90 tablet 3   mirabegron ER (MYRBETRIQ) 25 MG TB24 tablet Take 1 tablet (25 mg total) by mouth daily. 30 tablet 5   Multiple Vitamin (MULTI-VITAMIN DAILY PO) Take 1 tablet by mouth daily.     Oyster Shell (OYSTER CALCIUM) 500 MG TABS Take 500 mg of elemental calcium by mouth 2 (two) times daily.     polyethylene glycol powder (GLYCOLAX/MIRALAX) powder Take 17-34 g by mouth daily as needed. 250 g 2   rosuvastatin (CRESTOR) 10 MG tablet Take 1 tablet (10 mg total) by mouth at bedtime. 90 tablet 3   sildenafil (REVATIO) 20 MG tablet  Take 1 tablet (20 mg total) by mouth 3 (three) times daily. For pulmonary hypertension 90 tablet 2   torsemide (DEMADEX) 20 MG tablet Take 2 tablets (40 mg total) by mouth 2 (two) times daily. 360 tablet 0   traMADol (ULTRAM) 50 MG tablet Take 1-2 tablets (50-100 mg total) by mouth every 6 (six) hours as needed for moderate pain. Take up to max 6 pills per 24 hours. 180 tablet 0   No current facility-administered medications for this visit.    Family History Family History  Problem Relation Age of Onset   CVA Mother 53   Heart attack Father 72   Throat cancer Sister    CAD Brother       Social History Social History   Tobacco Use   Smoking status: Never   Smokeless tobacco: Never  Vaping Use   Vaping Use: Never used  Substance Use Topics   Alcohol use: No    Alcohol/week: 0.0  standard drinks   Drug use: No        Review of Systems  Unable to perform ROS: Dementia     Physical Exam Blood pressure 115/61, pulse 77, temperature 98.5 F (36.9 C), height 4\' 9"  (1.448 m), weight 80 lb 12.8 oz (36.7 kg), SpO2 91 %. Last Weight  Most recent update: 11/16/2021  9:41 AM    Weight  36.7 kg (80 lb 12.8 oz)             CONSTITUTIONAL: Frail, thin, elderly patient, wheelchair-bound, lordotic, appropriately responsive and aware without distress.   EYES: Sclera non-icteric.   EARS, NOSE, MOUTH AND THROAT: Mask worn.  Hearing is intact to voice.  NECK: Trachea is midline, and there is no jugular venous distension.  LYMPH NODES:  Lymph nodes in the neck are not enlarged. RESPIRATORY:  Lungs are clear, without wheezing or stridor. Normal respiratory effort without pathologic use of accessory muscles. CARDIOVASCULAR: Heart is regular in rate and rhythm. GI: The abdomen is soft, nontender, and nondistended.  GU: She has a degree of prolapsed internal hemorrhoids which are easily reducible, without evidence of ulceration, or active bleeding or thrombosis.  Her anterior vaginal wall is prolapsed with dry squamous epithelium readily visible through her introitus.  This is consistent with significant pelvic floor failure. MUSCULOSKELETAL: No acute bony deformity or trauma present.   Lordosis with marked arthritic changes. SKIN: Skin turgor is normal, she does not appear dehydrated. NEUROLOGIC: Appropriately responsive, nonfocal. PSYCH:  Alert and oriented to person, place and time. Affect is appropriate for situation.  Data Reviewed I have personally reviewed what is currently available of the patient's imaging, recent labs and medical records.   Labs:  CBC Latest Ref Rng & Units 09/20/2020 08/26/2019 03/04/2018  WBC 3.8 - 10.8 Thousand/uL 4.4 4.0 4.0  Hemoglobin 11.7 - 15.5 g/dL 13.4 12.5 13.1  Hematocrit 35.0 - 45.0 % 40.1 37.3 38.2  Platelets 140 - 400 Thousand/uL 257  210 218   CMP Latest Ref Rng & Units 10/04/2021 09/20/2020 08/26/2019  Glucose 70 - 99 mg/dL 70 77 88  BUN 8 - 27 mg/dL 25 21 22   Creatinine 0.57 - 1.00 mg/dL 0.72 0.67 0.65  Sodium 134 - 144 mmol/L 139 141 137  Potassium 3.5 - 5.2 mmol/L 4.7 4.6 4.2  Chloride 96 - 106 mmol/L 96 101 99  CO2 20 - 29 mmol/L 30(H) 29 30  Calcium 8.7 - 10.3 mg/dL 8.7 9.5 8.9  Total Protein 6.0 - 8.5 g/dL 6.7 7.1 6.6  Total Bilirubin 0.0 - 1.2 mg/dL 0.2 0.4 0.4  Alkaline Phos 44 - 121 IU/L 102 - -  AST 0 - 40 IU/L 33 79(H) 33  ALT 0 - 32 IU/L 28 82(H) 27      Imaging: Radiology review:  Within last 24 hrs: No results found.  Assessment    Pelvic floor laxity.  With cystocele anteriorly and hemorrhoidal prolapse posteriorly.  No evidence of vascular compromise or active bleeding or ulceration.  Patient Active Problem List   Diagnosis Date Noted   Chronic left hip pain 07/02/2019   Primary osteoarthritis of both hips 07/02/2019   OAB (overactive bladder) 06/10/2018   Elevated hemoglobin A1c 03/07/2018   Protein-calorie malnutrition, severe ([place]) 01/10/2018   Pulmonary HTN ([place]) 01/08/2018   Lymphedema 10/28/2017   Hyperlipidemia 10/08/2017   Chronic back pain 08/26/2017   Other constipation 08/19/2017   Essential hypertension 03/13/2016   COPD (chronic obstructive pulmonary disease) (HCC)    Chronic diastolic CHF (congestive heart failure) ([place])    1st degree AV block    Interstitial lung disease ([place]) 10/17/2015   Hypothyroidism 09/27/2015   Lumbar and sacral osteoarthritis 03/11/2015   Degeneration of intervertebral disc of lumbar region 07/13/2014   Lumbar radiculitis 07/13/2014   Lumbar stenosis with neurogenic claudication 07/13/2014   History of neutropenia 03/31/2013   ION (ischemic optic neuropathy) 09/30/2012   OP (osteoporosis) 09/30/2012   MITRAL REGURGITATION 04/12/2009   Mitral valve disorder 04/12/2009   GERD 04/12/2009    Plan    Advised to increase fiber daily with a  supplement in addition to her prunes and prune juice.  Made aware that the majority of this may be through natural sources, but advised to be aware of actual consumption and to ensure minimal consumption by daily supplementation.  Various forms of supplements discussed.  Strongly advised to consume more fluids to ensure adequate hydration, instructed to watch color of urine to determine adequacy of hydration.  Clarity is pursued in urine output, and bowel activity that correlates to significant meal intake.  Patient is to avoid deferring having bowel movements, advised to take the time at the first sign of sensation, typically following meals and in the morning.  Subsequent utilization of MiraLAX to ensure at least daily movement, ideally twice daily bowel movements.  If multiple doses of MiraLAX are necessary utilize them.  Increasing frequency of movements and therefore decreasing BM volume is the goal without causing diarrhea.  Additional pelvic supportive therapies may be warranted. Topical Anusol HC should aid in keeping inflammation in check.   Face-to-face time spent with the patient and accompanying care providers(if present) was 45 minutes, with more than 50% of the time spent counseling, educating, and coordinating care of the patient.    These notes generated with voice recognition software. I apologize for typographical errors.  Ronny Bacon M.D., FACS 11/16/2021, 9:51 AM

## 2021-11-16 NOTE — Telephone Encounter (Signed)
Requested medication (s) are due for refill today: yes  Requested medication (s) are on the active medication list: yes  Last refill:  10/09/21  Future visit scheduled: 02/06/22  Notes to clinic:  This medication can not be delegated, please assess.  Requested Prescriptions  Pending Prescriptions Disp Refills   traMADol (ULTRAM) 50 MG tablet [Pharmacy Med Name: TRAMADOL HCL 50 MG TABLET] 180 tablet 0    Sig: Take 1-2 tablets (50-100 mg total) by mouth every 6 (six) hours as needed for moderate pain. Take up to max 6 pills per 24 hours.     Not Delegated - Analgesics:  Opioid Agonists Failed - 11/16/2021  2:09 PM      Failed - This refill cannot be delegated      Failed - Urine Drug Screen completed in last 360 days      Passed - Valid encounter within last 6 months    Recent Outpatient Visits           1 week ago Prolapsed internal hemorrhoids   [place], DO   1 month ago Slow transit constipation   [place], DO   2 months ago Slow transit constipation   [facility] [place], Coralie Keens, NP   3 months ago Lumbar and sacral osteoarthritis   [place], DO   8 months ago Acute cystitis with hematuria   [place], DO       Future Appointments             In 3 weeks Gollan, Kathlene November, MD [facility], [facility]   In 2 months [place], Devonne Doughty, [facility], [place]   In 7 months  [facility], [facility]

## 2021-11-17 NOTE — Telephone Encounter (Signed)
Patient is calling back checking on the status of Tramadol, patient states that she's been out for 3 days and doesn't want to go through a withdrawal. Patient is requesting a callback [phone] please advise

## 2021-11-21 ENCOUNTER — Other Ambulatory Visit: Payer: Self-pay

## 2021-11-21 ENCOUNTER — Ambulatory Visit (INDEPENDENT_AMBULATORY_CARE_PROVIDER_SITE_OTHER): Payer: PPO

## 2021-11-21 DIAGNOSIS — I080 Rheumatic disorders of both mitral and aortic valves: Secondary | ICD-10-CM | POA: Diagnosis not present

## 2021-11-22 LAB — ECHOCARDIOGRAM COMPLETE
AV Mean grad: 3 mmHg
AV Peak grad: 4.6 mmHg
Ao pk vel: 1.07 m/s
Area-P 1/2: 2.2 cm2
MV M vel: 5.88 m/s
MV Peak grad: 138.3 mmHg
S' Lateral: 2.2 cm

## 2021-12-13 ENCOUNTER — Encounter: Payer: Self-pay | Admitting: Cardiovascular Disease

## 2021-12-13 ENCOUNTER — Ambulatory Visit (INDEPENDENT_AMBULATORY_CARE_PROVIDER_SITE_OTHER): Payer: PPO | Admitting: Cardiovascular Disease

## 2021-12-13 ENCOUNTER — Other Ambulatory Visit: Payer: Self-pay

## 2021-12-13 VITALS — BP 108/50 | HR 66 | Ht <= 58 in | Wt 81.5 lb

## 2021-12-13 DIAGNOSIS — I272 Pulmonary hypertension, unspecified: Secondary | ICD-10-CM | POA: Diagnosis not present

## 2021-12-13 DIAGNOSIS — I890 Lymphedema, not elsewhere classified: Secondary | ICD-10-CM | POA: Diagnosis not present

## 2021-12-13 DIAGNOSIS — I5032 Chronic diastolic (congestive) heart failure: Secondary | ICD-10-CM

## 2021-12-13 DIAGNOSIS — I341 Nonrheumatic mitral (valve) prolapse: Secondary | ICD-10-CM

## 2021-12-13 DIAGNOSIS — I080 Rheumatic disorders of both mitral and aortic valves: Secondary | ICD-10-CM

## 2021-12-13 DIAGNOSIS — I10 Essential (primary) hypertension: Secondary | ICD-10-CM

## 2021-12-13 MED ORDER — TORSEMIDE 20 MG PO TABS: 40.0000 mg | ORAL_TABLET | Freq: Two times a day (BID) | ORAL | 3 refills | Status: AC

## 2021-12-13 NOTE — Patient Instructions (Addendum)
Medication Instructions:  Torsemide 40 mg  twice a day  May take extra 20 mg as needed  for shortness of breath  If you need a refill on your cardiac medications before your next appointment, please call your pharmacy.   Lab work: No new labs needed  Testing/Procedures: No new testing needed  Follow-Up: At [facility], you and your health needs are our priority.  As part of our continuing mission to provide you with exceptional heart care, we have created designated Provider Care Teams.  These Care Teams include your primary Cardiologist (physician) and Advanced Practice Providers (APPs -  Physician Assistants and Nurse Practitioners) who all work together to provide you with the care you need, when you need it.  You will need a follow up appointment in 6 months  Providers on your designated Care Team:   Murray Hodgkins, NP Christell Faith, PA-C Cadence Kathlen Mody, [place]  COVID-19 Vaccine Information can be found at: [email or web address] For questions related to vaccine distribution or appointments, please email [email or web address] or call [phone].

## 2021-12-13 NOTE — Progress Notes (Signed)
Cardiology Office Note  Date:  12/13/2021   ID:  Jennifer Skinner, DOB 07-Dec-1931, MRN [id number]  PCP:  Olin Hauser, DO   Chief Complaint  Patient presents with   2 month follow up     Follow up Echo results. Medications reviewed by the patient verbally.     HPI:  88 year old female with history of  mitral valve prolapse and regurgitation,  diastolic dysfunction,  COPD, and GERD,  hospital admission 10/10/2015 for acute respiratory distress with hypoxia and COPD exacerbation in the setting of right lower lobe PNA and acute on chronic diastolic CHF,  who presents today f/u for acute on chronic diastolic CHF  Last seen in clinic by myself September 2019 Was seen by one of our providers October 2022  Echocardiogram ordered at that time, performed November 21, 2021 Ejection fraction 55%, moderately reduced RV function with moderate pulmonary hypertension Severely dilated left atrium Severe MR, moderate TR Compared to prior echocardiogram she had moderate to severe MR, moderate TR in 2019  comorbid conditions including significant thoracic kyphosis, underweight status, advanced age, and underlying COPD.  In follow-up today reports that she feels fine Denies significant shortness of breath Compliant with her torsemide 40 twice daily, rarely needs to take extra  BMP reviewed, stable numbers Does not wear oxygen even though she knows that she should She does wear some oxygen at nighttime Because she does not feel short of breath in the day, has not been wearing it in the day  In the past has been seen by Dr. Raul Del, was on 3 L  Chronic back pain In the past on pain medication  Past medical history reviewed  Echo 01/07/18  EF of 55-60% along with moderate/severe MR, moderate TR and severely elevated PA pressure of 70 mm Hg. Echo done 10/11/15 showed an EF of 60-65% with moderate mitral regurgitation but without aortic stenosis. Mild to moderate TR,  PASP 56mg   Hg.  10/07/2015, CXR showed RLL PNA superimposed on COPD/emphysema, small right parapneumonic effusion, stable cardiomegaly without evidence of pulmonary edema. CBC unremarkable. She was diagnosed with a right lower lobe pneumonia at that time and started on Levaquin with outpatient follow up. She returned to the ED on 10/31 with increased SOB and leg swelling x 3 days. She initially felt the LEE was 2/2 Levaquin and the urgent care changed her to doxycycline over the phone. She was found to have a BNP of 1528 upon her arrival to [facility].    Echo showed normal LV systolic funation at 76-54%, normal wall motion, GR1DD, mitral valve prolasp with moderate regurgitation, mild to moderate TR, PASP 56 mm Hg.   PMH:   has a past medical history of 1st degree AV block, Arthritis, Chronic diastolic CHF (congestive heart failure) (HCC), COPD (chronic obstructive pulmonary disease) ([place]), Degenerative disc disease, lumbar, Gastro-esophageal reflux, Hyperlipidemia, Mitral prolapse, Mitral regurgitation, and Osteoporosis.  PSH:    Past Surgical History:  Procedure Laterality Date   ABDOMINAL HYSTERECTOMY     APPENDECTOMY     DILATION AND CURETTAGE OF UTERUS     HYSTEROTOMY     right breast biopsy     right eye      surgery   TONSILLECTOMY      Current Outpatient Medications  Medication Sig Dispense Refill   acetaminophen (TYLENOL) 500 MG tablet Take 500 mg by mouth every 6 (six) hours as needed.     aspirin EC 81 MG tablet Take 1 tablet (81 mg  total) by mouth daily. 90 tablet 3   Calcium Gluconate 500 MG CAPS Take 2 capsules by mouth daily.      gabapentin (NEURONTIN) 100 MG capsule Take 1 capsule (100 mg total) by mouth 3 (three) times daily. 270 capsule 3   levothyroxine (SYNTHROID) 25 MCG tablet Take 1 tablet (25 mcg total) by mouth daily before breakfast. 90 tablet 3   metoprolol tartrate (LOPRESSOR) 25 MG tablet Take 0.5 tablets (12.5 mg total) by mouth 2 (two) times daily. 90 tablet 3   mirabegron  ER (MYRBETRIQ) 25 MG TB24 tablet Take 1 tablet (25 mg total) by mouth daily. 30 tablet 5   Multiple Vitamin (MULTI-VITAMIN DAILY PO) Take 1 tablet by mouth daily.     Oyster Shell (OYSTER CALCIUM) 500 MG TABS Take 500 mg of elemental calcium by mouth 2 (two) times daily.     polyethylene glycol powder (GLYCOLAX/MIRALAX) powder Take 17-34 g by mouth daily as needed. 250 g 2   rosuvastatin (CRESTOR) 10 MG tablet Take 1 tablet (10 mg total) by mouth at bedtime. 90 tablet 3   sildenafil (REVATIO) 20 MG tablet Take 1 tablet (20 mg total) by mouth 3 (three) times daily. For pulmonary hypertension 90 tablet 2   torsemide (DEMADEX) 20 MG tablet Take 2 tablets (40 mg total) by mouth 2 (two) times daily. 360 tablet 0   traMADol (ULTRAM) 50 MG tablet Take 1-2 tablets (50-100 mg total) by mouth every 6 (six) hours as needed for moderate pain. Take up to max 6 pills per 24 hours. 180 tablet 0   No current facility-administered medications for this visit.     Allergies:   Penicillins   Social History:  The patient  reports that she has never smoked. She has never used smokeless tobacco. She reports that she does not drink alcohol and does not use drugs.   Family History:   family history includes CAD in her brother; CVA (age of onset: 72) in her mother; Heart attack (age of onset: 4) in her father; Throat cancer in her sister.    Review of Systems: Review of Systems  Constitutional: Negative.   Respiratory:  Positive for shortness of breath.   Cardiovascular: Negative.   Gastrointestinal: Negative.   Musculoskeletal:  Positive for back pain.       Gait instability  Neurological: Negative.   Psychiatric/Behavioral: Negative.    All other systems reviewed and are negative.   PHYSICAL EXAM: VS:  BP (!) 108/50 (BP Location: Left Arm, Patient Position: Sitting, Cuff Size: Normal)    Pulse 66    Ht 4\' 9"  (1.448 m)    Wt 81 lb 8 oz (37 kg)    SpO2 (!) 89%    BMI 17.64 kg/m  , BMI Body mass index is  17.64 kg/m. Constitutional:  oriented to person, place, and time. No distress.  Frail, thin Not on her oxygen HENT:  Head: Grossly normal Eyes:  no discharge. No scleral icterus.  Neck: No JVD, no carotid bruits  Cardiovascular: Regular rate and rhythm, no murmurs appreciated Pulmonary/Chest: Clear to auscultation bilaterally, no wheezes or rales Abdominal: Soft.  no distension.  no tenderness.  Musculoskeletal: Normal range of motion Neurological:  normal muscle tone. Coordination normal. No atrophy Skin: Skin warm and dry Psychiatric: normal affect, pleasant   Recent Labs: 10/04/2021: ALT 28; BUN 25; Creatinine, Ser 0.72; Potassium 4.7; Sodium 139    Lipid Panel Lab Results  Component Value Date   CHOL 129 08/26/2019  HDL 51 08/26/2019   LDLCALC 64 08/26/2019   TRIG 56 08/26/2019      Wt Readings from Last 3 Encounters:  12/13/21 81 lb 8 oz (37 kg)  11/16/21 80 lb 12.8 oz (36.7 kg)  11/06/21 80 lb (36.3 kg)     ASSESSMENT AND PLAN:  Chronic diastolic CHF (congestive heart failure) (HCC)/pulmonary hypertension Will continue torsemide 40 twice daily with extra torsemide as needed Echocardiogram results reviewed with her, she does not see any indication for procedures Would likely not be a good candidate for valve surgery She feels she is relatively asymptomatic, in fact not even on her oxygen today Recommend she consider wearing oxygen given saturations 89% that will likely drop with exertion  MITRAL REGURGITATION, severe Moderate to severe MR in 2019, no severe Not a good candidate for surgery, procedures, clip Asymptomatic, will continue to treat medically Above discussed with her, she is in agreement Extra torsemide as needed, normal renal function  gait instability No regular exercise program, no recent falls Weight trending downward Family present with her   Total encounter time more than 25 minutes  Greater than 50% was spent in counseling and  coordination of care with the patient    Signed, Esmond Plants, M.D., Ph.D. 12/13/2021  [place], [place] [phone]

## 2021-12-18 ENCOUNTER — Other Ambulatory Visit: Payer: Self-pay | Admitting: Family Medicine

## 2021-12-18 DIAGNOSIS — M48062 Spinal stenosis, lumbar region with neurogenic claudication: Secondary | ICD-10-CM

## 2021-12-18 DIAGNOSIS — M545 Low back pain, unspecified: Secondary | ICD-10-CM

## 2021-12-18 NOTE — Telephone Encounter (Signed)
Requested medications are due for refill today.  yes  Requested medications are on the active medications list.  yes  Last refill. 11/17/2021  Future visit scheduled.   yes  Notes to clinic.  Medication not delegated.    Requested Prescriptions  Pending Prescriptions Disp Refills   traMADol (ULTRAM) 50 MG tablet [Pharmacy Med Name: TRAMADOL HCL 50 MG TABLET] 180 tablet 0    Sig: Take 1-2 tablets (50-100 mg total) by mouth every 6 (six) hours as needed for moderate pain. Take up to max 6 pills per 24 hours.     Not Delegated - Analgesics:  Opioid Agonists Failed - 12/18/2021 10:17 AM      Failed - This refill cannot be delegated      Failed - Urine Drug Screen completed in last 360 days      Passed - Valid encounter within last 6 months    Recent Outpatient Visits           1 month ago Prolapsed internal hemorrhoids   [place], DO   2 months ago Slow transit constipation   [place], DO   3 months ago Slow transit constipation   [facility], Coralie Keens, NP   4 months ago Lumbar and sacral osteoarthritis   [place], DO   9 months ago Acute cystitis with hematuria   [facility] [place], Devonne Doughty, DO       Future Appointments             In 1 month [place], [facility], [place]   In 6 months  [facility], [place]

## 2022-02-01 ENCOUNTER — Other Ambulatory Visit: Payer: Self-pay | Admitting: Family Medicine

## 2022-02-01 DIAGNOSIS — M48062 Spinal stenosis, lumbar region with neurogenic claudication: Secondary | ICD-10-CM

## 2022-02-01 DIAGNOSIS — G8929 Other chronic pain: Secondary | ICD-10-CM

## 2022-02-01 DIAGNOSIS — M545 Low back pain, unspecified: Secondary | ICD-10-CM

## 2022-02-01 NOTE — Telephone Encounter (Signed)
Requested medication (s) are due for refill today - yes  Requested medication (s) are on the active medication list -yes  Future visit scheduled -yes  Last refill: 12/18/21 #180  Notes to clinic: Request RF: non delegated Rx  Requested Prescriptions  Pending Prescriptions Disp Refills   traMADol (ULTRAM) 50 MG tablet [Pharmacy Med Name: TRAMADOL HCL 50 MG TABLET] 180 tablet 0    Sig: Take 1-2 tablets (50-100 mg total) by mouth every 6 (six) hours as needed for moderate pain. Take up to max 6 pills per 24 hours.     Not Delegated - Analgesics:  Opioid Agonists Failed - 02/01/2022  9:08 AM      Failed - This refill cannot be delegated      Failed - Urine Drug Screen completed in last 360 days      Passed - Valid encounter within last 3 months    Recent Outpatient Visits           2 months ago Prolapsed internal hemorrhoids   [place], DO   4 months ago Slow transit constipation   [place], DO   5 months ago Slow transit constipation   [facility] [place], Coralie Keens, NP   6 months ago Lumbar and sacral osteoarthritis   [facility] Olin Hauser, DO   11 months ago Acute cystitis with hematuria   [place], DO       Future Appointments             In 5 days [place], Devonne Doughty, DO [facility], [place]   In 4 months  [facility], [facility]               Requested Prescriptions  Pending Prescriptions Disp Refills   traMADol (ULTRAM) 50 MG tablet [Pharmacy Med Name: TRAMADOL HCL 50 MG TABLET] 180 tablet 0    Sig: Take 1-2 tablets (50-100 mg total) by mouth every 6 (six) hours as needed for moderate pain. Take up to max 6 pills per 24 hours.     Not Delegated - Analgesics:  Opioid Agonists Failed - 02/01/2022  9:08 AM      Failed - This refill cannot be delegated       Failed - Urine Drug Screen completed in last 360 days      Passed - Valid encounter within last 3 months    Recent Outpatient Visits           2 months ago Prolapsed internal hemorrhoids   [place], DO   4 months ago Slow transit constipation   [place], DO   5 months ago Slow transit constipation   [facility] [place], Coralie Keens, NP   6 months ago Lumbar and sacral osteoarthritis   [place], DO   11 months ago Acute cystitis with hematuria   [place], DO       Future Appointments             In 5 days [place], Devonne Doughty, [facility], [place]   In 4 months  [facility], [place]

## 2022-02-06 ENCOUNTER — Encounter: Payer: Self-pay | Admitting: Family Medicine

## 2022-02-06 ENCOUNTER — Ambulatory Visit (INDEPENDENT_AMBULATORY_CARE_PROVIDER_SITE_OTHER): Payer: PPO | Admitting: Family Medicine

## 2022-02-06 ENCOUNTER — Other Ambulatory Visit: Payer: Self-pay

## 2022-02-06 ENCOUNTER — Telehealth: Payer: Self-pay

## 2022-02-06 VITALS — BP 96/34 | HR 61 | Ht <= 58 in | Wt 81.0 lb

## 2022-02-06 DIAGNOSIS — K121 Other forms of stomatitis: Secondary | ICD-10-CM

## 2022-02-06 DIAGNOSIS — K648 Other hemorrhoids: Secondary | ICD-10-CM | POA: Diagnosis not present

## 2022-02-06 NOTE — Patient Instructions (Addendum)
Thank you for coming to the office today.  Try the Peroxyl Oral Debridement Mouthwash (anti septic/anti bacterial and can cleanse and help it heal)  We will ask the GI doctors some questions about your hemorrhoid and see if they think they can help. If they can, then I will refer you - if they have further questions or can't - I will let you know.  [facility]) [place], [place] [address] Phone: [phone]   Please schedule a Follow-up Appointment to: Return if symptoms worsen or fail to improve.  If you have any other questions or concerns, please feel free to call the office or send a message through [place]. You may also schedule an earlier appointment if necessary.  Additionally, you may be receiving a survey about your experience at our office within a few days to 1 week by e-mail or mail. We value your feedback.  Nobie Putnam, DO [place]

## 2022-02-06 NOTE — Telephone Encounter (Signed)
-----   Message from Lin Landsman, MD sent at 02/06/2022  1:48 PM EST ----- No worries.  Happy to see her and try banding.  Reading from surgery note, her issue is rectal bleeding in addition to prolapse of the hemorrhoids.  Banding usually works for grade 2 hemorrhoids and partially successful for grade 3 hemorrhoids especially for the prolapse.  It is a low risk procedure and worth a try   Caryl Pina  Please schedule a banding visit for her ASAP, new pt  Thanks RV    ----- Message ----- From: Olin Hauser, DO Sent: 02/06/2022   1:29 PM EST To: Lin Landsman, MD  Dr Marius Ditch, I appreciate your time. Sorry for multiple requests today. This one is regarding this elderly frail 88 yr old patient of mine. She was seen by [facility] recently 11/2021 for cystocele / weak pelvic floor and also with large internal hemorrhoid protruding. The surgeon determined not candidate for intervention on the bladder. But she still would like to consider management of the large internal hemorrhoid. Would you be able to review her chart and prior Gen Surgery documentation etc and see if you think she may be a candidate for Internal Hemorrhoid Banding procedure? Let me know if you prefer me to send referral or if there is a different route you would advise. Just seeking some advice - Before referral to help make sure this is the right option for her. Thank you

## 2022-02-06 NOTE — Telephone Encounter (Signed)
Appointment made 02/26/22 arrived at 1:00pm

## 2022-02-06 NOTE — Progress Notes (Signed)
Subjective:    Patient ID: Jennifer Skinner, female    DOB: 07-Nov-1931, 88 y.o.   MRN: [id number]  Jennifer Skinner is a 88 y.o. female presenting on 02/06/2022 for Hemorrhoids   HPI  Internal Hemorrhoid protrusion Rectal Bleeding Has seen Gen Surgery Dr Christian Mate 11/2021 for evaluation of pelvic floor weakness with cystocele and bladder prolapse and large internal prolapsed hemorrhoids posteriorly The conclusion was that she would not be a good candidate for surgery and they were unable to help w the cystocele bladder protrusion w/ weak pelvic floor  She still asks for help with hemorrhoid with protrusion and rectal pressure interfering with bowel movements. Admits rectal bleeding from hemorrhoid  Oral Ulcer Reports ulcer inside lip, some have healed, now has used warm salt water to help it   Depression screen [facility] 2/9 06/20/2021 03/15/2020 12/08/2019  Decreased Interest 0 0 0  Down, Depressed, Hopeless 0 0 0  PHQ - 2 Score 0 0 0  Altered sleeping - - -  Tired, decreased energy - - -  Change in appetite - - -  Feeling bad or failure about yourself  - - -  Trouble concentrating - - -  Moving slowly or fidgety/restless - - -  Suicidal thoughts - - -  PHQ-9 Score - - -  Difficult doing work/chores - - -    Social History   Tobacco Use   Smoking status: Never   Smokeless tobacco: Never  Vaping Use   Vaping Use: Never used  Substance Use Topics   Alcohol use: No    Alcohol/week: 0.0 standard drinks   Drug use: No    Review of Systems Per HPI unless specifically indicated above     Objective:    BP (!) 96/34    Pulse 61    Ht 4\' 9"  (1.448 m)    Wt 81 lb (36.7 kg)    SpO2 99%    BMI 17.53 kg/m   Wt Readings from Last 3 Encounters:  02/06/22 81 lb (36.7 kg)  12/13/21 81 lb 8 oz (37 kg)  11/16/21 80 lb 12.8 oz (36.7 kg)    Physical Exam Vitals and nursing note reviewed.  Constitutional:      General: She is not in acute distress.    Appearance: Normal appearance.  She is well-developed. She is not diaphoretic.     Comments: Chronically ill, 88 yr female, mild discomfort with back chronic issue, cooperative  HENT:     Head: Normocephalic and atraumatic.  Eyes:     General:        Right eye: No discharge.        Left eye: No discharge.     Conjunctiva/sclera: Conjunctivae normal.  Cardiovascular:     Rate and Rhythm: Normal rate.  Pulmonary:     Effort: Pulmonary effort is normal.  Musculoskeletal:     Comments: Pronounced thoracic and spinal kyphosis. Chronic problem.  Skin:    General: Skin is warm and dry.     Findings: No erythema or rash.  Neurological:     Mental Status: She is alert and oriented to person, place, and time.  Psychiatric:        Mood and Affect: Mood normal.        Behavior: Behavior normal.        Thought Content: Thought content normal.     Comments: Well groomed, good eye contact, normal speech and thoughts     Results for orders placed or  performed in visit on 11/21/21  ECHOCARDIOGRAM COMPLETE  Result Value Ref Range   AV Peak grad 4.6 mmHg   Ao pk vel 1.07 m/s   S' Lateral 2.20 cm   Area-P 1/2 2.20 cm2   AV Mean grad 3.0 mmHg   MV M vel 5.88 m/s   MV Peak grad 138.3 mmHg      Assessment & Plan:   Problem List Items Addressed This Visit   None Visit Diagnoses     Prolapsed internal hemorrhoids    -  Primary   Oral ulcer         Try the Peroxyl Oral Debridement Mouthwash (anti septic/anti bacterial and can cleanse and help it heal)  We will ask the GI doctors some questions about your hemorrhoid and see if they think they can help. If they can, then I will refer you - if they have further questions or can't - I will let you know.  [facility]) [place] [address], [place] [address] Phone: [phone]   No orders of the defined types were placed in this encounter.     Follow up plan: Return if symptoms worsen or fail to improve.   Nobie Putnam, [facility] 02/06/2022, 10:38 AM

## 2022-02-14 ENCOUNTER — Ambulatory Visit: Payer: PPO | Admitting: Gastroenterology

## 2022-02-15 ENCOUNTER — Other Ambulatory Visit: Payer: Self-pay | Admitting: Family Medicine

## 2022-02-15 DIAGNOSIS — I10 Essential (primary) hypertension: Secondary | ICD-10-CM

## 2022-02-15 DIAGNOSIS — I272 Pulmonary hypertension, unspecified: Secondary | ICD-10-CM

## 2022-02-15 NOTE — Telephone Encounter (Signed)
Requested Prescriptions  ?Pending Prescriptions Disp Refills  ?? sildenafil (REVATIO) 20 MG tablet [Pharmacy Med Name: SILDENAFIL 20 MG TABLET] 90 tablet 0  ?  Sig: Take 1 tablet (20 mg total) by mouth 3 (three) times daily. For pulmonary hypertension  ?  ? Urology: Erectile Dysfunction Agents Failed - 02/15/2022  8:58 AM  ?  ?  Failed - Last BP in normal range  ?  BP Readings from Last 1 Encounters:  ?02/06/22 (!) 96/34  ?   ?  ?  Passed - AST in normal range and within 360 days  ?  AST  ?Date Value Ref Range Status  ?10/04/2021 33 0 - 40 IU/L Final  ?   ?  ?  Passed - ALT in normal range and within 360 days  ?  ALT  ?Date Value Ref Range Status  ?10/04/2021 28 0 - 32 IU/L Final  ?   ?  ?  Passed - Valid encounter within last 12 months  ?  Recent Outpatient Visits   ?      ? 1 week ago Prolapsed internal hemorrhoids  ? [place], DO  ? 3 months ago Prolapsed internal hemorrhoids  ? [place], DO  ? 4 months ago Slow transit constipation  ? [place], DO  ? 5 months ago Slow transit constipation  ? [facility] [place], [place], NP  ? 6 months ago Lumbar and sacral osteoarthritis  ? [place], DO  ?  ?  ?Future Appointments   ?        ? In 4 months [place]   ?  ? ?  ?  ?  ?? metoprolol tartrate (LOPRESSOR) 25 MG tablet [Pharmacy Med Name: METOPROLOL TARTRATE 25 MG TAB] 90 tablet 0  ?  Sig: Take 0.5 tablets (12.5 mg total) by mouth 2 (two)times daily.  ?  ? Cardiovascular:  Beta Blockers Failed - 02/15/2022  8:58 AM  ?  ?  Failed - Last BP in normal range  ?  BP Readings from Last 1 Encounters:  ?02/06/22 (!) 96/34  ?   ?  ?  Passed - Last Heart Rate in normal range  ?  Pulse Readings from Last 1 Encounters:  ?02/06/22 61  ?   ?  ?  Passed - Valid encounter within last 6 months  ?  Recent Outpatient Visits   ?       ? 1 week ago Prolapsed internal hemorrhoids  ? [place], DO  ? 3 months ago Prolapsed internal hemorrhoids  ? [place], DO  ? 4 months ago Slow transit constipation  ? [place], DO  ? 5 months ago Slow transit constipation  ? [facility] [place], [place], NP  ? 6 months ago Lumbar and sacral osteoarthritis  ? [place], DO  ?  ?  ?Future Appointments   ?        ? In 4 months [facility], [place]   ?  ? ?  ?  ?  ? ? ?

## 2022-02-26 ENCOUNTER — Ambulatory Visit (INDEPENDENT_AMBULATORY_CARE_PROVIDER_SITE_OTHER): Payer: PPO | Admitting: Gastroenterology

## 2022-02-26 ENCOUNTER — Other Ambulatory Visit: Payer: Self-pay

## 2022-02-26 ENCOUNTER — Encounter: Payer: Self-pay | Admitting: Gastroenterology

## 2022-02-26 VITALS — BP 118/64 | HR 74 | Temp 98.0°F | Ht <= 58 in | Wt 79.5 lb

## 2022-02-26 DIAGNOSIS — N8110 Cystocele, unspecified: Secondary | ICD-10-CM

## 2022-02-26 DIAGNOSIS — K623 Rectal prolapse: Secondary | ICD-10-CM

## 2022-02-26 NOTE — Progress Notes (Signed)
?  ?Cephas Darby, MD ?[address]  ?[address]  ?[place], [place] [address]  ?Main: [phone]  ?Fax: [phone] ? ? ? ?Gastroenterology Consultation ? ?Referring Provider:     Nobie Putnam * ?Primary Care Physician:  Olin Hauser, DO ?Primary Gastroenterologist:  Dr. Cephas Darby ?Reason for Consultation:  ?  Hemorrhoids ?      ? HPI:   ?Jennifer Skinner is a 88 y.o. female referred by Dr. Parks Ranger, Devonne Doughty, DO  for consultation & management of prolapsed hemorrhoids.  Patient was evaluated by Dr. Christian Mate on 2022 for bright red blood associated with hemorrhoids.  Per his note, patient had hemorrhoidal prolapse for years and she had discomfort and rectal bleeding.  Per his exam, patient was found to have hemorrhoidal prolapse posteriorly as well as cystocele anteriorly secondary to significant pelvic floor laxity and was recommended conservative management.  She was told that she is not a surgical candidate.  Patient came to see me today to discuss about nonsurgical options for treatment of her hemorrhoids.  Patient is accompanied by her son.  Patient states that she has a " big ball" hanging outside her tail and states " my entire tail is out" and also reports she has a bladder prolapse.  She reports that for last few months, it has been worsening.  Her main symptom is discomfort.  She denies any constipation or diarrhea.  She reports her stools are either formed or loose. ? ?Patient had hysterectomy several years ago ? ?NSAIDs: None ? ?Antiplts/Anticoagulants/Anti thrombotics: None ? ?GI Procedures: None ? ?Past Medical History:  ?Diagnosis Date  ? 1st degree AV block   ? Arthritis   ? Chronic diastolic CHF (congestive heart failure) ([place])   ? a. echo 2014: EF 55-60%, nl wall motion, GR1DD, mild MR, moder mitral prolapse, PASP 40 mm Hg; b. echo 09/2015: EF 60-65%, nl wall motion, GR1DD, mod MR, mild to mod TR, PASP 56 mm Hg  ? COPD (chronic obstructive pulmonary disease)  ([place])   ? Degenerative disc disease, lumbar   ? Gastro-esophageal reflux   ? Hyperlipidemia   ? Mitral prolapse   ? Mitral regurgitation   ? Osteoporosis   ? ? ?Past Surgical History:  ?Procedure Laterality Date  ? ABDOMINAL HYSTERECTOMY    ? APPENDECTOMY    ? DILATION AND CURETTAGE OF UTERUS    ? HYSTEROTOMY    ? right breast biopsy    ? right eye     ? surgery  ? TONSILLECTOMY    ? ?Current Outpatient Medications:  ?  acetaminophen (TYLENOL) 500 MG tablet, Take 500 mg by mouth every 6 (six) hours as needed., Disp: , Rfl:  ?  aspirin EC 81 MG tablet, Take 1 tablet (81 mg total) by mouth daily., Disp: 90 tablet, Rfl: 3 ?  Calcium Gluconate 500 MG CAPS, Take 2 capsules by mouth daily. , Disp: , Rfl:  ?  gabapentin (NEURONTIN) 100 MG capsule, Take 1 capsule (100 mg total) by mouth 3 (three) times daily., Disp: 270 capsule, Rfl: 3 ?  levothyroxine (SYNTHROID) 25 MCG tablet, Take 1 tablet (25 mcg total) by mouth daily before breakfast., Disp: 90 tablet, Rfl: 3 ?  metoprolol tartrate (LOPRESSOR) 25 MG tablet, Take 0.5 tablets (12.5 mg total) by mouth 2 (two)times daily., Disp: 90 tablet, Rfl: 0 ?  mirabegron ER (MYRBETRIQ) 25 MG TB24 tablet, Take 1 tablet (25 mg total) by mouth daily., Disp: 30 tablet, Rfl: 5 ?  polyethylene  glycol powder (GLYCOLAX/MIRALAX) powder, Take 17-34 g by mouth daily as needed., Disp: 250 g, Rfl: 2 ?  rosuvastatin (CRESTOR) 10 MG tablet, Take 1 tablet (10 mg total) by mouth at bedtime., Disp: 90 tablet, Rfl: 3 ?  sildenafil (REVATIO) 20 MG tablet, Take 1 tablet (20 mg total) by mouth 3 (three) times daily. For pulmonary hypertension, Disp: 90 tablet, Rfl: 2 ?  torsemide (DEMADEX) 20 MG tablet, Take 2 tablets (40 mg total) by mouth 2 (two) times daily. May take extra 20 mg as needed for shortness of breath, Disp: 400 tablet, Rfl: 3 ?  traMADol (ULTRAM) 50 MG tablet, Take 1-2 tablets (50-100 mg total) by mouth every 6 (six) hours as needed for moderate pain. Take up to max 6 pills per 24 hours.,  Disp: 180 tablet, Rfl: 2 ?  Multiple Vitamin (MULTI-VITAMIN DAILY PO), Take 1 tablet by mouth daily. (Patient not taking: Reported on 02/26/2022), Disp: , Rfl:  ?  O[place] (OYSTER CALCIUM) 500 MG TABS, Take 500 mg of elemental calcium by mouth 2 (two) times daily. (Patient not taking: Reported on 02/26/2022), Disp: , Rfl:  ? ?Family History  ?Problem Relation Age of Onset  ? CVA Mother 40  ? Heart attack Father 13  ? Throat cancer Sister   ? CAD Brother   ?  ? ?Social History  ? ?Tobacco Use  ? Smoking status: Never  ? Smokeless tobacco: Never  ?Vaping Use  ? Vaping Use: Never used  ?Substance Use Topics  ? Alcohol use: No  ?  Alcohol/week: 0.0 standard drinks  ? Drug use: No  ? ? ?Allergies as of 02/26/2022 - Review Complete 02/26/2022  ?Allergen Reaction Noted  ? Penicillins    ? ? ?Review of Systems:    ?All systems reviewed and negative except where noted in HPI. ? ? Physical Exam:  ?BP 118/64 (BP Location: Left Arm, Patient Position: Sitting, Cuff Size: Normal)   Pulse 74   Temp 98 ?F (36.7 ?C) (Oral)   Ht '4\' 9"'$  (1.448 m)   Wt 79 lb 8 oz (36.1 kg)   BMI 17.20 kg/m?  ?No LMP recorded. Patient has had a hysterectomy. ? ?General:   Alert, poorly nourished, thin built, pleasant and cooperative in NAD ?Head:  Normocephalic and atraumatic. ?Eyes:  Sclera clear, no icterus.   Conjunctiva pink. ?Ears:  Normal auditory acuity. ?Nose:  No deformity, discharge, or lesions. ?Mouth:  No deformity or lesions,oropharynx pink & moist. ?Neck:  Supple; no masses or thyromegaly. ?Lungs:  Respirations even and unlabored.  Clear throughout to auscultation.   No wheezes, crackles, or rhonchi. No acute distress. ?Heart:  Regular rate and rhythm; no murmurs, clicks, rubs, or gallops. ?Abdomen:  Normal bowel sounds. Soft, protuberant abdomen due to significant scoliosis of her back without masses, hepatosplenomegaly or hernias noted.  No guarding or rebound tenderness.   ?Rectal: Complete rectal prolapse as shown in the picture  taken in the office today ?Msk: Severe scoliosis, equal movement & strength bilaterally. ?Pulses:  Normal pulses noted. ?Extremities:  No clubbing or edema.  No cyanosis. ?Neurologic:  Alert and oriented x3;  grossly normal neurologically. ?Skin:  Intact without significant lesions or rashes. No jaundice. ?Psych:  Alert and cooperative. Normal mood and affect. ? ? ? ? ? ?Imaging Studies: ?Reviewed ? ?Assessment and Plan:  ? ?Jennifer Skinner is a 88 y.o. frail elderly female with history of hysterectomy, mild diastolic heart failure, first-degree AV block, hypertension, hypothyroidism is seen in consultation for  prolapsed hemorrhoids.  Apparently, patient has complete rectal prolapse and bladder prolapse.  Patient does not have prolapsed hemorrhoids. ?I have advised patient and her son that I will discuss with the colorectal surgeon about any possibility of surgical or nonsurgical interventions and then reach out to patient's son ? ? ?Follow up as needed ? ?Cephas Darby, MD ? ?

## 2022-02-26 NOTE — Progress Notes (Deleted)
?  ?Cephas Darby, MD ?[address]  ?[address]  ?[place], [place] [address]  ?Main: [phone]  ?Fax: [phone] ? ? ? ?Gastroenterology Consultation ? ?Referring Provider:     Nobie Putnam * ?Primary Care Physician:  Olin Hauser, DO ?Primary Gastroenterologist:  Dr. Cephas Darby ?Reason for Consultation:     *** ?      ? HPI:   ?Jennifer Skinner is a 88 y.o. female referred by Dr. Parks Ranger, Devonne Doughty, DO  for consultation & management of *** ? ?NSAIDs: *** ? ?Antiplts/Anticoagulants/Anti thrombotics: *** ? ?GI Procedures: *** ? ?Past Medical History:  ?Diagnosis Date  ? 1st degree AV block   ? Arthritis   ? Chronic diastolic CHF (congestive heart failure) ([place])   ? a. echo 2014: EF 55-60%, nl wall motion, GR1DD, mild MR, moder mitral prolapse, PASP 40 mm Hg; b. echo 09/2015: EF 60-65%, nl wall motion, GR1DD, mod MR, mild to mod TR, PASP 56 mm Hg  ? COPD (chronic obstructive pulmonary disease) ([place])   ? Degenerative disc disease, lumbar   ? Gastro-esophageal reflux   ? Hyperlipidemia   ? Mitral prolapse   ? Mitral regurgitation   ? Osteoporosis   ? ? ?Past Surgical History:  ?Procedure Laterality Date  ? ABDOMINAL HYSTERECTOMY    ? APPENDECTOMY    ? DILATION AND CURETTAGE OF UTERUS    ? HYSTEROTOMY    ? right breast biopsy    ? right eye     ? surgery  ? TONSILLECTOMY    ? ? ?Prior to Admission medications   ?Medication Sig Start Date End Date Taking? Authorizing Provider  ?acetaminophen (TYLENOL) 500 MG tablet Take 500 mg by mouth every 6 (six) hours as needed.   Yes [provider]  ?aspirin EC 81 MG tablet Take 1 tablet (81 mg total) by mouth daily. 08/18/18  Yes Minna Merritts, MD  ?Calcium Gluconate 500 MG CAPS Take 2 capsules by mouth daily.    Yes [provider]  ?gabapentin (NEURONTIN) 100 MG capsule Take 1 capsule (100 mg total) by mouth 3 (three) times daily. 07/25/21  Yes Karamalegos, Devonne Doughty, DO  ?levothyroxine (SYNTHROID) 25 MCG tablet Take  1 tablet (25 mcg total) by mouth daily before breakfast. 07/25/21  Yes Karamalegos, Devonne Doughty, DO  ?metoprolol tartrate (LOPRESSOR) 25 MG tablet Take 0.5 tablets (12.5 mg total) by mouth 2 (two)times daily. 02/15/22  Yes Karamalegos, Devonne Doughty, DO  ?mirabegron ER (MYRBETRIQ) 25 MG TB24 tablet Take 1 tablet (25 mg total) by mouth daily. 07/25/21  Yes Karamalegos, Devonne Doughty, DO  ?polyethylene glycol powder (GLYCOLAX/MIRALAX) powder Take 17-34 g by mouth daily as needed. 08/19/17  Yes Karamalegos, Devonne Doughty, DO  ?rosuvastatin (CRESTOR) 10 MG tablet Take 1 tablet (10 mg total) by mouth at bedtime. 07/25/21  Yes Karamalegos, Devonne Doughty, DO  ?sildenafil (REVATIO) 20 MG tablet Take 1 tablet (20 mg total) by mouth 3 (three) times daily. For pulmonary hypertension 02/15/22  Yes Karamalegos, Devonne Doughty, DO  ?torsemide (DEMADEX) 20 MG tablet Take 2 tablets (40 mg total) by mouth 2 (two) times daily. May take extra 20 mg as needed for shortness of breath 12/13/21  Yes Gollan, Kathlene November, MD  ?traMADol (ULTRAM) 50 MG tablet Take 1-2 tablets (50-100 mg total) by mouth every 6 (six) hours as needed for moderate pain. Take up to max 6 pills per 24 hours. 02/01/22  Yes Olin Hauser, DO  ?Multiple Vitamin (MULTI-VITAMIN DAILY  PO) Take 1 tablet by mouth daily. ?Patient not taking: Reported on 02/26/2022    [provider]  ?[name] (OYSTER CALCIUM) 500 MG TABS Take 500 mg of elemental calcium by mouth 2 (two) times daily. ?Patient not taking: Reported on 02/26/2022    [provider]  ? ? ?Family History  ?Problem Relation Age of Onset  ? CVA Mother 4  ? Heart attack Father 60  ? Throat cancer Sister   ? CAD Brother   ?  ? ?Social History  ? ?Tobacco Use  ? Smoking status: Never  ? Smokeless tobacco: Never  ?Vaping Use  ? Vaping Use: Never used  ?Substance Use Topics  ? Alcohol use: No  ?  Alcohol/week: 0.0 standard drinks  ? Drug use: No  ? ? ?Allergies as of 02/26/2022 - Review Complete 02/26/2022   ?Allergen Reaction Noted  ? Penicillins    ? ? ?Review of Systems:    ?All systems reviewed and negative except where noted in HPI. ? ? Physical Exam:  ?BP 118/64 (BP Location: Left Arm, Patient Position: Sitting, Cuff Size: Normal)   Pulse 74   Temp 98 ?F (36.7 ?C) (Oral)   Ht '4\' 9"'$  (1.448 m)   Wt 79 lb 8 oz (36.1 kg)   BMI 17.20 kg/m?  ?No LMP recorded. Patient has had a hysterectomy. ? ?General:   Alert,  Well-developed, well-nourished, pleasant and cooperative in NAD ?Head:  Normocephalic and atraumatic. ?Eyes:  Sclera clear, no icterus.   Conjunctiva pink. ?Ears:  Normal auditory acuity. ?Nose:  No deformity, discharge, or lesions. ?Mouth:  No deformity or lesions,oropharynx pink & moist. ?Neck:  Supple; no masses or thyromegaly. ?Lungs:  Respirations even and unlabored.  Clear throughout to auscultation.   No wheezes, crackles, or rhonchi. No acute distress. ?Heart:  Regular rate and rhythm; no murmurs, clicks, rubs, or gallops. ?Abdomen:  Normal bowel sounds. Soft, non-tender and non-distended without masses, hepatosplenomegaly or hernias noted.  No guarding or rebound tenderness.   ?Rectal: Not performed ?Msk:  Symmetrical without gross deformities. Good, equal movement & strength bilaterally. ?Pulses:  Normal pulses noted. ?Extremities:  No clubbing or edema.  No cyanosis. ?Neurologic:  Alert and oriented x3;  grossly normal neurologically. ?Skin:  Intact without significant lesions or rashes. No jaundice. ?Lymph Nodes:  No significant cervical adenopathy. ?Psych:  Alert and cooperative. Normal mood and affect. ? ?Imaging Studies: ?*** ? ?Assessment and Plan:  ? ?Jennifer Skinner is a 88 y.o. female with *** ? ? ?Follow up in *** ? ? ?Cephas Darby, MD ? ?

## 2022-03-02 ENCOUNTER — Other Ambulatory Visit: Payer: Self-pay | Admitting: Family Medicine

## 2022-03-02 DIAGNOSIS — M48062 Spinal stenosis, lumbar region with neurogenic claudication: Secondary | ICD-10-CM

## 2022-03-02 DIAGNOSIS — M545 Low back pain, unspecified: Secondary | ICD-10-CM

## 2022-03-05 NOTE — Telephone Encounter (Signed)
Requested medications are due for refill today.  No - too soon ? ?Requested medications are on the active medications list.  yes ? ?Last refill. 02/01/2022 #180 2 refills ? ?Future visit scheduled.   yes ? ?Notes to clinic.  Medication refill is not delegated. ? ? ? ?Requested Prescriptions  ?Pending Prescriptions Disp Refills  ? traMADol (ULTRAM) 50 MG tablet [Pharmacy Med Name: TRAMADOL HCL 50 MG TABLET] 180 tablet 0  ?  Sig: Take 1-2 tablets (50-100 mg total) by mouth every 6 (six) hours as needed for moderate pain. Take up to max 6 pills per 24 hours.  ?  ? Not Delegated - Analgesics:  Opioid Agonists Failed - 03/02/2022 12:31 PM  ?  ?  Failed - This refill cannot be delegated  ?  ?  Failed - Urine Drug Screen completed in last 360 days  ?  ?  Passed - Valid encounter within last 3 months  ?  Recent Outpatient Visits   ? ?      ? 3 weeks ago Prolapsed internal hemorrhoids  ? [place], DO  ? 3 months ago Prolapsed internal hemorrhoids  ? [place], DO  ? 5 months ago Slow transit constipation  ? [place], DO  ? 6 months ago Slow transit constipation  ? [facility] [place], [place] W, NP  ? 7 months ago Lumbar and sacral osteoarthritis  ? [place], DO  ? ?  ?  ?Future Appointments   ? ?        ? In 3 months [facility]   ? ?  ? ?  ?  ?  ?  ?

## 2022-03-13 ENCOUNTER — Telehealth: Payer: Self-pay | Admitting: Family Medicine

## 2022-03-13 NOTE — Telephone Encounter (Signed)
Copied from [place] [id number]. Topic: Referral - Request for Referral ?>> Mar 13, 2022 10:16 AM Leward Quan A wrote: ?Has patient seen PCP for this complaint? Yes.   ?*If NO, is insurance requiring patient see PCP for this issue before PCP can refer them? ?Referral for which specialty: Gastroenterology  ?Preferred provider/office: Dr Dawson Bills [facility] ) GI  ?Reason for referral: Colon issues ?

## 2022-03-13 NOTE — Telephone Encounter (Signed)
I am not sure what the referral request is for. ? ?She was seen by Dr Marius Ditch at [facility] recently for prolapsed rectum and bladder. She was advised to see a Colorectal Surgeon. ? ?This request is for general GI specialist again. Not colorectal surgery. ? ?Can you contact patient / son and clarify what the request is for and find out if they need me to refer to Colorectal surgeon? ? ?Those specialists are in bigger areas such as [place] or [place]. ? ?Nobie Putnam, DO ?[facility] ?[facility] ?03/13/2022, 12:01 PM ? ?

## 2022-03-15 ENCOUNTER — Telehealth: Payer: Self-pay | Admitting: Gastroenterology

## 2022-03-15 NOTE — Telephone Encounter (Signed)
Son, Jennifer Skinner calling today b/c they have not heard from anyone concerning specialist in [place].  He says the general surgeon she saw back in January advised he could not do anything due to pt's colon hanging out is the issue. ?Jennifer Skinner says they have not heard from anyone. ?He would like an update on what is going asap. ?

## 2022-03-15 NOTE — Telephone Encounter (Signed)
Pt calling, angry affect. States she has not seen "Anybody" about this issue and has not heard from anyone. Difficult to understand, pt yelling. Attempted to clarify what type of referral requested per Dr. Parks Ranger note of 03/13/22; unable to understand what pt is asking. Keeps repeating she has not seen anyone. Assured pt NT would route to practice for PCPs review. Advised phones were presently down. Pt verbalizes understanding.  ?Please advise. ?

## 2022-03-15 NOTE — Telephone Encounter (Signed)
Called her son, Elta Guadel[place] to ask about what referral they are needing. Left a message to return my call.. Once we know what referral needs to e placed, we will send one.  ?

## 2022-03-15 NOTE — Telephone Encounter (Signed)
Patients son left vm requesting a call back from the nurse with results from tests.  ?

## 2022-03-19 NOTE — Telephone Encounter (Signed)
Patient son is calling because at her last visit you had said you were going to contact a specialize in [place] about his mom condition to see if they could help her and they have not heard back yet  ?

## 2022-03-20 NOTE — Telephone Encounter (Signed)
Please inform patient's son that I was able to get in touch with the colorectal surgeon at [facility], Dr. Victorio Palm.  She said she can fix the rectal prolapse as well as get her connected to urogynecology to fix her bladder prolapse.  Dr. Girtha Rm nurse will call her son set up an appointment within the next 1 to 2 weeks.  If he does not hear back from [facility], have him call us back again ? ?Thanks ?RV ?

## 2022-03-21 NOTE — Telephone Encounter (Signed)
Patient verbalized understanding of instructions  

## 2022-04-05 ENCOUNTER — Other Ambulatory Visit: Payer: Self-pay | Admitting: Family Medicine

## 2022-04-05 DIAGNOSIS — M545 Low back pain, unspecified: Secondary | ICD-10-CM

## 2022-04-05 DIAGNOSIS — M48062 Spinal stenosis, lumbar region with neurogenic claudication: Secondary | ICD-10-CM

## 2022-04-06 NOTE — Telephone Encounter (Signed)
Requested medication (s) are due for refill today: yes ? ?Requested medication (s) are on the active medication list: yes ? ?Last refill:  03/05/22 #180 no RF ? ?Future visit scheduled: no ? ?Notes to clinic:  med not delegated to NT to RF ? ? ?Requested Prescriptions  ?Pending Prescriptions Disp Refills  ? traMADol (ULTRAM) 50 MG tablet [Pharmacy Med Name: TRAMADOL HCL 50 MG TABLET] 180 tablet 0  ?  Sig: Take 1-2 tablets (50-100 mg total) by mouth every 6 (six) hours as needed for moderate pain. Take up to max 6 pills per 24 hours.  ?  ? Not Delegated - Analgesics:  Opioid Agonists Failed - 04/05/2022 11:52 AM  ?  ?  Failed - This refill cannot be delegated  ?  ?  Failed - Urine Drug Screen completed in last 360 days  ?  ?  Passed - Valid encounter within last 3 months  ?  Recent Outpatient Visits   ? ?      ? 1 month ago Prolapsed internal hemorrhoids  ? [place], DO  ? 5 months ago Prolapsed internal hemorrhoids  ? [place], DO  ? 6 months ago Slow transit constipation  ? [place], DO  ? 7 months ago Slow transit constipation  ? [facility] [place], [place] W, NP  ? 8 months ago Lumbar and sacral osteoarthritis  ? [place], DO  ? ?  ?  ?Future Appointments   ? ?        ? In 2 months [facility], [place]   ? ?  ? ? ?  ?  ?  ? ? ? ? ?

## 2022-04-10 ENCOUNTER — Telehealth: Payer: Self-pay

## 2022-04-10 ENCOUNTER — Ambulatory Visit: Payer: Self-pay

## 2022-04-10 NOTE — Telephone Encounter (Signed)
?  Chief Complaint: Left knee pain ?Symptoms: 10/10 left knee pain ?Frequency: ongoing ?Pertinent Negatives: Patient denies Fever redness - does have bilateral swelling ?Disposition: '[]'$ ED /'[]'$ Urgent Care (no appt availability in office) / '[x]'$ Appointment(In office/virtual)/ '[]'$  [place] [facility]/ '[]'$ Home Care/ '[]'$ Refused Recommended Disposition /'[]'$ [place] [facility]/ '[]'$  Follow-up with PCP ?Additional Notes: Pt has left knee pain which is ongoing with out relief from pain medications. No known trama - pt has been ongoing for awhile. ?Reason for Disposition ? [1] MODERATE pain (e.g., interferes with normal activities, limping) AND [2] present > 3 days ? ?Answer Assessment - Initial Assessment Questions ?1. LOCATION and RADIATION: "Where is the pain located?"  ?    Left knee ?2. QUALITY: "What does the pain feel like?"  (e.g., sharp, dull, aching, burning) ?    Can't walk ?3. SEVERITY: "How bad is the pain?" "What does it keep you from doing?"   (Scale 1-10; or mild, moderate, severe) ?  -  MILD (1-3): doesn't interfere with normal activities  ?  -  MODERATE (4-7): interferes with normal activities (e.g., work or school) or awakens from sleep, limping  ?  -  SEVERE (8-10): excruciating pain, unable to do any normal activities, unable to walk ?    10/10 ?4. ONSET: "When did the pain start?" "Does it come and go, or is it there all the time?" ?    ongoing ?5. RECURRENT: "Have you had this pain before?" If Yes, ask: "When, and what happened then?" ?    Ongoing pain ?6. SETTING: "Has there been any recent work, exercise or other activity that involved that part of the body?"  ?    no ?7. AGGRAVATING FACTORS: "What makes the knee pain worse?" (e.g., walking, climbing stairs, running) ?    no ?8. ASSOCIATED SYMPTOMS: "Is there any swelling or redness of the knee?" ?    swelling ?9. OTHER SYMPTOMS: "Do you have any other symptoms?" (e.g., chest pain, difficulty breathing, fever, calf pain) ?    No ?10. PREGNANCY: "Is  there any chance you are pregnant?" "When was your last menstrual period?" ?      na ? ?Protocols used: Knee Pain-A-AH ? ?

## 2022-04-10 NOTE — Telephone Encounter (Signed)
Copied from [place]. Topic: General - Other ?>> Apr 10, 2022  1:24 PM Valere Dross wrote: ?Reason for CRM: Tammy from [facility] called in requesting if PCP would be pt attending physician, and if he agrees she has 6 months or least, please advise. ?

## 2022-04-10 NOTE — Telephone Encounter (Signed)
na

## 2022-04-10 NOTE — Telephone Encounter (Signed)
I can serve as her attending physician for [facility]. ? ?Nobie Putnam, DO ?[facility] ?[facility] ?04/10/2022, 3:16 PM ? ?

## 2022-04-10 NOTE — Telephone Encounter (Signed)
Jennifer Skinner has been notified.  ?

## 2022-04-11 ENCOUNTER — Telehealth: Payer: Self-pay | Admitting: Family Medicine

## 2022-04-11 DIAGNOSIS — I5032 Chronic diastolic (congestive) heart failure: Secondary | ICD-10-CM

## 2022-04-11 DIAGNOSIS — E43 Unspecified severe protein-calorie malnutrition: Secondary | ICD-10-CM

## 2022-04-11 DIAGNOSIS — J410 Simple chronic bronchitis: Secondary | ICD-10-CM

## 2022-04-11 DIAGNOSIS — M48062 Spinal stenosis, lumbar region with neurogenic claudication: Secondary | ICD-10-CM

## 2022-04-11 NOTE — Telephone Encounter (Signed)
Okay I will place referral to palliative. ? ?Nobie Putnam, DO ?[facility] ?[facility] ?04/11/2022, 4:35 PM ? ?

## 2022-04-11 NOTE — Telephone Encounter (Signed)
Kelly from [facility] called and stated that the Pt didn't start Hospice today and family is wanting a palliative order/ please advise  ?

## 2022-04-12 ENCOUNTER — Ambulatory Visit
Admission: RE | Admit: 2022-04-12 | Discharge: 2022-04-12 | Disposition: A | Discharge status: Home / Self Care | Payer: PPO | Source: Ambulatory Visit | Attending: Internal Medicine | Admitting: Internal Medicine

## 2022-04-12 ENCOUNTER — Encounter: Payer: Self-pay | Admitting: Internal Medicine

## 2022-04-12 ENCOUNTER — Ambulatory Visit (INDEPENDENT_AMBULATORY_CARE_PROVIDER_SITE_OTHER): Payer: PPO | Admitting: Internal Medicine

## 2022-04-12 ENCOUNTER — Ambulatory Visit
Admission: RE | Admit: 2022-04-12 | Discharge: 2022-04-12 | Disposition: A | Discharge status: Home / Self Care | Payer: PPO | Source: Home / Self Care | Attending: Internal Medicine | Admitting: Internal Medicine

## 2022-04-12 VITALS — BP 115/73 | HR 94 | Temp 97.5°F

## 2022-04-12 DIAGNOSIS — Z823 Family history of stroke: Secondary | ICD-10-CM | POA: Diagnosis not present

## 2022-04-12 DIAGNOSIS — I5032 Chronic diastolic (congestive) heart failure: Secondary | ICD-10-CM

## 2022-04-12 DIAGNOSIS — J9611 Chronic respiratory failure with hypoxia: Secondary | ICD-10-CM

## 2022-04-12 DIAGNOSIS — M25562 Pain in left knee: Secondary | ICD-10-CM

## 2022-04-12 DIAGNOSIS — I110 Hypertensive heart disease with heart failure: Secondary | ICD-10-CM | POA: Diagnosis not present

## 2022-04-12 DIAGNOSIS — R269 Unspecified abnormalities of gait and mobility: Secondary | ICD-10-CM | POA: Diagnosis not present

## 2022-04-12 DIAGNOSIS — Z880 Allergy status to penicillin: Secondary | ICD-10-CM | POA: Diagnosis not present

## 2022-04-12 DIAGNOSIS — M7989 Other specified soft tissue disorders: Secondary | ICD-10-CM | POA: Diagnosis not present

## 2022-04-12 DIAGNOSIS — J439 Emphysema, unspecified: Secondary | ICD-10-CM | POA: Diagnosis not present

## 2022-04-12 DIAGNOSIS — J8410 Pulmonary fibrosis, unspecified: Secondary | ICD-10-CM | POA: Diagnosis not present

## 2022-04-12 DIAGNOSIS — G629 Polyneuropathy, unspecified: Secondary | ICD-10-CM | POA: Diagnosis not present

## 2022-04-12 DIAGNOSIS — S82832A Other fracture of upper and lower end of left fibula, initial encounter for closed fracture: Secondary | ICD-10-CM | POA: Diagnosis not present

## 2022-04-12 DIAGNOSIS — J90 Pleural effusion, not elsewhere classified: Secondary | ICD-10-CM | POA: Diagnosis not present

## 2022-04-12 DIAGNOSIS — R7981 Abnormal blood-gas level: Secondary | ICD-10-CM

## 2022-04-12 DIAGNOSIS — R262 Difficulty in walking, not elsewhere classified: Secondary | ICD-10-CM | POA: Diagnosis not present

## 2022-04-12 DIAGNOSIS — J410 Simple chronic bronchitis: Secondary | ICD-10-CM | POA: Diagnosis not present

## 2022-04-12 DIAGNOSIS — K219 Gastro-esophageal reflux disease without esophagitis: Secondary | ICD-10-CM | POA: Diagnosis not present

## 2022-04-12 DIAGNOSIS — Z8249 Family history of ischemic heart disease and other diseases of the circulatory system: Secondary | ICD-10-CM | POA: Diagnosis not present

## 2022-04-12 DIAGNOSIS — G8929 Other chronic pain: Secondary | ICD-10-CM

## 2022-04-12 DIAGNOSIS — Z91199 Patient's noncompliance with other medical treatment and regimen due to unspecified reason: Secondary | ICD-10-CM | POA: Diagnosis not present

## 2022-04-12 DIAGNOSIS — I5033 Acute on chronic diastolic (congestive) heart failure: Secondary | ICD-10-CM | POA: Diagnosis not present

## 2022-04-12 DIAGNOSIS — X58XXXA Exposure to other specified factors, initial encounter: Secondary | ICD-10-CM | POA: Diagnosis present

## 2022-04-12 DIAGNOSIS — Z66 Do not resuscitate: Secondary | ICD-10-CM | POA: Diagnosis not present

## 2022-04-12 DIAGNOSIS — I509 Heart failure, unspecified: Secondary | ICD-10-CM | POA: Diagnosis not present

## 2022-04-12 DIAGNOSIS — I272 Pulmonary hypertension, unspecified: Secondary | ICD-10-CM | POA: Diagnosis not present

## 2022-04-12 DIAGNOSIS — Z7982 Long term (current) use of aspirin: Secondary | ICD-10-CM | POA: Diagnosis not present

## 2022-04-12 DIAGNOSIS — J9621 Acute and chronic respiratory failure with hypoxia: Secondary | ICD-10-CM | POA: Diagnosis not present

## 2022-04-12 DIAGNOSIS — M79605 Pain in left leg: Secondary | ICD-10-CM | POA: Diagnosis present

## 2022-04-12 DIAGNOSIS — E039 Hypothyroidism, unspecified: Secondary | ICD-10-CM | POA: Diagnosis not present

## 2022-04-12 DIAGNOSIS — Z9981 Dependence on supplemental oxygen: Secondary | ICD-10-CM | POA: Diagnosis not present

## 2022-04-12 DIAGNOSIS — E785 Hyperlipidemia, unspecified: Secondary | ICD-10-CM | POA: Diagnosis not present

## 2022-04-12 DIAGNOSIS — R0602 Shortness of breath: Secondary | ICD-10-CM | POA: Diagnosis not present

## 2022-04-12 DIAGNOSIS — Z79899 Other long term (current) drug therapy: Secondary | ICD-10-CM | POA: Diagnosis not present

## 2022-04-12 DIAGNOSIS — Z79890 Hormone replacement therapy (postmenopausal): Secondary | ICD-10-CM | POA: Diagnosis not present

## 2022-04-12 NOTE — Patient Instructions (Signed)
Knee Exercises ?Ask your health care provider which exercises are safe for you. Do exercises exactly as told by your health care provider and adjust them as directed. It is normal to feel mild stretching, pulling, tightness, or discomfort as you do these exercises. Stop right away if you feel sudden pain or your pain gets worse. Do not begin these exercises until told by your health care provider. ?Stretching and range-of-motion exercises ?These exercises warm up your muscles and joints and improve the movement and flexibility of your knee. These exercises also help to relieve pain and swelling. ?Knee extension, prone ? ?Lie on your abdomen (prone position) on a bed. ?Place your left / right knee just beyond the edge of the surface so your knee is not on the bed. You can put a towel under your left / right thigh just above your kneecap for comfort. ?Relax your leg muscles and allow gravity to straighten your knee (extension). You should feel a stretch behind your left / right knee. ?Hold this position for __________ seconds. ?Scoot up so your knee is supported between repetitions. ?Repeat __________ times. Complete this exercise __________ times a day. ?Knee flexion, active ? ?Lie on your back with both legs straight. If this causes back discomfort, bend your left / right knee so your foot is flat on the floor. ?Slowly slide your left / right heel back toward your buttocks. Stop when you feel a gentle stretch in the front of your knee or thigh (flexion). ?Hold this position for __________ seconds. ?Slowly slide your left / right heel back to the starting position. ?Repeat __________ times. Complete this exercise __________ times a day. ?Quadriceps stretch, prone ? ?Lie on your abdomen on a firm surface, such as a bed or padded floor. ?Bend your left / right knee and hold your ankle. If you cannot reach your ankle or pant leg, loop a belt around your foot and grab the belt instead. ?Gently pull your heel toward your  buttocks. Your knee should not slide out to the side. You should feel a stretch in the front of your thigh and knee (quadriceps). ?Hold this position for __________ seconds. ?Repeat __________ times. Complete this exercise __________ times a day. ?Hamstring, supine ? ?Lie on your back (supine position). ?Loop a belt or towel over the ball of your left / right foot. The ball of your foot is on the walking surface, right under your toes. ?Straighten your left / right knee and slowly pull on the belt to raise your leg until you feel a gentle stretch behind your knee (hamstring). ?Do not let your knee bend while you do this. ?Keep your other leg flat on the floor. ?Hold this position for __________ seconds. ?Repeat __________ times. Complete this exercise __________ times a day. ?Strengthening exercises ?These exercises build strength and endurance in your knee. Endurance is the ability to use your muscles for a long time, even after they get tired. ?Quadriceps, isometric ?This exercise strengthens the muscles in front of your thigh (quadriceps) without moving your knee joint (isometric). ?Lie on your back with your left / right leg extended and your other knee bent. Put a rolled towel or small pillow under your knee if told by your health care provider. ?Slowly tense the muscles in the front of your left / right thigh. You should see your kneecap slide up toward your hip or see increased dimpling just above the knee. This motion will push the back of the knee toward the floor. ?  For __________ seconds, hold the muscle as tight as you can without increasing your pain. ?Relax the muscles slowly and completely. ?Repeat __________ times. Complete this exercise __________ times a day. ?Straight leg raises ?This exercise strengthens the muscles in front of your thigh (quadriceps) and the muscles that move your hips (hip flexors). ?Lie on your back with your left / right leg extended and your other knee bent. ?Tense the  muscles in the front of your left / right thigh. You should see your kneecap slide up or see increased dimpling just above the knee. Your thigh may even shake a bit. ?Keep these muscles tight as you raise your leg 4-6 inches (10-15 cm) off the floor. Do not let your knee bend. ?Hold this position for __________ seconds. ?Keep these muscles tense as you lower your leg. ?Relax your muscles slowly and completely after each repetition. ?Repeat __________ times. Complete this exercise __________ times a day. ?Hamstring, isometric ? ?Lie on your back on a firm surface. ?Bend your left / right knee about __________ degrees. ?Dig your left / right heel into the surface as if you are trying to pull it toward your buttocks. Tighten the muscles in the back of your thighs (hamstring) to "dig" as hard as you can without increasing any pain. ?Hold this position for __________ seconds. ?Release the tension gradually and allow your muscles to relax completely for __________ seconds after each repetition. ?Repeat __________ times. Complete this exercise __________ times a day. ?Hamstring curls ?If told by your health care provider, do this exercise while wearing ankle weights. Begin with __________lb / kg weights. Then increase the weight by 1 lb (0.5 kg) increments. Do not wear ankle weights that are more than __________lb / kg. ?Lie on your abdomen with your legs straight. ?Bend your left / right knee as far as you can without feeling pain. Keep your hips flat against the floor. ?Hold this position for __________ seconds. ?Slowly lower your leg to the starting position. ?Repeat __________ times. Complete this exercise __________ times a day. ?Squats ?This exercise strengthens the muscles in front of your thigh and knee (quadriceps). ?Stand in front of a table, with your feet and knees pointing straight ahead. You may rest your hands on the table for balance but not for support. ?Slowly bend your knees and lower your hips like you  are going to sit in a chair. ?Keep your weight over your heels, not over your toes. ?Keep your lower legs upright so they are parallel with the table legs. ?Do not let your hips go lower than your knees. ?Do not bend lower than told by your health care provider. ?If your knee pain increases, do not bend as low. ?Hold the squat position for __________ seconds. ?Slowly push with your legs to return to standing. Do not use your hands to pull yourself to standing. ?Repeat __________ times. Complete this exercise __________ times a day. ?Wall slides ?This exercise strengthens the muscles in front of your thigh and knee (quadriceps). ?Lean your back against a smooth wall or door, and walk your feet out 18-24 inches (46-61 cm) from it. ?Place your feet hip-width apart. ?Slowly slide down the wall or door until your knees bend __________ degrees. Keep your knees over your heels, not over your toes. Keep your knees in line with your hips. ?Hold this position for __________ seconds. ?Repeat __________ times. Complete this exercise __________ times a day. ?Straight leg raises, side-lying ?This exercise strengthens the muscles that rotate   the leg at the hip and move it away from your body (hip abductors). ?Lie on your side with your left / right leg in the top position. Lie so your head, shoulder, knee, and hip line up. You may bend your bottom knee to help you keep your balance. ?Roll your hips slightly forward so your hips are stacked directly over each other and your left / right knee is facing forward. ?Leading with your heel, lift your top leg 4-6 inches (10-15 cm). You should feel the muscles in your outer hip lifting. ?Do not let your foot drift forward. ?Do not let your knee roll toward the ceiling. ?Hold this position for __________ seconds. ?Slowly return your leg to the starting position. ?Let your muscles relax completely after each repetition. ?Repeat __________ times. Complete this exercise __________ times a  day. ?Straight leg raises, prone ?This exercise stretches the muscles that move your hips away from the front of the pelvis (hip extensors). ?Lie on your abdomen on a firm surface. You can put a pillow under yo

## 2022-04-12 NOTE — Progress Notes (Signed)
? ?Subjective:  ? ? Patient ID: Jennifer Skinner, female    DOB: 1931-04-02, 88 y.o.   MRN: [id number] ? ?HPI ? ?Patient presents to clinic today with complaint of left knee pain.  This is a chronic issue that seems to be getting worse.  She describes the pain as sharp and stabbing only when she tries to ambulate.  She reports she has been unable to ambulate for the last 2 days.  She denies joint swelling.  She has a history of chronic back, hip and knee pain.  She is taking Tylenol, Gabapentin and Tramadol as needed with minimal relief of symptoms.  There is no recent knee imaging on file. ? ?Of note, her oxygen sats were 77% on room air.  She denies cough or feeling shortness of breath, chest pain.  She has a history of COPD, CH RF but does not use her oxygen as prescribed, CHF. ? ?Review of Systems ? ?Past Medical History:  ?Diagnosis Date  ? 1st degree AV block   ? Arthritis   ? Chronic diastolic CHF (congestive heart failure) ([place])   ? a. echo 2014: EF 55-60%, nl wall motion, GR1DD, mild MR, moder mitral prolapse, PASP 40 mm Hg; b. echo 09/2015: EF 60-65%, nl wall motion, GR1DD, mod MR, mild to mod TR, PASP 56 mm Hg  ? COPD (chronic obstructive pulmonary disease) ([place])   ? Degenerative disc disease, lumbar   ? Gastro-esophageal reflux   ? Hyperlipidemia   ? Mitral prolapse   ? Mitral regurgitation   ? Osteoporosis   ? ? ?Current Outpatient Medications  ?Medication Sig Dispense Refill  ? acetaminophen (TYLENOL) 500 MG tablet Take 500 mg by mouth every 6 (six) hours as needed.    ? aspirin EC 81 MG tablet Take 1 tablet (81 mg total) by mouth daily. 90 tablet 3  ? Calcium Gluconate 500 MG CAPS Take 2 capsules by mouth daily.     ? gabapentin (NEURONTIN) 100 MG capsule Take 1 capsule (100 mg total) by mouth 3 (three) times daily. 270 capsule 3  ? levothyroxine (SYNTHROID) 25 MCG tablet Take 1 tablet (25 mcg total) by mouth daily before breakfast. 90 tablet 3  ? metoprolol tartrate (LOPRESSOR) 25 MG tablet Take 0.5  tablets (12.5 mg total) by mouth 2 (two)times daily. 90 tablet 0  ? mirabegron ER (MYRBETRIQ) 25 MG TB24 tablet Take 1 tablet (25 mg total) by mouth daily. 30 tablet 5  ? Multiple Vitamin (MULTI-VITAMIN DAILY PO) Take 1 tablet by mouth daily. (Patient not taking: Reported on 02/26/2022)    ? Oyster Shell (OYSTER CALCIUM) 500 MG TABS Take 500 mg of elemental calcium by mouth 2 (two) times daily. (Patient not taking: Reported on 02/26/2022)    ? polyethylene glycol powder (GLYCOLAX/MIRALAX) powder Take 17-34 g by mouth daily as needed. 250 g 2  ? rosuvastatin (CRESTOR) 10 MG tablet Take 1 tablet (10 mg total) by mouth at bedtime. 90 tablet 3  ? sildenafil (REVATIO) 20 MG tablet Take 1 tablet (20 mg total) by mouth 3 (three) times daily. For pulmonary hypertension 90 tablet 2  ? torsemide (DEMADEX) 20 MG tablet Take 2 tablets (40 mg total) by mouth 2 (two) times daily. May take extra 20 mg as needed for shortness of breath 400 tablet 3  ? traMADol (ULTRAM) 50 MG tablet Take 1-2 tablets (50-100 mg total) by mouth every 6 (six) hours as needed for moderate pain. Take up to max 6 pills per 24 hours.  180 tablet 0  ? ?No current facility-administered medications for this visit.  ? ? ?Allergies  ?Allergen Reactions  ? Penicillins   ?  Rash   ? ? ?Family History  ?Problem Relation Age of Onset  ? CVA Mother 26  ? Heart attack Father 62  ? Throat cancer Sister   ? CAD Brother   ? ? ?Social History  ? ?Socioeconomic History  ? Marital status: Married  ?  Spouse name: Not on file  ? Number of children: Not on file  ? Years of education: Not on file  ? Highest education level: Not on file  ?Occupational History  ? Not on file  ?Tobacco Use  ? Smoking status: Never  ? Smokeless tobacco: Never  ?Vaping Use  ? Vaping Use: Never used  ?Substance and Sexual Activity  ? Alcohol use: No  ?  Alcohol/week: 0.0 standard drinks  ? Drug use: No  ? Sexual activity: Not Currently  ?Other Topics Concern  ? Not on file  ?Social History Narrative   ? Not on file  ? ?Social Determinants of Health  ? ?Financial Resource Strain: Low Risk   ? Difficulty of Paying Living Expenses: Not hard at all  ?Food Insecurity: No Food Insecurity  ? Worried About Charity fundraiser in the Last Year: Never true  ? Ran Out of Food in the Last Year: Never true  ?Transportation Needs: No Transportation Needs  ? Lack of Transportation (Medical): No  ? Lack of Transportation (Non-Medical): No  ?Physical Activity: Inactive  ? Days of Exercise per Week: 0 days  ? Minutes of Exercise per Session: 0 min  ?Stress: No Stress Concern Present  ? Feeling of Stress : Not at all  ?Social Connections: Not on file  ?Intimate Partner Violence: Not on file  ? ? ? ?Constitutional: Denies fever, malaise, fatigue, headache or abrupt weight changes.  ?HEENT: Denies eye pain, eye redness, ear pain, ringing in the ears, wax buildup, runny nose, nasal congestion, bloody nose, or sore throat. ?Respiratory: Denies difficulty breathing, shortness of breath, cough or sputum production.   ?Cardiovascular: Patient reports swelling in legs.  Denies chest pain, chest tightness, palpitations or swelling in the hands.  ?Musculoskeletal: Patient reports left knee pain, unable to ambulate.  Denies muscle pain or joint swelling.  ?Skin: Denies redness, rashes, lesions or ulcercations.  ?Neurological: Pt reports difficulty with balance. Denies dizziness, difficulty with memory, difficulty with speech or problems with coordination.  ? ? ?No other specific complaints in a complete review of systems (except as listed in HPI above). ? ?   ?Objective:  ? Physical Exam ? ? ?BP 115/73 (BP Location: Right Arm, Patient Position: Sitting, Cuff Size: Small)   Pulse 94   Temp (!) 97.5 ?F (36.4 ?C) (Temporal)  ? ?Wt Readings from Last 3 Encounters:  ?02/26/22 79 lb 8 oz (36.1 kg)  ?02/06/22 81 lb (36.7 kg)  ?12/13/21 81 lb 8 oz (37 kg)  ? ? ?General: Appears her stated age, chronically ill-appearing, in NAD. ?Skin: Warm, dry  and intact.  ?Cardiovascular: Normal rate and rhythm. S1,S2 noted.  No murmur, rubs or gallops noted. No JVD.  1+ pitting BLE edema.  ?Pulmonary/Chest: Normal effort and positive coarse breath sounds. No respiratory distress. No wheezes, rales or ronchi noted.  ?Musculoskeletal: Unable to fully extend the left knee.  Normal flexion.  Joint enlargement noted without swelling.  No pain with palpation of left knee.  She is able to stand and  bear some weight with assistance but not by her own.  She is in a wheelchair today. ?Neurological: Alert and oriented.  HOH. ? ? ?BMET ?   ?Component Value Date/Time  ? NA 139 10/04/2021 1113  ? NA 138 09/12/2013 2205  ? K 4.7 10/04/2021 1113  ? K 4.0 09/12/2013 2205  ? CL 96 10/04/2021 1113  ? CL 104 09/12/2013 2205  ? CO2 30 (H) 10/04/2021 1113  ? CO2 27 09/12/2013 2205  ? GLUCOSE 70 10/04/2021 1113  ? GLUCOSE 77 09/20/2020 1151  ? GLUCOSE 80 09/12/2013 2205  ? BUN 25 10/04/2021 1113  ? BUN 15 09/12/2013 2205  ? CREATININE 0.72 10/04/2021 1113  ? CREATININE 0.67 09/20/2020 1151  ? CALCIUM 8.7 10/04/2021 1113  ? CALCIUM 9.0 09/12/2013 2205  ? GFRNONAA 79 09/20/2020 1151  ? GFRAA 92 09/20/2020 1151  ? ? ?Lipid Panel  ?   ?Component Value Date/Time  ? CHOL 129 08/26/2019 1012  ? TRIG 56 08/26/2019 1012  ? HDL 51 08/26/2019 1012  ? CHOLHDL 2.5 08/26/2019 1012  ? VLDL 22 01/07/2018 2105  ? [place] 64 08/26/2019 1012  ? ? ?CBC ?   ?Component Value Date/Time  ? WBC 4.4 09/20/2020 1151  ? RBC 3.98 09/20/2020 1151  ? HGB 13.4 09/20/2020 1151  ? HGB 13.5 09/12/2013 2205  ? HCT 40.1 09/20/2020 1151  ? HCT 38.5 09/12/2013 2205  ? PLT 257 09/20/2020 1151  ? PLT 243 09/12/2013 2205  ? MCV 100.8 (H) 09/20/2020 1151  ? MCV 95 09/12/2013 2205  ? MCH 33.7 (H) 09/20/2020 1151  ? MCHC 33.4 09/20/2020 1151  ? RDW 11.9 09/20/2020 1151  ? RDW 13.2 09/12/2013 2205  ? LYMPHSABS 713 (L) 09/20/2020 1151  ? LYMPHSABS 1.3 09/12/2013 2205  ? MONOABS 0.8 01/07/2018 1042  ? MONOABS 1.0 (H) 09/12/2013 2205  ?  EOSABS 22 09/20/2020 1151  ? EOSABS 0.1 09/12/2013 2205  ? BASOSABS 40 09/20/2020 1151  ? BASOSABS 0.1 09/12/2013 2205  ? ? ?Hgb A1C ?Lab Results  ?Component Value Date  ? HGBA1C 5.6 08/26/2019  ? ? ? ? ? ? ?

## 2022-04-13 ENCOUNTER — Encounter: Payer: Self-pay | Admitting: Intensive Care

## 2022-04-13 ENCOUNTER — Emergency Department: Payer: PPO

## 2022-04-13 ENCOUNTER — Other Ambulatory Visit: Payer: Self-pay

## 2022-04-13 ENCOUNTER — Ambulatory Visit: Payer: Self-pay

## 2022-04-13 ENCOUNTER — Inpatient Hospital Stay
Admission: EM | Admit: 2022-04-13 | Discharge: 2022-04-16 | DRG: 563 | Disposition: A | Discharge status: Skilled Nursing Facility | Payer: PPO | Attending: Internal Medicine | Admitting: Internal Medicine

## 2022-04-13 ENCOUNTER — Telehealth: Payer: Self-pay | Admitting: *Deleted

## 2022-04-13 ENCOUNTER — Telehealth: Payer: Self-pay

## 2022-04-13 DIAGNOSIS — R262 Difficulty in walking, not elsewhere classified: Secondary | ICD-10-CM | POA: Diagnosis present

## 2022-04-13 DIAGNOSIS — M79605 Pain in left leg: Secondary | ICD-10-CM | POA: Diagnosis present

## 2022-04-13 DIAGNOSIS — Z79890 Hormone replacement therapy (postmenopausal): Secondary | ICD-10-CM | POA: Diagnosis not present

## 2022-04-13 DIAGNOSIS — Z9981 Dependence on supplemental oxygen: Secondary | ICD-10-CM | POA: Diagnosis not present

## 2022-04-13 DIAGNOSIS — Z7982 Long term (current) use of aspirin: Secondary | ICD-10-CM

## 2022-04-13 DIAGNOSIS — M48062 Spinal stenosis, lumbar region with neurogenic claudication: Secondary | ICD-10-CM

## 2022-04-13 DIAGNOSIS — Z66 Do not resuscitate: Secondary | ICD-10-CM | POA: Diagnosis present

## 2022-04-13 DIAGNOSIS — I5033 Acute on chronic diastolic (congestive) heart failure: Secondary | ICD-10-CM

## 2022-04-13 DIAGNOSIS — Z515 Encounter for palliative care: Secondary | ICD-10-CM | POA: Diagnosis not present

## 2022-04-13 DIAGNOSIS — Z880 Allergy status to penicillin: Secondary | ICD-10-CM | POA: Diagnosis not present

## 2022-04-13 DIAGNOSIS — Z91199 Patient's noncompliance with other medical treatment and regimen due to unspecified reason: Secondary | ICD-10-CM

## 2022-04-13 DIAGNOSIS — Z823 Family history of stroke: Secondary | ICD-10-CM | POA: Diagnosis not present

## 2022-04-13 DIAGNOSIS — J9621 Acute and chronic respiratory failure with hypoxia: Secondary | ICD-10-CM | POA: Diagnosis not present

## 2022-04-13 DIAGNOSIS — X58XXXA Exposure to other specified factors, initial encounter: Secondary | ICD-10-CM | POA: Diagnosis present

## 2022-04-13 DIAGNOSIS — I509 Heart failure, unspecified: Secondary | ICD-10-CM | POA: Diagnosis not present

## 2022-04-13 DIAGNOSIS — J8410 Pulmonary fibrosis, unspecified: Secondary | ICD-10-CM | POA: Diagnosis present

## 2022-04-13 DIAGNOSIS — Z8249 Family history of ischemic heart disease and other diseases of the circulatory system: Secondary | ICD-10-CM

## 2022-04-13 DIAGNOSIS — G629 Polyneuropathy, unspecified: Secondary | ICD-10-CM

## 2022-04-13 DIAGNOSIS — K219 Gastro-esophageal reflux disease without esophagitis: Secondary | ICD-10-CM | POA: Diagnosis present

## 2022-04-13 DIAGNOSIS — I272 Pulmonary hypertension, unspecified: Secondary | ICD-10-CM | POA: Diagnosis present

## 2022-04-13 DIAGNOSIS — J449 Chronic obstructive pulmonary disease, unspecified: Secondary | ICD-10-CM | POA: Diagnosis present

## 2022-04-13 DIAGNOSIS — J439 Emphysema, unspecified: Secondary | ICD-10-CM | POA: Diagnosis present

## 2022-04-13 DIAGNOSIS — S82832A Other fracture of upper and lower end of left fibula, initial encounter for closed fracture: Principal | ICD-10-CM | POA: Diagnosis present

## 2022-04-13 DIAGNOSIS — I5032 Chronic diastolic (congestive) heart failure: Secondary | ICD-10-CM | POA: Diagnosis present

## 2022-04-13 DIAGNOSIS — E785 Hyperlipidemia, unspecified: Secondary | ICD-10-CM

## 2022-04-13 DIAGNOSIS — E039 Hypothyroidism, unspecified: Secondary | ICD-10-CM | POA: Diagnosis present

## 2022-04-13 DIAGNOSIS — Z79899 Other long term (current) drug therapy: Secondary | ICD-10-CM

## 2022-04-13 DIAGNOSIS — M545 Low back pain, unspecified: Secondary | ICD-10-CM

## 2022-04-13 DIAGNOSIS — Z7189 Other specified counseling: Secondary | ICD-10-CM | POA: Diagnosis not present

## 2022-04-13 LAB — CBC WITH DIFFERENTIAL/PLATELET
Abs Immature Granulocytes: 0.02 10*3/uL (ref 0.00–0.07)
Basophils Absolute: 0 10*3/uL (ref 0.0–0.1)
Basophils Relative: 0 %
Eosinophils Absolute: 0.1 10*3/uL (ref 0.0–0.5)
Eosinophils Relative: 1 %
HCT: 40.6 % (ref 36.0–46.0)
Hemoglobin: 12.4 g/dL (ref 12.0–15.0)
Immature Granulocytes: 0 %
Lymphocytes Relative: 14 %
Lymphs Abs: 0.8 10*3/uL (ref 0.7–4.0)
MCH: 30.6 pg (ref 26.0–34.0)
MCHC: 30.5 g/dL (ref 30.0–36.0)
MCV: 100.2 fL — ABNORMAL HIGH (ref 80.0–100.0)
Monocytes Absolute: 0.5 10*3/uL (ref 0.1–1.0)
Monocytes Relative: 8 %
Neutro Abs: 4.4 10*3/uL (ref 1.7–7.7)
Neutrophils Relative %: 77 %
Platelets: 267 10*3/uL (ref 150–400)
RBC: 4.05 MIL/uL (ref 3.87–5.11)
RDW: 17.1 % — ABNORMAL HIGH (ref 11.5–15.5)
WBC: 5.8 10*3/uL (ref 4.0–10.5)
nRBC: 0 % (ref 0.0–0.2)

## 2022-04-13 LAB — COMPREHENSIVE METABOLIC PANEL
ALT: 24 U/L (ref 0–44)
AST: 32 U/L (ref 15–41)
Albumin: 2.8 g/dL — ABNORMAL LOW (ref 3.5–5.0)
Alkaline Phosphatase: 97 U/L (ref 38–126)
Anion gap: 8 (ref 5–15)
BUN: 41 mg/dL — ABNORMAL HIGH (ref 8–23)
CO2: 33 mmol/L — ABNORMAL HIGH (ref 22–32)
Calcium: 8.4 mg/dL — ABNORMAL LOW (ref 8.9–10.3)
Chloride: 98 mmol/L (ref 98–111)
Creatinine, Ser: 0.77 mg/dL (ref 0.44–1.00)
GFR, Estimated: >60 mL/min (ref >60)
Glucose, Bld: 78 mg/dL (ref 70–99)
Potassium: 4.2 mmol/L (ref 3.5–5.1)
Sodium: 139 mmol/L (ref 135–145)
Total Bilirubin: 0.5 mg/dL (ref 0.3–1.2)
Total Protein: 6.6 g/dL (ref 6.5–8.1)

## 2022-04-13 LAB — BASIC METABOLIC PANEL
BUN/Creatinine Ratio: 45 (calc) — ABNORMAL HIGH (ref 6–22)
BUN: 37 mg/dL — ABNORMAL HIGH (ref 7–25)
CO2: 31 mmol/L (ref 20–32)
Calcium: 8.1 mg/dL — ABNORMAL LOW (ref 8.6–10.4)
Chloride: 99 mmol/L (ref 98–110)
Creat: 0.83 mg/dL (ref 0.60–0.95)
Glucose, Bld: 144 mg/dL — ABNORMAL HIGH (ref 65–139)
Potassium: 3.4 mmol/L — ABNORMAL LOW (ref 3.5–5.3)
Sodium: 140 mmol/L (ref 135–146)

## 2022-04-13 LAB — PROTIME-INR
INR: 0.9 (ref 0.8–1.2)
Prothrombin Time: 12.3 seconds (ref 11.4–15.2)

## 2022-04-13 LAB — TROPONIN I (HIGH SENSITIVITY)
Troponin I (High Sensitivity): 52 ng/L — ABNORMAL HIGH (ref <18)
Troponin I (High Sensitivity): 47 ng/L — ABNORMAL HIGH (ref <18)

## 2022-04-13 LAB — BRAIN NATRIURETIC PEPTIDE: Brain Natriuretic Peptide: 1048 pg/mL — ABNORMAL HIGH (ref <100)

## 2022-04-13 MED ORDER — ENOXAPARIN SODIUM 40 MG/0.4ML IJ SOSY: 40.0000 mg | PREFILLED_SYRINGE | INTRAMUSCULAR | Status: DC

## 2022-04-13 MED ORDER — ACETAMINOPHEN 650 MG RE SUPP: 650.0000 mg | Freq: Four times a day (QID) | RECTAL | Status: DC | PRN

## 2022-04-13 MED ORDER — ONDANSETRON HCL 4 MG PO TABS
4.0000 mg | ORAL_TABLET | Freq: Four times a day (QID) | ORAL | Status: DC | PRN
Start: 2022-04-13 — End: 2022-04-16

## 2022-04-13 MED ORDER — ONDANSETRON HCL 4 MG/2ML IJ SOLN
4.0000 mg | Freq: Four times a day (QID) | INTRAMUSCULAR | Status: DC | PRN
  Filled 2022-04-13: qty 2

## 2022-04-13 MED ORDER — MAGNESIUM HYDROXIDE 400 MG/5ML PO SUSP: 30.0000 mL | Freq: Every day | ORAL | Status: DC | PRN

## 2022-04-13 MED ORDER — LEVOTHYROXINE SODIUM 50 MCG PO TABS
25.0000 ug | ORAL_TABLET | Freq: Every day | ORAL | Status: DC
  Administered 2022-04-14 – 2022-04-16 (×3): 25 ug via ORAL
  Filled 2022-04-13 (×4): qty 1

## 2022-04-13 MED ORDER — ENOXAPARIN SODIUM 30 MG/0.3ML IJ SOSY
30.0000 mg | PREFILLED_SYRINGE | INTRAMUSCULAR | Status: DC
  Administered 2022-04-13 – 2022-04-15 (×3): 30 mg via SUBCUTANEOUS
  Filled 2022-04-13 (×3): qty 0.3

## 2022-04-13 MED ORDER — TORSEMIDE 20 MG PO TABS
40.0000 mg | ORAL_TABLET | Freq: Two times a day (BID) | ORAL | Status: DC
Start: 2022-04-14 — End: 2022-04-13

## 2022-04-13 MED ORDER — METOPROLOL TARTRATE 25 MG PO TABS
12.5000 mg | ORAL_TABLET | Freq: Two times a day (BID) | ORAL | Status: DC
Start: 2022-04-13 — End: 2022-04-16
  Administered 2022-04-14 – 2022-04-15 (×3): 12.5 mg via ORAL
  Filled 2022-04-13 (×6): qty 1

## 2022-04-13 MED ORDER — POTASSIUM CHLORIDE CRYS ER 10 MEQ PO TBCR: 10.0000 meq | EXTENDED_RELEASE_TABLET | Freq: Two times a day (BID) | ORAL | 0 refills | Status: DC

## 2022-04-13 MED ORDER — ACETAMINOPHEN 325 MG PO TABS: 650.0000 mg | ORAL_TABLET | Freq: Four times a day (QID) | ORAL | Status: DC | PRN

## 2022-04-13 MED ORDER — MIRABEGRON ER 25 MG PO TB24
25.0000 mg | ORAL_TABLET | Freq: Every day | ORAL | Status: DC
  Administered 2022-04-14 – 2022-04-16 (×3): 25 mg via ORAL
  Filled 2022-04-13 (×3): qty 1

## 2022-04-13 MED ORDER — FUROSEMIDE 10 MG/ML IJ SOLN
40.0000 mg | Freq: Two times a day (BID) | INTRAMUSCULAR | Status: DC
  Administered 2022-04-13: 40 mg via INTRAVENOUS
  Filled 2022-04-13: qty 4

## 2022-04-13 MED ORDER — ROSUVASTATIN CALCIUM 10 MG PO TABS
10.0000 mg | ORAL_TABLET | Freq: Every day | ORAL | Status: DC
  Administered 2022-04-13 – 2022-04-15 (×3): 10 mg via ORAL
  Filled 2022-04-13 (×3): qty 1

## 2022-04-13 MED ORDER — TRAMADOL HCL 50 MG PO TABS
50.0000 mg | ORAL_TABLET | Freq: Four times a day (QID) | ORAL | Status: DC | PRN
  Administered 2022-04-14 – 2022-04-15 (×2): 50 mg via ORAL
  Filled 2022-04-13: qty 1
  Filled 2022-04-13: qty 2
  Filled 2022-04-13: qty 1

## 2022-04-13 MED ORDER — METOLAZONE 2.5 MG PO TABS: ORAL_TABLET | ORAL | 0 refills | Status: DC

## 2022-04-13 MED ORDER — GABAPENTIN 100 MG PO CAPS
100.0000 mg | ORAL_CAPSULE | Freq: Three times a day (TID) | ORAL | Status: DC
  Administered 2022-04-13 – 2022-04-16 (×8): 100 mg via ORAL
  Filled 2022-04-13 (×9): qty 1

## 2022-04-13 MED ORDER — TRAZODONE HCL 50 MG PO TABS: 25.0000 mg | ORAL_TABLET | Freq: Every evening | ORAL | Status: DC | PRN

## 2022-04-13 MED ORDER — SILDENAFIL CITRATE 20 MG PO TABS
20.0000 mg | ORAL_TABLET | Freq: Three times a day (TID) | ORAL | Status: DC
  Administered 2022-04-14 – 2022-04-16 (×7): 20 mg via ORAL
  Filled 2022-04-13 (×9): qty 1

## 2022-04-13 NOTE — Telephone Encounter (Signed)
Pt's son Elta Gu[place] advised.  He is going to take her to [facility] today.   ? ? ?Thanks,  ? ?-Mickel Baas  ?

## 2022-04-13 NOTE — Assessment & Plan Note (Signed)
-   We will continue Neurontin. ?

## 2022-04-13 NOTE — Telephone Encounter (Signed)
?  Chief Complaint: swelling and leaking fluid from lower legs ?Symptoms: swollen form knees down, skin tear (small) to calf area on left leg ?Frequency: days ?Pertinent Negatives: Patient denies chest pain, SOB, redness ?Disposition: '[x]'$ ED /'[]'$ Urgent Care (no appt availability in office) / '[]'$ Appointment(In office/virtual)/ '[]'$  [place] [facility]/ '[]'$ Home Care/ '[]'$ Refused Recommended Disposition /'[]'$ [place] [facility]/ '[]'$  Follow-up with PCP ?Additional Notes: Son tookpt to [facility] and was advised to call PCP due to edema and leaking fluids from lower legs= pt with H/O CHF advised son to take pt to ED for further evaluation. ? ? ? ? ? ? ? ? ? ?Reason for Disposition ? SEVERE leg swelling (e.g., swelling extends above knee, entire leg is swollen, weeping fluid) ? ?Answer Assessment - Initial Assessment Questions ?1. ONSET: "When did the swelling start?" (e.g., minutes, hours, days) ?    days ?2. LOCATION: "What part of the leg is swollen?"  "Are both legs swollen or just one leg?" ?    Both lower legs to feet ?3. SEVERITY: "How bad is the swelling?" (e.g., localized; mild, moderate, severe) ? - Localized - small area of swelling localized to one leg ? - MILD pedal edema - swelling limited to foot and ankle, pitting edema < 1/4 inch (6 mm) deep, rest and elevation eliminate most or all swelling ? - MODERATE edema - swelling of lower leg to knee, pitting edema > 1/4 inch (6 mm) deep, rest and elevation only partially reduce swelling ? - SEVERE edema - swelling extends above knee, facial or hand swelling present  ?    Weeping fluid mid lower leg down ?4. REDNESS: "Does the swelling look red or infected?" ?    no ?5. PAIN: "Is the swelling painful to touch?" If Yes, ask: "How painful is it?"   (Scale 1-10; mild, moderate or severe) ?    mild ? ?7. CAUSE: "What do you think is causing the leg swelling?" ?    Son doesn't know ?8. MEDICAL HISTORY: "Do you have a history of heart failure, kidney disease, liver  failure, or cancer?" ?    Heart CHF, lung ?9. RECURRENT SYMPTOM: "Have you had leg swelling before?" If Yes, ask: "When was the last time?" "What happened that time?" ?    yes ?10. OTHER SYMPTOMS: "Do you have any other symptoms?" (e.g., chest pain, difficulty breathing) ?      Third spacing denies SOB or chest pain skin tear back of calf (left) ?11. PREGNANCY: "Is there any chance you are pregnant?" "When was your last menstrual period?" ?      N/a ? ?Protocols used: Leg Swelling and Edema-A-AH ? ?

## 2022-04-13 NOTE — Progress Notes (Signed)
PHARMACIST - PHYSICIAN COMMUNICATION ? ?CONCERNING:  Enoxaparin (Lovenox) for DVT Prophylaxis  ? ? ?RECOMMENDATION: ?Patient was prescribed enoxaprin '40mg'$  q24 hours for VTE prophylaxis.  ? Danley Danker Weights  ? 04/13/22 1522  ?Weight: 31.8 kg (70 lb)  ? ? ?Body mass index is 16.27 kg/m?. ? ?Estimated Creatinine Clearance: 24.4 mL/min (by C-G formula based on SCr of 0.77 mg/dL). ? ? ?Patient is candidate for enoxaparin '30mg'$  every 24 hours based on CrCl <46m/min or Weight <45kg ? ?DESCRIPTION: ?Pharmacy has [facility]oxaparin dose per [facility]. ? ?Patient is now receiving enoxaparin 30 mg every 24 hours  ? ? ?ADarrick Penna PharmD ?Clinical Pharmacist  ?04/13/2022 ?7:19 PM ? ?

## 2022-04-13 NOTE — Assessment & Plan Note (Signed)
-   We will continue Revatio. ?

## 2022-04-13 NOTE — Assessment & Plan Note (Signed)
-   The patient will be placed on diuresis with IV Lasix. ?- We will follow serial troponins. ?

## 2022-04-13 NOTE — Assessment & Plan Note (Addendum)
-   This is likely secondary to acute on chronic Diastolic CHF. ?- O2 protocol will be followed ?

## 2022-04-13 NOTE — Assessment & Plan Note (Signed)
-   We will continue Synthroid. 

## 2022-04-13 NOTE — Assessment & Plan Note (Addendum)
-   This is secondary to left fibula fracture. ?- The patient will be admitted to a medical bed. ?- Pain management will be provided. ?- She will continue with knee immobilizer. ?- Dr. Sharlet Salina was notified and is aware of about the patient. ?- PT consult to be obtained. ?- Case management consult to be obtained for expected need for rehabilitation postdischarge. ?

## 2022-04-13 NOTE — ED Provider Notes (Signed)
As well ? ?[facility] ?Provider Note ? ? Event Date/Time  ? First MD Initiated Contact with Patient 04/13/22 1701   ?  (approximate) ?History  ?Leg Swelling ? ?HPI ?SAYGE BRIENZA is a 88 y.o. female with a past medical history of CHF who presents for left knee pain and leg swelling.  Patient states that she has had significant left lower extremity pain over the last 5 days that has recently gotten x-ray showing a fibular fracture.  Patient has been unable to ambulate or do most of her activities of daily living and has to rely on her son.  Son is very overwhelmed by this and feels that she would benefit from either inpatient or home health.  Patient denies any trauma to this left lower extremity or any distal numbness/weakness/paresthesias. ?Physical Exam  ?Triage Vital Signs: ?ED Triage Vitals  ?Enc Vitals Group  ?   BP 04/13/22 1520 (!) 102/56  ?   Pulse Rate 04/13/22 1520 67  ?   Resp 04/13/22 1520 (!) 24  ?   Temp 04/13/22 1520 98.6 ?F (37 ?C)  ?   Temp Source 04/13/22 1520 Oral  ?   SpO2 04/13/22 1520 (!) 66 %  ?   Weight 04/13/22 1522 70 lb (31.8 kg)  ?   Height 04/13/22 1522 '4\' 7"'$  (1.397 m)  ?   Head Circumference --   ?   Peak Flow --   ?   Pain Score 04/13/22 1521 6  ?   Pain Loc --   ?   Pain Edu? --   ?   Excl. in [place]? --   ? ?Most recent vital signs: ?Vitals:  ? 04/15/22 0801 04/15/22 1644  ?BP: (!) 146/111 129/65  ?Pulse: 80 71  ?Resp: 20 20  ?Temp: 97.6 ?F (36.4 ?C) 97.6 ?F (36.4 ?C)  ?SpO2: 91% 93%  ? ?General: Awake, oriented x4. ?CV:  Good peripheral perfusion.  ?Resp:  Normal effort.  ?Abd:  No distention.  ?Other:  Elderly Caucasian female laying in bed in no distress.  Tenderness to palpation over the fibular head on the left side ?ED Results / Procedures / Treatments  ?Labs ?(all labs ordered are listed, but only abnormal results are displayed) ?Labs Reviewed  ?CBC WITH DIFFERENTIAL/PLATELET - Abnormal; Notable for the following components:  ?    Result Value  ? MCV 100.2  (*)   ? RDW 17.1 (*)   ? All other components within normal limits  ?COMPREHENSIVE METABOLIC PANEL - Abnormal; Notable for the following components:  ? CO2 33 (*)   ? BUN 41 (*)   ? Calcium 8.4 (*)   ? Albumin 2.8 (*)   ? All other components within normal limits  ?BASIC METABOLIC PANEL - Abnormal; Notable for the following components:  ? Potassium 3.2 (*)   ? CO2 34 (*)   ? BUN 34 (*)   ? Calcium 8.2 (*)   ? All other components within normal limits  ?CBC - Abnormal; Notable for the following components:  ? MCV 100.7 (*)   ? RDW 16.8 (*)   ? All other components within normal limits  ?TROPONIN I (HIGH SENSITIVITY) - Abnormal; Notable for the following components:  ? Troponin I (High Sensitivity) 52 (*)   ? All other components within normal limits  ?TROPONIN I (HIGH SENSITIVITY) - Abnormal; Notable for the following components:  ? Troponin I (High Sensitivity) 47 (*)   ? All other components within normal limits  ?  PROTIME-INR  ? ?EKG ?ED ECG REPORT ?I, Naaman Plummer, the attending physician, personally viewed and interpreted this ECG. ?Date: 04/13/2022 ?EKG Time: 1523 ?Rate: 76 ?Rhythm: normal sinus rhythm ?QRS Axis: normal ?Intervals: normal ?ST/T Wave abnormalities: normal ?Narrative Interpretation: no evidence of acute ischemia ?RADIOLOGY ?ED MD interpretation: Single view portable chest x-ray interpreted by me and shows cardiomegaly, small left pleural effusion, and vascular congestion ?-Agree with radiology assessment ?Official radiology report(s): ?No results found. ?PROCEDURES: ?Critical Care performed: No ?.1-3 Lead EKG Interpretation ?Performed by: Naaman Plummer, MD ?Authorized by: Naaman Plummer, MD  ? ?  Interpretation: normal   ?  ECG rate:  72 ?  ECG rate assessment: normal   ?  Rhythm: sinus rhythm   ?  Ectopy: none   ?  Conduction: normal   ?MEDICATIONS ORDERED IN ED: ?Medications  ?acetaminophen (TYLENOL) tablet 650 mg (has no administration in time range)  ?  Or  ?acetaminophen (TYLENOL)  suppository 650 mg (has no administration in time range)  ?traZODone (DESYREL) tablet 25 mg (has no administration in time range)  ?magnesium hydroxide (MILK OF MAGNESIA) suspension 30 mL (has no administration in time range)  ?ondansetron (ZOFRAN) tablet 4 mg (has no administration in time range)  ?  Or  ?ondansetron (ZOFRAN) injection 4 mg (has no administration in time range)  ?enoxaparin (LOVENOX) injection 30 mg (30 mg Subcutaneous Given 04/14/22 2203)  ?traMADol (ULTRAM) tablet 50-100 mg (50 mg Oral Given 04/15/22 0922)  ?metoprolol tartrate (LOPRESSOR) tablet 12.5 mg (12.5 mg Oral Given 04/15/22 0923)  ?rosuvastatin (CRESTOR) tablet 10 mg (10 mg Oral Given 04/14/22 2201)  ?sildenafil (REVATIO) tablet 20 mg (20 mg Oral Given 04/15/22 1554)  ?levothyroxine (SYNTHROID) tablet 25 mcg (25 mcg Oral Given 04/15/22 0650)  ?mirabegron ER (MYRBETRIQ) tablet 25 mg (25 mg Oral Given 04/15/22 0923)  ?gabapentin (NEURONTIN) capsule 100 mg (100 mg Oral Given 04/15/22 1554)  ?torsemide (DEMADEX) tablet 40 mg (40 mg Oral Given 04/15/22 1705)  ?potassium chloride SA (KLOR-CON M) CR tablet 20 mEq (20 mEq Oral Given 04/15/22 0923)  ?alum & mag hydroxide-simeth (MAALOX/MYLANTA) 200-200-20 MG/5ML suspension 30 mL (30 mLs Oral Given 04/15/22 1427)  ? ?IMPRESSION / MDM / ASSESSMENT AND PLAN / ED COURSE  ?I reviewed the triage vital signs and the nursing notes. ?             ?               ?Differential diagnosis includes, but is not limited to, compartment syndrome, tibial fracture, fibula fracture, knee dislocation ?The patient is on the cardiac monitor to evaluate for evidence of arrhythmia and/or significant heart rate changes. ?Patient is an 88 year old female who presents for left lower extremity pain and was subsequently found to have a proximal fibula fracture.  Compartments are soft and there are no signs of arterial injury.  Patient does not know how this injury may have occurred and denies any known trauma to this area.  Patient does have a  small skin tear to the posterior aspect of the left calf that has some clear fluid that is likely interstitial fluid given patient's history of CHF and a small amount of peripheral edema.  Given patient's increased needs at home and decreased ability to perform her ADLs, she will require admission to the internal medicine service for further evaluation and management.  I spoke to the on-call hospitalist who agrees with this plan. ? ?Dispo: Admit to medicine ?  ?FINAL CLINICAL IMPRESSION(S) /  ED DIAGNOSES  ? ?Final diagnoses:  ?Closed fracture of proximal end of left fibula, unspecified fracture morphology, initial encounter  ? ?Rx / DC Orders  ? ?ED Discharge Orders   ? ? None  ? ?  ? ?Note:  This document was prepared using Dragon voice recognition software and may include unintentional dictation errors. ?  ?Naaman Plummer, MD ?04/15/22 2031 ? ?

## 2022-04-13 NOTE — Addendum Note (Signed)
Addended by: Jearld Fenton on: 04/13/2022 10:59 AM ? ? Modules accepted: Orders ? ?

## 2022-04-13 NOTE — Telephone Encounter (Signed)
FYI.... Pt is in the ER.  ? ? ? ?Thanks,  ? ?-Mickel Baas  ?

## 2022-04-13 NOTE — ED Triage Notes (Signed)
Patient was being seen at ortho today for displaced fracture involving the fibular head and neck on left leg. Son reports he brought patient to the ER for leg swelling and small laceration to back of left calf that is draining large amounts of fluid. During triage patient had noted oxygen levels of 66% on RA. Son reports patient lives at home alone and has not wore oxygen at home in a long time but has it. He is unsure what level of oxygen she wears. Reports the doctor told her last week she needs to wear it continuous but patient has no reason for not wearing oxygen. Patient and son report they need to speak with social work about options for help at home.  ?

## 2022-04-13 NOTE — ED Notes (Signed)
First Nurse Note:  Pt to ED via POV from [facility] for weeping from her leg ?

## 2022-04-13 NOTE — Telephone Encounter (Signed)
[facility]- Abnormal finding ? ?FINDINGS: ?Acute to subacute minimally displaced fracture involving the fibular ?head and neck. Soft tissue swelling. No dislocation ?  ?IMPRESSION: ?Acute to subacute minimally displaced fracture involving the fibular ?head and neck. ? ? ?

## 2022-04-13 NOTE — ED Notes (Signed)
First Nurse Note:  Pt son to desk requesting a cup of water for his mother. Pt son informed that she needs to wait until she is triaged and we know more about what is going on. Pt son states that pt has been eating and drinking all day. I told him that we understood that but we prefer that pts do not eat or drink until we know more about what going on with the pt. Pt son asked if he can given her some water, pt son informed that we advise him not to do this but we cannot stop him from giving her something.  ?

## 2022-04-13 NOTE — Assessment & Plan Note (Signed)
-   We will continue statin therapy. 

## 2022-04-13 NOTE — Telephone Encounter (Signed)
Copied from [place] [id number]. Topic: General - Other ?>> Apr 13, 2022  2:57 PM Tessa Lerner A wrote: ?Reason for CRM: Reason for CRM: The patient's son Elta Guadeloupe has called to request the prescription for a transport cart be sent to [facility] at [phone] ? ?Their phone number is [phone] ?

## 2022-04-13 NOTE — Addendum Note (Signed)
Addended by: Jearld Fenton on: 04/13/2022 11:36 AM ? ? Modules accepted: Orders ? ?

## 2022-04-13 NOTE — Telephone Encounter (Signed)
[facility]  ?L knee x ray-  ?

## 2022-04-13 NOTE — H&P (Signed)
?  ?  ?[name] ? ? ?PATIENT NAME: Jennifer Skinner   ? ?MR#:  [id number] ? ?DATE OF BIRTH:  May 11, 1931 ? ?DATE OF ADMISSION:  04/13/2022 ? ?PRIMARY CARE PHYSICIAN: Olin Hauser, DO  ? ?Patient is coming from: Home ? ?REQUESTING/REFERRING PHYSICIAN: Valora Piccolo, MD ? ?CHIEF COMPLAINT:  ? ?Chief Complaint  ?Patient presents with  ? Leg Swelling  ? ? ?HISTORY OF PRESENT ILLNESS:  ?Jennifer Skinner is a 88 y.o. Caucasian female with medical history significant for COPD, GERD, dyslipidemia mitral valve prolapse and regurgitation, osteoporosis and lumbar degenerative disc disease, who presented to the ER with acute onset of left leg swelling and pain.  She is not sure if she fell or hit her left leg.  She has not been able to ambulate secondarily.  She lives alone.  She is on home O2 at 3 L/min and her pulse oximetry has been dropping.  She denies any significant worsening dyspnea or orthopnea or paroxysmal nocturnal dyspnea.  No fever or chills.  She denies any cough or wheezing.  She required 4 L of O2 to get her pulse oximetry up to 92%.  No dysuria, oliguria, hematuria, urgency or frequency or flank pain. ? ?ED Course: Upon presentation to the emergency room, pulse oximetry was 60% on room air and 100% on 4 L of O2 by nasal cannula respiratory rate was 24 and otherwise vital signs were within normal.  Labs revealed CO2 of 33 and BUN 41 and albumin 2.8 with otherwise unremarkable CMP.  High-sensitivity troponin I was 52 and later 47.  CBC showed macrocytosis. ?EKG as reviewed by me : EKG showed normal sinus rhythm with a rate of 76 with PACs with possible RVH and T wave inversion inferiorly. ?Imaging: Portable chest x-ray showed: ?1. Chronic cardiomegaly. Increased right pleural effusion and ?basilar opacity from 2019 exam, likely combination of pleural fluid ?and atelectasis. ?2. Small left pleural effusion with streaky left lung base ?opacities, likely atelectasis. ?3. Vascular congestion with possible  central perihilar edema. ? ?The patient will be admitted to a medical bed for further evaluation and management. ? ?PAST MEDICAL HISTORY:  ? ?Past Medical History:  ?Diagnosis Date  ? 1st degree AV block   ? Arthritis   ? Chronic diastolic CHF (congestive heart failure) ([place])   ? a. echo 2014: EF 55-60%, nl wall motion, GR1DD, mild MR, moder mitral prolapse, PASP 40 mm Hg; b. echo 09/2015: EF 60-65%, nl wall motion, GR1DD, mod MR, mild to mod TR, PASP 56 mm Hg  ? COPD (chronic obstructive pulmonary disease) ([place])   ? Degenerative disc disease, lumbar   ? Gastro-esophageal reflux   ? Hyperlipidemia   ? Mitral prolapse   ? Mitral regurgitation   ? Osteoporosis   ? ? ?PAST SURGICAL HISTORY:  ? ?Past Surgical History:  ?Procedure Laterality Date  ? ABDOMINAL HYSTERECTOMY    ? APPENDECTOMY    ? DILATION AND CURETTAGE OF UTERUS    ? HYSTEROTOMY    ? right breast biopsy    ? right eye     ? surgery  ? TONSILLECTOMY    ? ? ?SOCIAL HISTORY:  ? ?Social History  ? ?Tobacco Use  ? Smoking status: Never  ? Smokeless tobacco: Never  ?Substance Use Topics  ? Alcohol use: No  ?  Alcohol/week: 0.0 standard drinks  ? ? ?FAMILY HISTORY:  ? ?Family History  ?Problem Relation Age of Onset  ? CVA Mother 44  ? Heart attack Father  43  ? Throat cancer Sister   ? CAD Brother   ? ? ?DRUG ALLERGIES:  ? ?Allergies  ?Allergen Reactions  ? Penicillins   ?  Rash   ? ? ?REVIEW OF SYSTEMS:  ? ?ROS ?As per history of present illness. All pertinent systems were reviewed above. Constitutional, HEENT, cardiovascular, respiratory, GI, GU, musculoskeletal, neuro, psychiatric, endocrine, integumentary and hematologic systems were reviewed and are otherwise negative/unremarkable except for positive findings mentioned above in the HPI. ? ? ?MEDICATIONS AT HOME:  ? ?Prior to Admission medications   ?Medication Sig Start Date End Date Taking? Authorizing Provider  ?acetaminophen (TYLENOL) 500 MG tablet Take 500 mg by mouth every 6 (six) hours as needed.   Yes  [provider]  ?aspirin EC 81 MG tablet Take 81 mg by mouth daily. 08/18/18  Yes Minna Merritts, MD  ?gabapentin (NEURONTIN) 100 MG capsule Take 1 capsule (100 mg total) by mouth 3 (three) times daily. 07/25/21  Yes Karamalegos, Devonne Doughty, DO  ?levothyroxine (SYNTHROID) 25 MCG tablet Take 1 tablet (25 mcg total) by mouth daily before breakfast. 07/25/21  Yes Karamalegos, Devonne Doughty, DO  ?metoprolol tartrate (LOPRESSOR) 25 MG tablet Take 0.5 tablets (12.5 mg total) by mouth 2 (two)times daily. 02/15/22  Yes Karamalegos, Devonne Doughty, DO  ?mirabegron ER (MYRBETRIQ) 25 MG TB24 tablet Take 1 tablet (25 mg total) by mouth daily. 07/25/21  Yes Karamalegos, Devonne Doughty, DO  ?polyethylene glycol powder (GLYCOLAX/MIRALAX) powder Take 17-34 g by mouth daily as needed. 08/19/17  Yes Karamalegos, Devonne Doughty, DO  ?rosuvastatin (CRESTOR) 10 MG tablet Take 1 tablet (10 mg total) by mouth at bedtime. 07/25/21  Yes Karamalegos, Devonne Doughty, DO  ?sildenafil (REVATIO) 20 MG tablet Take 1 tablet (20 mg total) by mouth 3 (three) times daily. For pulmonary hypertension 02/15/22  Yes Karamalegos, Devonne Doughty, DO  ?torsemide (DEMADEX) 20 MG tablet Take 2 tablets (40 mg total) by mouth 2 (two) times daily. May take extra 20 mg as needed for shortness of breath 12/13/21  Yes Gollan, Kathlene November, MD  ?traMADol (ULTRAM) 50 MG tablet Take 1-2 tablets (50-100 mg total) by mouth every 6 (six) hours as needed for moderate pain. Take up to max 6 pills per 24 hours. 04/06/22  Yes Olin Hauser, DO  ?Calcium Gluconate 500 MG CAPS Take 2 capsules by mouth daily.  ?Patient not taking: Reported on 04/12/2022    [provider]  ?metolazone (ZAROXOLYN) 2.5 MG tablet Take 1 tab today and 1 tab on Sunday ?Patient not taking: Reported on 04/13/2022 04/13/22   Jearld Fenton, NP  ?Multiple Vitamin (MULTI-VITAMIN DAILY PO) Take 1 tablet by mouth daily. ?Patient not taking: Reported on 04/12/2022    [provider]  ?[name] (OYSTER  CALCIUM) 500 MG TABS Take 500 mg of elemental calcium by mouth 2 (two) times daily. ?Patient not taking: Reported on 04/12/2022    [provider]  ?potassium chloride (KLOR-CON M) 10 MEQ tablet Take 1 tablet (10 mEq total) by mouth 2 (two) times daily. ?Patient not taking: Reported on 04/13/2022 04/13/22   Jearld Fenton, NP  ? ?  ? ?VITAL SIGNS:  ?Blood pressure (!) 109/46, pulse 85, temperature 98.6 ?F (37 ?C), resp. rate (!) 22, height '4\' 7"'$  (1.397 m), weight 31.8 kg, SpO2 93 %. ? ?PHYSICAL EXAMINATION:  ?Physical Exam ? ?GENERAL:  88 y.o.-year-old Caucasian female patient lying in the bed with mild respiratory distress with conversational dyspnea ?EYES: Pupils equal, round, reactive to  light and accommodation. No scleral icterus. Extraocular muscles intact.  ?HEENT: Head atraumatic, normocephalic. Oropharynx and nasopharynx clear.  ?NECK:  Supple, no jugular venous distention. No thyroid enlargement, no tenderness.  ?LUNGS: Slightly diminished bibasilar breath sounds with minimal bibasal rales.  No use of accessory muscles of respiration.  ?CARDIOVASCULAR: Regular rate and rhythm, S1, S2 normal. No murmurs, rubs, or gallops.  ?ABDOMEN: Soft, nondistended, nontender. Bowel sounds present. No organomegaly or mass.  ?EXTREMITIES: No pedal edema, cyanosis, or clubbing.  ?NEUROLOGIC: Cranial nerves II through XII are intact. Muscle strength 5/5 in all extremities. Sensation intact. Gait not checked. ?Musculoskeletal: Left lower extremity swelling compared to the right with lateral upper leg tenderness. ?PSYCHIATRIC: The patient is alert and oriented x 3.  Normal affect and good eye contact. ?SKIN: No obvious rash, lesion, or ulcer.  ? ?LABORATORY PANEL:  ? ?CBC ?Recent Labs  ?Lab 04/13/22 ?1529  ?WBC 5.8  ?HGB 12.4  ?HCT 40.6  ?PLT 267  ? ?------------------------------------------------------------------------------------------------------------------ ? ?Chemistries  ?Recent Labs  ?Lab 04/13/22 ?1529  ?NA 139   ?K 4.2  ?CL 98  ?CO2 33*  ?GLUCOSE 78  ?BUN 41*  ?CREATININE 0.77  ?CALCIUM 8.4*  ?AST 32  ?ALT 24  ?ALKPHOS 97  ?BILITOT 0.5  ? ?----------------------------------------------------------------------

## 2022-04-13 NOTE — Assessment & Plan Note (Signed)
-   We will place on DuoNebs as needed. ?

## 2022-04-14 DIAGNOSIS — R262 Difficulty in walking, not elsewhere classified: Secondary | ICD-10-CM | POA: Diagnosis not present

## 2022-04-14 LAB — BASIC METABOLIC PANEL
Anion gap: 6 (ref 5–15)
BUN: 34 mg/dL — ABNORMAL HIGH (ref 8–23)
CO2: 34 mmol/L — ABNORMAL HIGH (ref 22–32)
Calcium: 8.2 mg/dL — ABNORMAL LOW (ref 8.9–10.3)
Chloride: 101 mmol/L (ref 98–111)
Creatinine, Ser: 0.66 mg/dL (ref 0.44–1.00)
GFR, Estimated: >60 mL/min (ref >60)
Glucose, Bld: 78 mg/dL (ref 70–99)
Potassium: 3.2 mmol/L — ABNORMAL LOW (ref 3.5–5.1)
Sodium: 141 mmol/L (ref 135–145)

## 2022-04-14 LAB — CBC
HCT: 41.6 % (ref 36.0–46.0)
Hemoglobin: 12.6 g/dL (ref 12.0–15.0)
MCH: 30.5 pg (ref 26.0–34.0)
MCHC: 30.3 g/dL (ref 30.0–36.0)
MCV: 100.7 fL — ABNORMAL HIGH (ref 80.0–100.0)
Platelets: 251 10*3/uL (ref 150–400)
RBC: 4.13 MIL/uL (ref 3.87–5.11)
RDW: 16.8 % — ABNORMAL HIGH (ref 11.5–15.5)
WBC: 5 10*3/uL (ref 4.0–10.5)
nRBC: 0 % (ref 0.0–0.2)

## 2022-04-14 MED ORDER — POTASSIUM CHLORIDE CRYS ER 10 MEQ PO TBCR: 10.0000 meq | EXTENDED_RELEASE_TABLET | Freq: Two times a day (BID) | ORAL | Status: DC

## 2022-04-14 MED ORDER — POTASSIUM CHLORIDE CRYS ER 20 MEQ PO TBCR
20.0000 meq | EXTENDED_RELEASE_TABLET | Freq: Two times a day (BID) | ORAL | Status: DC
  Administered 2022-04-14 – 2022-04-16 (×4): 20 meq via ORAL
  Filled 2022-04-14 (×5): qty 1

## 2022-04-14 MED ORDER — TORSEMIDE 20 MG PO TABS
40.0000 mg | ORAL_TABLET | Freq: Two times a day (BID) | ORAL | Status: DC
Start: 2022-04-14 — End: 2022-04-16
  Administered 2022-04-14 – 2022-04-16 (×5): 40 mg via ORAL
  Filled 2022-04-14 (×5): qty 2

## 2022-04-14 NOTE — [place] FL2 (Signed)
?[place] MEDICAID FL2 LEVEL OF CARE SCREENING TOOL  ?  ? ?IDENTIFICATION  ?Patient Name: ?Jennifer Skinner Birthdate: 10/07/1931 Sex: female Admission Date (Current Location): ?04/13/2022  ?[place] and [place] Number: ? [place] ?  Facility and Address:  ?[facility], [address] [place], [place] [address] ?     Provider Number: ?[id number]  ?Attending Physician Name and Address:  ?Fritzi Mandes, MD ? Relative Name and Phone Number:  ?Tameyah, Koch) [phone] ?   ?Current Level of Care: ?Hospital Recommended Level of Care: ?[place] Prior Approval Number: ?  ? ?Date Approved/Denied: ?  PASRR Number: ?Kashvi, Prevette (Son) [phone] ? ?Discharge Plan: ?SNF ?  ? ?Current Diagnoses: ?Patient Active Problem List  ? Diagnosis Date Noted  ? Unable to ambulate 04/13/2022  ? Dyslipidemia 04/13/2022  ? Pulmonary hypertension ([place]) 04/13/2022  ? Acute on chronic diastolic CHF (congestive heart failure) ([place]) 04/13/2022  ? Peripheral neuropathy 04/13/2022  ? Acute on chronic respiratory failure with hypoxia ([place]) 04/13/2022  ? Pelvic floor weakness in female 11/16/2021  ? Cystocele with prolapse 11/16/2021  ? Chronic left hip pain 07/02/2019  ? Primary osteoarthritis of both hips 07/02/2019  ? OAB (overactive bladder) 06/10/2018  ? Elevated hemoglobin A1c 03/07/2018  ? Protein-calorie malnutrition, severe ([place]) 01/10/2018  ? Pulmonary HTN ([place]) 01/08/2018  ? Lymphedema 10/28/2017  ? Hyperlipidemia 10/08/2017  ? Chronic back pain 08/26/2017  ? Other constipation 08/19/2017  ? Essential hypertension 03/13/2016  ? COPD (chronic obstructive pulmonary disease) ([place])   ? Chronic diastolic CHF (congestive heart failure) ([place])   ? 1st degree AV block   ? Interstitial lung disease ([place]) 10/17/2015  ? Hypothyroidism 09/27/2015  ? Lumbar and sacral osteoarthritis 03/11/2015  ? Degeneration of intervertebral disc of lumbar region 07/13/2014  ? Lumbar radiculitis 07/13/2014  ? Lumbar stenosis  with neurogenic claudication 07/13/2014  ? History of neutropenia 03/31/2013  ? ION (ischemic optic neuropathy) 09/30/2012  ? OP (osteoporosis) 09/30/2012  ? MITRAL REGURGITATION 04/12/2009  ? Mitral valve disorder 04/12/2009  ? GERD 04/12/2009  ? ? ?Orientation RESPIRATION BLADDER Height & Weight   ?  ?Self, Time, Situation, Place ? O2 (2 Liters) Incontinent Weight: 82 lb 7.2 oz (37.4 kg) ?Height:  '4\' 7"'$  (139.7 cm)  ?BEHAVIORAL SYMPTOMS/MOOD NEUROLOGICAL BOWEL NUTRITION STATUS  ?    Continent Diet (Regular)  ?AMBULATORY STATUS COMMUNICATION OF NEEDS Skin   ?Extensive Assist Verbally Normal ?  ?  ?  ?    ?     ?     ? ? ?Personal Care Assistance Level of Assistance  ?Bathing, Feeding, Dressing Bathing Assistance: Maximum assistance ?Feeding assistance: Independent ?Dressing Assistance: Maximum assistance ?   ? ?Functional Limitations Info  ?Sight, Hearing, Speech Sight Info: Impaired (Wears glasses) ?Hearing Info: Adequate ?Speech Info: Adequate  ? ? ?SPECIAL CARE FACTORS FREQUENCY  ?    ?  ?  ?  ?  ?  ?  ?   ? ? ?Contractures Contractures Info: Not present  ? ? ?Additional Factors Info  ?Code Status Code Status Info: DNR ?  ?  ?  ?  ?   ? ?Current Medications (04/14/2022):  This is the current hospital active medication list ?Current Facility-Administered Medications  ?Medication Dose Route Frequency Provider Last Rate Last Admin  ? acetaminophen (TYLENOL) tablet 650 mg  650 mg Oral Q6H PRN Mansy, Jan A, MD      ? Or  ? acetaminophen (TYLENOL) suppository 650 mg  650 mg Rectal Q6H  PRN Mansy, Jan A, MD      ? enoxaparin (LOVENOX) injection 30 mg  30 mg Subcutaneous Q24H Darrick Penna, RPH   30 mg at 04/13/22 2345  ? gabapentin (NEURONTIN) capsule 100 mg  100 mg Oral TID Mansy, Jan A, MD   100 mg at 04/14/22 1507  ? levothyroxine (SYNTHROID) tablet 25 mcg  25 mcg Oral QAC breakfast Mansy, Jan A, MD   25 mcg at 04/14/22 3794  ? magnesium hydroxide (MILK OF MAGNESIA) suspension 30 mL  30 mL Oral Daily PRN Mansy, Jan  A, MD      ? metoprolol tartrate (LOPRESSOR) tablet 12.5 mg  12.5 mg Oral BID Mansy, Jan A, MD      ? mirabegron ER [facility]) tablet 25 mg  25 mg Oral Daily Mansy, Jan A, MD   25 mg at 04/14/22 1002  ? ondansetron (ZOFRAN) tablet 4 mg  4 mg Oral Q6H PRN Mansy, Jan A, MD      ? Or  ? ondansetron [facility]) injection 4 mg  4 mg Intravenous Q6H PRN Mansy, Jan A, MD      ? potassium chloride SA (KLOR-CON M) CR tablet 20 mEq  20 mEq Oral BID Fritzi Mandes, MD   20 mEq at 04/14/22 0900  ? rosuvastatin (CRESTOR) tablet 10 mg  10 mg Oral QHS Mansy, Jan A, MD   10 mg at 04/13/22 2334  ? sildenafil (REVATIO) tablet 20 mg  20 mg Oral TID Mansy, Jan A, MD   20 mg at 04/14/22 1507  ? torsemide (DEMADEX) tablet 40 mg  40 mg Oral BID Fritzi Mandes, MD   40 mg at 04/14/22 0900  ? traMADol (ULTRAM) tablet 50-100 mg  50-100 mg Oral Q6H PRN Mansy, Jan A, MD   50 mg at 04/14/22 1525  ? traZODone (DESYREL) tablet 25 mg  25 mg Oral QHS PRN Mansy, Arvella Merles, MD      ? ? ? ?Discharge Medications: ?Please see discharge summary for a list of discharge medications. ? ?Relevant Imaging Results: ? ?Relevant Lab Results: ? ? ?Additional Information ?SSN# [id number] ? ?Raina Mina, LCSWA ? ? ? ? ?

## 2022-04-14 NOTE — Progress Notes (Signed)
[place] at [facility] ? ? ?PATIENT NAME: Jennifer Skinner   ? ?MR#:  [id number] ? ?DATE OF BIRTH:  1931/01/06 ? ?SUBJECTIVE:  ? ?patient came in after she started having left knee pain. X-rays showed proximal fibular nondisplaced fracture. Patient and son in the room denies any fall. Patient out in the recliner. Complains of knee pain. A bit irritable  ? ? ?VITALS:  ?Blood pressure (!) 110/50, pulse 94, temperature 97.8 ?F (36.6 ?C), temperature source Oral, resp. rate 18, height '4\' 7"'$  (1.397 m), weight 37.4 kg, SpO2 92 %. ? ?PHYSICAL EXAMINATION:  ? ?GENERAL:  88 y.o.-year-old patient lying in the bed with no acute distress.  ?LUNGS: decreased  breath sounds bilaterally, no wheezing, rales, rhonchi. scoliosis ?CARDIOVASCULAR: S1, S2 normal.   ?ABDOMEN: Soft, nontender, nondistended. Bowel sounds present.  ?EXTREMITIES: 2++ edema both legs  ?NEUROLOGIC: nonfocal  patient is alert and awake ? ?LABORATORY PANEL:  ?CBC ?Recent Labs  ?Lab 04/14/22 ?1025  ?WBC 5.0  ?HGB 12.6  ?HCT 41.6  ?PLT 251  ? ? ?Chemistries  ?Recent Labs  ?Lab 04/13/22 ?1529 04/14/22 ?8527  ?NA 139 141  ?K 4.2 3.2*  ?CL 98 101  ?CO2 33* 34*  ?GLUCOSE 78 78  ?BUN 41* 34*  ?CREATININE 0.77 0.66  ?CALCIUM 8.4* 8.2*  ?AST 32  --   ?ALT 24  --   ?ALKPHOS 97  --   ?BILITOT 0.5  --   ? ?Cardiac Enzymes ?No results for input(s): TROPONINI in the last 168 hours. ?RADIOLOGY:  ?DG Chest 1 View ? ?Result Date: 04/13/2022 ?CLINICAL DATA:  Shortness of breath and leg swelling. EXAM: CHEST  1 VIEW COMPARISON:  Radiograph 01/07/2018 FINDINGS: Chronic cardiomegaly. Unchanged mediastinal contours with aortic atherosclerosis and tortuosity. Increased right pleural effusion and basilar opacity from 2019 exam. There is a small left pleural effusion with streaky left lung base opacities. Vascular congestion with possible central perihilar edema. No pneumothorax. Remote left rib fractures. The bones are under mineralized IMPRESSION: 1. Chronic  cardiomegaly. Increased right pleural effusion and basilar opacity from 2019 exam, likely combination of pleural fluid and atelectasis. 2. Small left pleural effusion with streaky left lung base opacities, likely atelectasis. 3. Vascular congestion with possible central perihilar edema. Electronically Signed   By: Keith Rake M.D.   On: 04/13/2022 17:16   ? ?Assessment and Plan ? ?Jennifer Skinner is a 88 y.o. Caucasian female with medical history significant for COPD, GERD, dyslipidemia mitral valve prolapse and regurgitation, osteoporosis and lumbar degenerative disc disease, who presented to the ER with acute onset of left leg swelling and pain.  She is not sure if she fell or hit her left leg.  She has not been able to ambulate secondarily.  She lives alone.  She is on home O2 at 3 L/min and her pulse oximetry has been dropping ? ?left proximal fibula nondisplaced fracture ?-- continue knee immobilizer ?-- orthopedic consultation with Dr. Sharlet Salina noted-- okay to start PT with weight-bearing as tolerated ?-- prn pain meds ?-- TOC for discharge planning to rehab ? ?chronic diastolic congestive heart failure ?emphysema/COPD/pulmonary fibrosis ?-- patient has been noncompliant to oxygen at home per son ?-- continue torsemide ? ?Hypothyroidism ?-- Synthroid ? ?pulmonary hypertension ?--cont revatio ? ?dyslipidemia continue statins ? ? ? ?Procedures: ?Family communication : son Jennifer Skinner at bedside  ?Consults :orthopedic ?CODE STATUS: DNR confirmed with son ?DVT Prophylaxis : ?Level of care: Med-Surg ?Status is: Inpatient ?Remains inpatient appropriate because: TOC for  discharge planning to rehab ?  ? ?TOTAL TIME TAKING CARE OF THIS PATIENT: 35 minutes.  ?>50% time spent on counselling and coordination of care ? ?Note: This dictation was prepared with Dragon dictation along with smaller phrase technology. Any transcriptional errors that result from this process are unintentional. ? ?Fritzi Mandes M.D  ? ? ?[facility]  ? ?CC: ?Primary care physician; Olin Hauser, DO  ?

## 2022-04-14 NOTE — Evaluation (Addendum)
Physical Therapy Evaluation ?Patient Details ?Name: Jennifer Skinner ?MRN: [id number] ?DOB: 1931/06/13 ?Today's Date: 04/14/2022 ? ?History of Present Illness ? Pt is an 88 y/o F admitted on 04/13/22 after presenting with acute onset of LLE swelling & pain & has been unable to ambulate since. Pt found to have L fibular fx. PMH: COPD, GERD, dyslipidemia, mitral valve prolapse & regurgitation, osteoporosis, lumbar DDD  ?Clinical Impression ? Pt seen for PT evaluation with pt reporting prior to admission she was living alone in a house with 3 steps to enter & ambulating without AD & son assisted with meals. On this date, pt endorses LLE pain with movement & weight bearing. Pt requires max assist for bed mobility & max assist for STS with HHA or RW. Pt is unable to weight shift to LLE to take steps to transfer to recliner so after assisting with peri hygiene at bed level assisted pt with lateral scoot to recliner with pt able to initiate & assist with scooting & PT guiding movements. Pt requires multiple pillows and various positioning adjustments in recliner to increase comfort as pt has significant kyphosis. At this time pt is unsafe to d/c home alone & would benefit from STR upon d/c to maximize independence with functional mobility & reduce fall risk prior to return home.  ?   ? ?Recommendations for follow up therapy are one component of a multi-disciplinary discharge planning process, led by the attending physician.  Recommendations may be updated based on patient status, additional functional criteria and insurance authorization. ? ?Follow Up Recommendations Skilled nursing-short term rehab (<3 hours/day) ? ?  ?Assistance Recommended at Discharge Frequent or constant Supervision/Assistance  ?Patient can return home with the following ? A lot of help with walking and/or transfers;A lot of help with bathing/dressing/bathroom;Assist for transportation;Assistance with cooking/housework;Help with stairs or ramp for  entrance;Direct supervision/assist for medications management ? ?  ?Equipment Recommendations None recommended by PT  ?Recommendations for Other Services ?   OT consult ?  ?Functional Status Assessment Patient has had a recent decline in their functional status and demonstrates the ability to make significant improvements in function in a reasonable and predictable amount of time.  ? ?  ?Precautions / Restrictions Precautions ?Precautions: Fall ?Restrictions ?Weight Bearing Restrictions: Yes ?LLE Weight Bearing: Weight bearing as tolerated  ? ?  ? ?Mobility ? Bed Mobility ?Overal bed mobility: Needs Assistance ?Bed Mobility: Rolling, Supine to Sit, Sit to Supine ?Rolling: Max assist ?  ?Supine to sit: Max assist, HOB elevated ?Sit to supine: Max assist, HOB elevated ?  ?General bed mobility comments: doesn't tolerate bed flat 2/2 significant kyphosis ?  ? ?Transfers ?Overall transfer level: Needs assistance ?Equipment used: Rolling walker (2 wheels) ?Transfers: Sit to/from Stand, Bed to chair/wheelchair/BSC ?Sit to Stand: Max assist (Pt able to transfer STS with HHA & then with RW with cuing/assistance for proper hand placement) ?  ?  ?  ?  ? Lateral/Scoot Transfers: Mod assist (pt is able to complete lateral scoot to R with mod assist with PT guiding movements but pt able to scoot across) ?  ?  ? ?Ambulation/Gait ?  ?  ?  ?  ?  ?  ?  ?  ? ?Stairs ?  ?  ?  ?  ?  ? ?Wheelchair Mobility ?  ? ?Modified Rankin (Stroke Patients Only) ?  ? ?  ? ?Balance Overall balance assessment: Needs assistance ?Sitting-balance support: Feet supported, Bilateral upper extremity supported ?Sitting balance-Leahy Scale: Fair ?  ?  ?  Standing balance support: During functional activity, Bilateral upper extremity supported ?Standing balance-Leahy Scale: Poor ?  ?  ?  ?  ?  ?  ?  ?  ?  ?  ?  ?  ?   ? ? ? ?Pertinent Vitals/Pain Pain Assessment ?Pain Assessment: Faces ?Faces Pain Scale: Hurts whole lot ?Pain Location: LLE with movement ?Pain  Descriptors / Indicators: Grimacing, Crying ?Pain Intervention(s): Repositioned, Limited activity within patient's tolerance  ? ? ?Home Living Family/patient expects to be discharged to:: Private residence ?Living Arrangements: Alone ?Available Help at Discharge: Family;Available PRN/intermittently ?Type of Home: House ?Home Access: Stairs to enter ?  ?Entrance Stairs-Number of Steps: 3 ?  ?Home Layout: One level ?Home Equipment: None ?   ?  ?Prior Function   ?  ?  ?  ?  ?  ?  ?Mobility Comments: Pt reports she's independent without AD, ambulatory. ?ADLs Comments: Son assists with providing meals. ?  ? ? ?Hand Dominance  ?   ? ?  ?Extremity/Trunk Assessment  ? Upper Extremity Assessment ?Upper Extremity Assessment: Generalized weakness ?  ? ?Lower Extremity Assessment ?Lower Extremity Assessment: Generalized weakness (Pt able to weight bear through BLE in standing; endorses pain with LLE movement & unable to weight shift to LLE to attempt taking steps) ?  ? ?Cervical / Trunk Assessment ?Cervical / Trunk Assessment: Kyphotic (question scoliosis)  ?Communication  ? Communication: HOH  ?Cognition Arousal/Alertness: Lethargic ?Behavior During Therapy: Flat affect (Pt appears awake but keeps eyes closed throughout majority of session.) ?Overall Cognitive Status: No family/caregiver present to determine baseline cognitive functioning ?  ?  ?  ?  ?  ?  ?  ?  ?  ?  ?  ?  ?  ?  ?  ?  ?General Comments: Follows simple commands with extra time with multimodal cuing. ?  ?  ? ?  ?General Comments General comments (skin integrity, edema, etc.): Pt found to be soiled with urine & BM & PT/nursing staff provides total assist for peri hygiene. Pt on 3L/min via nasal cannula throughout session. ? ?  ?Exercises    ? ?Assessment/Plan  ?  ?PT Assessment Patient needs continued PT services  ?PT Problem List Decreased strength;Decreased mobility;Decreased safety awareness;Decreased range of motion;Decreased activity tolerance;Decreased  cognition;Cardiopulmonary status limiting activity;Decreased knowledge of precautions;Pain;Decreased knowledge of use of DME;Decreased balance ? ?   ?  ?PT Treatment Interventions DME instruction;Therapeutic activities;Modalities;Gait training;Therapeutic exercise;Patient/family education;Stair training;Balance training;Functional mobility training;Neuromuscular re-education;Manual techniques   ? ?PT Goals (Current goals can be found in the Care Plan section)  ?Acute Rehab PT Goals ?Patient Stated Goal: decreased pain ?PT Goal Formulation: With patient ?Time For Goal Achievement: 04/28/22 ?Potential to Achieve Goals: Fair ? ?  ?Frequency Min 2X/week ?  ? ? ?Co-evaluation   ?  ?  ?  ?  ? ? ?  ?AM-PAC PT "6 Clicks" Mobility  ?Outcome Measure Help needed turning from your back to your side while in a flat bed without using bedrails?: Total ?Help needed moving from lying on your back to sitting on the side of a flat bed without using bedrails?: Total ?Help needed moving to and from a bed to a chair (including a wheelchair)?: A Lot ?Help needed standing up from a chair using your arms (e.g., wheelchair or bedside chair)?: A Lot ?Help needed to walk in hospital room?: Total ?Help needed climbing 3-5 steps with a railing? : Total ?6 Click Score: 8 ? ?  ?End of Session Equipment Utilized  During Treatment: Oxygen ?Activity Tolerance: Patient limited by pain;Patient tolerated treatment well ?Patient left: with chair alarm set;in chair;with nursing/sitter in room;with call bell/phone within reach ?Nurse Communication: Mobility status ?PT Visit Diagnosis: Unsteadiness on feet (R26.81);Muscle weakness (generalized) (M62.81);Difficulty in walking, not elsewhere classified (R26.2);Pain ?Pain - Right/Left: Left ?Pain - part of body: Leg ?  ? ?Time: 5056-9794 ?PT Time Calculation (min) (ACUTE ONLY): 24 min ? ? ?Charges:   PT Evaluation ?$PT Eval Moderate Complexity: 1 Mod ?PT Treatments ?$Therapeutic Activity: 8-22 mins ?  ?    ? ? ?Lavone Nian, PT, DPT ?04/14/22, 10:16 AM ? ? ?Waunita Schooner ?04/14/2022, 10:11 AM ? ?

## 2022-04-14 NOTE — Evaluation (Signed)
Occupational Therapy Evaluation ?Patient Details ?Name: Jennifer Skinner ?MRN: [id number] ?DOB: [birth year] ?Today's Date: 04/14/2022 ? ? ?History of Present Illness Pt is an 88 y/o F admitted on 04/13/22 after presenting with acute onset of LLE swelling & pain & has been unable to ambulate since. Pt found to have L fibular fx. PMH: COPD, GERD, dyslipidemia, mitral valve prolapse & regurgitation, osteoporosis, lumbar DDD  ? ?Clinical Impression ?  ?Chart reviewed to date, pt greeted in room with son present. Pt is alert, oriented x4. PTA pt reports she is MOD I-I in mobility and ADL task completion; son assists with IADLs such as driving, shopping, cooking. Intermittent assist has been required LB dressing per pt son report. Pt reporting significant pain with positioning due to kyphotic posturing. Pt performs STS with MOD A with RW. Unable to take steps due to LLE pain. MAX A required for LB dressing. Significant time required for optimal positioning in recliner- pt reports she utilizes wooden chair with handles at home. Pt presents with deficits in strength, endurance, activity tolerance all affecting optimal ADL task completion. Pt is left as received, NAD all needs met. Recommend STR upon discharge, OT will continue to follow acutely.  ?   ? ?Recommendations for follow up therapy are one component of a multi-disciplinary discharge planning process, led by the attending physician.  Recommendations may be updated based on patient status, additional functional criteria and insurance authorization.  ? ?Follow Up Recommendations ? Skilled nursing-short term rehab (<3 hours/day)  ?  ?Assistance Recommended at Discharge Frequent or constant Supervision/Assistance  ?Patient can return home with the following A lot of help with walking and/or transfers;A lot of help with bathing/dressing/bathroom;Assistance with cooking/housework;Direct supervision/assist for financial management;Help with stairs or ramp for entrance;Direct  supervision/assist for medications management ? ?  ?Functional Status Assessment ? Patient has had a recent decline in their functional status and demonstrates the ability to make significant improvements in function in a reasonable and predictable amount of time.  ?Equipment Recommendations ? BSC/3in1;Tub/shower seat  ?  ?Recommendations for Other Services   ? ? ?  ?Precautions / Restrictions Precautions ?Precautions: Fall ?Restrictions ?Weight Bearing Restrictions: Yes ?LLE Weight Bearing: Weight bearing as tolerated  ? ?  ? ?Mobility Bed Mobility ?  ?  ?  ?  ?  ?  ?  ?General bed mobility comments: NT pt in recliner pre-post session ?  ? ?Transfers ?Overall transfer level: Needs assistance ?Equipment used: Rolling walker (2 wheels) ?Transfers: Sit to/from Stand ?Sit to Stand: Mod assist (with RW) ?  ?  ?  ?  ?  ?General transfer comment: Pt able to compelte STS from bedside chair with MOD A with RW, unable to take steps ?  ? ?  ?Balance Overall balance assessment: Needs assistance ?Sitting-balance support: Feet supported, Bilateral upper extremity supported ?Sitting balance-Leahy Scale: Fair ?  ?  ?Standing balance support: During functional activity, Bilateral upper extremity supported ?Standing balance-Leahy Scale: Poor ?  ?  ?  ?  ?  ?  ?  ?  ?  ?  ?  ?  ?   ? ?ADL either performed or assessed with clinical judgement  ? ?ADL Overall ADL's : Needs assistance/impaired ?  ?  ?Grooming: Wash/dry hands;Sitting;Set up ?  ?  ?  ?  ?  ?  ?  ?Lower Body Dressing: Maximal assistance ?Lower Body Dressing Details (indicate cue type and reason): socks ?  ?  ?Toileting- Clothing Manipulation and Hygiene: Maximal assistance ?  ?  ?  ?  ?   ? ? ? ?  Vision Patient Visual Report: No change from baseline ?   ?   ?Perception   ?  ?Praxis   ?  ? ?Pertinent Vitals/Pain Pain Assessment ?Pain Assessment: Faces ?Faces Pain Scale: Hurts little more ?Pain Location: back and LLE ?Pain Descriptors / Indicators: Grimacing, Crying ?Pain  Intervention(s): Limited activity within patient's tolerance, Monitored during session, Repositioned  ? ? ? ?Hand Dominance   ?  ?Extremity/Trunk Assessment Upper Extremity Assessment ?Upper Extremity Assessment: Generalized weakness ?  ?Lower Extremity Assessment ?Lower Extremity Assessment: Generalized weakness;LLE deficits/detail ?  ?Cervical / Trunk Assessment ?Cervical / Trunk Assessment: Kyphotic (severely kyphotic) ?  ?Communication Communication ?Communication: HOH ?  ?Cognition Arousal/Alertness: Awake/alert ?Behavior During Therapy: Restless ?Overall Cognitive Status: History of cognitive impairments - at baseline ?Area of Impairment: Attention, Memory, Following commands, Safety/judgement, Awareness, Problem solving ?  ?  ?  ?  ?  ?  ?  ?  ?  ?Current Attention Level: Sustained ?Memory: Decreased short-term memory ?Following Commands: Follows one step commands with increased time ?Safety/Judgement: Decreased awareness of safety, Decreased awareness of deficits ?Awareness: Emergent ?Problem Solving: Requires verbal cues, Requires tactile cues ?General Comments: Pt is limited by pain ?  ?  ?General Comments  Pt found to be soiled with urine & BM & PT/nursing staff provides total assist for peri hygiene. Pt on 3L/min via nasal cannula throughout session. ? ?  ?Exercises   ?  ?Shoulder Instructions    ? ? ?Home Living Family/patient expects to be discharged to:: Private residence ?Living Arrangements: Alone ?Available Help at Discharge: Family;Available PRN/intermittently (son) ?Type of Home: House ?Home Access: Stairs to enter ?Entrance Stairs-Number of Steps: 3 ?  ?Home Layout: One level ?  ?  ?Bathroom Shower/Tub: Tub/shower unit ?  ?  ?  ?  ?Home Equipment: None ?  ?  ?  ? ?  ?Prior Functioning/Environment   ?  ?  ?  ?  ?  ?  ?Mobility Comments: pt reports she ind without AD household distances ?ADLs Comments: sits in bath for bathing with MOD I per pt report, son assisting with LB dressing recently; pt  reports she is MOD I in UB dressing/grooming tasks; son grocery shops and prepares meals ?  ? ?  ?  ?OT Problem List: Decreased strength;Decreased activity tolerance;Decreased knowledge of use of DME or AE ?  ?   ?OT Treatment/Interventions: Self-care/ADL training;Therapeutic exercise;Patient/family education;Energy conservation;DME and/or AE instruction;Therapeutic activities  ?  ?OT Goals(Current goals can be found in the care plan section) Acute Rehab OT Goals ?Patient Stated Goal: get more comfortable ?OT Goal Formulation: With patient/family ?Time For Goal Achievement: 04/28/22 ?Potential to Achieve Goals: Fair ?ADL Goals ?Pt Will Perform Grooming: with supervision;sitting ?Pt Will Perform Lower Body Dressing: sit to/from stand;with mod assist ?Pt Will Transfer to Toilet: with supervision;bedside commode;stand pivot transfer ?Pt Will Perform Toileting - Clothing Manipulation and hygiene: with supervision;sit to/from stand  ?OT Frequency: Min 2X/week ?  ? ?Co-evaluation   ?  ?  ?  ?  ? ?  ?AM-PAC OT "6 Clicks" Daily Activity     ?Outcome Measure Help from another person eating meals?: None ?Help from another person taking care of personal grooming?: A Little ?Help from another person toileting, which includes using toliet, bedpan, or urinal?: A Lot ?Help from another person bathing (including washing, rinsing, drying)?: A Lot ?Help from another person to put on and taking off regular upper body clothing?: A Lot ?Help from another person to put on and taking  off regular lower body clothing?: A Lot ?6 Click Score: 15 ?  ?End of Session Equipment Utilized During Treatment: Rolling walker (2 wheels) ?Nurse Communication: Mobility status ? ?Activity Tolerance: Patient limited by pain ?Patient left: in chair;with call bell/phone within reach;with chair alarm set;with family/visitor present ? ?OT Visit Diagnosis: Unsteadiness on feet (R26.81);Muscle weakness (generalized) (M62.81)  ?              ?Time: 0871-9941 ?OT  Time Calculation (min): 33 min ?Charges:  OT General Charges ?$OT Visit: 1 Visit ?OT Evaluation ?$OT Eval Moderate Complexity: 1 Mod ?OT Treatments ?$Self Care/Home Management : 8-22 mins ? ?Shanon Payor, OTD OTR

## 2022-04-14 NOTE — Plan of Care (Signed)

## 2022-04-14 NOTE — Consult Note (Signed)
?ORTHOPAEDIC CONSULTATION ? ?REQUESTING PHYSICIAN: Fritzi Mandes, MD ? ?Chief Complaint: Left knee pain, inability to ambulate ? ?HPI: ?Jennifer Skinner is a 88 y.o. female who complains of left knee pain and inability to ambulate.  Patient was seen in the ED and x-rays show a nondisplaced left proximal/fibular neck fracture.  Patient was admitted to the hospitalist service.  Orthopedics was consulted regarding recommendations. ? ?PMH notable for COPD, GERD, dyslipidemia mitral valve prolapse and regurgitation, osteoporosis and lumbar degenerative disc disease ? ?Past Medical History:  ?Diagnosis Date  ? 1st degree AV block   ? Arthritis   ? Chronic diastolic CHF (congestive heart failure) ([place])   ? a. echo 2014: EF 55-60%, nl wall motion, GR1DD, mild MR, moder mitral prolapse, PASP 40 mm Hg; b. echo 09/2015: EF 60-65%, nl wall motion, GR1DD, mod MR, mild to mod TR, PASP 56 mm Hg  ? COPD (chronic obstructive pulmonary disease) ([place])   ? Degenerative disc disease, lumbar   ? Gastro-esophageal reflux   ? Hyperlipidemia   ? Mitral prolapse   ? Mitral regurgitation   ? Osteoporosis   ? ?Past Surgical History:  ?Procedure Laterality Date  ? ABDOMINAL HYSTERECTOMY    ? APPENDECTOMY    ? DILATION AND CURETTAGE OF UTERUS    ? HYSTEROTOMY    ? right breast biopsy    ? right eye     ? surgery  ? TONSILLECTOMY    ? ?Social History  ? ?Socioeconomic History  ? Marital status: Married  ?  Spouse name: Not on file  ? Number of children: Not on file  ? Years of education: Not on file  ? Highest education level: Not on file  ?Occupational History  ? Not on file  ?Tobacco Use  ? Smoking status: Never  ? Smokeless tobacco: Never  ?Vaping Use  ? Vaping Use: Never used  ?Substance and Sexual Activity  ? Alcohol use: No  ?  Alcohol/week: 0.0 standard drinks  ? Drug use: No  ?  Comment: prescribed tramadol  ? Sexual activity: Not Currently  ?Other Topics Concern  ? Not on file  ?Social History Narrative  ? Not on file  ? ?Social  Determinants of Health  ? ?Financial Resource Strain: Low Risk   ? Difficulty of Paying Living Expenses: Not hard at all  ?Food Insecurity: No Food Insecurity  ? Worried About Charity fundraiser in the Last Year: Never true  ? Ran Out of Food in the Last Year: Never true  ?Transportation Needs: No Transportation Needs  ? Lack of Transportation (Medical): No  ? Lack of Transportation (Non-Medical): No  ?Physical Activity: Inactive  ? Days of Exercise per Week: 0 days  ? Minutes of Exercise per Session: 0 min  ?Stress: No Stress Concern Present  ? Feeling of Stress : Not at all  ?Social Connections: Not on file  ? ?Family History  ?Problem Relation Age of Onset  ? CVA Mother 33  ? Heart attack Father 94  ? Throat cancer Sister   ? CAD Brother   ? ?Allergies  ?Allergen Reactions  ? Penicillins   ?  Rash   ? ?Prior to Admission medications   ?Medication Sig Start Date End Date Taking? Authorizing Provider  ?acetaminophen (TYLENOL) 500 MG tablet Take 500 mg by mouth every 6 (six) hours as needed.   Yes [provider]  ?aspirin EC 81 MG tablet Take 81 mg by mouth daily. 08/18/18  Yes Minna Merritts,  MD  ?gabapentin (NEURONTIN) 100 MG capsule Take 1 capsule (100 mg total) by mouth 3 (three) times daily. 07/25/21  Yes Karamalegos, Devonne Doughty, DO  ?levothyroxine (SYNTHROID) 25 MCG tablet Take 1 tablet (25 mcg total) by mouth daily before breakfast. 07/25/21  Yes Karamalegos, Devonne Doughty, DO  ?metoprolol tartrate (LOPRESSOR) 25 MG tablet Take 0.5 tablets (12.5 mg total) by mouth 2 (two)times daily. 02/15/22  Yes Karamalegos, Devonne Doughty, DO  ?mirabegron ER (MYRBETRIQ) 25 MG TB24 tablet Take 1 tablet (25 mg total) by mouth daily. 07/25/21  Yes Karamalegos, Devonne Doughty, DO  ?polyethylene glycol powder (GLYCOLAX/MIRALAX) powder Take 17-34 g by mouth daily as needed. 08/19/17  Yes Karamalegos, Devonne Doughty, DO  ?rosuvastatin (CRESTOR) 10 MG tablet Take 1 tablet (10 mg total) by mouth at bedtime. 07/25/21  Yes Karamalegos,  Devonne Doughty, DO  ?sildenafil (REVATIO) 20 MG tablet Take 1 tablet (20 mg total) by mouth 3 (three) times daily. For pulmonary hypertension 02/15/22  Yes Karamalegos, Devonne Doughty, DO  ?torsemide (DEMADEX) 20 MG tablet Take 2 tablets (40 mg total) by mouth 2 (two) times daily. May take extra 20 mg as needed for shortness of breath 12/13/21  Yes Gollan, Kathlene November, MD  ?traMADol (ULTRAM) 50 MG tablet Take 1-2 tablets (50-100 mg total) by mouth every 6 (six) hours as needed for moderate pain. Take up to max 6 pills per 24 hours. 04/06/22  Yes Karamalegos, Devonne Doughty, DO  ?potassium chloride (KLOR-CON M) 10 MEQ tablet Take 1 tablet (10 mEq total) by mouth 2 (two) times daily. ?Patient not taking: Reported on 04/13/2022 04/13/22   Jearld Fenton, NP  ? ?DG Chest 1 View ? ?Result Date: 04/13/2022 ?CLINICAL DATA:  Shortness of breath and leg swelling. EXAM: CHEST  1 VIEW COMPARISON:  Radiograph 01/07/2018 FINDINGS: Chronic cardiomegaly. Unchanged mediastinal contours with aortic atherosclerosis and tortuosity. Increased right pleural effusion and basilar opacity from 2019 exam. There is a small left pleural effusion with streaky left lung base opacities. Vascular congestion with possible central perihilar edema. No pneumothorax. Remote left rib fractures. The bones are under mineralized IMPRESSION: 1. Chronic cardiomegaly. Increased right pleural effusion and basilar opacity from 2019 exam, likely combination of pleural fluid and atelectasis. 2. Small left pleural effusion with streaky left lung base opacities, likely atelectasis. 3. Vascular congestion with possible central perihilar edema. Electronically Signed   By: Keith Rake M.D.   On: 04/13/2022 17:16   ? ?Positive ROS: All other systems have been reviewed and were otherwise negative with the exception of those mentioned in the HPI and as above. ? ?Physical Exam: ?General: Alert, no acute distress ?Cardiovascular: No pedal edema ?Respiratory: No cyanosis, no use of  accessory musculature ?GI: No organomegaly, abdomen is soft and non-tender ?Skin: No lesions in the area of chief complaint ?Neurologic: Sensation intact distally ?Psychiatric: Patient is competent for consent with normal mood and affect ?Lymphatic: No axillary or cervical lymphadenopathy ? ?MUSCULOSKELETAL:  ? ?Left knee: No effusion, very mild tenderness to palpation about the fibular head.  Tolerates knee range of motion 0 to 90 degrees.  Able to actively dorsiflex and plantarflex the ankle.  Left lower extremity is motor and sensory intact ? ?Assessment: ?88 year old female with nondisplaced fracture of left fibular neck ? ?Plan: ?-Pain management per hospitalist team ?-PT/OT: Patient can be weightbearing as tolerated with no restrictions on knee range of motion ?-Follow-up in clinic in 3 to 4 weeks with repeat x-rays ? ? ? ?Renee Harder, MD ? ? ? ?  04/14/2022 ?11:02 AM ? ? ? ?

## 2022-04-15 DIAGNOSIS — R262 Difficulty in walking, not elsewhere classified: Secondary | ICD-10-CM | POA: Diagnosis not present

## 2022-04-15 MED ORDER — ALUM & MAG HYDROXIDE-SIMETH 200-200-20 MG/5ML PO SUSP
30.0000 mL | Freq: Four times a day (QID) | ORAL | Status: DC | PRN
  Administered 2022-04-15: 30 mL via ORAL
  Filled 2022-04-15: qty 30

## 2022-04-15 NOTE — TOC Initial Note (Signed)
Transition of Care (TOC) - Initial/Assessment Note  ? ? ?Patient Details  ?Name: Jennifer Skinner ?MRN: [id number] ?Date of Birth: 1931-01-20 ? ?Transition of Care (TOC) CM/SW Contact:    ?Jennifer Skinner, LCSWA ?Phone Number: ?04/15/2022, 10:08 AM ? ?Clinical Narrative: Patient and patients son agree with SNF placement. Patients son had concerns about his mother getting to SNF by EMS. Patient stated his mother may not want to go by ambulance. Patients son stated he might take his mother to the SNF placement when she is ready for discharge. CSW started SNF bed search.             ? ? ?Expected Discharge Plan: [place] ?Barriers to Discharge: Continued Medical Work up ? ? ?Patient Goals and CMS Choice ?Patient states their goals for this hospitalization and ongoing recovery are:: Return home after rehab ?  ?  ? ?Expected Discharge Plan and Services ?Expected Discharge Plan: [place] ?  ?  ?  ?Living arrangements for the past 2 months: [place] ?                ?  ?  ?  ?  ?  ?  ?  ?  ?  ?  ? ?Prior Living Arrangements/Services ?Living arrangements for the past 2 months: [place] ?Lives with:: Adult Children ?Patient language and need for interpreter reviewed:: Yes ?Do you feel safe going back to the place where you live?: Yes      ?Need for Family Participation in Patient Care: Yes (Comment) ?Care giver support system in place?: Yes (comment) ?  ?Criminal Activity/Legal Involvement Pertinent to Current Situation/Hospitalization: No - Comment as needed ? ?Activities of Daily Living ?Home Assistive Devices/Equipment: None ?ADL Screening (condition at time of admission) ?Patient's cognitive ability adequate to safely complete daily activities?: Yes ?Is the patient deaf or have difficulty hearing?: Yes ?Does the patient have difficulty seeing, even when wearing glasses/contacts?: No ?Does the patient have difficulty concentrating, remembering, or making decisions?:  No ?Patient able to express need for assistance with ADLs?: Yes ?Does the patient have difficulty dressing or bathing?: No ?Independently performs ADLs?: Yes (appropriate for developmental age) ?Does the patient have difficulty walking or climbing stairs?: Yes ?Weakness of Legs: None ?Weakness of Arms/Hands: None ? ?Permission Sought/Granted ?Permission sought to share information with : Family Supports ?  ? Share Information with NAME: Jennifer, Skinner)   [phone] ?   ?   ?   ? ?Emotional Assessment ?Appearance:: Appears stated age ?Attitude/Demeanor/Rapport: Engaged ?Affect (typically observed): Accepting ?Orientation: : Oriented to Self, Oriented to Place, Oriented to  Time, Oriented to Situation ?Alcohol / Substance Use: Not Applicable ?Psych Involvement: No (comment) ? ?Admission diagnosis:  Unable to ambulate [R26.2] ?Patient Active Problem List  ? Diagnosis Date Noted  ? Unable to ambulate 04/13/2022  ? Dyslipidemia 04/13/2022  ? Pulmonary hypertension ([place]) 04/13/2022  ? Acute on chronic diastolic CHF (congestive heart failure) ([place]) 04/13/2022  ? Peripheral neuropathy 04/13/2022  ? Acute on chronic respiratory failure with hypoxia ([place]) 04/13/2022  ? Pelvic floor weakness in female 11/16/2021  ? Cystocele with prolapse 11/16/2021  ? Chronic left hip pain 07/02/2019  ? Primary osteoarthritis of both hips 07/02/2019  ? OAB (overactive bladder) 06/10/2018  ? Elevated hemoglobin A1c 03/07/2018  ? Protein-calorie malnutrition, severe ([place]) 01/10/2018  ? Pulmonary HTN ([place]) 01/08/2018  ? Lymphedema 10/28/2017  ? Hyperlipidemia 10/08/2017  ? Chronic back pain 08/26/2017  ? Other constipation 08/19/2017  ?  Essential hypertension 03/13/2016  ? COPD (chronic obstructive pulmonary disease) ([place])   ? Chronic diastolic CHF (congestive heart failure) ([place])   ? 1st degree AV block   ? Interstitial lung disease ([place]) 10/17/2015  ? Hypothyroidism 09/27/2015  ? Lumbar and sacral osteoarthritis 03/11/2015  ? Degeneration of  intervertebral disc of lumbar region 07/13/2014  ? Lumbar radiculitis 07/13/2014  ? Lumbar stenosis with neurogenic claudication 07/13/2014  ? History of neutropenia 03/31/2013  ? ION (ischemic optic neuropathy) 09/30/2012  ? OP (osteoporosis) 09/30/2012  ? MITRAL REGURGITATION 04/12/2009  ? Mitral valve disorder 04/12/2009  ? GERD 04/12/2009  ? ?PCP:  Olin Hauser, DO ?Pharmacy:   ?[facility] [place] - [address] ?[address] ?[place] [address] ?Phone: [phone] Fax: [phone] ? ? ? ? ?Social Determinants of Health (SDOH) Interventions ?  ? ?Readmission Risk Interventions ?   ? View : No data to display.  ?  ?  ?  ? ? ? ?

## 2022-04-15 NOTE — Progress Notes (Signed)
[place] at [facility] ? ? ?PATIENT NAME: Jennifer Skinner   ? ?MR#:  [id number] ? ?DATE OF BIRTH:  [birth year] ? ?SUBJECTIVE:  ?patient came in after she started having left knee pain. ? X-rays showed proximal fibular nondisplaced fracture. ?Pt denies any fall ?No new issues today ? ? ?VITALS:  ?Blood pressure (!) 146/111, pulse 80, temperature 97.6 ?F (36.4 ?C), resp. rate 20, height '4\' 7"'$  (1.397 m), weight 37 kg, SpO2 91 %. ? ?PHYSICAL EXAMINATION:  ? ?GENERAL:  88 y.o.-year-old patient lying in the bed with no acute distress.  ?LUNGS: decreased  breath sounds bilaterally, no wheezing, rales, rhonchi. scoliosis ?CARDIOVASCULAR: S1, S2 normal.   ?ABDOMEN: Soft, nontender, nondistended.   ?EXTREMITIES: + edema both legs  ?NEUROLOGIC: nonfocal  patient is alert and awake ? ?LABORATORY PANEL:  ?CBC ?Recent Labs  ?Lab 04/14/22 ?3716  ?WBC 5.0  ?HGB 12.6  ?HCT 41.6  ?PLT 251  ? ? ? ?Chemistries  ?Recent Labs  ?Lab 04/13/22 ?1529 04/14/22 ?9678  ?NA 139 141  ?K 4.2 3.2*  ?CL 98 101  ?CO2 33* 34*  ?GLUCOSE 78 78  ?BUN 41* 34*  ?CREATININE 0.77 0.66  ?CALCIUM 8.4* 8.2*  ?AST 32  --   ?ALT 24  --   ?ALKPHOS 97  --   ?BILITOT 0.5  --   ? ? ?Cardiac Enzymes ?No results for input(s): TROPONINI in the last 168 hours. ?RADIOLOGY:  ?DG Chest 1 View ? ?Result Date: 04/13/2022 ?CLINICAL DATA:  Shortness of breath and leg swelling. EXAM: CHEST  1 VIEW COMPARISON:  Radiograph 01/07/2018 FINDINGS: Chronic cardiomegaly. Unchanged mediastinal contours with aortic atherosclerosis and tortuosity. Increased right pleural effusion and basilar opacity from 2019 exam. There is a small left pleural effusion with streaky left lung base opacities. Vascular congestion with possible central perihilar edema. No pneumothorax. Remote left rib fractures. The bones are under mineralized IMPRESSION: 1. Chronic cardiomegaly. Increased right pleural effusion and basilar opacity from 2019 exam, likely combination of pleural fluid  and atelectasis. 2. Small left pleural effusion with streaky left lung base opacities, likely atelectasis. 3. Vascular congestion with possible central perihilar edema. Electronically Signed   By: Jennifer Skinner M.D.   On: 04/13/2022 17:16   ? ?Assessment and Plan ? ?Jennifer Skinner is a 88 y.o. Caucasian female with medical history significant for COPD, GERD, dyslipidemia mitral valve prolapse and regurgitation, osteoporosis and lumbar degenerative disc disease, who presented to the ER with acute onset of left leg swelling and pain.  She is not sure if she fell or hit her left leg.  She has not been able to ambulate secondarily.  She lives alone.  She is on home O2 at 3 L/min and her pulse oximetry has been dropping ? ?left proximal fibula nondisplaced fracture ?-- continue knee immobilizer ?-- orthopedic consultation with Dr. Sharlet Salina noted-- okay to start PT with weight-bearing as tolerated ?-- prn pain meds ?-- TOC for discharge planning to rehab ? ?chronic diastolic congestive heart failure ?emphysema/COPD/pulmonary fibrosis ?-- patient has been noncompliant to oxygen at home per son ?-- continue torsemide ? ?Hypothyroidism ?-- Synthroid ? ?pulmonary hypertension ?--cont revatio ? ?dyslipidemia continue statins ? ? ? ?Procedures: ?Family communication : son Jennifer Skinner at bedside  ?Consults :orthopedic ?CODE STATUS: DNR confirmed with son ?DVT Prophylaxis : ?Level of care: Med-Surg ?Status is: Inpatient ?Remains inpatient appropriate because: TOC for discharge planning to rehab. ?  ? ?TOTAL TIME TAKING CARE OF THIS PATIENT: 25 minutes.  ?>  50% time spent on counselling and coordination of care ? ?Note: This dictation was prepared with Dragon dictation along with smaller phrase technology. Any transcriptional errors that result from this process are unintentional. ? ?Jennifer Skinner M.D  ? ? ?[facility]  ? ?CC: ?Primary care physician; Jennifer Hauser, DO  ?

## 2022-04-15 NOTE — Plan of Care (Signed)
  Problem: Education: Goal: Ability to demonstrate management of disease process will improve Outcome: Progressing Goal: Ability to verbalize understanding of medication therapies will improve Outcome: Progressing   Problem: Activity: Goal: Capacity to carry out activities will improve Outcome: Progressing   Problem: Cardiac: Goal: Ability to achieve and maintain adequate cardiopulmonary perfusion will improve Outcome: Progressing   

## 2022-04-16 ENCOUNTER — Emergency Department: Payer: PPO

## 2022-04-16 ENCOUNTER — Inpatient Hospital Stay
Admission: EM | Admit: 2022-04-16 | Discharge: 2022-05-10 | DRG: 291 | Disposition: E | Payer: PPO | Source: Skilled Nursing Facility | Attending: Internal Medicine | Admitting: Internal Medicine

## 2022-04-16 ENCOUNTER — Encounter: Payer: Self-pay | Admitting: Emergency Medicine

## 2022-04-16 ENCOUNTER — Other Ambulatory Visit: Payer: Self-pay

## 2022-04-16 DIAGNOSIS — K6389 Other specified diseases of intestine: Secondary | ICD-10-CM | POA: Diagnosis not present

## 2022-04-16 DIAGNOSIS — E43 Unspecified severe protein-calorie malnutrition: Secondary | ICD-10-CM | POA: Diagnosis present

## 2022-04-16 DIAGNOSIS — N2889 Other specified disorders of kidney and ureter: Secondary | ICD-10-CM | POA: Diagnosis not present

## 2022-04-16 DIAGNOSIS — I959 Hypotension, unspecified: Secondary | ICD-10-CM | POA: Diagnosis present

## 2022-04-16 DIAGNOSIS — Z681 Body mass index (BMI) 19 or less, adult: Secondary | ICD-10-CM

## 2022-04-16 DIAGNOSIS — K769 Liver disease, unspecified: Secondary | ICD-10-CM | POA: Diagnosis not present

## 2022-04-16 DIAGNOSIS — Z880 Allergy status to penicillin: Secondary | ICD-10-CM

## 2022-04-16 DIAGNOSIS — I509 Heart failure, unspecified: Secondary | ICD-10-CM

## 2022-04-16 DIAGNOSIS — Z79899 Other long term (current) drug therapy: Secondary | ICD-10-CM

## 2022-04-16 DIAGNOSIS — Z20822 Contact with and (suspected) exposure to covid-19: Secondary | ICD-10-CM | POA: Diagnosis present

## 2022-04-16 DIAGNOSIS — I5033 Acute on chronic diastolic (congestive) heart failure: Secondary | ICD-10-CM | POA: Diagnosis present

## 2022-04-16 DIAGNOSIS — R5381 Other malaise: Secondary | ICD-10-CM | POA: Diagnosis not present

## 2022-04-16 DIAGNOSIS — Z8249 Family history of ischemic heart disease and other diseases of the circulatory system: Secondary | ICD-10-CM

## 2022-04-16 DIAGNOSIS — I340 Nonrheumatic mitral (valve) insufficiency: Secondary | ICD-10-CM | POA: Diagnosis present

## 2022-04-16 DIAGNOSIS — R0689 Other abnormalities of breathing: Secondary | ICD-10-CM | POA: Diagnosis not present

## 2022-04-16 DIAGNOSIS — Z9981 Dependence on supplemental oxygen: Secondary | ICD-10-CM

## 2022-04-16 DIAGNOSIS — I5023 Acute on chronic systolic (congestive) heart failure: Secondary | ICD-10-CM

## 2022-04-16 DIAGNOSIS — R1310 Dysphagia, unspecified: Secondary | ICD-10-CM | POA: Diagnosis present

## 2022-04-16 DIAGNOSIS — K3189 Other diseases of stomach and duodenum: Secondary | ICD-10-CM | POA: Diagnosis not present

## 2022-04-16 DIAGNOSIS — G629 Polyneuropathy, unspecified: Secondary | ICD-10-CM | POA: Diagnosis present

## 2022-04-16 DIAGNOSIS — E876 Hypokalemia: Secondary | ICD-10-CM | POA: Diagnosis not present

## 2022-04-16 DIAGNOSIS — E039 Hypothyroidism, unspecified: Secondary | ICD-10-CM | POA: Diagnosis present

## 2022-04-16 DIAGNOSIS — I272 Pulmonary hypertension, unspecified: Secondary | ICD-10-CM | POA: Diagnosis present

## 2022-04-16 DIAGNOSIS — R64 Cachexia: Secondary | ICD-10-CM | POA: Diagnosis present

## 2022-04-16 DIAGNOSIS — Z79890 Hormone replacement therapy (postmenopausal): Secondary | ICD-10-CM

## 2022-04-16 DIAGNOSIS — M810 Age-related osteoporosis without current pathological fracture: Secondary | ICD-10-CM | POA: Diagnosis present

## 2022-04-16 DIAGNOSIS — D539 Nutritional anemia, unspecified: Secondary | ICD-10-CM | POA: Diagnosis present

## 2022-04-16 DIAGNOSIS — R0602 Shortness of breath: Secondary | ICD-10-CM | POA: Diagnosis present

## 2022-04-16 DIAGNOSIS — K219 Gastro-esophageal reflux disease without esophagitis: Secondary | ICD-10-CM | POA: Diagnosis present

## 2022-04-16 DIAGNOSIS — Z515 Encounter for palliative care: Secondary | ICD-10-CM

## 2022-04-16 DIAGNOSIS — Z66 Do not resuscitate: Secondary | ICD-10-CM | POA: Diagnosis present

## 2022-04-16 DIAGNOSIS — J9621 Acute and chronic respiratory failure with hypoxia: Secondary | ICD-10-CM | POA: Diagnosis present

## 2022-04-16 DIAGNOSIS — T502X5A Adverse effect of carbonic-anhydrase inhibitors, benzothiadiazides and other diuretics, initial encounter: Secondary | ICD-10-CM | POA: Diagnosis present

## 2022-04-16 DIAGNOSIS — J690 Pneumonitis due to inhalation of food and vomit: Secondary | ICD-10-CM

## 2022-04-16 DIAGNOSIS — J449 Chronic obstructive pulmonary disease, unspecified: Secondary | ICD-10-CM | POA: Diagnosis present

## 2022-04-16 DIAGNOSIS — N3281 Overactive bladder: Secondary | ICD-10-CM | POA: Diagnosis present

## 2022-04-16 DIAGNOSIS — R262 Difficulty in walking, not elsewhere classified: Secondary | ICD-10-CM | POA: Diagnosis not present

## 2022-04-16 DIAGNOSIS — E875 Hyperkalemia: Secondary | ICD-10-CM | POA: Diagnosis not present

## 2022-04-16 DIAGNOSIS — Z7189 Other specified counseling: Secondary | ICD-10-CM | POA: Diagnosis not present

## 2022-04-16 DIAGNOSIS — J918 Pleural effusion in other conditions classified elsewhere: Secondary | ICD-10-CM | POA: Diagnosis present

## 2022-04-16 DIAGNOSIS — L8990 Pressure ulcer of unspecified site, unspecified stage: Secondary | ICD-10-CM | POA: Insufficient documentation

## 2022-04-16 DIAGNOSIS — Z90710 Acquired absence of both cervix and uterus: Secondary | ICD-10-CM

## 2022-04-16 DIAGNOSIS — I110 Hypertensive heart disease with heart failure: Principal | ICD-10-CM | POA: Diagnosis present

## 2022-04-16 DIAGNOSIS — I2721 Secondary pulmonary arterial hypertension: Secondary | ICD-10-CM | POA: Diagnosis present

## 2022-04-16 DIAGNOSIS — E785 Hyperlipidemia, unspecified: Secondary | ICD-10-CM | POA: Diagnosis present

## 2022-04-16 DIAGNOSIS — J90 Pleural effusion, not elsewhere classified: Secondary | ICD-10-CM

## 2022-04-16 DIAGNOSIS — I440 Atrioventricular block, first degree: Secondary | ICD-10-CM | POA: Diagnosis present

## 2022-04-16 DIAGNOSIS — R0902 Hypoxemia: Secondary | ICD-10-CM | POA: Diagnosis not present

## 2022-04-16 DIAGNOSIS — C642 Malignant neoplasm of left kidney, except renal pelvis: Secondary | ICD-10-CM | POA: Diagnosis present

## 2022-04-16 DIAGNOSIS — Z808 Family history of malignant neoplasm of other organs or systems: Secondary | ICD-10-CM

## 2022-04-16 DIAGNOSIS — J939 Pneumothorax, unspecified: Secondary | ICD-10-CM | POA: Diagnosis not present

## 2022-04-16 DIAGNOSIS — Z823 Family history of stroke: Secondary | ICD-10-CM

## 2022-04-16 DIAGNOSIS — R54 Age-related physical debility: Secondary | ICD-10-CM | POA: Diagnosis present

## 2022-04-16 DIAGNOSIS — R188 Other ascites: Secondary | ICD-10-CM | POA: Diagnosis present

## 2022-04-16 DIAGNOSIS — L89152 Pressure ulcer of sacral region, stage 2: Secondary | ICD-10-CM | POA: Diagnosis present

## 2022-04-16 DIAGNOSIS — Z9889 Other specified postprocedural states: Secondary | ICD-10-CM

## 2022-04-16 DIAGNOSIS — Z7401 Bed confinement status: Secondary | ICD-10-CM | POA: Diagnosis not present

## 2022-04-16 DIAGNOSIS — I10 Essential (primary) hypertension: Secondary | ICD-10-CM | POA: Diagnosis present

## 2022-04-16 DIAGNOSIS — E440 Moderate protein-calorie malnutrition: Secondary | ICD-10-CM | POA: Insufficient documentation

## 2022-04-16 LAB — CBC WITH DIFFERENTIAL/PLATELET
Abs Immature Granulocytes: 0.02 10*3/uL (ref 0.00–0.07)
Basophils Absolute: 0 10*3/uL (ref 0.0–0.1)
Basophils Relative: 0 %
Eosinophils Absolute: 0 10*3/uL (ref 0.0–0.5)
Eosinophils Relative: 0 %
HCT: 40.9 % (ref 36.0–46.0)
Hemoglobin: 12.4 g/dL (ref 12.0–15.0)
Immature Granulocytes: 0 %
Lymphocytes Relative: 9 %
Lymphs Abs: 0.6 10*3/uL — ABNORMAL LOW (ref 0.7–4.0)
MCH: 31.1 pg (ref 26.0–34.0)
MCHC: 30.3 g/dL (ref 30.0–36.0)
MCV: 102.5 fL — ABNORMAL HIGH (ref 80.0–100.0)
Monocytes Absolute: 0.7 10*3/uL (ref 0.1–1.0)
Monocytes Relative: 11 %
Neutro Abs: 4.6 10*3/uL (ref 1.7–7.7)
Neutrophils Relative %: 80 %
Platelets: 245 10*3/uL (ref 150–400)
RBC: 3.99 MIL/uL (ref 3.87–5.11)
RDW: 16.8 % — ABNORMAL HIGH (ref 11.5–15.5)
WBC: 5.9 10*3/uL (ref 4.0–10.5)
nRBC: 0 % (ref 0.0–0.2)

## 2022-04-16 LAB — COMPREHENSIVE METABOLIC PANEL
ALT: 33 U/L (ref 0–44)
AST: 46 U/L — ABNORMAL HIGH (ref 15–41)
Albumin: 2.7 g/dL — ABNORMAL LOW (ref 3.5–5.0)
Alkaline Phosphatase: 80 U/L (ref 38–126)
Anion gap: 6 (ref 5–15)
BUN: 27 mg/dL — ABNORMAL HIGH (ref 8–23)
CO2: 35 mmol/L — ABNORMAL HIGH (ref 22–32)
Calcium: 8.6 mg/dL — ABNORMAL LOW (ref 8.9–10.3)
Chloride: 97 mmol/L — ABNORMAL LOW (ref 98–111)
Creatinine, Ser: 0.64 mg/dL (ref 0.44–1.00)
GFR, Estimated: >60 mL/min (ref >60)
Glucose, Bld: 118 mg/dL — ABNORMAL HIGH (ref 70–99)
Potassium: 4.1 mmol/L (ref 3.5–5.1)
Sodium: 138 mmol/L (ref 135–145)
Total Bilirubin: 0.4 mg/dL (ref 0.3–1.2)
Total Protein: 6.3 g/dL — ABNORMAL LOW (ref 6.5–8.1)

## 2022-04-16 LAB — BRAIN NATRIURETIC PEPTIDE: B Natriuretic Peptide: 1669 pg/mL — ABNORMAL HIGH (ref 0.0–100.0)

## 2022-04-16 LAB — PROCALCITONIN: Procalcitonin: 0.19 ng/mL

## 2022-04-16 LAB — LACTIC ACID, PLASMA: Lactic Acid, Venous: 1.2 mmol/L (ref 0.5–1.9)

## 2022-04-16 MED ORDER — METOPROLOL TARTRATE 25 MG PO TABS
12.5000 mg | ORAL_TABLET | Freq: Two times a day (BID) | ORAL | Status: DC
  Administered 2022-04-17 – 2022-04-19 (×4): 12.5 mg via ORAL
  Filled 2022-04-16 (×5): qty 1

## 2022-04-16 MED ORDER — ROSUVASTATIN CALCIUM 10 MG PO TABS
10.0000 mg | ORAL_TABLET | Freq: Every day | ORAL | Status: DC
  Administered 2022-04-17 – 2022-04-19 (×3): 10 mg via ORAL
  Filled 2022-04-16 (×4): qty 1

## 2022-04-16 MED ORDER — TRAMADOL HCL 50 MG PO TABS
50.0000 mg | ORAL_TABLET | Freq: Four times a day (QID) | ORAL | Status: DC | PRN
  Administered 2022-04-17 – 2022-04-19 (×6): 50 mg via ORAL
  Filled 2022-04-16 (×6): qty 1

## 2022-04-16 MED ORDER — FUROSEMIDE 10 MG/ML IJ SOLN
40.0000 mg | Freq: Once | INTRAMUSCULAR | Status: AC
  Administered 2022-04-16: 40 mg via INTRAVENOUS
  Filled 2022-04-16: qty 4

## 2022-04-16 MED ORDER — LEVOTHYROXINE SODIUM 25 MCG PO TABS
25.0000 ug | ORAL_TABLET | Freq: Every day | ORAL | Status: DC
  Administered 2022-04-17 – 2022-04-20 (×4): 25 ug via ORAL
  Filled 2022-04-16 (×4): qty 1

## 2022-04-16 MED ORDER — GABAPENTIN 100 MG PO CAPS
100.0000 mg | ORAL_CAPSULE | Freq: Three times a day (TID) | ORAL | Status: DC
  Administered 2022-04-17 – 2022-04-20 (×11): 100 mg via ORAL
  Filled 2022-04-16 (×11): qty 1

## 2022-04-16 MED ORDER — MIRABEGRON ER 25 MG PO TB24
25.0000 mg | ORAL_TABLET | Freq: Every day | ORAL | Status: DC
  Administered 2022-04-18 – 2022-04-20 (×3): 25 mg via ORAL
  Filled 2022-04-16 (×4): qty 1

## 2022-04-16 MED ORDER — IOHEXOL 350 MG/ML SOLN
75.0000 mL | Freq: Once | INTRAVENOUS | Status: AC | PRN
  Administered 2022-04-16: 75 mL via INTRAVENOUS

## 2022-04-16 MED ORDER — POLYETHYLENE GLYCOL 3350 17 G PO PACK: 17.0000 g | PACK | Freq: Two times a day (BID) | ORAL | Status: DC | PRN

## 2022-04-16 MED ORDER — ONDANSETRON HCL 4 MG PO TABS: 4.0000 mg | ORAL_TABLET | Freq: Four times a day (QID) | ORAL | Status: DC | PRN

## 2022-04-16 MED ORDER — ONDANSETRON HCL 4 MG/2ML IJ SOLN: 4.0000 mg | Freq: Four times a day (QID) | INTRAMUSCULAR | Status: DC | PRN

## 2022-04-16 MED ORDER — LACTATED RINGERS IV BOLUS: 1000.0000 mL | Freq: Once | INTRAVENOUS | Status: DC

## 2022-04-16 MED ORDER — ACETAMINOPHEN 650 MG RE SUPP: 650.0000 mg | Freq: Four times a day (QID) | RECTAL | Status: AC | PRN

## 2022-04-16 MED ORDER — ACETAMINOPHEN 325 MG PO TABS
650.0000 mg | ORAL_TABLET | Freq: Four times a day (QID) | ORAL | Status: AC | PRN
  Administered 2022-04-18 – 2022-04-19 (×3): 650 mg via ORAL
  Filled 2022-04-16 (×3): qty 2

## 2022-04-16 MED ORDER — SILDENAFIL CITRATE 20 MG PO TABS
20.0000 mg | ORAL_TABLET | Freq: Three times a day (TID) | ORAL | Status: DC
Start: 2022-04-17 — End: 2022-04-19
  Administered 2022-04-17 – 2022-04-19 (×6): 20 mg via ORAL
  Filled 2022-04-16 (×8): qty 1

## 2022-04-16 MED ORDER — TRAMADOL HCL 50 MG PO TABS: 50.0000 mg | ORAL_TABLET | Freq: Four times a day (QID) | ORAL | 0 refills | Status: AC | PRN

## 2022-04-16 MED ORDER — LACTATED RINGERS IV BOLUS: 500.0000 mL | Freq: Once | INTRAVENOUS | Status: DC

## 2022-04-16 MED ORDER — ENOXAPARIN SODIUM 40 MG/0.4ML IJ SOSY: 40.0000 mg | PREFILLED_SYRINGE | INTRAMUSCULAR | Status: DC

## 2022-04-16 MED ORDER — ASPIRIN EC 81 MG PO TBEC
81.0000 mg | DELAYED_RELEASE_TABLET | Freq: Every day | ORAL | Status: DC
  Administered 2022-04-18 – 2022-04-20 (×3): 81 mg via ORAL
  Filled 2022-04-16 (×4): qty 1

## 2022-04-16 NOTE — Telephone Encounter (Signed)
Can you contact the patient's son and clarify what equipment they are asking for? ? ?I am unfamiliar with what a Web designer is. ? ?Thank you ? ?Nobie Putnam, DO ?[facility] ?[facility] ?04/13/2022, 8:18 AM ? ?

## 2022-04-16 NOTE — Discharge Summary (Signed)
?Physician Discharge Summary ?  ?Patient: Jennifer Skinner MRN: [id number] DOB: [birth year]  ?Admit date:     04/13/2022  ?Discharge date: 04/17/2022  ?Discharge Physician: Fritzi Mandes  ? ?PCP: Olin Hauser, DO  ? ?Recommendations at discharge:  ? ?follow-up Dr. Sharlet Salina orthopedic in three weeks ?oxygen 2 L per minute to keep sats greater than 92% ? ?Discharge Diagnoses: ? ?left proximal fibula nondisplaced fracture-- conservative treatment ? ?Hospital Course: ?MIYANNA Skinner is a 88 y.o. Caucasian female with medical history significant for COPD, GERD, dyslipidemia mitral valve prolapse and regurgitation, osteoporosis and lumbar degenerative disc disease, who presented to the ER with acute onset of left leg swelling and pain.  She is not sure if she fell or hit her left leg.  She has not been able to ambulate secondarily.  She lives alone.  She is on home O2 at 3 L/min and her pulse oximetry has been dropping ?  ?left proximal fibula nondisplaced fracture ?-- continue knee immobilizer ?-- orthopedic consultation with Dr. Sharlet Salina noted-- okay to start PT with weight-bearing as tolerated ?-- prn pain meds ?-- TOC for discharge planning to rehab ?  ?chronic diastolic congestive heart failure ?emphysema/COPD/pulmonary fibrosis/pulmonary hypertension ?-- patient has been noncompliant to oxygen at home per son ?-- continue torsemide, revatio ?  ?Hypothyroidism ?-- Synthroid ?  ?pulmonary hypertension ?--cont revatio ?  ?dyslipidemia continue statins ?  ?overall medically best at baseline. Will discharged to rehab discussed with son Mallary Kreger at bedside ?  ? ?  ? ? ?Consultants: Ortho ?Procedures performed: none  ?Disposition: Skilled nursing facility ?Diet recommendation:  ?Discharge Diet Orders (From admission, onward)  ? ?  Start     Ordered  ? 04/15/2022 0000  Diet - low sodium heart healthy       ? 05/09/2022 1441  ? ?  ?  ? ?  ? ?Cardiac diet ?DISCHARGE MEDICATION: ?Allergies as of 04/10/2022   ? ?   Reactions  ?  Penicillins   ? Rash   ? ?  ? ?  ?Medication List  ?  ? ?STOP taking these medications   ? ?potassium chloride 10 MEQ tablet ?Commonly known as: KLOR-CON M ?  ? ?  ? ?TAKE these medications   ? ?acetaminophen 500 MG tablet ?Commonly known as: TYLENOL ?Take 500 mg by mouth every 6 (six) hours as needed. ?  ?aspirin EC 81 MG tablet ?Take 81 mg by mouth daily. ?  ?gabapentin 100 MG capsule ?Commonly known as: NEURONTIN ?Take 1 capsule (100 mg total) by mouth 3 (three) times daily. ?  ?levothyroxine 25 MCG tablet ?Commonly known as: SYNTHROID ?Take 1 tablet (25 mcg total) by mouth daily before breakfast. ?  ?metoprolol tartrate 25 MG tablet ?Commonly known as: LOPRESSOR ?Take 0.5 tablets (12.5 mg total) by mouth 2 (two)times daily. ?  ?mirabegron ER 25 MG Tb24 tablet ?Commonly known as: Myrbetriq ?Take 1 tablet (25 mg total) by mouth daily. ?  ?polyethylene glycol powder 17 GM/SCOOP powder ?Commonly known as: GLYCOLAX/MIRALAX ?Take 17-34 g by mouth daily as needed. ?  ?rosuvastatin 10 MG tablet ?Commonly known as: CRESTOR ?Take 1 tablet (10 mg total) by mouth at bedtime. ?  ?sildenafil 20 MG tablet ?Commonly known as: REVATIO ?Take 1 tablet (20 mg total) by mouth 3 (three) times daily. For pulmonary hypertension ?  ?torsemide 20 MG tablet ?Commonly known as: DEMADEX ?Take 2 tablets (40 mg total) by mouth 2 (two) times daily. May take extra 20 mg as needed  for shortness of breath ?  ?traMADol 50 MG tablet ?Commonly known as: ULTRAM ?Take 1 tablet (50 mg total) by mouth every 6 (six) hours as needed for moderate pain. Take up to max 6 pills per 24 hours. ?What changed: how much to take ?  ? ?  ? ? Follow-up Information   ? ? Olin Hauser, DO. Schedule an appointment as soon as possible for a visit in 1 week(s).   ?Specialty: Family Medicine ?Why: hospital f/u ?Contact information: ?[address] [place] [address] ?[phone] ? ? ?  ?  ? ? Minna Merritts, MD .   ?Specialty: Cardiology ?Contact  information: ?[address] ?[place] [address] ?[phone] ? ? ?  ?  ? ? Renee Harder, MD. Schedule an appointment as soon as possible for a visit in 3 week(s).   ?Specialty: Orthopedic Surgery ?Why: f/u fibula fracture ?Contact information: ?[address] ?[place] [address] ?[phone] ? ? ?  ?  ? ?  ?  ? ?  ? ?Discharge Exam: ?Filed Weights  ? 04/14/22 0500 04/15/22 0500 04/23/2022 0500  ?Weight: 37.4 kg 37 kg 37 kg  ? ? ? ?Condition at discharge: fair ? ?The results of significant diagnostics from this hospitalization (including imaging, microbiology, ancillary and laboratory) are listed below for reference.  ? ?Imaging Studies: ?DG Chest 1 View ? ?Result Date: 04/13/2022 ?CLINICAL DATA:  Shortness of breath and leg swelling. EXAM: CHEST  1 VIEW COMPARISON:  Radiograph 01/07/2018 FINDINGS: Chronic cardiomegaly. Unchanged mediastinal contours with aortic atherosclerosis and tortuosity. Increased right pleural effusion and basilar opacity from 2019 exam. There is a small left pleural effusion with streaky left lung base opacities. Vascular congestion with possible central perihilar edema. No pneumothorax. Remote left rib fractures. The bones are under mineralized IMPRESSION: 1. Chronic cardiomegaly. Increased right pleural effusion and basilar opacity from 2019 exam, likely combination of pleural fluid and atelectasis. 2. Small left pleural effusion with streaky left lung base opacities, likely atelectasis. 3. Vascular congestion with possible central perihilar edema. Electronically Signed   By: Keith Rake M.D.   On: 04/13/2022 17:16  ? ?DG Knee Complete 4 Views Left ? ?Result Date: 04/12/2022 ?CLINICAL DATA:  Chronic left knee pain, and notes unable to bear much weight. EXAM: LEFT KNEE - COMPLETE 4+ VIEW COMPARISON:  None Available. FINDINGS: Acute to subacute minimally displaced fracture involving the fibular head and neck. Soft tissue swelling. No dislocation IMPRESSION:  Acute to subacute minimally displaced fracture involving the fibular head and neck. These results will be called to the ordering clinician or representative by the Radiologist Assistant, and communication documented in the PACS or [employer]. Electronically Signed   By: Donavan Foil M.D.   On: 04/12/2022 21:48   ? ?Microbiology: ?Results for orders placed or performed in visit on 05/05/20  ?Urine Culture     Status: None  ? Collection Time: 05/05/20 11:44 AM  ? Specimen: Urine  ?Result Value Ref Range Status  ? MICRO NUMBER: [id number]  Final  ? SPECIMEN QUALITY: Adequate  Final  ? Sample Source URINE, CLEAN CATCH  Final  ? STATUS: FINAL  Final  ? ISOLATE 1:   Final  ?  Growth of mixed flora was isolated, suggesting probable contamination. No further testing will be performed. If clinically indicated, recollection using a method to minimize contamination, with prompt transfer to Urine Culture Transport Tube, is  ?recommended. ?  ? ? ?Labs: ?CBC: ?Recent Labs  ?Lab  04/13/22 ?1529 04/14/22 ?1308  ?WBC 5.8 5.0  ?NEUTROABS 4.4  --   ?HGB 12.4 12.6  ?HCT 40.6 41.6  ?MCV 100.2* 100.7*  ?PLT 267 251  ? ?Basic Metabolic Panel: ?Recent Labs  ?Lab 04/12/22 ?1011 04/13/22 ?1529 04/14/22 ?6578  ?NA 140 139 141  ?K 3.4* 4.2 3.2*  ?CL 99 98 101  ?CO2 31 33* 34*  ?GLUCOSE 144* 78 78  ?BUN 37* 41* 34*  ?CREATININE 0.83 0.77 0.66  ?CALCIUM 8.1* 8.4* 8.2*  ? ?Liver Function Tests: ?Recent Labs  ?Lab 04/13/22 ?1529  ?AST 32  ?ALT 24  ?ALKPHOS 97  ?BILITOT 0.5  ?PROT 6.6  ?ALBUMIN 2.8*  ? ?Discharge time spent: greater than 30 minutes. ? ?Signed: ?Fritzi Mandes, MD ?[facility] ?04/27/2022 ?

## 2022-04-16 NOTE — Plan of Care (Signed)

## 2022-04-16 NOTE — Discharge Instructions (Signed)
Oxygen 2L/min to keep sats >92% ?

## 2022-04-16 NOTE — Telephone Encounter (Signed)
I have not but I can. What exactly is a transport cart? ?

## 2022-04-16 NOTE — TOC Progression Note (Signed)
Transition of Care (TOC) - Progression Note  ? ? ?Patient Details  ?Name: Jennifer Skinner ?MRN: [id number] ?Date of Birth: Apr 19, 1931 ? ?Transition of Care (TOC) CM/SW Contact  ?Conception Oms, RN ?Phone Number: ?04/28/2022, 3:02 PM ? ?Clinical Narrative:    ?Patient going to [facility] room E[facility], The son is currently at [facility] filling out the paperwork, She will transport via EMS  ?EMS called and she is number 1 on the list ? ? ? ?Expected Discharge Plan: [place] ?Barriers to Discharge: Continued Medical Work up ? ?Expected Discharge Plan and Services ?Expected Discharge Plan: [place] ?  ?  ?  ?Living arrangements for the past 2 months: [place] ?Expected Discharge Date: 04/27/2022               ?  ?  ?  ?  ?  ?  ?  ?  ?  ?  ? ? ?Social Determinants of Health (SDOH) Interventions ?  ? ?Readmission Risk Interventions ?   ? View : No data to display.  ?  ?  ?  ? ? ?

## 2022-04-16 NOTE — H&P (Addendum)
?History and Physical  ? ?Jennifer Skinner TDV:[id number] DOB: [birth year] DOA: 04/19/2022 ? ?PCP: Olin Hauser, DO  ?Outpatient Specialists: Dr. Marius Ditch, gastroenterology ?Patient coming from: [facility] via EMS ? ?I have personally briefly reviewed patient's old medical records in [place]. ? ?Chief Concern: Shortness of breath ? ?HPI: [name] is a 88 year old female with neuropathy, hypothyroid, hypertension, pulmonary hypertension, hyperlipidemia, COPD on 4 L nasal cannula, GERD, who presents emergency department from [place] facility for chief concerns of shortness of breath. ? ?Per report: [facility] care staff was not able to get patient's SPO2 greater than 70% on her baseline 4 L nasal cannula.  When EMS arrived, they got inconsistent readings between 70-88 percent on 5 L nasal cannula.  On arrival to the emergency department patient was on 5 L and satting at 77%.  Patient was placed on nonrebreather with improvement to 89% ? ?Initial vitals in the emergency department show temperature of 98, respiration rate of 31, heart rate 99, blood pressure 130/57, SPO2 of 96% on 15 L nonrebreather. ? ?Serum sodium 138, potassium 4.1, chloride 97, bicarb 35, BUN 27, serum creatinine of 0.64, GFR greater than 60, nonfasting blood glucose 118, WBC 5.9, hemoglobin 12.2, platelets of 245. ? ?BNP was elevated at 1669.  Lactic acid is 1.2.  Procalcitonin is 0.19. ? ?ED treatment: Furosemide 40 mg IV. ?At bedside patient is awake alert and oriented to self, age, location and current calendar year. ? ?She states that this time her left leg is hurting her the most and is bothering her the most.  She denies endorsing shortness of breath or chest pain, cough, abdominal pain, dysuria, hematuria. ? ?Social history: She is from [employer] care.  She previously lived at home by herself as her husband of 82 years passed away in 09/04/21.  She denies history of tobacco, EtOH, recreational  drug use. ? ?ROS: ?Constitutional: no weight change, no fever ?ENT/Mouth: no sore throat, no rhinorrhea ?Eyes: no eye pain, no vision changes ?Cardiovascular: no chest pain, no dyspnea,  no edema, no palpitations ?Respiratory: no cough, no sputum, no wheezing ?Gastrointestinal: no nausea, no vomiting, no diarrhea, no constipation ?Genitourinary: no urinary incontinence, no dysuria, no hematuria ?Musculoskeletal: no arthralgias, no myalgias, left leg pain ?Skin: no skin lesions, no pruritus, ?Neuro: + weakness, no loss of consciousness, no syncope ?Psych: no anxiety, no depression, no decrease appetite ?Heme/Lymph: no bruising, no bleeding ? ?ED Course: Discussed with emergency medicine provider, patient requiring hospitalization for chief concerns of shortness of breath. ? ?Assessment/Plan ? ?Principal Problem: ?  Pleural effusion due to CHF (congestive heart failure) ([place]) ?Active Problems: ?  Acute on chronic diastolic CHF (congestive heart failure) ([place]) ?  Acute on chronic respiratory failure with hypoxia (H[facility]) ?  Hypothyroidism ?  GERD ?  Essential hypertension ?  Pulmonary HTN ([place]) ?  Protein-calorie malnutrition, severe ([place]) ?  Dyslipidemia ?  Peripheral neuropathy ?  Acute exacerbation of CHF (congestive heart failure) ([place]) ?  ?Assessment and Plan: ?* Pleural effusion due to CHF (congestive heart failure) ([place]) ?- Right greater than the left ?- Ultrasound-guided thoracentesis ordered for the right pleural effusion placed with labs ? ?Acute on chronic diastolic CHF (congestive heart failure) ([place]) ?- Presumed secondary to moderate pleural effusion right greater than left ?- Ultrasound-guided thoracentesis has been ordered ? ?Hypothyroidism ?- Levothyroxine 25 mcg daily before breakfast resumed ? ?Peripheral neuropathy ?- Resumed home gabapentin 100 mg p.o. 3 times daily ? ?  Dyslipidemia ?- Rosuvastatin 10 mg nightly resumed ? ?Protein-calorie malnutrition, severe ([place]) ?- Dietary has been  consulted ? ?Pulmonary HTN ([place]) ?- I have resumed sildenafil 20 mg p.o. 3 times daily ? ?Essential hypertension ?- Metoprolol tartrate 12.5 mg p.o. twice daily resumed ? ?Pharmacologic DVT prophylaxis has not been initiated by myself due to anticipation of ultrasound-guided thoracentesis ?- AM team to order pharmacologic DVT prophylaxis when its benefits outweigh the risk ? ?Chart reviewed.  ? ?DVT prophylaxis: None, TED hose ?Code Status: DNR/DNI ?Diet: Heart healthy ?Family Communication: No ?Disposition Plan: Pending clinical course ?Consults called: None at this time ?Admission status: Telemetry cardiac ? ?Past Medical History:  ?Diagnosis Date  ? 1st degree AV block   ? Arthritis   ? Chronic diastolic CHF (congestive heart failure) ([employer])   ? a. echo 2014: EF 55-60%, nl wall motion, GR1DD, mild MR, moder mitral prolapse, PASP 40 mm Hg; b. echo 09/2015: EF 60-65%, nl wall motion, GR1DD, mod MR, mild to mod TR, PASP 56 mm Hg  ? COPD (chronic obstructive pulmonary disease) ([place])   ? Degenerative disc disease, lumbar   ? Gastro-esophageal reflux   ? Hyperlipidemia   ? Mitral prolapse   ? Mitral regurgitation   ? Osteoporosis   ? ?Past Surgical History:  ?Procedure Laterality Date  ? ABDOMINAL HYSTERECTOMY    ? APPENDECTOMY    ? DILATION AND CURETTAGE OF UTERUS    ? HYSTEROTOMY    ? right breast biopsy    ? right eye     ? surgery  ? TONSILLECTOMY    ? ?Social History:  reports that she has never smoked. She has never used smokeless tobacco. She reports that she does not drink alcohol and does not use drugs. ? ?Allergies  ?Allergen Reactions  ? Penicillins   ?  Rash   ? ?Family History  ?Problem Relation Age of Onset  ? CVA Mother 63  ? Heart attack Father 49  ? Throat cancer Sister   ? CAD Brother   ? ?Family history: Family history reviewed and not pertinent ? ?Prior to Admission medications   ?Medication Sig Start Date End Date Taking? Authorizing Provider  ?acetaminophen (TYLENOL) 500 MG tablet Take 500 mg by  mouth every 6 (six) hours as needed.   Yes [provider]  ?aspirin EC 81 MG tablet Take 81 mg by mouth daily. 08/18/18  Yes Minna Merritts, MD  ?gabapentin (NEURONTIN) 100 MG capsule Take 1 capsule (100 mg total) by mouth 3 (three) times daily. 07/25/21  Yes Karamalegos, Devonne Doughty, DO  ?levothyroxine (SYNTHROID) 25 MCG tablet Take 1 tablet (25 mcg total) by mouth daily before breakfast. 07/25/21  Yes Karamalegos, Devonne Doughty, DO  ?metoprolol tartrate (LOPRESSOR) 25 MG tablet Take 0.5 tablets (12.5 mg total) by mouth 2 (two)times daily. 02/15/22  Yes Karamalegos, Devonne Doughty, DO  ?mirabegron ER (MYRBETRIQ) 25 MG TB24 tablet Take 1 tablet (25 mg total) by mouth daily. 07/25/21  Yes Karamalegos, Devonne Doughty, DO  ?polyethylene glycol powder (GLYCOLAX/MIRALAX) powder Take 17-34 g by mouth daily as needed. 08/19/17  Yes Karamalegos, Devonne Doughty, DO  ?potassium chloride (KLOR-CON) 10 MEQ tablet Take 10 mEq by mouth daily. 04/13/22  Yes [provider]  ?rosuvastatin (CRESTOR) 10 MG tablet Take 1 tablet (10 mg total) by mouth at bedtime. 07/25/21  Yes Karamalegos, Devonne Doughty, DO  ?sildenafil (REVATIO) 20 MG tablet Take 1 tablet (20 mg total) by mouth 3 (three) times daily. For pulmonary hypertension 02/15/22  Yes Olin Hauser, DO  ?torsemide (DEMADEX) 20 MG tablet Take 2 tablets (40 mg total) by mouth 2 (two) times daily. May take extra 20 mg as needed for shortness of breath 12/13/21  Yes Gollan, Kathlene November, MD  ?traMADol (ULTRAM) 50 MG tablet Take 1 tablet (50 mg total) by mouth every 6 (six) hours as needed for moderate pain. Take up to max 6 pills per 24 hours. 04/15/2022  Yes Fritzi Mandes, MD  ? ? ?Physical Exam: ?Vitals:  ? 04/09/2022 2030 04/23/2022 2100 04/21/2022 2130 05/06/2022 2200  ?BP: (!) 120/51 (!) 130/57 (!) 149/65 (!) 178/81  ?Pulse: 91 99  (!) 104  ?Resp:  (!) 31 (!) 21 (!) 31  ?Temp:      ?TempSrc:      ?SpO2: 90% 96%  (!) 87%  ? ?Constitutional: appears age-appropriate, frail, cachectic  appearing, malnourished, NAD, calm, comfortable ?Eyes: PERRL, lids and conjunctivae normal ?ENMT: Mucous membranes are moist. Posterior pharynx clear of any exudate or lesions. Age-appropriate dentition. Hearing appropriate ?Neck

## 2022-04-16 NOTE — Progress Notes (Signed)
Iv removed site c/d/I pt went with all belongings and transported via EMS to [facility], report successfully called to facility and  they are expecting her  ?

## 2022-04-16 NOTE — ED Triage Notes (Signed)
Pt arrived via [facility] from [facility] where staff could not get a oxygen sat over 70% with pt on her baseline 4L of oxygen. EMS reported reading inconsistent between 70-88% on 5L. On arrival pt on 5L oxygen with sat at 77%. Pt placed on non-re breather with improvement to 89%. Pt denies SOB, no work of breathing noted, pt answering all questions appropriately and without difficulty.  Pt in fetal position at baseline and refuses to lay on her back. Pt sts, "I dont want to be back here, I just left."  ?

## 2022-04-16 NOTE — TOC Progression Note (Signed)
Transition of Care (TOC) - Progression Note  ? ? ?Patient Details  ?Name: Jennifer Skinner ?MRN: [id number] ?Date of Birth: [birth year] ? ?Transition of Care (TOC) CM/SW Contact  ?Conception Oms, RN ?Phone Number: ?04/28/2022, 12:22 PM ? ?Clinical Narrative:    ? ?Son Elta [place] came and asked me to get Ins approval for EMS, as well, I called [facility] [employer] and left a secure VM asking for EMS approval ? ?Expected Discharge Plan: [place] ?Barriers to Discharge: Continued Medical Work up ? ?Expected Discharge Plan and Services ?Expected Discharge Plan: [place] ?  ?  ?  ?Living arrangements for the past 2 months: [place] ?                ?  ?  ?  ?  ?  ?  ?  ?  ?  ?  ? ? ?Social Determinants of Health (SDOH) Interventions ?  ? ?Readmission Risk Interventions ?   ? View : No data to display.  ?  ?  ?  ? ? ?

## 2022-04-16 NOTE — TOC Progression Note (Signed)
Transition of Care (TOC) - Progression Note  ? ? ?Patient Details  ?Name: Jennifer Skinner ?MRN: [id number] ?Date of Birth: 1930-12-26 ? ?Transition of Care (TOC) CM/SW Contact  ?Conception Oms, RN ?Phone Number: ?04/21/2022, 10:21 AM ? ?Clinical Narrative:    ?Reviewed bed offers with the patient and her Son, They chose [facility], I notified Ricky at [place], [facility] to get Ins approval started, spoke with Marlowe Kays. Marland KitchenHer son wants to transport her to rehab ? ? ?Expected Discharge Plan: [place] ?Barriers to Discharge: Continued Medical Work up ? ?Expected Discharge Plan and Services ?Expected Discharge Plan: [place] ?  ?  ?  ?Living arrangements for the past 2 months: [place] ?                ?  ?  ?  ?  ?  ?  ?  ?  ?  ?  ? ? ?Social Determinants of Health (SDOH) Interventions ?  ? ?Readmission Risk Interventions ?   ? View : No data to display.  ?  ?  ?  ? ? ?

## 2022-04-16 NOTE — ED Notes (Signed)
Pt placed on Non-Re Breather at 15L with [place] in place still going at 5L.  ?

## 2022-04-16 NOTE — ED Provider Notes (Signed)
? ?[facility] ?Provider Note ? ? ? Event Date/Time  ? First MD Initiated Contact with Patient 05/08/2022 2024   ?  (approximate) ? ? ?History  ? ?Shortness of Breath ? ? ?HPI ? ?Jennifer Skinner is a 88 y.o. female with past medical history of COPD, GERD, mitral valve prolapse with regurgitation, lumbar degenerative disc disease, here with shortness of breath.  Patient was actually just admitted for left fibula nondisplaced fracture and sent just several hours ago.  She reportedly was hypoxic per review of records, but had a difficult to assess pulse ox.  She reportedly was found severely hypoxic and dyspneic by the facility, and was sent to the ED.  She currently is actually without complaints.  She denies any shortness of breath.  Denies any significant pain other than mild pain of her left leg from the fall.  No chest pain. ?  ? ? ?Physical Exam  ? ?Triage Vital Signs: ?ED Triage Vitals [04/10/2022 2010]  ?Enc Vitals Group  ?   BP (!) 85/50  ?   Pulse Rate (!) 104  ?   Resp (!) 21  ?   Temp (!) 97.5 ?F (36.4 ?C)  ?   Temp Source Axillary  ?   SpO2 (!) 77 %  ?   Weight   ?   Height   ?   Head Circumference   ?   Peak Flow   ?   Pain Score   ?   Pain Loc   ?   Pain Edu?   ?   Excl. in [place]?   ? ? ?Most recent vital signs: ?Vitals:  ? 04/15/2022 2130 04/25/2022 2200  ?BP: (!) 149/65 (!) 178/81  ?Pulse:  (!) 104  ?Resp: (!) 21 (!) 31  ?Temp:    ?SpO2:  (!) 87%  ? ? ? ?General: Awake, chronically ill-appearing. ?CV:  Good peripheral perfusion.  Tachycardic, systolic murmur. ?Resp:  Tachypneic, bilateral rails with diminished lung sounds in the bases. ?Abd:  No distention.  ?Other:  Trace edema bilaterally. ? ? ?ED Results / Procedures / Treatments  ? ?Labs ?(all labs ordered are listed, but only abnormal results are displayed) ?Labs Reviewed  ?CBC WITH DIFFERENTIAL/PLATELET - Abnormal; Notable for the following components:  ?    Result Value  ? MCV 102.5 (*)   ? RDW 16.8 (*)   ? Lymphs Abs 0.6 (*)   ? All  other components within normal limits  ?COMPREHENSIVE METABOLIC PANEL - Abnormal; Notable for the following components:  ? Chloride 97 (*)   ? CO2 35 (*)   ? Glucose, Bld 118 (*)   ? BUN 27 (*)   ? Calcium 8.6 (*)   ? Total Protein 6.3 (*)   ? Albumin 2.7 (*)   ? AST 46 (*)   ? All other components within normal limits  ?BRAIN NATRIURETIC PEPTIDE - Abnormal; Notable for the following components:  ? B Natriuretic Peptide 1,669.0 (*)   ? All other components within normal limits  ?PROCALCITONIN  ?LACTIC ACID, PLASMA  ?LACTIC ACID, PLASMA  ? ? ? ?EKG ?Sinus tachycardia, ventricular rate 103.  PR 135, QRS 90, QTc 425.  No acute ST elevations or depressions. ? ? ?RADIOLOGY ?CT angio PE: No PE, moderate cardiomegaly, bilateral pleural effusions with compressive atelectasis, emphysema, possible primary renal neoplasm ? ? ?I also independently reviewed and agree with radiologist interpretations. ? ? ?PROCEDURES: ? ?Critical Care performed: Yes, see critical care procedure note(s) ? ?.  Critical Care ?Performed by: Duffy Bruce, MD ?Authorized by: Duffy Bruce, MD  ? ?Critical care provider statement:  ?  Critical care time (minutes):  30 ?  Critical care time was exclusive of:  Separately billable procedures and treating other patients ?  Critical care was necessary to treat or prevent imminent or life-threatening deterioration of the following conditions:  Cardiac failure, circulatory failure and respiratory failure ?  Critical care was time spent personally by me on the following activities:  Development of treatment plan with patient or surrogate, discussions with consultants, evaluation of patient's response to treatment, examination of patient, ordering and review of laboratory studies, ordering and review of radiographic studies, ordering and performing treatments and interventions, pulse oximetry, re-evaluation of patient's condition and review of old charts ? ? ? ?MEDICATIONS ORDERED IN ED: ?Medications   ?furosemide (LASIX) injection 40 mg (has no administration in time range)  ?iohexol (OMNIPAQUE) 350 MG/ML injection 75 mL (75 mLs Intravenous Contrast Given 05/09/2022 2215)  ? ? ? ?IMPRESSION / MDM / ASSESSMENT AND PLAN / ED COURSE  ?I reviewed the triage vital signs and the nursing notes. ?             ?               ? ? ?The patient is on the cardiac monitor to evaluate for evidence of arrhythmia and/or significant heart rate changes. ? ? ?Ddx:  ?Differential includes the following, with pertinent life- or limb-threatening emergencies considered: ? ?CHF exacerbation, PE, PNA, PTX, atelectasis ? ? ?MDM:  ?88 yo F here with acute on chronic significant hypoxic resp failure. Pt just discharged from hospital.  Reviewed discharge notes, which note admission for fibular fracture.  She was continued on her home oxygen.  Per report, she was intermittently hypoxic today noted by physical therapy.  Here, she is profoundly hypoxic.  She was placed on high flow nasal cannula with improvement.  CT angio shows no PE, but does show significant compressive atelectasis in bilateral effusions.  Patient has mild ascites.  Will start IV diuresis and plan to admit.  No PE.  No clinical signs to suggest pneumonia.  Lab work shows elevated BNP.  CMP with baseline renal function.  CBC without significant leukocytosis or anemia.  Procalcitonin negative.  Lactic acid normal. ? ? ?MEDICATIONS GIVEN IN ED: ?Medications  ?furosemide (LASIX) injection 40 mg (has no administration in time range)  ?iohexol (OMNIPAQUE) 350 MG/ML injection 75 mL (75 mLs Intravenous Contrast Given 04/19/2022 2215)  ? ? ? ?Consults:  ?Hospitalist consulted for admission ? ? ?EMR reviewed  ?Reviewed recent admission notes, including discharge summary from earlier today. ? ? ? ? ?FINAL CLINICAL IMPRESSION(S) / ED DIAGNOSES  ? ?Final diagnoses:  ?Acute on chronic respiratory failure with hypoxemia ([place])  ?Pleural effusion  ?Acute on chronic systolic congestive heart failure  ([place])  ? ? ? ?Rx / DC Orders  ? ?ED Discharge Orders   ? ? None  ? ?  ? ? ? ?Note:  This document was prepared using Dragon voice recognition software and may include unintentional dictation errors. ?  ?Duffy Bruce, MD ?04/21/2022 2312 ? ?

## 2022-04-16 NOTE — Telephone Encounter (Signed)
noted 

## 2022-04-16 NOTE — Hospital Course (Addendum)
[name] is a 88 year old female with neuropathy, hypothyroid, hypertension, pulmonary hypertension, hyperlipidemia, COPD on 4 L nasal cannula, GERD, who presents emergency department from [place] facility for chief concerns of shortness of breath. ?She had a significant hypoxemia with oxygen saturation at 77%.  She was placed on 100% oxygen with nonrebreather.  She also had significant elevation of BNP, started IV Lasix for acute on chronic diastolic congestive heart failure. ? ? ?

## 2022-04-16 NOTE — Progress Notes (Signed)
Physical Therapy Treatment ?Patient Details ?Name: Jennifer Skinner ?MRN: [id number] ?DOB: Oct 21, 1931 ?Today's Date: 04/15/2022 ? ? ?History of Present Illness Pt is an 88 y/o F admitted on 04/13/22 after presenting with acute onset of LLE swelling & pain & has been unable to ambulate since. Pt found to have L fibular fx. PMH: COPD, GERD, dyslipidemia, mitral valve prolapse & regurgitation, osteoporosis, lumbar DDD ? ?  ?PT Comments  ? ? Pt resting in bed upon PT arrival; pt's son present; pt agreeable to PT session with a little encouragement.  During session pt max assist with bed mobility and SBA with sitting balance.  Difficulty obtaining good O2 readings sitting edge of bed (varying between 70-85 % on 4 L via nasal cannula but did not have optimal waveform for O2 reading) so pt assisted back to bed (nurse notified regarding O2 readings and nursing came to assist pt with clean-up d/t pt requested to urinate in her brief).  Will continue to focus on strengthening and progressive functional mobility per pt tolerance. ?   ?Recommendations for follow up therapy are one component of a multi-disciplinary discharge planning process, led by the attending physician.  Recommendations may be updated based on patient status, additional functional criteria and insurance authorization. ? ?Follow Up Recommendations ? Skilled nursing-short term rehab (<3 hours/day) ?  ?  ?Assistance Recommended at Discharge Frequent or constant Supervision/Assistance  ?Patient can return home with the following A lot of help with walking and/or transfers;A lot of help with bathing/dressing/bathroom;Assist for transportation;Assistance with cooking/housework;Help with stairs or ramp for entrance;Direct supervision/assist for medications management ?  ?Equipment Recommendations ? None recommended by PT  ?  ?Recommendations for Other Services   ? ? ?  ?Precautions / Restrictions Precautions ?Precautions: Fall ?Restrictions ?Weight Bearing Restrictions:  Yes ?LLE Weight Bearing: Weight bearing as tolerated ?Other Position/Activity Restrictions: Per ortho note:  "Patient can be weightbearing as tolerated with no restrictions on knee range of motion"  ?  ? ?Mobility ? Bed Mobility ?Overal bed mobility: Needs Assistance ?Bed Mobility: Supine to Sit, Sit to Supine ?  ?  ?Supine to sit: Max assist, HOB elevated ?Sit to supine: Max assist, HOB elevated (2 assist to boost pt up in bed using bed sheet) ?  ?General bed mobility comments: vc's for technique ?  ? ?Transfers ?  ?  ?  ?  ?  ?  ?  ?  ?  ?  ?  ? ?Ambulation/Gait ?  ?  ?  ?  ?  ?  ?  ?  ? ? ?Stairs ?  ?  ?  ?  ?  ? ? ?Wheelchair Mobility ?  ? ?Modified Rankin (Stroke Patients Only) ?  ? ? ?  ?Balance Overall balance assessment: Needs assistance ?Sitting-balance support: Feet supported, Bilateral upper extremity supported ?Sitting balance-Leahy Scale: Fair ?Sitting balance - Comments: steady static sitting ?  ?  ?  ?  ?  ?  ?  ?  ?  ?  ?  ?  ?  ?  ?  ?  ? ?  ?Cognition Arousal/Alertness: Awake/alert ?Behavior During Therapy: Anxious ?Overall Cognitive Status: History of cognitive impairments - at baseline ?Area of Impairment: Attention, Memory, Following commands, Safety/judgement, Awareness, Problem solving ?  ?  ?  ?  ?  ?  ?  ?  ?  ?Current Attention Level: Sustained ?Memory: Decreased short-term memory ?Following Commands: Follows one step commands with increased time ?Safety/Judgement: Decreased awareness of safety, Decreased awareness of  deficits ?Awareness: Emergent ?Problem Solving: Requires verbal cues, Requires tactile cues ?  ?  ?  ? ?  ?Exercises   ? ?  ?General Comments  Nursing cleared pt for participation in physical therapy.  Pt agreeable to PT session. ?  ?  ? ?Pertinent Vitals/Pain Pain Assessment ?Pain Assessment: Faces ?Faces Pain Scale: Hurts a little bit ?Pain Location: back ?Pain Descriptors / Indicators: Sore ?Pain Intervention(s): Limited activity within patient's tolerance, Monitored during  session, Repositioned  ? ? ?Home Living   ?  ?  ?  ?  ?  ?  ?  ?  ?  ?   ?  ?Prior Function    ?  ?  ?   ? ?PT Goals (current goals can now be found in the care plan section) Acute Rehab PT Goals ?Patient Stated Goal: decreased pain ?PT Goal Formulation: With patient ?Time For Goal Achievement: 04/28/22 ?Potential to Achieve Goals: Fair ?Progress towards PT goals: Progressing toward goals ? ?  ?Frequency ? ? ? Min 2X/week ? ? ? ?  ?PT Plan Current plan remains appropriate  ? ? ?Co-evaluation   ?  ?  ?  ?  ? ?  ?AM-PAC PT "6 Clicks" Mobility   ?Outcome Measure ? Help needed turning from your back to your side while in a flat bed without using bedrails?: A Lot ?Help needed moving from lying on your back to sitting on the side of a flat bed without using bedrails?: A Lot ?Help needed moving to and from a bed to a chair (including a wheelchair)?: A Lot ?Help needed standing up from a chair using your arms (e.g., wheelchair or bedside chair)?: A Lot ?Help needed to walk in hospital room?: Total ?Help needed climbing 3-5 steps with a railing? : Total ?6 Click Score: 10 ? ?  ?End of Session Equipment Utilized During Treatment: Oxygen (4 L via nasal cannula) ?Activity Tolerance: Patient tolerated treatment well ?Patient left: in bed;with call bell/phone within reach;with bed alarm set;with family/visitor present;Other (comment) (B heels floating via pillow support) ?Nurse Communication: Mobility status;Precautions;Other (comment) (pt's O2 sats) ?PT Visit Diagnosis: Unsteadiness on feet (R26.81);Muscle weakness (generalized) (M62.81);Difficulty in walking, not elsewhere classified (R26.2);Pain ?Pain - Right/Left: Left ?Pain - part of body: Leg ?  ? ? ?Time: 1694-5038 ?PT Time Calculation (min) (ACUTE ONLY): 36 min ? ?Charges:  $Therapeutic Activity: 23-37 mins          ?          ? ?Leitha Bleak, PT ?05/06/2022, 11:54 AM ? ? ?

## 2022-04-16 NOTE — Care Management Important Message (Signed)
Important Message ? ?Patient Details  ?Name: Jennifer Skinner ?MRN: [id number] ?Date of Birth: 1931/06/29 ? ? ?Medicare Important Message Given:  N/A - LOS <3 / Initial given by admissions ? ? ? ? ?[name] ?05/09/2022, 10:41 AM ?

## 2022-04-17 ENCOUNTER — Observation Stay: Payer: PPO

## 2022-04-17 ENCOUNTER — Other Ambulatory Visit: Payer: Self-pay

## 2022-04-17 ENCOUNTER — Inpatient Hospital Stay: Payer: PPO

## 2022-04-17 DIAGNOSIS — Z880 Allergy status to penicillin: Secondary | ICD-10-CM | POA: Diagnosis not present

## 2022-04-17 DIAGNOSIS — R188 Other ascites: Secondary | ICD-10-CM | POA: Diagnosis present

## 2022-04-17 DIAGNOSIS — R64 Cachexia: Secondary | ICD-10-CM | POA: Diagnosis present

## 2022-04-17 DIAGNOSIS — C642 Malignant neoplasm of left kidney, except renal pelvis: Secondary | ICD-10-CM | POA: Diagnosis present

## 2022-04-17 DIAGNOSIS — Z515 Encounter for palliative care: Secondary | ICD-10-CM | POA: Diagnosis not present

## 2022-04-17 DIAGNOSIS — E876 Hypokalemia: Secondary | ICD-10-CM | POA: Diagnosis present

## 2022-04-17 DIAGNOSIS — E785 Hyperlipidemia, unspecified: Secondary | ICD-10-CM | POA: Diagnosis present

## 2022-04-17 DIAGNOSIS — I5033 Acute on chronic diastolic (congestive) heart failure: Secondary | ICD-10-CM

## 2022-04-17 DIAGNOSIS — Z20822 Contact with and (suspected) exposure to covid-19: Secondary | ICD-10-CM | POA: Diagnosis present

## 2022-04-17 DIAGNOSIS — R0602 Shortness of breath: Secondary | ICD-10-CM | POA: Diagnosis present

## 2022-04-17 DIAGNOSIS — I509 Heart failure, unspecified: Secondary | ICD-10-CM | POA: Diagnosis not present

## 2022-04-17 DIAGNOSIS — J9621 Acute and chronic respiratory failure with hypoxia: Secondary | ICD-10-CM

## 2022-04-17 DIAGNOSIS — J690 Pneumonitis due to inhalation of food and vomit: Secondary | ICD-10-CM | POA: Diagnosis present

## 2022-04-17 DIAGNOSIS — E43 Unspecified severe protein-calorie malnutrition: Secondary | ICD-10-CM | POA: Diagnosis present

## 2022-04-17 DIAGNOSIS — J918 Pleural effusion in other conditions classified elsewhere: Secondary | ICD-10-CM | POA: Diagnosis present

## 2022-04-17 DIAGNOSIS — I110 Hypertensive heart disease with heart failure: Secondary | ICD-10-CM | POA: Diagnosis present

## 2022-04-17 DIAGNOSIS — G629 Polyneuropathy, unspecified: Secondary | ICD-10-CM | POA: Diagnosis present

## 2022-04-17 DIAGNOSIS — K219 Gastro-esophageal reflux disease without esophagitis: Secondary | ICD-10-CM | POA: Diagnosis present

## 2022-04-17 DIAGNOSIS — Z681 Body mass index (BMI) 19 or less, adult: Secondary | ICD-10-CM | POA: Diagnosis not present

## 2022-04-17 DIAGNOSIS — J449 Chronic obstructive pulmonary disease, unspecified: Secondary | ICD-10-CM | POA: Diagnosis present

## 2022-04-17 DIAGNOSIS — D539 Nutritional anemia, unspecified: Secondary | ICD-10-CM | POA: Diagnosis present

## 2022-04-17 DIAGNOSIS — J90 Pleural effusion, not elsewhere classified: Secondary | ICD-10-CM | POA: Diagnosis not present

## 2022-04-17 DIAGNOSIS — Z7189 Other specified counseling: Secondary | ICD-10-CM | POA: Diagnosis not present

## 2022-04-17 DIAGNOSIS — I2721 Secondary pulmonary arterial hypertension: Secondary | ICD-10-CM | POA: Diagnosis present

## 2022-04-17 DIAGNOSIS — Z66 Do not resuscitate: Secondary | ICD-10-CM | POA: Diagnosis present

## 2022-04-17 DIAGNOSIS — Z8249 Family history of ischemic heart disease and other diseases of the circulatory system: Secondary | ICD-10-CM | POA: Diagnosis not present

## 2022-04-17 DIAGNOSIS — I959 Hypotension, unspecified: Secondary | ICD-10-CM | POA: Diagnosis present

## 2022-04-17 DIAGNOSIS — E039 Hypothyroidism, unspecified: Secondary | ICD-10-CM | POA: Diagnosis present

## 2022-04-17 DIAGNOSIS — I340 Nonrheumatic mitral (valve) insufficiency: Secondary | ICD-10-CM | POA: Diagnosis present

## 2022-04-17 DIAGNOSIS — E875 Hyperkalemia: Secondary | ICD-10-CM | POA: Diagnosis not present

## 2022-04-17 DIAGNOSIS — L89152 Pressure ulcer of sacral region, stage 2: Secondary | ICD-10-CM | POA: Diagnosis present

## 2022-04-17 LAB — CBC
HCT: 37.4 % (ref 36.0–46.0)
Hemoglobin: 11.5 g/dL — ABNORMAL LOW (ref 12.0–15.0)
MCH: 30.7 pg (ref 26.0–34.0)
MCHC: 30.7 g/dL (ref 30.0–36.0)
MCV: 99.7 fL (ref 80.0–100.0)
Platelets: 223 10*3/uL (ref 150–400)
RBC: 3.75 MIL/uL — ABNORMAL LOW (ref 3.87–5.11)
RDW: 16.8 % — ABNORMAL HIGH (ref 11.5–15.5)
WBC: 6.1 10*3/uL (ref 4.0–10.5)
nRBC: 0 % (ref 0.0–0.2)

## 2022-04-17 LAB — LACTATE DEHYDROGENASE, PLEURAL OR PERITONEAL FLUID: LD, Fluid: 72 U/L — ABNORMAL HIGH (ref 3–23)

## 2022-04-17 LAB — BODY FLUID CELL COUNT WITH DIFFERENTIAL
Eos, Fluid: 0 %
Lymphs, Fluid: 55 %
Monocyte-Macrophage-Serous Fluid: 21 % — ABNORMAL LOW (ref 50–90)
Neutrophil Count, Fluid: 24 % (ref 0–25)
Total Nucleated Cell Count, Fluid: 59 cu mm (ref 0–1000)

## 2022-04-17 LAB — BASIC METABOLIC PANEL
Anion gap: 6 (ref 5–15)
BUN: 23 mg/dL (ref 8–23)
CO2: 35 mmol/L — ABNORMAL HIGH (ref 22–32)
Calcium: 8.3 mg/dL — ABNORMAL LOW (ref 8.9–10.3)
Chloride: 99 mmol/L (ref 98–111)
Creatinine, Ser: 0.63 mg/dL (ref 0.44–1.00)
GFR, Estimated: >60 mL/min (ref >60)
Glucose, Bld: 104 mg/dL — ABNORMAL HIGH (ref 70–99)
Potassium: 3.7 mmol/L (ref 3.5–5.1)
Sodium: 140 mmol/L (ref 135–145)

## 2022-04-17 LAB — LACTIC ACID, PLASMA: Lactic Acid, Venous: 0.8 mmol/L (ref 0.5–1.9)

## 2022-04-17 MED ORDER — ENSURE ENLIVE PO LIQD
237.0000 mL | Freq: Two times a day (BID) | ORAL | Status: DC
  Administered 2022-04-20 (×2): 237 mL via ORAL

## 2022-04-17 MED ORDER — ALUM & MAG HYDROXIDE-SIMETH 200-200-20 MG/5ML PO SUSP
30.0000 mL | ORAL | Status: DC | PRN
  Administered 2022-04-17 – 2022-04-18 (×2): 30 mL via ORAL
  Filled 2022-04-17 (×2): qty 30

## 2022-04-17 MED ORDER — LEVOFLOXACIN 500 MG PO TABS
250.0000 mg | ORAL_TABLET | Freq: Every day | ORAL | Status: DC
  Administered 2022-04-17 – 2022-04-18 (×2): 250 mg via ORAL
  Filled 2022-04-17 (×2): qty 1

## 2022-04-17 MED ORDER — FUROSEMIDE 10 MG/ML IJ SOLN
40.0000 mg | Freq: Two times a day (BID) | INTRAMUSCULAR | Status: DC
  Administered 2022-04-17: 40 mg via INTRAVENOUS
  Filled 2022-04-17: qty 4

## 2022-04-17 MED ORDER — FUROSEMIDE 10 MG/ML IJ SOLN
20.0000 mg | Freq: Two times a day (BID) | INTRAMUSCULAR | Status: DC
  Administered 2022-04-17 – 2022-04-20 (×5): 20 mg via INTRAVENOUS
  Filled 2022-04-17 (×6): qty 2

## 2022-04-17 NOTE — Progress Notes (Signed)
Patient discharged 5/8, Nurse Michail Jewels LPN removed tramadol from pyxis but never gave medicine. Patient already discharged home. I added temporary patient to pyxis, and helped return the (2) '50mg'$  tramadol tabs to pyxis inventory, witnessed by Michail Jewels LPN. ?

## 2022-04-17 NOTE — ED Notes (Signed)
Patient brief and bedding change due to be wet. Purwick put in place. Patient incontinent of stool and urine. Two warm blankets provided. Patient comfortable in bed. ?

## 2022-04-17 NOTE — Assessment & Plan Note (Signed)
-   Dietary has been consulted 

## 2022-04-17 NOTE — Evaluation (Signed)
Clinical/Bedside Swallow Evaluation ?Patient Details  ?Name: Jennifer Skinner ?MRN: [id number] ?Date of Birth: 10/04/1931 ? ?Today's Date: 04/17/2022 ?Time: SLP Start Time (ACUTE ONLY): 1430 SLP Stop Time (ACUTE ONLY): 1941 ?SLP Time Calculation (min) (ACUTE ONLY): 60 min ? ?Past Medical History:  ?Past Medical History:  ?Diagnosis Date  ? 1st degree AV block   ? Arthritis   ? Chronic diastolic CHF (congestive heart failure) ([place])   ? a. echo 2014: EF 55-60%, nl wall motion, GR1DD, mild MR, moder mitral prolapse, PASP 40 mm Hg; b. echo 09/2015: EF 60-65%, nl wall motion, GR1DD, mod MR, mild to mod TR, PASP 56 mm Hg  ? COPD (chronic obstructive pulmonary disease) ([place])   ? Degenerative disc disease, lumbar   ? Gastro-esophageal reflux   ? Hyperlipidemia   ? Mitral prolapse   ? Mitral regurgitation   ? Osteoporosis   ? ?Past Surgical History:  ?Past Surgical History:  ?Procedure Laterality Date  ? ABDOMINAL HYSTERECTOMY    ? APPENDECTOMY    ? DILATION AND CURETTAGE OF UTERUS    ? HYSTEROTOMY    ? right breast biopsy    ? right eye     ? surgery  ? TONSILLECTOMY    ? ?HPI:  ?Pt is a 88 y.o. female with past medical history of CHF, COPD, GERD, mitral valve prolapse with regurgitation, lumbar degenerative disc disease, here with shortness of breath.  Patient was actually just admitted for left fibula nondisplaced fracture and sent to facility just several hours ago.  She reportedly was hypoxic after arrival at facility per review of records, and had a difficulty assessing pulse ox.  She reportedly was found severely hypoxic and dyspneic by the facility, and was sent to the ED.  She currently is actually without complaints.  She denies any shortness of breath.  Denies any significant pain other than mild pain of her left leg -- previous fall w/ leg edema and drainage leading to admit on 04/13/2022. She has not been able to ambulate secondarily. She lives alone. She is on home O2 at 3 L/min. OF NOTE: Pt/Son requested a Hospice  Palliative order prior to last admit.  ?This admit: Thoracentesis performed 04/17/2022 and f/u CXRs revealed: "No definite or increasing pneumothorax on the right. Unchanged  reticular pulmonary opacities with small left pleural effusions atelectasis.  Cardiopericardial enlargement.".  ? ?  ?Assessment / Plan / Recommendation  ?Clinical Impression ? Pt seen today for BSE. She was alert, verbally responsive to general questions/tasks; followed instruction given cues. She was distracted at times but able to follow through w/ general tasks and answering questions given time to redirect -- more basic vs complex questions/tasks w/ verbal cues, and Time given. Unsure of pt's baseline Cognitive status. Pt is afebrile; wbc wnl; on 6L O2 support(3L at home).     ?Pt required positioning and was adequately positioned w/ head forward/upright for oral intake/tasks.  ? ?She appears to present w/ adequate oropharyngeal phase swallow function w/ No overt oropharyngeal phase dysphagia noted, No neuromuscular deficits noted. Mild oral phase changes c/b lengthier Time needed for mastication and oral clearing of increased textured foods/trials. Pt is missing few Dentition also. Pt consumed po trials w/ No overt, clinical s/s of aspiration during po trials.   ?Pt appears at reduced risk for aspiration following general aspiration precautions, using a modified diet for easy to chew foods, and when given some Supervision/Setup during meals along w/ reducing distractions.         ? ?  During po trials, pt consumed all consistencies w/ no overt coughing, decline in vocal quality, or change in respiratory presentation during/post trials; Thin liquids via cup and straw. O2 sats remained in low 90s as at her baseline per monitor. Oral phase appeared grossly [facility] w/ timely bolus management and control of bolus propulsion for A-P transfer for swallowing w/ trials of liquids and purees. Min lengthier mastication Time noted w/ increased textured foods  -- unsure if any impact from Cognition vs missing Dentition, or both. Moistening of solids provided more effective and timely mastication. Given Time and alternating foods/liquids, oral clearing achieved w/ all trial consistencies. OM Exam appeared [facility] w/ no unilateral weakness noted. Speech intelligible. Pt helped to feed self w/ setup support.          ? ?Recommend a more Mech soft food diet consistency w/ Minced/Chopped meats; moistened foods for ease of mastication. Thin liquids. Recommend general aspiration precautions, Pills WHOLE in Puree for safer, easier swallowing as needed. Supervision/Support at meals for setup and guidance - reduce distractions at meals to allow pt to focus on eating/drinking safely. Education given on Pills in Puree; food consistencies and easy to eat options; general aspiration precautions w/ pt/Son. Precautions posted. MD updated.   ?Recommend f/u w/ Palliative Care for [place] in setting of chronic conditions and as per pt/family request surrounding last admit.  ?SLP Visit Diagnosis: Dysphagia, oral phase (R13.11) (MILD+) ?   ?Aspiration Risk ? Mild aspiration risk;Risk for inadequate nutrition/hydration (reduced following general aspiration precautions)  ?  ?Diet Recommendation   Mech soft food diet consistency w/ Minced/Chopped meats; moistened foods for ease of mastication. Thin liquids. Recommend general aspiration precautions. Supervision/Support at meals for setup and guidance - reduce distractions at meals to allow pt to focus on eating/drinking safely.  ? ?Medication Administration: Whole meds with puree (if any difficulty when swallowing w/ water)  ?  ?Other  Recommendations Recommended Consults:  (Dietician f/u; palliative care f/u) ?Oral Care Recommendations: Oral care BID;Oral care before and after PO;Staff/trained caregiver to provide oral care ?Other Recommendations:  (n/a)   ? ?Recommendations for follow up therapy are one component of a multi-disciplinary discharge  planning process, led by the attending physician.  Recommendations may be updated based on patient status, additional functional criteria and insurance authorization. ? ?Follow up Recommendations No SLP follow up  ? ? ?  ?Assistance Recommended at Discharge Intermittent Supervision/Assistance (unsure of baseline Cognitive status)  ?Functional Status Assessment Patient has not had a recent decline in their functional status (re: swallowing)  ?Frequency and Duration  (n/a)  ? (n/a) ?  ?   ? ?Prognosis Prognosis for Safe Diet Advancement: Fair ?Barriers to Reach Goals: Time post onset;Severity of deficits ?Barriers/Prognosis Comment: deconditioned status baseline  ? ?  ? ?Swallow Study   ?General Date of Onset: 05/03/2022 ?HPI: Pt is a 88 y.o. female with past medical history of CHF, COPD, GERD, mitral valve prolapse with regurgitation, lumbar degenerative disc disease, here with shortness of breath.  Patient was actually just admitted for left fibula nondisplaced fracture and sent to facility just several hours ago.  She reportedly was hypoxic after arrival at facility per review of records, and had a difficulty assessing pulse ox.  She reportedly was found severely hypoxic and dyspneic by the facility, and was sent to the ED.  She currently is actually without complaints.  She denies any shortness of breath.  Denies any significant pain other than mild pain of her left leg -- previous  fall w/ leg edema and drainage leading to admit on 04/13/2022. She has not been able to ambulate secondarily. She lives alone. She is on home O2 at 3 L/min. OF NOTE: Pt/Son requested a Hospice Palliative order prior to last admit. This admit: Thoracentesis performed 04/17/2022 and f/u CXRs revealed: "No definite or increasing pneumothorax on the right. Unchanged  reticular pulmonary opacities with small left pleural effusions atelectasis.  Cardiopericardial enlargement.". ?Type of Study: Bedside Swallow Evaluation ?Previous Swallow  Assessment: none ?Diet Prior to this Study: Regular;Thin liquids ?Temperature Spikes Noted: No (wbc 6.1) ?Respiratory Status: Nasal cannula (6L) ?History of Recent Intubation: No ?Behavior/Cognition: Alert;Cooperati

## 2022-04-17 NOTE — Assessment & Plan Note (Signed)
-   I have resumed sildenafil 20 mg p.o. 3 times daily ?

## 2022-04-17 NOTE — Assessment & Plan Note (Signed)
Continue Crestor and Zetia 

## 2022-04-17 NOTE — Procedures (Signed)
PROCEDURE SUMMARY: ? ?Successful US guided right thoracentesis. ?Yielded 500 mL of clear yellow fluid. ?Pt tolerated procedure well. ?No immediate complications. ? ?Specimen was sent for labs. ?CXR ordered. ? ?EBL < 5 mL ? ?Tsosie Billing D PA-C ?04/17/2022 ?9:30 AM ? ? ? ?

## 2022-04-17 NOTE — Assessment & Plan Note (Signed)
-   Levothyroxine 25 mcg daily before breakfast resumed 

## 2022-04-17 NOTE — Assessment & Plan Note (Signed)
-   Right greater than the left ?- Ultrasound-guided thoracentesis ordered for the right pleural effusion placed with labs ?

## 2022-04-17 NOTE — Assessment & Plan Note (Signed)
-   Presumed secondary to moderate pleural effusion right greater than left ?- Ultrasound-guided thoracentesis has been ordered ?

## 2022-04-17 NOTE — Progress Notes (Signed)
?  Progress Note ? ? ?Patient: Jennifer Skinner NOB:[id number] DOB: 01/06/1931 DOA: 04/15/2022     0 ?DOS: the patient was seen and examined on 04/17/2022 ?  ?Brief hospital course: ?[name] is a 88 year old female with neuropathy, hypothyroid, hypertension, pulmonary hypertension, hyperlipidemia, COPD on 4 L nasal cannula, GERD, who presents emergency department from [place] facility for chief concerns of shortness of breath. ?She had a significant hypoxemia with oxygen saturation at 77%.  She was placed on 100% oxygen with nonrebreather.  She also had significant elevation of BNP, started IV Lasix for acute on chronic diastolic congestive heart failure. ? ? ? ?Assessment and Plan: ?Acute on chronic hypoxemic respite failure. ?Acute on chronic diastolic congestive heart failure. ?Bilateral pleural effusion, more on the right side ?Possible left lower lobe aspiration pneumonia ?Patient was on 3 L oxygen at baseline.  She has worsening hypoxemia and a respite distress at the time of the admission.  This appears to be secondary to acute on chronic diastolic congestive heart failure.  Patient has evidence of volume overload, increased short of breath, crackles in the base, chest x-ray showed bilateral pleural effusion, left basilar airspace disease. ?Patient definitely has acute on chronic diastolic congestive heart failure.  We will continue IV Lasix for now. ?Patient has a procalcitonin level of 0.19 in combination with left base airspace disease, patient also has dysphagia.  Possibility of aspiration.  Will treat with oral Levaquin for possible aspiration pneumonia, patient has penicillin allergy.  Obtain speech therapy evaluation. ?Patient is status post right sided thoracentesis with removal of 500 mL of fluid. ? ?Pulmonary hypertension. ?Continue sildenafil. ? ?Essential hypertension. ?On metoprolol ? ?Severe protein calorie malnutrition ?Continue protein supplements ? ?Discharge planning. ?Patient  just recently admitted to SNF for 1 day after stay in the hospital.  Received a phone call from SNF, insurance would not approve her back to SNF.  Recommended palliative care consult due to poor prognosis. ? ?  ? ?Subjective:  ?Patient still has significant hypoxia, short of breath with exertion.  Poor appetite.  Short of breath seem to be better since yesterday. ? ?Physical Exam: ?Vitals:  ? 04/17/22 0800 04/17/22 0822 04/17/22 1100 04/17/22 1155  ?BP: (!) 108/58 130/65 (!) 105/54 127/68  ?Pulse: 66  91 95  ?Resp: 17  (!) 25 (!) 25  ?Temp: 98 ?F (36.7 ?C)  97.9 ?F (36.6 ?C) 98 ?F (36.7 ?C)  ?TempSrc: Oral  Oral Axillary  ?SpO2: 92%  92% 98%  ? ?General exam: Appears calm and comfortable, cachectic, severely malnourished. ?Respiratory system: Increased breathing sounds bilaterally, with crackles in the base. Respiratory effort normal. ?Cardiovascular system: S1 & S2 heard, RRR. No JVD, murmurs, rubs, gallops or clicks. No pedal edema. ?Gastrointestinal system: Abdomen is nondistended, soft and nontender. No organomegaly or masses felt. Normal bowel sounds heard. ?Central nervous system: Alert and oriented. No focal neurological deficits. ?Extremities: Severe muscle atrophy. ?Skin: No rashes, lesions or ulcers ? ? ?Data Reviewed: ? ?Reviewed chest x-ray, CT scan as well as lab results. ? ?Family Communication: Son updated at bedside. ? ?Disposition: ?Status is: Inpatient ?Remains inpatient appropriate because: Severity of disease, IV treatment. ? Planned Discharge Destination:  Pending ? ? ? ?Time spent: 55 minutes ? ?Author: ?Sharen Hones, MD ?04/17/2022 12:36 PM ? ?For on call review [email or web address].  ?

## 2022-04-17 NOTE — Assessment & Plan Note (Signed)
-   Metoprolol tartrate 12.5 mg p.o. twice daily resumed ?

## 2022-04-17 NOTE — ED Notes (Signed)
Pt refused to eat breakfast , she did take a few of her morning medications  ?

## 2022-04-17 NOTE — Assessment & Plan Note (Signed)
-   Resumed home gabapentin 100 mg p.o. 3 times daily °

## 2022-04-18 DIAGNOSIS — Z515 Encounter for palliative care: Secondary | ICD-10-CM

## 2022-04-18 DIAGNOSIS — Z66 Do not resuscitate: Secondary | ICD-10-CM

## 2022-04-18 DIAGNOSIS — Z7189 Other specified counseling: Secondary | ICD-10-CM

## 2022-04-18 LAB — CYTOLOGY - NON PAP

## 2022-04-18 LAB — BASIC METABOLIC PANEL
Anion gap: 5 (ref 5–15)
BUN: 19 mg/dL (ref 8–23)
CO2: 36 mmol/L — ABNORMAL HIGH (ref 22–32)
Calcium: 8.4 mg/dL — ABNORMAL LOW (ref 8.9–10.3)
Chloride: 99 mmol/L (ref 98–111)
Creatinine, Ser: 0.51 mg/dL (ref 0.44–1.00)
GFR, Estimated: >60 mL/min (ref >60)
Glucose, Bld: 97 mg/dL (ref 70–99)
Potassium: 3.4 mmol/L — ABNORMAL LOW (ref 3.5–5.1)
Sodium: 140 mmol/L (ref 135–145)

## 2022-04-18 LAB — MAGNESIUM: Magnesium: 2.4 mg/dL (ref 1.7–2.4)

## 2022-04-18 MED ORDER — ENOXAPARIN SODIUM 30 MG/0.3ML IJ SOSY
30.0000 mg | PREFILLED_SYRINGE | INTRAMUSCULAR | Status: DC
  Administered 2022-04-18 – 2022-04-19 (×2): 30 mg via SUBCUTANEOUS
  Filled 2022-04-18 (×2): qty 0.3

## 2022-04-18 MED ORDER — POTASSIUM CHLORIDE CRYS ER 20 MEQ PO TBCR
40.0000 meq | EXTENDED_RELEASE_TABLET | Freq: Once | ORAL | Status: AC
  Administered 2022-04-18: 40 meq via ORAL
  Filled 2022-04-18: qty 2

## 2022-04-18 NOTE — Progress Notes (Signed)
Mobility Specialist - Progress Note ? ? 04/18/22 1600  ?Mobility  ?Activity Transferred from chair to bed  ?Level of Assistance Minimal assist, patient does 75% or more  ?Assistive Device Front wheel walker  ?Distance Ambulated (ft) 2 ft  ?Activity Response Tolerated well  ?$Mobility charge 1 Mobility  ? ? ? ?Pt transferred chair-bed with [place]. Alarm set, needs in reach, son at bedside.  ? ? ?Kathee Delton ?Mobility Specialist ?04/18/22, 4:52 PM ? ? ? ? ?

## 2022-04-18 NOTE — Progress Notes (Signed)
Initial Nutrition Assessment ? ?DOCUMENTATION CODES:  ? ?Non-severe (moderate) malnutrition in context of chronic illness ? ?INTERVENTION:  ?- Encourage PO intake ?- Ice cream with all meals ?- Double Protein for each meal ?- MVI with minerals daily ?- Placed food preferences in Health Touch ? ?NUTRITION DIAGNOSIS:  ? ?Moderate Malnutrition related to chronic illness (COPD, CHF) as evidenced by mild fat depletion, severe muscle depletion, moderate muscle depletion. ? ?GOAL:  ? ?Patient will meet greater than or equal to 90% of their needs ? ?MONITOR:  ? ?PO intake, Supplement acceptance, Labs, Weight trends ? ?REASON FOR ASSESSMENT:  ? ?Consult ?Assessment of nutrition requirement/status ? ?ASSESSMENT:  ? ?Pt admitted from [place] with SOB secondary to pleural effusion d/t CHF. PMH significant for neuropathy, HTN, pulmonary HTN, HLD, COPD on 4L [place] and GERD. ? ?05/09: s/p R thoracentesis- yield 553m ; BSE by SLP- recommend dysphagia 3/thin liquids with supervision and support at all meals ? ?Pt with son at bedside. She reports eating very little but this is consistent with her baseline. We discussed her meal preferences to ensure she is receiving foods that she enjoys and will help with her PO intake and placed these preferences in the ordering system. Observed Ensure on her side table. She states that she does not like these as they have too much sugar which gives her a headache. Provided Boost Breeze for pt to try which she states is too sweet and she did not want to drink these. She was requesting ice cream for lunch and placed order to be sent on all meal trays.  ? ?Pt endorses a usual weight of ~ 85 lbs but suspects she has had recent weight loss. Reviewed weight history. Weight noted to have been trending upward within the last year. Weight appears to be stable within the last 6 months. Will continue to monitor throughout admission.  ? ?Medications: lasix, synthroid ? ?Labs: potassium 3.4  (replacing) ? ?NUTRITION - FOCUSED PHYSICAL EXAM: ? ?Flowsheet Row Most Recent Value  ?Orbital Region Moderate depletion  ?Upper Arm Region Severe depletion  ?Thoracic and Lumbar Region Mild depletion  ?Buccal Region Mild depletion  ?Temple Region Moderate depletion  ?Clavicle Bone Region Moderate depletion  ?Clavicle and Acromion Bone Region Moderate depletion  ?Scapular Bone Region Moderate depletion  ?Dorsal Hand Mild depletion  ?Patellar Region Severe depletion  ?Anterior Thigh Region Severe depletion  ?Posterior Calf Region Severe depletion  ?Edema (RD Assessment) Mild  ?Hair Reviewed  ?Eyes Reviewed  ?Mouth Reviewed  ?Skin Reviewed  ?Nails Reviewed  ? ?  ? ? ?Diet Order:   ?Diet Order   ? ?       ?  DIET DYS 3 Room service appropriate? Yes with Assist; Fluid consistency: Thin  Diet effective now       ?  ? ?  ?  ? ?  ? ? ?EDUCATION NEEDS:  ? ?Education needs have been addressed ? ?Skin:  Skin Assessment: Skin Integrity Issues: ?Skin Integrity Issues:: Stage II ?Stage II: sacrum ? ?Last BM:  5/9 (type 6-small) ? ?Height:  ? ?Ht Readings from Last 1 Encounters:  ?04/13/22 '4\' 7"'$  (1.397 m)  ? ? ?Weight:  ? ?Wt Readings from Last 1 Encounters:  ?04/23/2022 37 kg  ? ?BMI:  There is no height or weight on file to calculate BMI. ? ?Estimated Nutritional Needs:  ? ?Kcal:  1000-1200 ? ?Protein:  50-65g ? ?Fluid:  >/=1L ? ?AClayborne Dana RDN, LDN ?Clinical Nutrition ?

## 2022-04-18 NOTE — Consult Note (Signed)
Consultation Note Date: 04/18/2022   Patient Name: Jennifer Skinner  DOB: 20-Sep-1931  MRN: [id number]  Age / Sex: 88 y.o., female  PCP: Olin Hauser, DO Referring Physician: Nolberto Hanlon, MD  Reason for Consultation: Establishing goals of care  HPI/Patient Profile: 88 y.o. female  with past medical history of neuropathy, hypothyroid, hypertension, CHF, pulmonary hypertension, hyperlipidemia, COPD on 4 L nasal cannula, and GERD admitted on 05/05/2022 with shortness of breath.  Diagnosed with acute on chronic respiratory failure, acute on chronic CHF, bilateral pleural effusion, and possible left lower lobe aspiration pneumonia.  PMT consulted to discuss goals of care.  Clinical Assessment and Goals of Care: I have reviewed medical records including EPIC notes, labs and imaging, assessed the patient and then met with patient and son Elta Guadeloupe to discuss diagnosis prognosis, GOC, EOL wishes, disposition and options.  I introduced Palliative Medicine as specialized medical care for people living with serious illness. It focuses on providing relief from the symptoms and stress of a serious illness. The goal is to improve quality of life for both the patient and the family.  Patient is alert and oriented however she does drift off to sleep occasionally during conversation.  Wakes quickly to calling her name.  Tells me she is feeling better.  Son reports that prior to fibula fracture patient was living home alone and independently able to care for herself.  They both share patient's spouse passed away in September 18, 2021.    We discussed patient's current illness and what it means in the larger context of patient's on-going co-morbidities.  We discussed her heart failure and respiratory failure as well as her fibula fracture and decline in functional status.  Both expressed hope for improvement in functional status and rehab.  I attempted to elicit  values and goals of care important to the patient.    The difference between aggressive medical intervention and comfort care was considered in light of the patient's goals of care.   Advance directives, concepts specific to code status, artificial feeding and hydration, and rehospitalization were considered and discussed.   I completed a MOST form today. The patient and family outlined their wishes for the following treatment decisions:  Cardiopulmonary Resuscitation: Do Not Attempt Resuscitation (DNR/No CPR)  Medical Interventions: Limited Additional Interventions: Use medical treatment, IV fluids and cardiac monitoring as indicated, DO NOT USE intubation or mechanical ventilation. May consider use of less invasive airway support such as BiPAP or CPAP. Also provide comfort measures. Transfer to the hospital if indicated. Avoid intensive care.   Antibiotics: Antibiotics if indicated.  IV Fluids: IV fluids if indicated  Feeding Tube: No feeding tube   Discussed with patient and son the importance of continued conversation with family and the medical providers regarding overall plan of care and treatment options, ensuring decisions are within the context of the patients values and GOCs.    Son is mostly concerned about why they have been told patient is not approved to return to rehab.  He tells me he spoke with someone who works at the rehab facility who told him they were saving a bed for the patient.  I have passed this along to [facility] who will reach out to son.  We discussed rehab facility versus home health in the home.  Son is concerned they will not have enough support in the home but is going to potentially look into private caregivers.  He would like to see how patient works with therapy and then make  decision from there.  Questions and concerns were addressed. The family was encouraged to call with questions or concerns.  Primary Decision Maker PATIENT    SUMMARY OF RECOMMENDATIONS    Most completed as above -DNR, limited medical interventions, no feeding tube Patient and son hopeful for SNF versus home health -son would first like to see how patient works with rehab  Code Status/Advance Care Planning: DNR     Primary Diagnoses: Present on Admission:  Dyslipidemia  Essential hypertension  Acute on chronic respiratory failure with hypoxia (HCC)  Acute on chronic diastolic CHF (congestive heart failure) (HCC)  Hypothyroidism  GERD  Pulmonary HTN ([place])  Protein-calorie malnutrition, severe ([place])  Acute on chronic diastolic (congestive) heart failure ([place])   I have reviewed the medical record, interviewed the patient and family, and examined the patient. The following aspects are pertinent.  Past Medical History:  Diagnosis Date   1st degree AV block    Arthritis    Chronic diastolic CHF (congestive heart failure) ([place])    a. echo 2014: EF 55-60%, nl wall motion, GR1DD, mild MR, moder mitral prolapse, PASP 40 mm Hg; b. echo 09/2015: EF 60-65%, nl wall motion, GR1DD, mod MR, mild to mod TR, PASP 56 mm Hg   COPD (chronic obstructive pulmonary disease) (HCC)    Degenerative disc disease, lumbar    Gastro-esophageal reflux    Hyperlipidemia    Mitral prolapse    Mitral regurgitation    Osteoporosis    Social History   Socioeconomic History   Marital status: Married    Spouse name: Not on file   Number of children: Not on file   Years of education: Not on file   Highest education level: Not on file  Occupational History   Not on file  Tobacco Use   Smoking status: Never   Smokeless tobacco: Never  Vaping Use   Vaping Use: Never used  Substance and Sexual Activity   Alcohol use: No    Alcohol/week: 0.0 standard drinks   Drug use: No    Comment: prescribed tramadol   Sexual activity: Not Currently  Other Topics Concern   Not on file  Social History Narrative   Not on file   Social Determinants of Health   Financial Resource Strain: Low Risk     Difficulty of Paying Living Expenses: Not hard at all  Food Insecurity: No Food Insecurity   Worried About Charity fundraiser in the Last Year: Never true   Arboriculturist in the Last Year: Never true  Transportation Needs: No Transportation Needs   Lack of Transportation (Medical): No   Lack of Transportation (Non-Medical): No  Physical Activity: Inactive   Days of Exercise per Week: 0 days   Minutes of Exercise per Session: 0 min  Stress: No Stress Concern Present   Feeling of Stress : Not at all  Social Connections: Not on file   Family History  Problem Relation Age of Onset   CVA Mother 41   Heart attack Father 78   Throat cancer Sister    CAD Brother    Scheduled Meds:  aspirin EC  81 mg Oral Daily   feeding supplement  237 mL Oral BID BM   furosemide  20 mg Intravenous Q12H   gabapentin  100 mg Oral TID   levothyroxine  25 mcg Oral Q0600   metoprolol tartrate  12.5 mg Oral BID   mirabegron ER  25 mg Oral Daily   rosuvastatin  10 mg Oral QHS   sildenafil  20 mg Oral TID   Continuous Infusions: PRN Meds:.acetaminophen **OR** acetaminophen, alum & mag hydroxide-simeth, ondansetron **OR** ondansetron (ZOFRAN) IV, polyethylene glycol, traMADol Allergies  Allergen Reactions   Penicillins     Rash    Review of Systems  Constitutional:  Positive for activity change and fatigue.  Neurological:  Positive for weakness.   Physical Exam Constitutional:      General: She is not in acute distress.    Appearance: She is ill-appearing.     Comments: A bit drowsy, awakens easily to voice  Pulmonary:     Effort: Pulmonary effort is normal.  Skin:    General: Skin is warm and dry.  Neurological:     Mental Status: She is alert and oriented to person, place, and time.    Vital Signs: BP (!) 92/59 (BP Location: Left Arm)   Pulse 76   Temp 97.9 F (36.6 C)   Resp 18   SpO2 90%  Pain Scale: 0-10   Pain Score: 5    SpO2: SpO2: 90 % O2 Device:SpO2: 90 % O2 Flow  Rate: .O2 Flow Rate (L/min): 4 L/min  IO: Intake/output summary: No intake or output data in the 24 hours ending 04/18/22 1320  LBM: Last BM Date : 05/05/2022 Baseline Weight:   Most recent weight:       Palliative Assessment/Data: PPS 50%     *Please note that this is a verbal dictation therefore any spelling or grammatical errors are due to the "[employer]" system interpretation.   Juel Burrow, DNP, AGNP-C Palliative Medicine Team [phone] Pager: [phone]

## 2022-04-18 NOTE — Progress Notes (Signed)
?PROGRESS NOTE ? ? ? ?Jennifer Skinner  WGN:[id number] DOB: 1931/11/23 DOA: 04/18/2022 ?PCP: Olin Hauser, DO  ? ? ?Brief Narrative:  ?[name] is a 88 year old female with neuropathy, hypothyroid, hypertension, pulmonary hypertension, hyperlipidemia, COPD on 4 L nasal cannula, GERD, who presents emergency department from [facility] [facility] facility for chief concerns of shortness of breath. ?She had a significant hypoxemia with oxygen saturation at 77%.  She was placed on 100% oxygen with nonrebreather.  She also had significant elevation of BNP, started IV Lasix for acute on chronic diastolic congestive heart failure. ?  ? ? ? ?Consultants:  ? ? ?Procedures:  ? ?Antimicrobials:  ?  ? ? ?Subjective: ?She c/o abd pain. Son told me took Maalox and felt better.  Shortness of breath improving.  No chest pain ? ?Objective: ?Vitals:  ? 04/18/22 0427 04/18/22 0754 04/18/22 1155 04/18/22 1522  ?BP: (!) 116/54 (!) 101/55 (!) 92/59 (!) 111/58  ?Pulse: 91 94 76 84  ?Resp: '20 18 18 17  '$ ?Temp: (!) 97.5 ?F (36.4 ?C) 97.9 ?F (36.6 ?C) 97.9 ?F (36.6 ?C) (!) 97.2 ?F (36.2 ?C)  ?TempSrc:      ?SpO2: 100% 99% 90% 91%  ? ?No intake or output data in the 24 hours ending 04/18/22 1738 ?There were no vitals filed for this visit. ? ?Examination: ?Calm, NAD ?Decreased breath sounds at bases, no wheezing ?Reg s1/s2 no gallop ?Soft benign +bs ?No edema ?Awake and alert ?Mood and affect appropriate in current setting  ? ? ?Data Reviewed: I have personally reviewed following labs and imaging studies ? ?CBC: ?Recent Labs  ?Lab 04/13/22 ?1529 04/14/22 ?7846 05/05/2022 ?2037 04/17/22 ?9629  ?WBC 5.8 5.0 5.9 6.1  ?NEUTROABS 4.4  --  4.6  --   ?HGB 12.4 12.6 12.4 11.5*  ?HCT 40.6 41.6 40.9 37.4  ?MCV 100.2* 100.7* 102.5* 99.7  ?PLT 267 251 245 223  ? ?Basic Metabolic Panel: ?Recent Labs  ?Lab 04/13/22 ?1529 04/14/22 ?5284 05/07/2022 ?2037 04/17/22 ?1324 04/18/22 ?4010  ?NA 139 141 138 140 140  ?K 4.2 3.2* 4.1 3.7 3.4*  ?CL 98 101 97* 99  99  ?CO2 33* 34* 35* 35* 36*  ?GLUCOSE 78 78 118* 104* 97  ?BUN 41* 34* 27* 23 19  ?CREATININE 0.77 0.66 0.64 0.63 0.51  ?CALCIUM 8.4* 8.2* 8.6* 8.3* 8.4*  ?MG  --   --   --   --  2.4  ? ?GFR: ?Estimated Creatinine Clearance: 26.1 mL/min (by C-G formula based on SCr of 0.51 mg/dL). ?Liver Function Tests: ?Recent Labs  ?Lab 04/13/22 ?1529 04/15/2022 ?2037  ?AST 32 46*  ?ALT 24 33  ?ALKPHOS 97 80  ?BILITOT 0.5 0.4  ?PROT 6.6 6.3*  ?ALBUMIN 2.8* 2.7*  ? ?No results for input(s): LIPASE, AMYLASE in the last 168 hours. ?No results for input(s): AMMONIA in the last 168 hours. ?Coagulation Profile: ?Recent Labs  ?Lab 04/13/22 ?1529  ?INR 0.9  ? ?Cardiac Enzymes: ?No results for input(s): CKTOTAL, CKMB, CKMBINDEX, TROPONINI in the last 168 hours. ?BNP (last 3 results) ?No results for input(s): PROBNP in the last 8760 hours. ?HbA1C: ?No results for input(s): HGBA1C in the last 72 hours. ?CBG: ?No results for input(s): GLUCAP in the last 168 hours. ?Lipid Profile: ?No results for input(s): CHOL, HDL, LDLCALC, TRIG, CHOLHDL, LDLDIRECT in the last 72 hours. ?Thyroid Function Tests: ?No results for input(s): TSH, T4TOTAL, FREET4, T3FREE, THYROIDAB in the last 72 hours. ?Anemia Panel: ?No results for input(s): VITAMINB12, FOLATE,  FERRITIN, TIBC, IRON, RETICCTPCT in the last 72 hours. ?Sepsis Labs: ?Recent Labs  ?Lab 05/01/2022 ?2037 04/17/22 ?2250  ?PROCALCITON 0.19  --   ?LATICACIDVEN 1.2 0.8  ? ? ?Recent Results (from the past 240 hour(s))  ?Body fluid culture w Gram Stain     Status: None (Preliminary result)  ? Collection Time: 04/17/22  9:20 AM  ? Specimen: PATH Cytology Pleural fluid  ?Result Value Ref Range Status  ? Specimen Description   Final  ?  PLEURAL ?Performed at [facility], [address] [place], [place] [address] ?  ? Special Requests   Final  ?  NONE ?Performed at [facility], [address] [place],  [address] ?  ? Gram Stain   Final  ?  WBC PRESENT, PREDOMINANTLY PMN ?NO ORGANISMS  SEEN ?CYTOSPIN SMEAR ?  ? Culture   Final  ?  NO GROWTH < 24 HOURS ?Performed at [facility], [place] [address], [place],  [address] ?  ? Report Status PENDING  Incomplete  ?  ? ? ? ? ? ?Radiology Studies: ?CT Angio Chest PE W and/or Wo Contrast ? ?Result Date: 04/09/2022 ?CLINICAL DATA:  Pulmonary embolism (PE) suspected, high prob. Hypoxia. EXAM: CT ANGIOGRAPHY CHEST WITH CONTRAST TECHNIQUE: Multidetector CT imaging of the chest was performed using the standard protocol during bolus administration of intravenous contrast. Multiplanar CT image reconstructions and MIPs were obtained to evaluate the vascular anatomy. RADIATION DOSE REDUCTION: This exam was performed according to the departmental dose-optimization program which includes automated exposure control, adjustment of the mA and/or kV according to patient size and/or use of iterative reconstruction technique. CONTRAST:  27m OMNIPAQUE IOHEXOL 350 MG/ML SOLN COMPARISON:  01/08/2018 FINDINGS: Cardiovascular: Moderate cardiomegaly, stable since prior examination. Minimal coronary artery calcification. No pericardial effusion. Central pulmonary arteries are enlarged in keeping with changes of pulmonary arterial hypertension. Mild atherosclerotic calcification within the thoracic aorta. No aortic aneurysm. Mediastinum/Nodes: No enlarged mediastinal, hilar, or axillary lymph nodes. Thyroid gland, trachea, and esophagus demonstrate no significant findings. Lungs/Pleura: Large right and moderate left pleural effusions are present, enlarged since prior examination, with near complete collapse of the right middle and lower lobe and compressive atelectasis of the left lower lobe. Centrilobular emphysema again noted. No central obstructing mass. No pneumothorax. Upper Abdomen: Mild ascites is again seen. Indeterminate low-attenuation lesion within the right hepatic lobe is better characterized as a simple cyst on prior MRI examination of 06/06/2010. 2.4 cm  enhancing mass is seen within the lateral cortex of the left kidney in keeping with a primary renal malignancy, new since prior MRI examination. Musculoskeletal: Multiple remote bilateral rib fractures are identified. Remote T9 compression deformity is present, stable since prior examination. Review of the MIP images confirms the above findings. IMPRESSION: No acute pulmonary embolism. Moderate cardiomegaly. Morphologic changes in keeping with pulmonary arterial hypertension. Enlarging bilateral pleural effusions, right greater than left, with compressive atelectasis of the lung bases bilaterally, right greater than left. Emphysema 2.4 cm enhancing mass within the left kidney most in keeping with a primary renal neoplasm. This would be better assessed with dedicated renal mass protocol CT or MRI examination, if indicated. Mild ascites Aortic Atherosclerosis (ICD10-I70.0) and Emphysema (ICD10-J43.9). Electronically Signed   By: AFidela SalisburyM.D.   On: 05/07/2022 22:39  ? ?DG Chest Port 1 View ? ?Result Date: 04/17/2022 ?CLINICAL DATA:  Pneumothorax EXAM: PORTABLE CHEST 1 VIEW COMPARISON:  Earlier today FINDINGS: No definite or increasing pneumothorax on the right. Unchanged reticular  pulmonary opacities with small pleural effusions. Cardiopericardial enlargement. IMPRESSION: No definite or increasing pneumothorax.  Stable aeration. Electronically Signed   By: Jorje Guild M.D.   On: 04/17/2022 11:46  ? ?DG Chest Port 1 View ? ?Result Date: 04/17/2022 ?CLINICAL DATA:  Right thoracentesis. EXAM: PORTABLE CHEST 1 VIEW COMPARISON:  Chest x-ray May 5, 23. FINDINGS: Similar enlargement the cardiac silhouette. No mediastinal shift. Decreased right pleural effusion, now trace. Suspected trace right pneumothorax. Increased left basilar opacities. IMPRESSION: 1. Decreased right pleural effusion status post thoracentesis. Suspected trace right pneumothorax. No evidence of tension. 2. Increased left basilar opacities, which  could represent atelectasis and/or pneumonia. These results will be called to the ordering clinician or representative by the Radiologist Assistant, and communication documented in the PACS or [employer]

## 2022-04-18 NOTE — Evaluation (Signed)
Physical Therapy Evaluation ?Patient Details ?Name: Jennifer Skinner ?MRN: [id number] ?DOB: 1930/12/31 ?Today's Date: 04/18/2022 ? ?History of Present Illness ? Pt is an 88 y/o F presenting with acute onset of LLE swelling & pain & has been unable to ambulate since. Pt found to have L fibular fx. PMH: COPD, GERD, dyslipidemia, mitral valve prolapse & regurgitation, osteoporosis, lumbar DDD. Patient was discharged to SNF and came right back.Had 500 ml of fluid drained during thoracentesis.  ?  ?Clinical Impression ? Patient received in bed, son and patient report she thinks she wet bed. Found to have diaper on. Patient is min guard for supine to sit. Min A for sit to stand. Difficulty taking steps/weight shifting due to pain in L LE (fibula fx). Patient will continue to benefit from skilled PT to improve functional mobility and strength.     ?   ? ?Recommendations for follow up therapy are one component of a multi-disciplinary discharge planning process, led by the attending physician.  Recommendations may be updated based on patient status, additional functional criteria and insurance authorization. ? ?Follow Up Recommendations Skilled nursing-short term rehab (<3 hours/day) ? ?  ?Assistance Recommended at Discharge Frequent or constant Supervision/Assistance  ?Patient can return home with the following ? A lot of help with walking and/or transfers;A lot of help with bathing/dressing/bathroom;Assist for transportation;Assistance with cooking/housework;Help with stairs or ramp for entrance;Direct supervision/assist for medications management ? ?  ?Equipment Recommendations Other (comment) (TBD)  ?Recommendations for Other Services ?    ?  ?Functional Status Assessment Patient has had a recent decline in their functional status and demonstrates the ability to make significant improvements in function in a reasonable and predictable amount of time.  ? ?  ?Precautions / Restrictions Precautions ?Precautions:  Fall ?Restrictions ?Weight Bearing Restrictions: Yes ?LLE Weight Bearing: Weight bearing as tolerated ?Other Position/Activity Restrictions: Per ortho note:  "Patient can be weightbearing as tolerated with no restrictions on knee range of motion"  ? ?  ? ?Mobility ? Bed Mobility ?Overal bed mobility: Needs Assistance ?Bed Mobility: Supine to Sit ?  ?  ?Supine to sit: Min guard, HOB elevated ?  ?  ?General bed mobility comments: vc's for technique ?  ? ?Transfers ?Overall transfer level: Needs assistance ?Equipment used: Rolling walker (2 wheels) ?Transfers: Sit to/from Stand ?Sit to Stand: Min assist ?  ?  ?  ?  ?  ?  ?  ? ?Ambulation/Gait ?Ambulation/Gait assistance: Min assist ?Gait Distance (Feet): 2 Feet ?Assistive device: Rolling walker (2 wheels) ?Gait Pattern/deviations: Step-to pattern, Decreased weight shift to left, Decreased step length - right, Trunk flexed ?Gait velocity: decr ?  ?  ?General Gait Details: patient with difficulty advancing right LE due to pain in left LE. Able to pivot with min/mod A to recliner ? ?Stairs ?  ?  ?  ?  ?  ? ?Wheelchair Mobility ?  ? ?Modified Rankin (Stroke Patients Only) ?  ? ?  ? ?Balance Overall balance assessment: Needs assistance ?Sitting-balance support: Feet supported ?Sitting balance-Leahy Scale: Fair ?Sitting balance - Comments: trunk significantly flexed forward in sitting ?  ?Standing balance support: Bilateral upper extremity supported, During functional activity, Reliant on assistive device for balance ?Standing balance-Leahy Scale: Poor ?  ?  ?  ?  ?  ?  ?  ?  ?  ?  ?  ?  ?   ? ? ? ?Pertinent Vitals/Pain Pain Assessment ?Pain Assessment: Faces ?Faces Pain Scale: Hurts little more ?Pain Location: back, L  LE with weight bearing ?Pain Descriptors / Indicators: Discomfort ?Pain Intervention(s): Monitored during session, Repositioned  ? ? ?Home Living Family/patient expects to be discharged to:: Skilled nursing facility ?Living Arrangements: Alone ?Available Help  at Discharge: Family;Available PRN/intermittently ?Type of Home: House ?Home Access: Stairs to enter ?  ?Entrance Stairs-Number of Steps: 3 ?  ?Home Layout: One level ?Home Equipment: Conservation officer, nature (2 wheels) ?   ?  ?Prior Function   ?  ?  ?  ?  ?  ?  ?Mobility Comments: pt reports she ind without AD household distances prior to fibula fx ?ADLs Comments: sits in bath for bathing with MOD I per pt report, son assisting with LB dressing recently; pt reports she is MOD I in UB dressing/grooming tasks; son grocery shops and prepares meals ?  ? ? ?Hand Dominance  ?   ? ?  ?Extremity/Trunk Assessment  ? Upper Extremity Assessment ?Upper Extremity Assessment: Generalized weakness ?  ? ?Lower Extremity Assessment ?Lower Extremity Assessment: Generalized weakness ?  ? ?Cervical / Trunk Assessment ?Cervical / Trunk Assessment: Kyphotic  ?Communication  ? Communication: HOH  ?Cognition Arousal/Alertness: Awake/alert ?Behavior During Therapy: [facility] for tasks assessed/performed ?Overall Cognitive Status: History of cognitive impairments - at baseline ?  ?  ?  ?  ?  ?  ?  ?  ?  ?  ?  ?  ?  ?  ?  ?  ?General Comments: Pt is limited by pain ?  ?  ? ?  ?General Comments   ? ?  ?Exercises    ? ?Assessment/Plan  ?  ?PT Assessment Patient needs continued PT services  ?PT Problem List Decreased strength;Decreased mobility;Decreased safety awareness;Decreased range of motion;Decreased activity tolerance;Decreased cognition;Cardiopulmonary status limiting activity;Decreased knowledge of precautions;Pain;Decreased knowledge of use of DME;Decreased balance ? ?   ?  ?PT Treatment Interventions DME instruction;Therapeutic activities;Gait training;Therapeutic exercise;Patient/family education;Balance training;Functional mobility training;Neuromuscular re-education   ? ?PT Goals (Current goals can be found in the Care Plan section)  ?Acute Rehab PT Goals ?Patient Stated Goal: decreased pain, get moving ?PT Goal Formulation: With  patient/family ?Time For Goal Achievement: 05/02/22 ?Potential to Achieve Goals: Fair ? ?  ?Frequency Min 2X/week ?  ? ? ?Co-evaluation   ?  ?  ?  ?  ? ? ?  ?AM-PAC PT "6 Clicks" Mobility  ?Outcome Measure Help needed turning from your back to your side while in a flat bed without using bedrails?: A Lot ?Help needed moving from lying on your back to sitting on the side of a flat bed without using bedrails?: A Little ?Help needed moving to and from a bed to a chair (including a wheelchair)?: A Lot ?Help needed standing up from a chair using your arms (e.g., wheelchair or bedside chair)?: A Little ?Help needed to walk in hospital room?: Total ?Help needed climbing 3-5 steps with a railing? : Total ?6 Click Score: 12 ? ?  ?End of Session Equipment Utilized During Treatment: Oxygen ?Activity Tolerance: Patient limited by pain ?Patient left: in chair;with call bell/phone within reach;with family/visitor present ?Nurse Communication: Mobility status ?PT Visit Diagnosis: Other abnormalities of gait and mobility (R26.89);Muscle weakness (generalized) (M62.81);Pain;Difficulty in walking, not elsewhere classified (R26.2);Unsteadiness on feet (R26.81) ?Pain - Right/Left: Left ?Pain - part of body: Leg ?  ? ?Time: 2353-6144 ?PT Time Calculation (min) (ACUTE ONLY): 30 min ? ? ?Charges:   PT Evaluation ?$PT Eval Moderate Complexity: 1 Mod ?PT Treatments ?$Therapeutic Activity: 8-22 mins ?  ?   ? ? ?  Amanda Cockayne, PT, GCS ?04/18/22,3:49 PM ? ? ?

## 2022-04-18 NOTE — Consult Note (Signed)
PHARMACIST - PHYSICIAN COMMUNICATION ? ?CONCERNING:  Enoxaparin (Lovenox) for DVT Prophylaxis  ? ? ?RECOMMENDATION: ?There is no height or weight on file to calculate BMI. ? ?Estimated Creatinine Clearance: 26.1 mL/min (by C-G formula based on SCr of 0.51 mg/dL). ? ? ?Based on [place] patient is candidate for  ?Patient is candidate for enoxaparin '30mg'$  every 24 hours based on CrCl <13m/min or Weight <45kg ? ?DESCRIPTION: ?Pharm[facility] has [facility] ? ?Patient is now receiving enoxaparin 30 mg every 24 hours  ? ?Kyilee Gregg Rodriguez-Guzman PharmD, BCPS ?04/18/2022 6:02 PM ? ?

## 2022-04-18 NOTE — TOC Initial Note (Signed)
Transition of Care (TOC) - Initial/Assessment Note  ? ? ?Patient Details  ?Name: Jennifer Skinner ?MRN: [id number] ?Date of Birth: 04-15-1931 ? ?Transition of Care (TOC) CM/SW Contact:    ?Laurena Slimmer, RN ?Phone Number: ?04/18/2022, 3:28 PM ? ?Clinical Narrative:                 ?Patient is from [facility]. Confirmed with Admission Coordinator, Audry Pili patient can return to facility. Audry Pili stated patient was never declined from returning  to facility. Patient will require a new insurance authorization.  ? ?  ?  ? ? ?Patient Goals and CMS Choice ?  ?  ?  ? ?Expected Discharge Plan and Services ?  ?  ?  ?  ?  ?                ?  ?  ?  ?  ?  ?  ?  ?  ?  ?  ? ?Prior Living Arrangements/Services ?  ?  ?  ?       ?  ?  ?  ?  ? ?Activities of Daily Living ?Home Assistive Devices/Equipment: None ?ADL Screening (condition at time of admission) ?Patient's cognitive ability adequate to safely complete daily activities?: Yes ?Is the patient deaf or have difficulty hearing?: No ?Does the patient have difficulty seeing, even when wearing glasses/contacts?: No ?Does the patient have difficulty concentrating, remembering, or making decisions?: No ?Patient able to express need for assistance with ADLs?: Yes ?Does the patient have difficulty dressing or bathing?: Yes ?Independently performs ADLs?: No ?Communication: Independent ?Dressing (OT): Needs assistance ?Grooming: Needs assistance ?Feeding: Independent ?Bathing: Needs assistance ?Toileting: Needs assistance ?In/Out Bed: Needs assistance ?Walks in Home: Needs assistance ?Does the patient have difficulty walking or climbing stairs?: Yes ?Weakness of Legs: Both ?Weakness of Arms/Hands: Both ? ?Permission Sought/Granted ?  ?  ?   ?   ?   ?   ? ?Emotional Assessment ?  ?  ?  ?  ?  ?  ? ?Admission diagnosis:  Pleural effusion [J90] ?S/P thoracentesis [Z98.890] ?Acute exacerbation of CHF (congestive heart failure) ([place]) [I50.9] ?Acute on chronic systolic congestive heart failure  ([place]) [I50.23] ?Acute on chronic respiratory failure with hypoxemia ([place]) [J96.21] ?Acute on chronic diastolic (congestive) heart failure ([place]) [I50.33] ?Patient Active Problem List  ? Diagnosis Date Noted  ? Pleural effusion due to CHF (congestive heart failure) ([place]) 04/17/2022  ? Acute on chronic diastolic (congestive) heart failure ([place]) 04/17/2022  ? Acute exacerbation of CHF (congestive heart failure) ([place]) 04/09/2022  ? Unable to ambulate 04/13/2022  ? Dyslipidemia 04/13/2022  ? Pulmonary hypertension ([place]) 04/13/2022  ? Acute on chronic diastolic CHF (congestive heart failure) ([place]) 04/13/2022  ? Peripheral neuropathy 04/13/2022  ? Acute on chronic respiratory failure with hypoxia ([place]) 04/13/2022  ? Pelvic floor weakness in female 11/16/2021  ? Cystocele with prolapse 11/16/2021  ? Chronic left hip pain 07/02/2019  ? Primary osteoarthritis of both hips 07/02/2019  ? OAB (overactive bladder) 06/10/2018  ? Elevated hemoglobin A1c 03/07/2018  ? Protein-calorie malnutrition, severe ([place]) 01/10/2018  ? Pulmonary HTN ([place]) 01/08/2018  ? Lymphedema 10/28/2017  ? Hyperlipidemia 10/08/2017  ? Chronic back pain 08/26/2017  ? Other constipation 08/19/2017  ? Essential hypertension 03/13/2016  ? COPD (chronic obstructive pulmonary disease) ([place])   ? Chronic diastolic CHF (congestive heart failure) ([place])   ? 1st degree AV block   ? Interstitial lung disease ([place]) 10/17/2015  ? Aspiration pneumonia of left lower  lobe ([place]) 10/10/2015  ? Hypothyroidism 09/27/2015  ? Lumbar and sacral osteoarthritis 03/11/2015  ? Degeneration of intervertebral disc of lumbar region 07/13/2014  ? Lumbar radiculitis 07/13/2014  ? Lumbar stenosis with neurogenic claudication 07/13/2014  ? History of neutropenia 03/31/2013  ? ION (ischemic optic neuropathy) 09/30/2012  ? OP (osteoporosis) 09/30/2012  ? MITRAL REGURGITATION 04/12/2009  ? Mitral valve disorder 04/12/2009  ? GERD 04/12/2009  ? ?PCP:  Olin Hauser, DO ?Pharmacy:    ?[place], [place] - [address] ?[address] ?[place] [address] ?Phone: [phone] Fax: [phone] ? ? ? ? ?Social Determinants of Health (SDOH) Interventions ?  ? ?Readmission Risk Interventions ?   ? View : No data to display.  ?  ?  ?  ? ? ? ?

## 2022-04-19 ENCOUNTER — Inpatient Hospital Stay: Payer: PPO

## 2022-04-19 DIAGNOSIS — Z66 Do not resuscitate: Secondary | ICD-10-CM

## 2022-04-19 DIAGNOSIS — L8990 Pressure ulcer of unspecified site, unspecified stage: Secondary | ICD-10-CM | POA: Insufficient documentation

## 2022-04-19 DIAGNOSIS — E440 Moderate protein-calorie malnutrition: Secondary | ICD-10-CM | POA: Insufficient documentation

## 2022-04-19 LAB — COMP PANEL: LEUKEMIA/LYMPHOMA

## 2022-04-19 MED ORDER — POTASSIUM CHLORIDE CRYS ER 20 MEQ PO TBCR
40.0000 meq | EXTENDED_RELEASE_TABLET | Freq: Once | ORAL | Status: AC
  Administered 2022-04-19: 40 meq via ORAL
  Filled 2022-04-19: qty 2

## 2022-04-19 MED ORDER — IOHEXOL 300 MG/ML  SOLN
75.0000 mL | Freq: Once | INTRAMUSCULAR | Status: AC | PRN
  Administered 2022-04-19: 75 mL via INTRAVENOUS

## 2022-04-19 NOTE — Progress Notes (Signed)
?PROGRESS NOTE ? ? ? ?Jennifer Skinner  EGB:[id number] DOB: May 22, 1931 DOA: 05/06/2022 ?PCP: Jennifer Hauser, DO  ? ? ?Brief Narrative:  ?[name] is a 88 year old female with neuropathy, hypothyroid, hypertension, pulmonary hypertension, hyperlipidemia, COPD on 4 L nasal cannula, GERD, who presents emergency department from [facility] facility for chief concerns of shortness of breath. ?She had a significant hypoxemia with oxygen saturation at 77%.  She was placed on 100% oxygen with nonrebreather.  She also had significant elevation of BNP, started IV Lasix for acute on chronic diastolic congestive heart failure. ?  ? ?5/11 mildly sob sitting in bed. Son at bedside.  He wants to proceed with CT renal.  He spoke to patient and he wants this to be done while inpatient. ? ?Consultants:  ? ? ?Procedures:  ? ?Antimicrobials:  ?  ? ? ?Subjective: ?No abd pain today, no n/v ? ?Objective: ?Vitals:  ? 04/19/22 0541 04/19/22 0728 04/19/22 1222 04/19/22 1256  ?BP:  (!) 107/44 (!) 81/40   ?Pulse:  100 90   ?Resp:  20 19   ?Temp:  97.9 ?F (36.6 ?C) 98 ?F (36.7 ?C)   ?TempSrc:      ?SpO2:  93% (!) 84% 92%  ?Weight: 36.6 kg     ? ? ?Intake/Output Summary (Last 24 hours) at 04/19/2022 1521 ?Last data filed at 04/19/2022 1021 ?Gross per 24 hour  ?Intake 240 ml  ?Output --  ?Net 240 ml  ? ?Filed Weights  ? 04/19/22 0541  ?Weight: 36.6 kg  ? ? ?Examination: ?Calm, NAD ?Coarse bs, mild scattered rales. No wheezing ?Reg s1/s2 no gallop ?Soft benign +bs ?No edema ?Awake and alert ?Mood and affect appropriate in current setting  ? ? ?Data Reviewed: I have personally reviewed following labs and imaging studies ? ?CBC: ?Recent Labs  ?Lab 04/13/22 ?1529 04/14/22 ?3710 04/13/2022 ?2037 04/17/22 ?6269  ?WBC 5.8 5.0 5.9 6.1  ?NEUTROABS 4.4  --  4.6  --   ?HGB 12.4 12.6 12.4 11.5*  ?HCT 40.6 41.6 40.9 37.4  ?MCV 100.2* 100.7* 102.5* 99.7  ?PLT 267 251 245 223  ? ?Basic Metabolic Panel: ?Recent Labs  ?Lab 04/13/22 ?1529  04/14/22 ?4854 04/25/2022 ?2037 04/17/22 ?6270 04/18/22 ?3500  ?NA 139 141 138 140 140  ?K 4.2 3.2* 4.1 3.7 3.4*  ?CL 98 101 97* 99 99  ?CO2 33* 34* 35* 35* 36*  ?GLUCOSE 78 78 118* 104* 97  ?BUN 41* 34* 27* 23 19  ?CREATININE 0.77 0.66 0.64 0.63 0.51  ?CALCIUM 8.4* 8.2* 8.6* 8.3* 8.4*  ?MG  --   --   --   --  2.4  ? ?GFR: ?Estimated Creatinine Clearance: 26.1 mL/min (by C-G formula based on SCr of 0.51 mg/dL). ?Liver Function Tests: ?Recent Labs  ?Lab 04/13/22 ?1529 04/27/2022 ?2037  ?AST 32 46*  ?ALT 24 33  ?ALKPHOS 97 80  ?BILITOT 0.5 0.4  ?PROT 6.6 6.3*  ?ALBUMIN 2.8* 2.7*  ? ?No results for input(s): LIPASE, AMYLASE in the last 168 hours. ?No results for input(s): AMMONIA in the last 168 hours. ?Coagulation Profile: ?Recent Labs  ?Lab 04/13/22 ?1529  ?INR 0.9  ? ?Cardiac Enzymes: ?No results for input(s): CKTOTAL, CKMB, CKMBINDEX, TROPONINI in the last 168 hours. ?BNP (last 3 results) ?No results for input(s): PROBNP in the last 8760 hours. ?HbA1C: ?No results for input(s): HGBA1C in the last 72 hours. ?CBG: ?No results for input(s): GLUCAP in the last 168 hours. ?Lipid Profile: ?No results for  input(s): CHOL, HDL, LDLCALC, TRIG, CHOLHDL, LDLDIRECT in the last 72 hours. ?Thyroid Function Tests: ?No results for input(s): TSH, T4TOTAL, FREET4, T3FREE, THYROIDAB in the last 72 hours. ?Anemia Panel: ?No results for input(s): VITAMINB12, FOLATE, FERRITIN, TIBC, IRON, RETICCTPCT in the last 72 hours. ?Sepsis Labs: ?Recent Labs  ?Lab 05/01/2022 ?2037 04/17/22 ?2250  ?PROCALCITON 0.19  --   ?LATICACIDVEN 1.2 0.8  ? ? ?Recent Results (from the past 240 hour(s))  ?Body fluid culture w Gram Stain     Status: None (Preliminary result)  ? Collection Time: 04/17/22  9:20 AM  ? Specimen: PATH Cytology Pleural fluid  ?Result Value Ref Range Status  ? Specimen Description   Final  ?  PLEURAL ?Performed at [facility], [address] [place], [place] [address] ?  ? Special Requests   Final  ?  NONE ?Performed at [facility], [address] [place], [place] [address] ?  ? Gram Stain   Final  ?  WBC PRESENT, PREDOMINANTLY PMN ?NO ORGANISMS SEEN ?CYTOSPIN SMEAR ?  ? Culture   Final  ?  NO GROWTH 2 DAYS ?Performed at [facility], [place] [address] [place], [place] [address] ?  ? Report Status PENDING  Incomplete  ?  ? ? ? ? ? ?Radiology Studies: ?No results found. ? ? ? ? ? ?Scheduled Meds: ? aspirin EC  81 mg Oral Daily  ? enoxaparin (LOVENOX) injection  30 mg Subcutaneous Q24H  ? feeding supplement  237 mL Oral BID BM  ? furosemide  20 mg Intravenous Q12H  ? gabapentin  100 mg Oral TID  ? levothyroxine  25 mcg Oral Q0600  ? metoprolol tartrate  12.5 mg Oral BID  ? mirabegron ER  25 mg Oral Daily  ? rosuvastatin  10 mg Oral QHS  ? sildenafil  20 mg Oral TID  ? ?Continuous Infusions: ? ?Assessment & Plan: ?  ?Principal Problem: ?  Pleural effusion due to CHF (congestive heart failure) ([place]) ?Active Problems: ?  Acute on chronic diastolic CHF (congestive heart failure) ([place]) ?  Acute on chronic respiratory failure with hypoxia (H[facility]) ?  Hypothyroidism ?  GERD ?  Aspiration pneumonia of left lower lobe ([place]) ?  Essential hypertension ?  Pulmonary HTN ([place]) ?  Protein-calorie malnutrition, severe ([place]) ?  Dyslipidemia ?  Peripheral neuropathy ?  Acute exacerbation of CHF (congestive heart failure) ([place]) ?  Acute on chronic diastolic (congestive) heart failure ([place]) ?  Malnutrition of moderate degree ?  Pressure injury of skin ? ? ?Acute on chronic hypoxemic respite failure. ?Acute on chronic diastolic congestive heart failure. ?Bilateral pleural effusion, more on the right side ?Possible left lower lobe aspiration pneumonia ?Patient was on 3 L oxygen at baseline.  S ?5/10 no clinical evidence of pna, will d/c abx ?S/p Rt thoracentesis- 500Ml clear yellow fluid ?5/11 still volume overloaded.  Will continue with Lasix IV 20 twice daily.  Would like to change to 3 times daily if BP allows ?Hold beta-blockers so we can increase  Lasix dose ? ? ?Lt Renal mass ?On CT- new ?Family and patient would like to proceed with CT renal to evaluate the renal mass ? ?  ?Pulmonary hypertension. ?Continue sildenafil ? ?Hypokalemia ?Due to diuresis ?Give potassium x140 since still on IV Lasix ?Monitor levels ? ?Essential hypertension. ?Hold beta-blocker since on IV Lasix and BP on lower side ? ?Severe protein calorie malnutrition ?Continue protein supplements ? ? ?DVT prophylaxis: lovenox ?Code Status: DNR ?  Family Communication: Son updated ?Disposition Plan: SNF pending ?Status is: Inpatient ?Remains inpatient appropriate because: IV treatment SNF pending ?  ? ? ? ? ? LOS: 2 days  ? ?Time spent: 35 minutes ? ? ? ?Nolberto Hanlon, MD ?[facility] ?Pager [phone] ? ?If 7PM-7AM, please contact night-coverage ?04/19/2022, 3:21 PM   ?

## 2022-04-19 NOTE — Plan of Care (Signed)

## 2022-04-19 NOTE — Progress Notes (Signed)
?                                                   ?Daily Progress Note  ? ?Patient Name: Jennifer Skinner       Date: 04/19/2022 ?DOB: Jun 10, 1931  Age: 88 y.o. MRN#: [id number] ?Attending Physician: Nolberto Hanlon, MD ?Primary Care Physician: Olin Hauser, DO ?Admit Date: 04/12/2022 ? ?Reason for Consultation/Follow-up: Establishing goals of care ? ?Subjective: ?Having difficulty feeding herself this morning ?Oxygen increased to 5 L but no shortness of breath ? ?Length of Stay: 2 ? ?Current Medications: ?Scheduled Meds:  ? aspirin EC  81 mg Oral Daily  ? enoxaparin (LOVENOX) injection  30 mg Subcutaneous Q24H  ? feeding supplement  237 mL Oral BID BM  ? furosemide  20 mg Intravenous Q12H  ? gabapentin  100 mg Oral TID  ? levothyroxine  25 mcg Oral Q0600  ? metoprolol tartrate  12.5 mg Oral BID  ? mirabegron ER  25 mg Oral Daily  ? rosuvastatin  10 mg Oral QHS  ? sildenafil  20 mg Oral TID  ? ? ?Continuous Infusions: ? ? ?PRN Meds: ?acetaminophen **OR** acetaminophen, alum & mag hydroxide-simeth, ondansetron **OR** ondansetron (ZOFRAN) IV, polyethylene glycol, traMADol ? ?Physical Exam ?Constitutional:   ?   General: She is not in acute distress. ?   Appearance: She is ill-appearing.  ?Pulmonary:  ?   Effort: Pulmonary effort is normal.  ?Skin: ?   General: Skin is warm and dry.  ?Neurological:  ?   Mental Status: She is alert and oriented to person, place, and time.  ?         ? ?Vital Signs: BP (!) 107/44 (BP Location: Right Arm)   Pulse 100   Temp 97.9 ?F (36.6 ?C)   Resp 20   Wt 36.6 kg   SpO2 93%   BMI 18.75 kg/m?  ?SpO2: SpO2: 93 % ?O2 Device: O2 Device: Nasal Cannula ?O2 Flow Rate: O2 Flow Rate (L/min): 5 L/min ? ?Intake/output summary:  ?Intake/Output Summary (Last 24 hours) at 04/19/2022 1113 ?Last data filed at 04/19/2022 1021 ?Gross per 24 hour  ?Intake 240  ml  ?Output --  ?Net 240 ml  ? ?LBM: Last BM Date : 04/18/2022 ?Baseline Weight: Weight: 36.6 kg ?Most recent weight: Weight: 36.6 kg ? ?     ?Palliative Assessment/Data: PPS 50% ? ? ? ?Flowsheet Rows   ? ?Flowsheet Row Most Recent Value  ?Intake Tab   ?Referral Department Hospitalist  ?Unit at Time of Referral Intermediate Care Unit  ?Palliative Care Primary Diagnosis Cardiac  ?Date Notified 04/17/22  ?Palliative Care Type New Palliative care  ?Reason for referral Clarify Goals of Care  ?Date of Admission 04/19/2022  ?Date first seen by Palliative Care 04/17/22  ?# of days Palliative referral response time 0 Day(s)  ?# of days IP prior to Palliative referral 1  ?Clinical Assessment   ?Psychosocial & Spiritual Assessment   ?Palliative Care Outcomes   ? ?  ? ? ?Patient Active Problem List  ? Diagnosis Date Noted  ? Malnutrition of moderate degree 04/19/2022  ? Pressure injury of skin 04/19/2022  ? Pleural effusion due to CHF (congestive heart failure) ([place]) 04/17/2022  ? Acute on chronic diastolic (congestive) heart failure ([place]) 04/17/2022  ? Acute exacerbation of CHF (congestive heart failure) ([place]) 04/09/2022  ?  Unable to ambulate 04/13/2022  ? Dyslipidemia 04/13/2022  ? Pulmonary hypertension ([place]) 04/13/2022  ? Acute on chronic diastolic CHF (congestive heart failure) ([place]) 04/13/2022  ? Peripheral neuropathy 04/13/2022  ? Acute on chronic respiratory failure with hypoxia ([place]) 04/13/2022  ? Pelvic floor weakness in female 11/16/2021  ? Cystocele with prolapse 11/16/2021  ? Chronic left hip pain 07/02/2019  ? Primary osteoarthritis of both hips 07/02/2019  ? OAB (overactive bladder) 06/10/2018  ? Elevated hemoglobin A1c 03/07/2018  ? Protein-calorie malnutrition, severe ([place]) 01/10/2018  ? Pulmonary HTN ([place]) 01/08/2018  ? Lymphedema 10/28/2017  ? Hyperlipidemia 10/08/2017  ? Chronic back pain 08/26/2017  ? Other constipation 08/19/2017  ? Essential hypertension 03/13/2016  ? COPD (chronic obstructive pulmonary  disease) ([place])   ? Chronic diastolic CHF (congestive heart failure) ([place])   ? 1st degree AV block   ? Interstitial lung disease ([place]) 10/17/2015  ? Aspiration pneumonia of left lower lobe ([place]) 10/10/2015  ? Hypothyroidism 09/27/2015  ? Lumbar and sacral osteoarthritis 03/11/2015  ? Degeneration of intervertebral disc of lumbar region 07/13/2014  ? Lumbar radiculitis 07/13/2014  ? Lumbar stenosis with neurogenic claudication 07/13/2014  ? History of neutropenia 03/31/2013  ? ION (ischemic optic neuropathy) 09/30/2012  ? OP (osteoporosis) 09/30/2012  ? MITRAL REGURGITATION 04/12/2009  ? Mitral valve disorder 04/12/2009  ? GERD 04/12/2009  ? ? ?Palliative Care Assessment & Plan  ? ?HPI: ?88 y.o. female  with past medical history of neuropathy, hypothyroid, hypertension, CHF, pulmonary hypertension, hyperlipidemia, COPD on 4 L nasal cannula, and GERD admitted on 05/04/2022 with shortness of breath.  Diagnosed with acute on chronic respiratory failure, acute on chronic CHF, bilateral pleural effusion, and possible left lower lobe aspiration pneumonia.  PMT consulted to discuss goals of care. ? ?Assessment: ?Follow-up today with patient and son ?Son shares about his conversation with Dr. Kurtis Bushman yesterday regarding patient's left renal mass -we reviewed patient likely not a good candidate for any aggressive treatment -son does share he would be interested in pursuing biopsy just to know results/estimate prognosis.  Encouraged him to ask further questions with physicians about what further work-up would include and if patient going through this process is worth the information that would be received. ?We also review patient's overall decline and frailty; son tells me he and the patient have talked about this and he is prepared for her passing away whenever it may come. ?We again review options of patient going home with extra support versus going to SNF.  Today we discussed option of involving hospice at home.  Son does not  feel he has enough support in the home to care for patient adequately at home and he would like to pursue rehab placement. ?Patient denies pain, denies symptom management needs. ?Discussed outpatient palliative referral and son is agreeable. ? ?Recommendations/Plan: ?Most completed -DNR, limited medical interventions, no feeding tube ?Son interested in SNF placement ?Son interested in pursuing further work-up of renal mass -not interested in treatment options just wants answers -encouraged him to have further discussion about what work-up would include and if gaining information is worth the process ?We will refer to outpatient palliative ? ?Code Status: ?DNR ? ?Discharge Planning: ?[place] for rehab with Palliative care service follow-up ? ?Care plan was discussed with patient and son ? ?Thank you for allowing the Palliative Medicine Team to assist in the care of this patient. ? ?*Please note that this is a verbal dictation therefore any spelling or grammatical errors are due to  the "[place]" system interpretation. ? ?Juel Burrow, DNP, AGNP-C ?Palliative Medicine Team ?Team Phone # [phone]  ?Pager [phone] ? ?

## 2022-04-19 NOTE — Consult Note (Signed)
?  Heart Failure Nurse Navigator Note ? ?HFpEF 55-60%.  Mild LVH.  Diastolic parameters indeterminate.  Moderately elevated pulmonary artery systolic pressures.  Severe left atrial enlargement.  Moderate right atrial enlargement.  And severe mitral regurgitation. ? ?Patient sent from [facility] due to complaints of shortness of breath and saturations of 77% on 5 L with improvement with use of nonrebreather to 89%.  Chest x-ray revealed bilateral pleural effusions with right greater than left. ? ?Comorbidities: ? ?Chronic respiratory failure at ?Hypothyroidism ?GERD ?Hypertension ?Hyperlipidemia ? ?Medications: ? ?Aspirin 81 mg daily ?Furosemide 20 mg IV every 12 ?Levothyroxine 25 mcg daily ?Metoprolol tartrate 12 and half milligrams 2 times a day ?Rosuvastatin 10 mg at bedtime ?Sildenafil 20 mg 3 times a day ? ?Labs: ? ?Sodium 140, potassium 3.4, chloride 99, CO2 36, BUN 19, creatinine 0.51, GFR greater than 60, magnesium 2.4 ?Weight is 36.6 kg ?Blood pressure 107/44 ?Intake and  output not documented ? ?Initial meeting with son and patient. ? ?He states prior to her femur fracture she was at home independently taking care of her self.  She states that she weighed herself every morning. ? ?Son states that he had fixed healthy meals for her and placed them in individual baggies so she could he doubles up on her own. ? ?She states rather than drinking fluids throughout the day she would suck on crushed ice. ? ?Discussed follow up in the outpatient heart failure clinic. ? ?Pricilla Riffle RN CHFN ?

## 2022-04-19 NOTE — Progress Notes (Signed)
[facility] Liaison Note ? ?Notified by PMT/Shae Elicia Lamp of patient/family request of [facility] Paliative services. ? ?[facility] hospital liaison will follow patient for discharge disposition.  ? ?Please call with any questions/concerns.  ?  ?Thank you for the opportunity to participate in this patient's care. ?  ?Daphene Calamity, MSW ?[place]  ?[phone] ? ?

## 2022-04-19 NOTE — Progress Notes (Signed)
RT found pt with O2 sats in the 60s on 8L [place]. Increased O2 and placed pt on NRB. O2 sats came up to 90%. RN notified and RN was notifying NP.  ?

## 2022-04-20 DIAGNOSIS — Z7189 Other specified counseling: Secondary | ICD-10-CM

## 2022-04-20 LAB — CBC
HCT: 37.9 % (ref 36.0–46.0)
Hemoglobin: 11.4 g/dL — ABNORMAL LOW (ref 12.0–15.0)
MCH: 31.1 pg (ref 26.0–34.0)
MCHC: 30.1 g/dL (ref 30.0–36.0)
MCV: 103.3 fL — ABNORMAL HIGH (ref 80.0–100.0)
Platelets: 258 10*3/uL (ref 150–400)
RBC: 3.67 MIL/uL — ABNORMAL LOW (ref 3.87–5.11)
RDW: 16.5 % — ABNORMAL HIGH (ref 11.5–15.5)
WBC: 5.5 10*3/uL (ref 4.0–10.5)
nRBC: 0 % (ref 0.0–0.2)

## 2022-04-20 LAB — CREATININE, SERUM
Creatinine, Ser: 0.76 mg/dL (ref 0.44–1.00)
GFR, Estimated: >60 mL/min (ref >60)

## 2022-04-20 LAB — BASIC METABOLIC PANEL
Anion gap: 7 (ref 5–15)
BUN: 42 mg/dL — ABNORMAL HIGH (ref 8–23)
CO2: 33 mmol/L — ABNORMAL HIGH (ref 22–32)
Calcium: 8.4 mg/dL — ABNORMAL LOW (ref 8.9–10.3)
Chloride: 99 mmol/L (ref 98–111)
Creatinine, Ser: 0.74 mg/dL (ref 0.44–1.00)
GFR, Estimated: >60 mL/min (ref >60)
Glucose, Bld: 110 mg/dL — ABNORMAL HIGH (ref 70–99)
Potassium: 6.2 mmol/L — ABNORMAL HIGH (ref 3.5–5.1)
Sodium: 139 mmol/L (ref 135–145)

## 2022-04-20 LAB — BODY FLUID CULTURE W GRAM STAIN: Culture: NO GROWTH

## 2022-04-20 LAB — BRAIN NATRIURETIC PEPTIDE: B Natriuretic Peptide: >4500 pg/mL — ABNORMAL HIGH (ref 0.0–100.0)

## 2022-04-20 LAB — POTASSIUM: Potassium: 6.1 mmol/L — ABNORMAL HIGH (ref 3.5–5.1)

## 2022-04-20 MED ORDER — DEXTROSE 50 % IV SOLN
25.0000 g | Freq: Once | INTRAVENOUS | Status: AC
  Administered 2022-04-20: 25 g via INTRAVENOUS
  Filled 2022-04-20: qty 50

## 2022-04-20 MED ORDER — MORPHINE SULFATE (PF) 2 MG/ML IV SOLN
2.0000 mg | INTRAVENOUS | Status: DC | PRN
  Administered 2022-04-20: 2 mg via INTRAVENOUS
  Filled 2022-04-20: qty 1

## 2022-04-20 MED ORDER — INSULIN ASPART 100 UNIT/ML IV SOLN
5.0000 [IU] | Freq: Once | INTRAVENOUS | Status: AC
  Administered 2022-04-20: 5 [IU] via INTRAVENOUS
  Filled 2022-04-20: qty 0.05

## 2022-04-20 MED ORDER — SODIUM ZIRCONIUM CYCLOSILICATE 10 G PO PACK
10.0000 g | PACK | Freq: Every day | ORAL | Status: DC
  Administered 2022-04-20: 10 g via ORAL
  Filled 2022-04-20: qty 1

## 2022-04-20 MED ORDER — ALPRAZOLAM 0.25 MG PO TABS
0.2500 mg | ORAL_TABLET | Freq: Three times a day (TID) | ORAL | Status: DC | PRN
  Administered 2022-04-20: 0.25 mg via ORAL
  Filled 2022-04-20: qty 1

## 2022-04-20 MED ORDER — MORPHINE SULFATE (PF) 2 MG/ML IV SOLN
1.0000 mg | INTRAVENOUS | Status: DC | PRN
  Administered 2022-04-20: 1 mg via INTRAVENOUS
  Filled 2022-04-20: qty 1

## 2022-04-23 ENCOUNTER — Ambulatory Visit: Payer: PPO | Admitting: Family

## 2022-05-01 ENCOUNTER — Ambulatory Visit: Payer: PPO

## 2022-05-08 ENCOUNTER — Ambulatory Visit: Payer: PPO | Admitting: Family

## 2022-05-10 NOTE — Progress Notes (Signed)
?   May 19, 2022 1500  ?Clinical Encounter Type  ?Visited With Patient and family together  ?Visit Type Initial;Patient actively dying  ?Referral From Nurse  ?Spiritual Encounters  ?Spiritual Needs Prayer;Emotional  ? ?Chaplain responded to Spiritual Care consult to provide EOL care for patient and visitor. Chaplain supported both through prayer and presence. ?

## 2022-05-10 NOTE — Progress Notes (Addendum)
[facility] [facility]) Hospital Liaison Note ? ?Received request from MD/Dr. Kurtis Bushman for family interest in [place]. Visited patient at bedside and spoke with son/Mark patient/family declined hospice services at this time. ? ?Contact information provided to family in the event family decides to pursue [facility].  ? ?Please do not hesitate to call with any hospice related questions.  ?  ?Thank you for the opportunity to participate in this patient's care. ? ?Daphene Calamity, MSW ?[place]  ?[phone] ? ?

## 2022-05-10 NOTE — Care Management Important Message (Signed)
Important Message ? ?Patient Details  ?Name: Jennifer Skinner ?MRN: [id number] ?Date of Birth: 1931/11/11 ? ? ?Medicare Important Message Given:  Yes ? ? ? ? ?Dannette Barbara ?04-23-22, 12:04 PM ?

## 2022-05-10 NOTE — Progress Notes (Signed)
?PROGRESS NOTE ? ? ? ?Jennifer Skinner  CXK:[id number] DOB: 1930/12/12 DOA: 04/19/2022 ?PCP: Olin Hauser, DO  ? ? ?Brief Narrative:  ?Jennifer Skinner is a 88 year old female with neuropathy, hypothyroid, hypertension, pulmonary hypertension, hyperlipidemia, COPD on 4 L nasal cannula, GERD, who presents emergency department from [place] facility for chief concerns of shortness of breath. ?She had a significant hypoxemia with oxygen saturation at 77%.  She was placed on 100% oxygen with nonrebreather.  She also had significant elevation of BNP, started IV Lasix for acute on chronic diastolic congestive heart failure. ?  ? ?5/11 mildly sob sitting in bed. Son at bedside.  He wants to proceed with CT renal.  He spoke to patient and he wants this to be done while inpatient. ? ?5/12 discussed the results of CT scan, echocardiogram and patient's status with son.  He states that he has had this discussion with mom and is time for her to pass away and be with his father.  He agreed initially to hospice house but apparently patient did not want to move because she was tired and has opted to keep patient here under comfort measures. Discussed with son its hard to say when she would pass away, may not be right away and he verbalizes understanding. Will change status to comfort care for now. ? ?Consultants:  ? ? ?Procedures:  ? ?Antimicrobials:  ?  ? ? ?Subjective: ?No abd pain today, no n/v ? ?Objective: ?Vitals:  ? 26-Apr-2022 0723 04/26/2022 0757 Apr 26, 2022 0830 Apr 26, 2022 1100  ?BP: (!) 97/44 (!) 104/51  (!) 104/51  ?Pulse: 96 97  96  ?Resp: 18 17    ?Temp:  97.9 ?F (36.6 ?C)  98.3 ?F (36.8 ?C)  ?TempSrc:      ?SpO2: 90% 99% 95% 95%  ?Weight:      ? ? ?Intake/Output Summary (Last 24 hours) at 26-Apr-2022 1538 ?Last data filed at 2022/04/26 9702 ?Gross per 24 hour  ?Intake 60 ml  ?Output 600 ml  ?Net -540 ml  ? ?Filed Weights  ? 04/19/22 0541  ?Weight: 36.6 kg  ? ? ?Examination: ?Frail, tired ?Decreae bs bases,  r;honchorus ?Reg s1/s2 no gallop ?Soft benign +bs ?No edema ?Grossly intact ?Mood and affect appropriate in current setting  ? ? ?Data Reviewed: I have personally reviewed following labs and imaging studies ? ?CBC: ?Recent Labs  ?Lab 04/14/22 ?0614 04/19/2022 ?2037 04/17/22 ?6378 Apr 26, 2022 ?0940  ?WBC 5.0 5.9 6.1 5.5  ?NEUTROABS  --  4.6  --   --   ?HGB 12.6 12.4 11.5* 11.4*  ?HCT 41.6 40.9 37.4 37.9  ?MCV 100.7* 102.5* 99.7 103.3*  ?PLT 251 245 223 258  ? ?Basic Metabolic Panel: ?Recent Labs  ?Lab 04/14/22 ?0614 04/18/2022 ?2037 04/17/22 ?5885 04/18/22 ?0277 04-26-22 ?0617 04/26/22 ?0940  ?NA 141 138 140 140  --  139  ?K 3.2* 4.1 3.7 3.4* 6.1* 6.2*  ?CL 101 97* 99 99  --  99  ?CO2 34* 35* 35* 36*  --  33*  ?GLUCOSE 78 118* 104* 97  --  110*  ?BUN 34* 27* 23 19  --  42*  ?CREATININE 0.66 0.64 0.63 0.51 0.76 0.74  ?CALCIUM 8.2* 8.6* 8.3* 8.4*  --  8.4*  ?MG  --   --   --  2.4  --   --   ? ?GFR: ?Estimated Creatinine Clearance: 26.1 mL/min (by C-G formula based on SCr of 0.74 mg/dL). ?Liver Function Tests: ?Recent Labs  ?Lab 04/21/2022 ?  2037  ?AST 46*  ?ALT 33  ?ALKPHOS 80  ?BILITOT 0.4  ?PROT 6.3*  ?ALBUMIN 2.7*  ? ?No results for input(s): LIPASE, AMYLASE in the last 168 hours. ?No results for input(s): AMMONIA in the last 168 hours. ?Coagulation Profile: ?No results for input(s): INR, PROTIME in the last 168 hours. ? ?Cardiac Enzymes: ?No results for input(s): CKTOTAL, CKMB, CKMBINDEX, TROPONINI in the last 168 hours. ?BNP (last 3 results) ?No results for input(s): PROBNP in the last 8760 hours. ?HbA1C: ?No results for input(s): HGBA1C in the last 72 hours. ?CBG: ?No results for input(s): GLUCAP in the last 168 hours. ?Lipid Profile: ?No results for input(s): CHOL, HDL, LDLCALC, TRIG, CHOLHDL, LDLDIRECT in the last 72 hours. ?Thyroid Function Tests: ?No results for input(s): TSH, T4TOTAL, FREET4, T3FREE, THYROIDAB in the last 72 hours. ?Anemia Panel: ?No results for input(s): VITAMINB12, FOLATE, FERRITIN, TIBC, IRON,  RETICCTPCT in the last 72 hours. ?Sepsis Labs: ?Recent Labs  ?Lab 04/21/2022 ?2037 04/17/22 ?2250  ?PROCALCITON 0.19  --   ?LATICACIDVEN 1.2 0.8  ? ? ?Recent Results (from the past 240 hour(s))  ?Body fluid culture w Gram Stain     Status: None  ? Collection Time: 04/17/22  9:20 AM  ? Specimen: PATH Cytology Pleural fluid  ?Result Value Ref Range Status  ? Specimen Description   Final  ?  PLEURAL ?Performed at [facility], [address] [place], [place] [address] ?  ? Special Requests   Final  ?  NONE ?Performed at [facility], [address], [place], [place] [address] ?  ? Gram Stain   Final  ?  WBC PRESENT, PREDOMINANTLY PMN ?NO ORGANISMS SEEN ?CYTOSPIN SMEAR ?  ? Culture   Final  ?  NO GROWTH 3 DAYS ?Performed at [facility], [place] [address] [place], [place] [address] ?  ? Report Status May 16, 2022 FINAL  Final  ?  ? ? ? ? ? ?Radiology Studies: ?CT RENAL ABD W/WO ? ?Result Date: 04/19/2022 ?CLINICAL DATA:  Follow-up renal mass seen on prior chest CT. * Tracking Code: BO * EXAM: CT ABDOMEN WITHOUT AND WITH CONTRAST TECHNIQUE: Multidetector CT imaging of the abdomen was performed following the standard protocol before and following the bolus administration of intravenous contrast. RADIATION DOSE REDUCTION: This exam was performed according to the departmental dose-optimization program which includes automated exposure control, adjustment of the mA and/or kV according to patient size and/or use of iterative reconstruction technique. CONTRAST:  66m OMNIPAQUE IOHEXOL 300 MG/ML  SOLN COMPARISON:  Chest CT Apr 16, 2022 and MRI abdomen June 06, 2010 FINDINGS: Lower chest: Cardiomegaly. Moderate bilateral pleural effusions with adjacent consolidations. Hepatobiliary: Bilobar hepatic lesions for instance in the inferior right lobe of the liver measuring 15 mm on image 64/6, in the posterior right lobe of the liver measuring 13 mm on image 49/6 in the anterior liver measuring 17 mm on image  23/11, these were better visualized on prior MRI of the abdomen dated June 06, 2010 and characterized as benign hepatic hemangiomas. No definite new suspicious hepatic lesion identified. Gallbladder is unremarkable. No biliary ductal dilation. Pancreas: Prominence of the pancreatic duct, not dilated by size criteria is favored senescent change. No pancreatic atrophy or convincing evidence of acute inflammation. Spleen: No splenomegaly or focal splenic lesion. Adrenals/Urinary Tract: Bilateral adrenal glands are within normal limits. No hydronephrosis. Solid hyperenhancing 2.4 cm left interpolar renal lesion on image 48/6 is consistent with a renal cell carcinoma. Hypodense subcentimeter bilateral renal lesions  are technically too small to accurately characterize but statistically likely to reflect cysts, which in the absence of clinically indicated signs/symptoms require no independent follow-up. Stomach/Bowel: No radiopaque enteric contrast material was administered. Stomach is unremarkable for degree of distension. Large bowel is diffusely distended with formed stool. No evidence of small-bowel obstruction. Vascular/Lymphatic: Aortic and branch vessel atherosclerosis without abdominal aortic aneurysm. The renal veins appear patent. Smooth IVC contours. No pathologically enlarged abdominal lymph nodes. Other: Small volume abdominal ascites.  Anasarca.  Cachexia. Musculoskeletal: Diffuse demineralization of bone. Numerous age indeterminate vertebral body compression deformities involving the T11 through L4 vertebral bodies with varying degrees of height loss most prominent at T12 with approximately 60% height loss. IMPRESSION: 1. Solid hyperenhancing 2.4 cm left interpolar renal lesion consistent with a renal cell carcinoma. 2. No convincing evidence of renal tumor in vein or abdominal metastatic disease. 3. Enhancing bilobar hepatic lesions previously characterized as hemangiomas on MRI June 06, 2010. 4. Cachectic  appearance with small volume abdominal ascites, anasarca, moderate bilateral pleural effusions and adjacent consolidations, in the setting of cardiomegaly this likely reflects sequela of a combination of CHF and hy

## 2022-05-10 NOTE — Plan of Care (Signed)
Son is ready to move mom to  comfort care - Please place and adjust orders when able.  I did specify " Do you also wish to remove O2.  He said "yes, she has no quality of life and is ready to go."  Dr. Informed and will place/adjust orders as able. ?

## 2022-05-10 NOTE — TOC Progression Note (Addendum)
Transition of Care (TOC) - Progression Note  ? ? ?Patient Details  ?Name: Jennifer Skinner ?MRN: [id number] ?Date of Birth: [birth year] ? ?Transition of Care (TOC) CM/SW Contact  ?Laurena Slimmer, RN ?Phone Number: ?05-17-22, 9:33 AM ? ?Clinical Narrative:    ?Spoke with Tammy, Representative for [facility]. Tammy will begin authorization. Attempt to contact Ricy at [facility] to see if they would accept a weekend discharge. No answer. Left a message.  ? ? ?  ?  ? ?Expected Discharge Plan and Services ?  ?  ?  ?  ?  ?                ?  ?  ?  ?  ?  ?  ?  ?  ?  ?  ? ? ?Social Determinants of Health (SDOH) Interventions ?  ? ?Readmission Risk Interventions ?   ? View : No data to display.  ?  ?  ?  ? ? ?

## 2022-05-10 NOTE — Progress Notes (Addendum)
? ?                                                                                                                                                     ?                                                   ?Daily Progress Note  ? ?Patient Name: Jennifer Skinner       Date: May 17, 2022 ?DOB: 1931-08-23  Age: 88 y.o. MRN#: [id number] ?Attending Physician: Nolberto Hanlon, MD ?Primary Care Physician: Olin Hauser, DO ?Admit Date: 04/15/2022 ? ?Reason for Consultation/Follow-up: Establishing goals of care ? ?Subjective: ?Notified by staff that patient wants comfort care. This patient was initially seen by a PMT team member with recommendations for outpatient palliative at SNF. [facility] had set up services to follow at D/C.  ? ?With this new information, recommended [facility] to re-assess for hospice candidacy. In to see patient and family. Patient appears uncomfortable and a bit dyspneic, but denies SOB. She complains of back pain which improved with repositioning. Discussed details of comfort care, and discussed O2 use options including continuing O2 with comfort meds, decreasing O2 with comfort meds, or completely liberating herself from O2 with comfort meds, if she chooses. Discussed prognosis depending on her wishes. Patient states she is ready to go be with her husband (who is deceased) but worries about her children. Son assures her the family will be okay. [facility] in to speak with patient and family about options.  ? ?Notified by [facility] patient does not want hospice placement and wants to die at [facility]. Spoke with son. He states she will want full comfort care and to die in the hospital. He states there are family members she will need to talk to and he plans to celebrate Mother's Day with her today. He will let attending team know when they are ready to shift to comfort care.  ? ?Length of Stay: 3 ? ?Current Medications: ?Scheduled Meds:  ? aspirin EC  81 mg Oral Daily  ? insulin aspart  5 Units  Intravenous Once  ? And  ? dextrose  25 g Intravenous Once  ? enoxaparin (LOVENOX) injection  30 mg Subcutaneous Q24H  ? feeding supplement  237 mL Oral BID BM  ? furosemide  20 mg Intravenous Q12H  ? gabapentin  100 mg Oral TID  ? levothyroxine  25 mcg Oral Q0600  ? rosuvastatin  10 mg Oral QHS  ? sodium zirconium cyclosilicate  10 g Oral Daily  ? ? ?Continuous Infusions: ? ? ?PRN Meds: ?alum & mag hydroxide-simeth, ondansetron **OR** ondansetron (ZOFRAN) IV, polyethylene glycol, traMADol ? ?Physical Exam ?Pulmonary:  ?  Effort: Pulmonary effort is normal.  ?Neurological:  ?   Mental Status: She is alert.  ?         ? ?Vital Signs: BP (!) 104/51 (BP Location: Left Arm)   Pulse 96   Temp 98.3 ?F (36.8 ?C)   Resp 17   Wt 36.6 kg   SpO2 95%   BMI 18.75 kg/m?  ?SpO2: SpO2: 95 % ?O2 Device: O2 Device: Nasal Cannula ?O2 Flow Rate: O2 Flow Rate (L/min): 6 L/min ? ?Intake/output summary:  ?Intake/Output Summary (Last 24 hours) at 2022/05/12 1236 ?Last data filed at May 12, 2022 7341 ?Gross per 24 hour  ?Intake 60 ml  ?Output 600 ml  ?Net -540 ml  ? ?LBM: Last BM Date : 04/15/2022 ?Baseline Weight: Weight: 36.6 kg ?Most recent weight: Weight: 36.6 kg ?Flowsheet Rows   ? ?Flowsheet Row Most Recent Value  ?Intake Tab   ?Referral Department Hospitalist  ?Unit at Time of Referral Intermediate Care Unit  ?Palliative Care Primary Diagnosis Cardiac  ?Date Notified 04/17/22  ?Palliative Care Type New Palliative care  ?Reason for referral Clarify Goals of Care  ?Date of Admission 04/13/2022  ?Date first seen by Palliative Care 04/17/22  ?# of days Palliative referral response time 0 Day(s)  ?# of days IP prior to Palliative referral 1  ?Clinical Assessment   ?Psychosocial & Spiritual Assessment   ?Palliative Care Outcomes   ? ?  ? ? ?Patient Active Problem List  ? Diagnosis Date Noted  ? Malnutrition of moderate degree 04/19/2022  ? Pressure injury of skin 04/19/2022  ? Pleural effusion due to CHF (congestive heart failure) ([place])  04/17/2022  ? Acute on chronic diastolic (congestive) heart failure ([place]) 04/17/2022  ? Acute exacerbation of CHF (congestive heart failure) ([place]) 04/18/2022  ? Unable to ambulate 04/13/2022  ? Dyslipidemia 04/13/2022  ? Pulmonary hypertension ([place]) 04/13/2022  ? Acute on chronic diastolic CHF (congestive heart failure) ([place]) 04/13/2022  ? Peripheral neuropathy 04/13/2022  ? Acute on chronic respiratory failure with hypoxia ([place]) 04/13/2022  ? Pelvic floor weakness in female 11/16/2021  ? Cystocele with prolapse 11/16/2021  ? Chronic left hip pain 07/02/2019  ? Primary osteoarthritis of both hips 07/02/2019  ? OAB (overactive bladder) 06/10/2018  ? Elevated hemoglobin A1c 03/07/2018  ? Protein-calorie malnutrition, severe ([place]) 01/10/2018  ? Pulmonary HTN ([place]) 01/08/2018  ? Lymphedema 10/28/2017  ? Hyperlipidemia 10/08/2017  ? Chronic back pain 08/26/2017  ? Other constipation 08/19/2017  ? Essential hypertension 03/13/2016  ? COPD (chronic obstructive pulmonary disease) ([place])   ? Chronic diastolic CHF (congestive heart failure) ([place])   ? 1st degree AV block   ? Interstitial lung disease ([place]) 10/17/2015  ? Aspiration pneumonia of left lower lobe ([place]) 10/10/2015  ? Hypothyroidism 09/27/2015  ? Lumbar and sacral osteoarthritis 03/11/2015  ? Degeneration of intervertebral disc of lumbar region 07/13/2014  ? Lumbar radiculitis 07/13/2014  ? Lumbar stenosis with neurogenic claudication 07/13/2014  ? History of neutropenia 03/31/2013  ? ION (ischemic optic neuropathy) 09/30/2012  ? OP (osteoporosis) 09/30/2012  ? MITRAL REGURGITATION 04/12/2009  ? Mitral valve disorder 04/12/2009  ? GERD 04/12/2009  ? ? ?Palliative Care Assessment & Plan  ? ?Recommendations/Plan: ? ?Patient will want to shift to full comfort care and die in hospital. Son advises there are family members she will need to speak with, and he intends to celebrate Mother's Day with her this afternoon. He will let staff and attending team know when they are  ready to shift to full comfort  care.  ? ? ? ?Code Status: ? ?  ?Code Status Orders  ?(From admission, onward)  ?  ? ? ?  ? ?  Start     Ordered  ? 04/17/22 0017  Do not attempt resuscitation (DNR)  Continuous       ?Question Answer Comment  ?In the event of cardiac or respiratory ARREST Do not call a ?code blue?   ?In the event of cardiac or respiratory ARREST Do not perform Intubation, CPR, defibrillation or ACLS   ?In the event of cardiac or respiratory ARREST Use medication by any route, position, wound care, and other measures to relive pain and suffering. May use oxygen, suction and manual treatment of airway obstruction as needed for comfort.   ?  ? 04/17/22 0016  ? ?  ?  ? ?  ? ?Code Status History   ? ? Date Active Date Inactive Code Status Order ID Comments User Context  ? 04/28/2022 2321 04/17/2022 0016 Full Code [id number]  Criss Alvine, DO ED  ? 04/13/2022 2126 04/15/2022 1952 DNR [id number]  Sidney Ace Arvella Merles, MD Inpatient  ? 04/13/2022 1917 04/13/2022 2126 Full Code [id number]  Christel Mormon, MD ED  ? 01/07/2018 1633 01/11/2018 1658 Full Code [id number]  Bettey Costa, MD ED  ? 10/11/2015 0432 10/13/2015 1433 Full Code [id number]  Demetrios Loll, MD Inpatient  ? ?  ? ? ?Prognosis: ? Hours - Days ? ?Care plan was discussed with primary team and RN on epic chat.  ? ?Thank you for allowing the Palliative Medicine Team to assist in the care of this patient. ? ?Asencion Gowda, NP ? ?Please contact Palliative Medicine Team phone at [phone] for questions and concerns.  ? ? ? ? ? ?

## 2022-05-10 NOTE — Discharge Summary (Signed)
?Death Summary  ?Jennifer Skinner NWG:[id number] DOB: 1931-07-09 DOA: May 11, 2022 ? ?PCP: Olin Hauser, DO ? ?Admit date: May 11, 2022 ?Date of Death: 05/15/2022 ?Time of Death: 18:30 ?Notification: Olin Hauser, DO notified of death of May 16, 2022 ? ? ?History of present illness:  ?[name] is a 88 year old female with neuropathy, hypothyroid, hypertension, pulmonary hypertension, hyperlipidemia, COPD on 4 L nasal cannula, GERD, who presents emergency department from [facility] facility for chief concerns of shortness of breath. ?She had a significant hypoxemia with oxygen saturation at 77%.  She was placed on 100% oxygen with nonrebreather.  She also had significant elevation of BNP, started IV Lasix for acute on chronic diastolic congestive heart failure. She had poor appetite. Her symptoms did not improve.  She had pleural effusion ,status pos tthoracentesis.  Repeat imaging revealed reaccumulation of her pleural effusion.  She was also found with renal cell carcinoma.  she has severe MR that was not surgical candidate. Spoke to son, who decided to make patient comfort care only, and patient shortly passed away ? ? ?Final Diagnoses:  ?1.   Acute on chronic diastolic heart failure ?2.Acute on chronic hypoxemic respiratory failure ?3.  Renal cell carcinoma ?4.  Severe mitral regurgitation ?5.  Protein caloric malnutrition, severe ?6.  Pleural effusion ?7 ascites ?8 pressure injury of skin ?9 aspiration pneumonia ?10 pulmonary hypertension ?11 hypotension ?12 essential hypertension ?13 hypokalemia ?-14 hyperkalemia ?15 hypocalcemia ?16 macrocytic anemia ?17 vertebral bodies compression deformities ? ? ?The results of significant diagnostics from this hospitalization (including imaging, microbiology, ancillary and laboratory) are listed below for reference.   ? ?Significant Diagnostic Studies: ?DG Chest 1 View ? ?Result Date: 04/13/2022 ?CLINICAL DATA:  Shortness of breath and leg swelling.  EXAM: CHEST  1 VIEW COMPARISON:  Radiograph 01/07/2018 FINDINGS: Chronic cardiomegaly. Unchanged mediastinal contours with aortic atherosclerosis and tortuosity. Increased right pleural effusion and basilar opacity from 2019 exam. There is a small left pleural effusion with streaky left lung base opacities. Vascular congestion with possible central perihilar edema. No pneumothorax. Remote left rib fractures. The bones are under mineralized IMPRESSION: 1. Chronic cardiomegaly. Increased right pleural effusion and basilar opacity from 2019 exam, likely combination of pleural fluid and atelectasis. 2. Small left pleural effusion with streaky left lung base opacities, likely atelectasis. 3. Vascular congestion with possible central perihilar edema. Electronically Signed   By: Keith Rake M.D.   On: 04/13/2022 17:16  ? ?CT Angio Chest PE W and/or Wo Contrast ? ?Result Date: 05-11-22 ?CLINICAL DATA:  Pulmonary embolism (PE) suspected, high prob. Hypoxia. EXAM: CT ANGIOGRAPHY CHEST WITH CONTRAST TECHNIQUE: Multidetector CT imaging of the chest was performed using the standard protocol during bolus administration of intravenous contrast. Multiplanar CT image reconstructions and MIPs were obtained to evaluate the vascular anatomy. RADIATION DOSE REDUCTION: This exam was performed according to the departmental dose-optimization program which includes automated exposure control, adjustment of the mA and/or kV according to patient size and/or use of iterative reconstruction technique. CONTRAST:  43m OMNIPAQUE IOHEXOL 350 MG/ML SOLN COMPARISON:  01/08/2018 FINDINGS: Cardiovascular: Moderate cardiomegaly, stable since prior examination. Minimal coronary artery calcification. No pericardial effusion. Central pulmonary arteries are enlarged in keeping with changes of pulmonary arterial hypertension. Mild atherosclerotic calcification within the thoracic aorta. No aortic aneurysm. Mediastinum/Nodes: No enlarged mediastinal,  hilar, or axillary lymph nodes. Thyroid gland, trachea, and esophagus demonstrate no significant findings. Lungs/Pleura: Large right and moderate left pleural effusions are present, enlarged since prior examination, with near complete collapse of  the right middle and lower lobe and compressive atelectasis of the left lower lobe. Centrilobular emphysema again noted. No central obstructing mass. No pneumothorax. Upper Abdomen: Mild ascites is again seen. Indeterminate low-attenuation lesion within the right hepatic lobe is better characterized as a simple cyst on prior MRI examination of 06/06/2010. 2.4 cm enhancing mass is seen within the lateral cortex of the left kidney in keeping with a primary renal malignancy, new since prior MRI examination. Musculoskeletal: Multiple remote bilateral rib fractures are identified. Remote T9 compression deformity is present, stable since prior examination. Review of the MIP images confirms the above findings. IMPRESSION: No acute pulmonary embolism. Moderate cardiomegaly. Morphologic changes in keeping with pulmonary arterial hypertension. Enlarging bilateral pleural effusions, right greater than left, with compressive atelectasis of the lung bases bilaterally, right greater than left. Emphysema 2.4 cm enhancing mass within the left kidney most in keeping with a primary renal neoplasm. This would be better assessed with dedicated renal mass protocol CT or MRI examination, if indicated. Mild ascites Aortic Atherosclerosis (ICD10-I70.0) and Emphysema (ICD10-J43.9). Electronically Signed   By: Fidela Salisbury M.D.   On: 04/10/2022 22:39  ? ?DG Chest Port 1 View ? ?Result Date: 04/17/2022 ?CLINICAL DATA:  Pneumothorax EXAM: PORTABLE CHEST 1 VIEW COMPARISON:  Earlier today FINDINGS: No definite or increasing pneumothorax on the right. Unchanged reticular pulmonary opacities with small pleural effusions. Cardiopericardial enlargement. IMPRESSION: No definite or increasing pneumothorax.   Stable aeration. Electronically Signed   By: Jorje Guild M.D.   On: 04/17/2022 11:46  ? ?DG Chest Port 1 View ? ?Result Date: 04/17/2022 ?CLINICAL DATA:  Right thoracentesis. EXAM: PORTABLE CHEST 1 VIEW COMPARISON:  Chest x-ray May 5, 23. FINDINGS: Similar enlargement the cardiac silhouette. No mediastinal shift. Decreased right pleural effusion, now trace. Suspected trace right pneumothorax. Increased left basilar opacities. IMPRESSION: 1. Decreased right pleural effusion status post thoracentesis. Suspected trace right pneumothorax. No evidence of tension. 2. Increased left basilar opacities, which could represent atelectasis and/or pneumonia. These results will be called to the ordering clinician or representative by the Radiologist Assistant, and communication documented in the PACS or [employer]. Electronically Signed   By: Margaretha Sheffield M.D.   On: 04/17/2022 10:11  ? ?DG Knee Complete 4 Views Left ? ?Result Date: 04/12/2022 ?CLINICAL DATA:  Chronic left knee pain, and notes unable to bear much weight. EXAM: LEFT KNEE - COMPLETE 4+ VIEW COMPARISON:  None Available. FINDINGS: Acute to subacute minimally displaced fracture involving the fibular head and neck. Soft tissue swelling. No dislocation IMPRESSION: Acute to subacute minimally displaced fracture involving the fibular head and neck. These results will be called to the ordering clinician or representative by the Radiologist Assistant, and communication documented in the PACS or [employer]. Electronically Signed   By: Donavan Foil M.D.   On: 04/12/2022 21:48  ? ?CT RENAL ABD W/WO ? ?Result Date: 04/19/2022 ?CLINICAL DATA:  Follow-up renal mass seen on prior chest CT. * Tracking Code: BO * EXAM: CT ABDOMEN WITHOUT AND WITH CONTRAST TECHNIQUE: Multidetector CT imaging of the abdomen was performed following the standard protocol before and following the bolus administration of intravenous contrast. RADIATION DOSE REDUCTION: This exam was  performed according to the departmental dose-optimization program which includes automated exposure control, adjustment of the mA and/or kV according to patient size and/or use of iterative reconstruction techn

## 2022-05-10 NOTE — Progress Notes (Signed)
PT Cancellation Note ? ?Patient Details ?Name: BRENTLEE SCIARA ?MRN: [id number] ?DOB: 11-Jan-1931 ? ? ?Cancelled Treatment:    Reason Eval/Treat Not Completed: Other (comment) Per secure chat, MD states to d/c PT orders as pt needs hospice. Will complete current PT orders. ? ?Lavone Nian, PT, DPT ?04/24/2022, 12:36 PM ? ? ?Waunita Schooner ?04-24-22, 12:36 PM ?

## 2022-05-10 NOTE — Discharge Summary (Incomplete)
?Death Summary  ?Jennifer Skinner EHM:[id number] DOB: 1931/08/06 DOA: Apr 28, 2022 ? ?PCP: Olin Hauser, DO ? ?Admit date: 2022-04-28 ?Date of Death: May 02, 2022 ?Time of Death: *** ?Notification: Olin Hauser, DO notified of death of 05-02-2022 ? ? ?History of present illness:  ?Jennifer Skinner is a 88 y.o. female with a history of  ?Jennifer Skinner presented with complaint of  ?Jennifer Skinner did not improve after ***(very brief description of intervention) ?(type brief HPI or brief interim summary & hospital course depending on length of stay; Was pt made DNR/comfort care?) ? ?Final Diagnoses:  ?1.   *** ? ? ?The results of significant diagnostics from this hospitalization (including imaging, microbiology, ancillary and laboratory) are listed below for reference.   ? ?Significant Diagnostic Studies: ?DG Chest 1 View ? ?Result Date: 04/13/2022 ?CLINICAL DATA:  Shortness of breath and leg swelling. EXAM: CHEST  1 VIEW COMPARISON:  Radiograph 01/07/2018 FINDINGS: Chronic cardiomegaly. Unchanged mediastinal contours with aortic atherosclerosis and tortuosity. Increased right pleural effusion and basilar opacity from 2019 exam. There is a small left pleural effusion with streaky left lung base opacities. Vascular congestion with possible central perihilar edema. No pneumothorax. Remote left rib fractures. The bones are under mineralized IMPRESSION: 1. Chronic cardiomegaly. Increased right pleural effusion and basilar opacity from 2019 exam, likely combination of pleural fluid and atelectasis. 2. Small left pleural effusion with streaky left lung base opacities, likely atelectasis. 3. Vascular congestion with possible central perihilar edema. Electronically Signed   By: Keith Rake M.D.   On: 04/13/2022 17:16  ? ?CT Angio Chest PE W and/or Wo Contrast ? ?Result Date: 04/28/22 ?CLINICAL DATA:  Pulmonary embolism (PE) suspected, high prob. Hypoxia. EXAM: CT ANGIOGRAPHY CHEST WITH CONTRAST TECHNIQUE:  Multidetector CT imaging of the chest was performed using the standard protocol during bolus administration of intravenous contrast. Multiplanar CT image reconstructions and MIPs were obtained to evaluate the vascular anatomy. RADIATION DOSE REDUCTION: This exam was performed according to the departmental dose-optimization program which includes automated exposure control, adjustment of the mA and/or kV according to patient size and/or use of iterative reconstruction technique. CONTRAST:  46m OMNIPAQUE IOHEXOL 350 MG/ML SOLN COMPARISON:  01/08/2018 FINDINGS: Cardiovascular: Moderate cardiomegaly, stable since prior examination. Minimal coronary artery calcification. No pericardial effusion. Central pulmonary arteries are enlarged in keeping with changes of pulmonary arterial hypertension. Mild atherosclerotic calcification within the thoracic aorta. No aortic aneurysm. Mediastinum/Nodes: No enlarged mediastinal, hilar, or axillary lymph nodes. Thyroid gland, trachea, and esophagus demonstrate no significant findings. Lungs/Pleura: Large right and moderate left pleural effusions are present, enlarged since prior examination, with near complete collapse of the right middle and lower lobe and compressive atelectasis of the left lower lobe. Centrilobular emphysema again noted. No central obstructing mass. No pneumothorax. Upper Abdomen: Mild ascites is again seen. Indeterminate low-attenuation lesion within the right hepatic lobe is better characterized as a simple cyst on prior MRI examination of 06/06/2010. 2.4 cm enhancing mass is seen within the lateral cortex of the left kidney in keeping with a primary renal malignancy, new since prior MRI examination. Musculoskeletal: Multiple remote bilateral rib fractures are identified. Remote T9 compression deformity is present, stable since prior examination. Review of the MIP images confirms the above findings. IMPRESSION: No acute pulmonary embolism. Moderate  cardiomegaly. Morphologic changes in keeping with pulmonary arterial hypertension. Enlarging bilateral pleural effusions, right greater than left, with compressive atelectasis of the lung bases bilaterally, right greater than left. Emphysema 2.4 cm enhancing mass within the left  kidney most in keeping with a primary renal neoplasm. This would be better assessed with dedicated renal mass protocol CT or MRI examination, if indicated. Mild ascites Aortic Atherosclerosis (ICD10-I70.0) and Emphysema (ICD10-J43.9). Electronically Signed   By: Fidela Salisbury M.D.   On: 04/15/2022 22:39  ? ?DG Chest Port 1 View ? ?Result Date: 04/17/2022 ?CLINICAL DATA:  Pneumothorax EXAM: PORTABLE CHEST 1 VIEW COMPARISON:  Earlier today FINDINGS: No definite or increasing pneumothorax on the right. Unchanged reticular pulmonary opacities with small pleural effusions. Cardiopericardial enlargement. IMPRESSION: No definite or increasing pneumothorax.  Stable aeration. Electronically Signed   By: Jorje Guild M.D.   On: 04/17/2022 11:46  ? ?DG Chest Port 1 View ? ?Result Date: 04/17/2022 ?CLINICAL DATA:  Right thoracentesis. EXAM: PORTABLE CHEST 1 VIEW COMPARISON:  Chest x-ray May 5, 23. FINDINGS: Similar enlargement the cardiac silhouette. No mediastinal shift. Decreased right pleural effusion, now trace. Suspected trace right pneumothorax. Increased left basilar opacities. IMPRESSION: 1. Decreased right pleural effusion status post thoracentesis. Suspected trace right pneumothorax. No evidence of tension. 2. Increased left basilar opacities, which could represent atelectasis and/or pneumonia. These results will be called to the ordering clinician or representative by the Radiologist Assistant, and communication documented in the PACS or [employer]. Electronically Signed   By: Margaretha Sheffield M.D.   On: 04/17/2022 10:11  ? ?DG Knee Complete 4 Views Left ? ?Result Date: 04/12/2022 ?CLINICAL DATA:  Chronic left knee pain, and notes unable  to bear much weight. EXAM: LEFT KNEE - COMPLETE 4+ VIEW COMPARISON:  None Available. FINDINGS: Acute to subacute minimally displaced fracture involving the fibular head and neck. Soft tissue swelling. No dislocation IMPRESSION: Acute to subacute minimally displaced fracture involving the fibular head and neck. These results will be called to the ordering clinician or representative by the Radiologist Assistant, and communication documented in the PACS or [employer]. Electronically Signed   By: Donavan Foil M.D.   On: 04/12/2022 21:48  ? ?CT RENAL ABD W/WO ? ?Result Date: 04/19/2022 ?CLINICAL DATA:  Follow-up renal mass seen on prior chest CT. * Tracking Code: BO * EXAM: CT ABDOMEN WITHOUT AND WITH CONTRAST TECHNIQUE: Multidetector CT imaging of the abdomen was performed following the standard protocol before and following the bolus administration of intravenous contrast. RADIATION DOSE REDUCTION: This exam was performed according to the departmental dose-optimization program which includes automated exposure control, adjustment of the mA and/or kV according to patient size and/or use of iterative reconstruction technique. CONTRAST:  45m OMNIPAQUE IOHEXOL 300 MG/ML  SOLN COMPARISON:  Chest CT Apr 16, 2022 and MRI abdomen June 06, 2010 FINDINGS: Lower chest: Cardiomegaly. Moderate bilateral pleural effusions with adjacent consolidations. Hepatobiliary: Bilobar hepatic lesions for instance in the inferior right lobe of the liver measuring 15 mm on image 64/6, in the posterior right lobe of the liver measuring 13 mm on image 49/6 in the anterior liver measuring 17 mm on image 23/11, these were better visualized on prior MRI of the abdomen dated June 06, 2010 and characterized as benign hepatic hemangiomas. No definite new suspicious hepatic lesion identified. Gallbladder is unremarkable. No biliary ductal dilation. Pancreas: Prominence of the pancreatic duct, not dilated by size criteria is favored senescent  change. No pancreatic atrophy or convincing evidence of acute inflammation. Spleen: No splenomegaly or focal splenic lesion. Adrenals/Urinary Tract: Bilateral adrenal glands are within normal limits. No hydronephros

## 2022-05-10 DEATH — deceased

## 2022-05-15 ENCOUNTER — Ambulatory Visit: Payer: PPO

## 2022-06-19 ENCOUNTER — Ambulatory Visit: Payer: PPO

## 2022-06-26 ENCOUNTER — Ambulatory Visit: Payer: PPO

## 2022-06-29 ENCOUNTER — Ambulatory Visit: Payer: PPO
